# Patient Record
Sex: Female | Born: 1965 | Race: White | Hispanic: No | Marital: Married | Smoking: Never smoker
Health system: Southern US, Community
[De-identification: ages and names within clinical notes are randomized; demographics above are authoritative.]

## PROBLEM LIST (undated history)

## (undated) DIAGNOSIS — R609 Edema, unspecified: Secondary | ICD-10-CM

## (undated) DIAGNOSIS — N200 Calculus of kidney: Secondary | ICD-10-CM

## (undated) DIAGNOSIS — N281 Cyst of kidney, acquired: Secondary | ICD-10-CM

## (undated) DIAGNOSIS — I10 Essential (primary) hypertension: Secondary | ICD-10-CM

## (undated) DIAGNOSIS — R569 Unspecified convulsions: Secondary | ICD-10-CM

## (undated) DIAGNOSIS — E559 Vitamin D deficiency, unspecified: Secondary | ICD-10-CM

## (undated) DIAGNOSIS — F419 Anxiety disorder, unspecified: Secondary | ICD-10-CM

## (undated) DIAGNOSIS — F329 Major depressive disorder, single episode, unspecified: Secondary | ICD-10-CM

## (undated) DIAGNOSIS — E041 Nontoxic single thyroid nodule: Secondary | ICD-10-CM

## (undated) DIAGNOSIS — E669 Obesity, unspecified: Secondary | ICD-10-CM

## (undated) DIAGNOSIS — K219 Gastro-esophageal reflux disease without esophagitis: Secondary | ICD-10-CM

## (undated) DIAGNOSIS — M2550 Pain in unspecified joint: Secondary | ICD-10-CM

## (undated) DIAGNOSIS — J45909 Unspecified asthma, uncomplicated: Secondary | ICD-10-CM

## (undated) DIAGNOSIS — T8859XA Other complications of anesthesia, initial encounter: Secondary | ICD-10-CM

## (undated) DIAGNOSIS — J309 Allergic rhinitis, unspecified: Secondary | ICD-10-CM

## (undated) DIAGNOSIS — R112 Nausea with vomiting, unspecified: Secondary | ICD-10-CM

## (undated) DIAGNOSIS — Z87442 Personal history of urinary calculi: Secondary | ICD-10-CM

## (undated) DIAGNOSIS — G4700 Insomnia, unspecified: Secondary | ICD-10-CM

## (undated) DIAGNOSIS — R7303 Prediabetes: Secondary | ICD-10-CM

## (undated) DIAGNOSIS — C4490 Unspecified malignant neoplasm of skin, unspecified: Secondary | ICD-10-CM

## (undated) DIAGNOSIS — M797 Fibromyalgia: Secondary | ICD-10-CM

## (undated) DIAGNOSIS — Z9889 Other specified postprocedural states: Secondary | ICD-10-CM

## (undated) DIAGNOSIS — M1990 Unspecified osteoarthritis, unspecified site: Secondary | ICD-10-CM

## (undated) DIAGNOSIS — F32A Depression, unspecified: Secondary | ICD-10-CM

## (undated) DIAGNOSIS — T4145XA Adverse effect of unspecified anesthetic, initial encounter: Secondary | ICD-10-CM

## (undated) HISTORY — DX: Essential (primary) hypertension: I10

## (undated) HISTORY — DX: Calculus of kidney: N20.0

## (undated) HISTORY — DX: Cyst of kidney, acquired: N28.1

## (undated) HISTORY — DX: Edema, unspecified: R60.9

## (undated) HISTORY — DX: Unspecified convulsions: R56.9

## (undated) HISTORY — DX: Depression, unspecified: F32.A

## (undated) HISTORY — PX: INCONTINENCE SURGERY: SHX676

## (undated) HISTORY — DX: Obesity, unspecified: E66.9

## (undated) HISTORY — DX: Allergic rhinitis, unspecified: J30.9

## (undated) HISTORY — DX: Insomnia, unspecified: G47.00

## (undated) HISTORY — PX: ABDOMINAL HYSTERECTOMY: SHX81

## (undated) HISTORY — DX: Nontoxic single thyroid nodule: E04.1

## (undated) HISTORY — PX: CHOLECYSTECTOMY: SHX55

## (undated) HISTORY — PX: OTHER SURGICAL HISTORY: SHX169

## (undated) HISTORY — DX: Unspecified asthma, uncomplicated: J45.909

## (undated) HISTORY — DX: Major depressive disorder, single episode, unspecified: F32.9

## (undated) HISTORY — DX: Vitamin D deficiency, unspecified: E55.9

## (undated) HISTORY — PX: BACK SURGERY: SHX140

## (undated) HISTORY — DX: Unspecified malignant neoplasm of skin, unspecified: C44.90

## (undated) HISTORY — DX: Anxiety disorder, unspecified: F41.9

---

## 2001-07-21 ENCOUNTER — Other Ambulatory Visit: Admission: RE | Admit: 2001-07-21 | Discharge: 2001-07-21 | Payer: Self-pay | Admitting: Obstetrics and Gynecology

## 2002-05-14 ENCOUNTER — Encounter: Payer: Self-pay | Admitting: Obstetrics and Gynecology

## 2002-05-15 ENCOUNTER — Inpatient Hospital Stay (HOSPITAL_COMMUNITY): Admission: RE | Admit: 2002-05-15 | Discharge: 2002-05-16 | Payer: Self-pay | Admitting: Surgery

## 2002-06-26 ENCOUNTER — Encounter: Payer: Self-pay | Admitting: Obstetrics and Gynecology

## 2002-06-26 ENCOUNTER — Ambulatory Visit (HOSPITAL_COMMUNITY): Admission: RE | Admit: 2002-06-26 | Discharge: 2002-06-26 | Payer: Self-pay | Admitting: Obstetrics and Gynecology

## 2002-06-29 ENCOUNTER — Encounter: Payer: Self-pay | Admitting: Urology

## 2002-06-29 ENCOUNTER — Ambulatory Visit (HOSPITAL_COMMUNITY): Admission: RE | Admit: 2002-06-29 | Discharge: 2002-06-29 | Payer: Self-pay | Admitting: Urology

## 2006-05-27 ENCOUNTER — Encounter: Admission: RE | Admit: 2006-05-27 | Discharge: 2006-05-27 | Payer: Self-pay | Admitting: Obstetrics and Gynecology

## 2010-10-14 ENCOUNTER — Ambulatory Visit: Payer: Self-pay | Admitting: Family Medicine

## 2010-10-14 ENCOUNTER — Ambulatory Visit: Payer: Self-pay | Admitting: Allergy

## 2010-10-14 LAB — HM MAMMOGRAPHY

## 2012-04-20 ENCOUNTER — Emergency Department: Payer: Self-pay | Admitting: Emergency Medicine

## 2012-08-21 ENCOUNTER — Other Ambulatory Visit: Payer: Self-pay | Admitting: Family Medicine

## 2012-08-21 ENCOUNTER — Ambulatory Visit
Admission: RE | Admit: 2012-08-21 | Discharge: 2012-08-21 | Disposition: A | Discharge status: Home / Self Care | Payer: Worker's Compensation | Source: Ambulatory Visit | Attending: Family Medicine | Admitting: Family Medicine

## 2012-08-21 ENCOUNTER — Ambulatory Visit
Admission: RE | Admit: 2012-08-21 | Discharge: 2012-08-21 | Disposition: A | Discharge status: Home / Self Care | Payer: PRIVATE HEALTH INSURANCE | Source: Ambulatory Visit | Attending: Family Medicine | Admitting: Family Medicine

## 2012-08-21 DIAGNOSIS — R52 Pain, unspecified: Secondary | ICD-10-CM

## 2012-12-21 ENCOUNTER — Ambulatory Visit: Payer: Self-pay | Admitting: Internal Medicine

## 2014-01-13 DIAGNOSIS — E041 Nontoxic single thyroid nodule: Secondary | ICD-10-CM

## 2014-01-13 HISTORY — DX: Nontoxic single thyroid nodule: E04.1

## 2014-01-16 ENCOUNTER — Ambulatory Visit: Payer: Self-pay | Admitting: Family Medicine

## 2014-04-15 DIAGNOSIS — R569 Unspecified convulsions: Secondary | ICD-10-CM

## 2014-04-15 HISTORY — DX: Unspecified convulsions: R56.9

## 2014-05-31 DIAGNOSIS — C4491 Basal cell carcinoma of skin, unspecified: Secondary | ICD-10-CM

## 2014-05-31 HISTORY — DX: Basal cell carcinoma of skin, unspecified: C44.91

## 2014-06-15 DIAGNOSIS — C4490 Unspecified malignant neoplasm of skin, unspecified: Secondary | ICD-10-CM

## 2014-06-15 HISTORY — PX: SKIN CANCER EXCISION: SHX779

## 2014-06-15 HISTORY — DX: Unspecified malignant neoplasm of skin, unspecified: C44.90

## 2014-06-28 ENCOUNTER — Telehealth: Payer: Self-pay | Admitting: Obstetrics & Gynecology

## 2014-06-28 NOTE — Telephone Encounter (Signed)
Request received from [employer]: Records have been copied (in records drawer, (left side of accordion file). Per request they will contact our office for status and payment. $39.25 owed for records then will mail per request.

## 2015-04-04 ENCOUNTER — Other Ambulatory Visit: Payer: Self-pay | Admitting: Urology

## 2015-04-04 DIAGNOSIS — R3129 Other microscopic hematuria: Secondary | ICD-10-CM

## 2015-04-15 ENCOUNTER — Ambulatory Visit: Payer: Self-pay

## 2015-04-17 ENCOUNTER — Other Ambulatory Visit: Payer: Self-pay

## 2015-04-17 ENCOUNTER — Ambulatory Visit: Admission: RE | Admit: 2015-04-17 | Payer: BC Managed Care – PPO | Source: Ambulatory Visit

## 2015-04-17 ENCOUNTER — Ambulatory Visit
Admission: RE | Admit: 2015-04-17 | Discharge: 2015-04-17 | Disposition: A | Discharge status: Home / Self Care | Payer: BC Managed Care – PPO | Source: Ambulatory Visit | Attending: Urology | Admitting: Urology

## 2015-04-17 DIAGNOSIS — R3129 Other microscopic hematuria: Secondary | ICD-10-CM

## 2015-04-25 ENCOUNTER — Other Ambulatory Visit: Payer: Self-pay | Admitting: Urology

## 2015-05-08 ENCOUNTER — Telehealth: Payer: Self-pay | Admitting: Unknown Physician Specialty

## 2015-05-08 NOTE — Telephone Encounter (Signed)
E-fax came through for refill: Rx: Montelukast Copy of Rx in basket

## 2015-05-08 NOTE — Telephone Encounter (Signed)
Routing to provider. PP number is [id number].

## 2015-05-09 MED ORDER — MONTELUKAST SODIUM 10 MG PO TABS: 10.0000 mg | ORAL_TABLET | Freq: Every day | ORAL | Status: DC

## 2015-05-13 ENCOUNTER — Other Ambulatory Visit: Payer: Self-pay | Admitting: Urology

## 2015-05-21 ENCOUNTER — Telehealth: Payer: Self-pay | Admitting: Urology

## 2015-05-21 NOTE — Telephone Encounter (Signed)
Received a fax from [place] with the PT department.  She states "Patient is unable to afford the $70.00 copay that is required for physical therapy."

## 2015-06-09 ENCOUNTER — Other Ambulatory Visit: Payer: Self-pay | Admitting: Unknown Physician Specialty

## 2015-06-09 NOTE — Telephone Encounter (Signed)
Forwarded to primary

## 2015-09-08 ENCOUNTER — Other Ambulatory Visit: Payer: Self-pay | Admitting: Family Medicine

## 2015-09-08 MED ORDER — SERTRALINE HCL 50 MG PO TABS: 75.0000 mg | ORAL_TABLET | Freq: Every day | ORAL | Status: DC

## 2015-09-08 NOTE — Telephone Encounter (Signed)
Med history states she is taking 75mg , but the Rx request is for 100mg . I'm not sure which she is supposed to be on.

## 2015-09-08 NOTE — Telephone Encounter (Signed)
We tapered her dose at last visit; I updated med list

## 2015-09-17 ENCOUNTER — Telehealth: Payer: Self-pay

## 2015-09-17 MED ORDER — SERTRALINE HCL 100 MG PO TABS: 100.0000 mg | ORAL_TABLET | Freq: Every day | ORAL | Status: DC

## 2015-09-17 NOTE — Telephone Encounter (Signed)
She got her rx for Sertraline and it was only for 75mg . She said that she had only worked herself down to 100mg  and wants to know why it was lowered more.

## 2015-09-17 NOTE — Telephone Encounter (Signed)
It looks like it was a problem when we went to the new system; note from 10/24 says her dose was 75 mg but request was for 100 mg Please call her and let her know we're sorry for any confusion; went to new medical record system in June I'll be glad to continue the 100 mg strength; Rx sent

## 2015-09-17 NOTE — Telephone Encounter (Signed)
Left message to call.

## 2015-09-18 NOTE — Telephone Encounter (Signed)
Left message to call.

## 2015-09-19 NOTE — Telephone Encounter (Signed)
Patient notified

## 2015-09-29 DIAGNOSIS — G4700 Insomnia, unspecified: Secondary | ICD-10-CM | POA: Insufficient documentation

## 2015-09-29 DIAGNOSIS — F419 Anxiety disorder, unspecified: Secondary | ICD-10-CM | POA: Insufficient documentation

## 2015-09-29 DIAGNOSIS — E559 Vitamin D deficiency, unspecified: Secondary | ICD-10-CM | POA: Insufficient documentation

## 2015-09-29 DIAGNOSIS — J309 Allergic rhinitis, unspecified: Secondary | ICD-10-CM | POA: Insufficient documentation

## 2015-09-29 DIAGNOSIS — F329 Major depressive disorder, single episode, unspecified: Secondary | ICD-10-CM

## 2015-09-29 DIAGNOSIS — E669 Obesity, unspecified: Secondary | ICD-10-CM | POA: Insufficient documentation

## 2015-09-29 DIAGNOSIS — J45909 Unspecified asthma, uncomplicated: Secondary | ICD-10-CM | POA: Insufficient documentation

## 2015-10-17 ENCOUNTER — Encounter: Payer: Self-pay | Admitting: Family Medicine

## 2015-10-17 ENCOUNTER — Ambulatory Visit (INDEPENDENT_AMBULATORY_CARE_PROVIDER_SITE_OTHER): Payer: BC Managed Care – PPO | Admitting: Family Medicine

## 2015-10-17 VITALS — BP 145/85 | HR 84 | Temp 97.7°F | Ht 62.0 in | Wt 189.0 lb

## 2015-10-17 DIAGNOSIS — Z1239 Encounter for other screening for malignant neoplasm of breast: Secondary | ICD-10-CM

## 2015-10-17 DIAGNOSIS — E559 Vitamin D deficiency, unspecified: Secondary | ICD-10-CM

## 2015-10-17 DIAGNOSIS — F329 Major depressive disorder, single episode, unspecified: Secondary | ICD-10-CM | POA: Diagnosis not present

## 2015-10-17 DIAGNOSIS — Z0000 Encounter for general adult medical examination without abnormal findings: Secondary | ICD-10-CM | POA: Diagnosis not present

## 2015-10-17 DIAGNOSIS — J069 Acute upper respiratory infection, unspecified: Secondary | ICD-10-CM | POA: Diagnosis not present

## 2015-10-17 DIAGNOSIS — IMO0001 Reserved for inherently not codable concepts without codable children: Secondary | ICD-10-CM

## 2015-10-17 DIAGNOSIS — R030 Elevated blood-pressure reading, without diagnosis of hypertension: Secondary | ICD-10-CM

## 2015-10-17 DIAGNOSIS — F32A Depression, unspecified: Secondary | ICD-10-CM

## 2015-10-17 NOTE — Progress Notes (Signed)
Patient ID: Yolanda Pace, female   DOB: Aug 05, 1966, 49 y.o.   MRN: [id number]  Subjective:   Yolanda Pace is a 49 y.o. female here for a complete physical exam  Interim issues since last visit: has had a recent cold  USPSTF grade A and B recommendations Alcohol: none Depression:  Depression screen [facility] 2/9 10/17/2015  Decreased Interest 0  Down, Depressed, Hopeless 0  PHQ - 2 Score 0  controlled with medicines; she wishes to continue same dose; she tried going down and didn't feel good, stable on 100 mg daily Hypertension: high today; HTN in half-brother and half-sister; their common mother does not have high blood pressure; ate a ton of ham, Thanksgiving last week and eating leftovers; taking Dayquil since last Saturday Obesity: gaining weight; thinks it is diet and activity Tobacco use: none HIV, hep B, hep C: declined STD testing and prevention (chl/gon/syphilis): declined, asx Lipids: today Glucose: today Colorectal cancer: no family hx, colonoscopy starting at age 33 Breast cancer: no lumps, mammo ordered today; gyn does breast exams BRCA gene screening: no breast or ovarian cancer Intimate partner violence: no abuse Cervical cancer screening: s/p hyst Lung cancer: n/a Osteoporosis: n/a Fall prevention/vitamin D: recommended supplement AAA: n/a Aspirin: n/a Diet: husband is the struggle; he cooks big breakfast every morning; portions are a problem; loves oatmeal; not much fast food, but does eat out, diner near house, spaghetti Exercise: not exercising Skin cancer: no worrisome moles; saw derm a year ago; has been back; no tanning beds   Past Medical History  Diagnosis Date  . Seizures [facility]) June 2015    provoked after cervical nerve block  . Skin cancer Aug 2015    removed from back  . 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 Asthma   . Anxiety   . Depression   . Allergic rhinitis   . Insomnia   . Obesity   . Vitamin D deficiency disease    Past  Surgical History  Procedure Laterality Date  . Skin cancer excision  Aug 2015    removed from back  . Cholecystectomy    . Abdominal hysterectomy  2005ish    fibroids, ovaries remain; along with bladder tack  . Bladder tack  2005ish   Family History  Problem Relation Age of Onset  . Hyperlipidemia Mother   . Diabetes Sister   . Hyperlipidemia Sister   . Hypertension Sister   . Hypertension Brother   . Cancer Neg Hx   . COPD Neg Hx   . Heart disease Neg Hx   . Stroke Neg Hx    Social History  Substance Use Topics  . Smoking status: Never Smoker   . Smokeless tobacco: Never Used  . Alcohol Use: No   Review of Systems  Constitutional: Negative for fever and unexpected weight change.  HENT: Positive for postnasal drip, rhinorrhea, sinus pressure, sneezing, sore throat (gone now, just irritated) and tinnitus. Negative for hearing loss, mouth sores and nosebleeds.   Eyes: Negative for visual disturbance.  Respiratory: Positive for cough and wheezing (uses inhaler, not bad this time).   Gastrointestinal: Negative for diarrhea, constipation and blood in stool.  Endocrine: Positive for heat intolerance. Negative for cold intolerance and polydipsia.  Genitourinary: Positive for urgency and frequency (had bladder sling which has dropped, has seen urologist). Negative for dysuria and hematuria.  Musculoskeletal: Positive for joint swelling (left pinky at DIP, swelling). Negative for arthralgias.  Allergic/Immunologic: Negative for  food allergies.  Neurological: Negative for tremors.  Hematological: Negative for adenopathy. Does not bruise/bleed easily.  Psychiatric/Behavioral: Negative for dysphoric mood.    Objective:   Filed Vitals:   10/17/15 1515  BP: 145/85  Pulse: 84  Temp: 97.7 F (36.5 C)  Height: _0  (1.575 m)  Weight: 189 lb (85.73 kg)  SpO2: 97%   Body mass index is 34.56 kg/(m^2). Wt Readings from Last 3 Encounters:  10/17/15 189 lb (85.73 kg)  01/06/15 187  lb (84.823 kg)   Physical Exam  Constitutional: She appears well-developed and well-nourished. No distress.  obese  HENT:  Head: Normocephalic and atraumatic.  Right Ear: Hearing, tympanic membrane, external ear and ear canal normal.  Left Ear: Hearing, tympanic membrane, external ear and ear canal normal.  Nose: Rhinorrhea present. No mucosal edema.  Mouth/Throat: Oropharynx is clear and moist and mucous membranes are normal.  Eyes: EOM are normal. No scleral icterus.  Neck: No thyromegaly present.  Cardiovascular: Normal rate, regular rhythm and normal heart sounds.   No murmur heard. Pulmonary/Chest: Effort normal and breath sounds normal. No respiratory distress. She has no wheezes.  Abdominal: Soft. Bowel sounds are normal. She exhibits no distension.  Musculoskeletal: Normal range of motion. She exhibits no edema.       Left hand: She exhibits deformity (some swelling (hard) at the DIP left 5th digit).  Lymphadenopathy:    She has no cervical adenopathy.  Neurological: She is alert. She displays no tremor. She exhibits normal muscle tone.  Reflex Scores:      Patellar reflexes are 2+ on the right side and 2+ on the left side. Skin: Skin is warm and dry. She is not diaphoretic. No pallor.  Psychiatric: She has a normal mood and affect. Her behavior is normal. Judgment and thought content normal.    Assessment/Plan:   Problem List Items Addressed This Visit      Other   Depression    wellc-ontrolled with current meds; PHQ-2 score of 0 today      Vitamin D deficiency disease    Check level today and supplement if needed      Relevant Orders   VITAMIN D 25 Hydroxy (Vit-D Deficiency, Fractures) (Completed)   Breast cancer screening    Breast exams done by GYN, but I ordered the mammogram; patient to call for her own appt      Relevant Orders   MM DIGITAL SCREENING BILATERAL   Preventative health care - Primary    USPSTF grade A and B recommendations reviewed with  patient; age-appropriate recommendations, preventive care, screening tests, etc discussed and encouraged; healthy living encouraged; see AVS for patient education given to patient; GYN care through gynecologist      Relevant Orders   Lipid Panel w/o Chol/HDL Ratio (Completed)   CBC with Differential/Platelet (Completed)   Comprehensive metabolic panel (Completed)   TSH (Completed)   Elevated blood pressure    Encouraged DASH guidelines; modest weight loss would help as well; see AVS; avoid decongestants as well       Other Visit Diagnoses    Upper respiratory infection, viral        explained viral; no need for antibiotics; rest, hydration, time       Meds ordered this encounter  Medications  . albuterol (PROVENTIL) (2.5 MG/3ML) 0.083% nebulizer solution    Sig: Inhale 2.5 mg into the lungs.  Marland Kitchen omeprazole (PRILOSEC) 20 MG capsule    Sig: Take 20 mg by mouth daily.  Marland Kitchen  ibuprofen (ADVIL,MOTRIN) 800 MG tablet    Sig: Take 800 mg by mouth daily as needed.   Orders Placed This Encounter  Procedures  . MM DIGITAL SCREENING BILATERAL    Standing Status: Future     Number of Occurrences:      Standing Expiration Date: 10/16/2016    Order Specific Question:  Reason for Exam (SYMPTOM  OR DIAGNOSIS REQUIRED)    Answer:  screening    Order Specific Question:  Is the patient pregnant?    Answer:  No    Order Specific Question:  Preferred imaging location?    Answer:  [facility]  . Lipid Panel w/o Chol/HDL Ratio    Order Specific Question:  Has the patient fasted?    Answer:  Yes  . CBC with Differential/Platelet  . Comprehensive metabolic panel    Order Specific Question:  Has the patient fasted?    Answer:  Yes  . TSH  . VITAMIN D 25 Hydroxy (Vit-D Deficiency, Fractures)    Follow up plan: Return in about 1 year (around 10/16/2016) for complete physical.  An after-visit summary was printed and given to the patient at [facility].  Please see the patient instructions which  may contain other information and recommendations beyond what is mentioned above in the assessment and plan.  Orders Placed This Encounter  Procedures  . MM DIGITAL SCREENING BILATERAL  . Lipid Panel w/o Chol/HDL Ratio  . CBC with Differential/Platelet  . Comprehensive metabolic panel  . TSH  . VITAMIN D 25 Hydroxy (Vit-D Deficiency, Fractures)

## 2015-10-17 NOTE — Patient Instructions (Addendum)
Please do call to schedule your mammogram; the number to schedule one at either [facility] or [facility] is [phone] Your goal blood pressure is less than 140 mmHg on top and less than 90 on the bottom Try to follow the DASH guidelines (DASH stands for Dietary Approaches to Stop Hypertension) Try to limit the sodium in your diet.  Ideally, consume less than 1.5 grams (less than 1,553m) per day. Do not add salt when cooking or at the table.  Check the sodium amount on labels when shopping, and choose items lower in sodium when given a choice. Avoid or limit foods that already contain a lot of sodium. Eat a diet rich in fruits and vegetables and whole grains. Monitor your blood pressure a few times over the coming weeks and call if not controlled Try to use PLAIN allergy medicine without the decongestant Avoid: phenylephrine, phenylpropanolamine, and pseudoephredine If you need something for aches or pains, try to use Tylenol (acetaminphen) instead of non-steroidals (which include Aleve, ibuprofen, Advil, Motrin, and naproxen); non-steroidals can cause long-term kidney damage and raise blood pressure I do recommend a supplemental vitamin D3 1000 iu daily [facility] your 50[facility] and we'll be glad to schedule your colonoscopy  Health Maintenance, Female Adopting a healthy lifestyle and getting preventive care can go a long way to promote health and wellness. Talk with your health care provider about what schedule of regular examinations is right for you. This is a good chance for you to check in with your provider about disease prevention and staying healthy. In between checkups, there are plenty of things you can do on your own. Experts have done a lot of research about which lifestyle changes and preventive measures are most likely to keep you healthy. Ask your health care provider for more information. WEIGHT AND DIET  Eat a healthy diet  Be sure to  include plenty of vegetables, fruits, low-fat dairy products, and lean protein.  Do not eat a lot of foods high in solid fats, added sugars, or salt.  Get regular exercise. This is one of the most important things you can do for your health.  Most adults should exercise for at least 150 minutes each week. The exercise should increase your heart rate and make you sweat (moderate-intensity exercise).  Most adults should also do strengthening exercises at least twice a week. This is in addition to the moderate-intensity exercise.  Maintain a healthy weight  Body mass index (BMI) is a measurement that can be used to identify possible weight problems. It estimates body fat based on height and weight. Your health care provider can help determine your BMI and help you achieve or maintain a healthy weight.  For females 211years of age and older:   A BMI below 18.5 is considered underweight.  A BMI of 18.5 to 24.9 is normal.  A BMI of 25 to 29.9 is considered overweight.  A BMI of 30 and above is considered obese.  Watch levels of cholesterol and blood lipids  You should start having your blood tested for lipids and cholesterol at 49years of age, then have this test every 5 years.  You may need to have your cholesterol levels checked more often if:  Your lipid or cholesterol levels are high.  You are older than 49years of age.  You are at high risk for heart disease.  CANCER SCREENING   Lung Cancer  Lung cancer screening is recommended for  adults 48-14 years old who are at high risk for lung cancer because of a history of smoking.  A yearly low-dose CT scan of the lungs is recommended for people who:  Currently smoke.  Have quit within the past 15 years.  Have at least a 30-pack-year history of smoking. A pack year is smoking an average of one pack of cigarettes a day for 1 year.  Yearly screening should continue until it has been 15 years since you quit.  Yearly  screening should stop if you develop a health problem that would prevent you from having lung cancer treatment.  Breast Cancer  Practice breast self-awareness. This means understanding how your breasts normally appear and feel.  It also means doing regular breast self-exams. Let your health care provider know about any changes, no matter how small.  If you are in your 20s or 30s, you should have a clinical breast exam (CBE) by a health care provider every 1-3 years as part of a regular health exam.  If you are 56 or older, have a CBE every year. Also consider having a breast X-ray (mammogram) every year.  If you have a family history of breast cancer, talk to your health care provider about genetic screening.  If you are at high risk for breast cancer, talk to your health care provider about having an MRI and a mammogram every year.  Breast cancer gene (BRCA) assessment is recommended for women who have family members with BRCA-related cancers. BRCA-related cancers include:  Breast.  Ovarian.  Tubal.  Peritoneal cancers.  Results of the assessment will determine the need for genetic counseling and BRCA1 and BRCA2 testing. Cervical Cancer Your health care provider may recommend that you be screened regularly for cancer of the pelvic organs (ovaries, uterus, and vagina). This screening involves a pelvic examination, including checking for microscopic changes to the surface of your cervix (Pap test). You may be encouraged to have this screening done every 3 years, beginning at age 84.  For women ages 72-65, health care providers may recommend pelvic exams and Pap testing every 3 years, or they may recommend the Pap and pelvic exam, combined with testing for human papilloma virus (HPV), every 5 years. Some types of HPV increase your risk of cervical cancer. Testing for HPV may also be done on women of any age with unclear Pap test results.  Other health care providers may not recommend  any screening for nonpregnant women who are considered low risk for pelvic cancer and who do not have symptoms. Ask your health care provider if a screening pelvic exam is right for you.  If you have had past treatment for cervical cancer or a condition that could lead to cancer, you need Pap tests and screening for cancer for at least 20 years after your treatment. If Pap tests have been discontinued, your risk factors (such as having a new sexual partner) need to be reassessed to determine if screening should resume. Some women have medical problems that increase the chance of getting cervical cancer. In these cases, your health care provider may recommend more frequent screening and Pap tests. Colorectal Cancer  This type of cancer can be detected and often prevented.  Routine colorectal cancer screening usually begins at 49 years of age and continues through 49 years of age.  Your health care provider may recommend screening at an earlier age if you have risk factors for colon cancer.  Your health care provider may also recommend using home  test kits to check for hidden blood in the stool.  A small camera at the end of a tube can be used to examine your colon directly (sigmoidoscopy or colonoscopy). This is done to check for the earliest forms of colorectal cancer.  Routine screening usually begins at age 8.  Direct examination of the colon should be repeated every 5-10 years through 49 years of age. However, you may need to be screened more often if early forms of precancerous polyps or small growths are found. Skin Cancer  Check your skin from head to toe regularly.  Tell your health care provider about any new moles or changes in moles, especially if there is a change in a mole's shape or color.  Also tell your health care provider if you have a mole that is larger than the size of a pencil eraser.  Always use sunscreen. Apply sunscreen liberally and repeatedly throughout the  day.  Protect yourself by wearing long sleeves, pants, a wide-brimmed hat, and sunglasses whenever you are outside. HEART DISEASE, DIABETES, AND HIGH BLOOD PRESSURE   High blood pressure causes heart disease and increases the risk of stroke. High blood pressure is more likely to develop in:  People who have blood pressure in the high end of the normal range (130-139/85-89 mm Hg).  People who are overweight or obese.  People who are African American.  If you are 54-18 years of age, have your blood pressure checked every 3-5 years. If you are 24 years of age or older, have your blood pressure checked every year. You should have your blood pressure measured twice--once when you are at a hospital or clinic, and once when you are not at a hospital or clinic. Record the average of the two measurements. To check your blood pressure when you are not at a hospital or clinic, you can use:  An automated blood pressure machine at a pharmacy.  A home blood pressure monitor.  If you are between 77 years and 55 years old, ask your health care provider if you should take aspirin to prevent strokes.  Have regular diabetes screenings. This involves taking a blood sample to check your fasting blood sugar level.  If you are at a normal weight and have a low risk for diabetes, have this test once every three years after 49 years of age.  If you are overweight and have a high risk for diabetes, consider being tested at a younger age or more often. PREVENTING INFECTION  Hepatitis B  If you have a higher risk for hepatitis B, you should be screened for this virus. You are considered at high risk for hepatitis B if:  You were born in a country where hepatitis B is common. Ask your health care provider which countries are considered high risk.  Your parents were born in a high-risk country, and you have not been immunized against hepatitis B (hepatitis B vaccine).  You have HIV or AIDS.  You use needles  to inject street drugs.  You live with someone who has hepatitis B.  You have had sex with someone who has hepatitis B.  You get hemodialysis treatment.  You take certain medicines for conditions, including cancer, organ transplantation, and autoimmune conditions. Hepatitis C  Blood testing is recommended for:  Everyone born from [birth year] through 1965.  Anyone with known risk factors for hepatitis C. Sexually transmitted infections (STIs)  You should be screened for sexually transmitted infections (STIs) including gonorrhea and chlamydia if:  You are sexually active and are younger than 49 years of age.  You are older than 49 years of age and your health care provider tells you that you are at risk for this type of infection.  Your sexual activity has changed since you were last screened and you are at an increased risk for chlamydia or gonorrhea. Ask your health care provider if you are at risk.  If you do not have HIV, but are at risk, it may be recommended that you take a prescription medicine daily to prevent HIV infection. This is called pre-exposure prophylaxis (PrEP). You are considered at risk if:  You are sexually active and do not regularly use condoms or know the HIV status of your partner(s).  You take drugs by injection.  You are sexually active with a partner who has HIV. Talk with your health care provider about whether you are at high risk of being infected with HIV. If you choose to begin PrEP, you should first be tested for HIV. You should then be tested every 3 months for as long as you are taking PrEP.  PREGNANCY   If you are premenopausal and you may become pregnant, ask your health care provider about preconception counseling.  If you may become pregnant, take 400 to 800 micrograms (mcg) of folic acid every day.  If you want to prevent pregnancy, talk to your health care provider about birth control (contraception). OSTEOPOROSIS AND MENOPAUSE    Osteoporosis is a disease in which the bones lose minerals and strength with aging. This can result in serious bone fractures. Your risk for osteoporosis can be identified using a bone density scan.  If you are 72 years of age or older, or if you are at risk for osteoporosis and fractures, ask your health care provider if you should be screened.  Ask your health care provider whether you should take a calcium or vitamin D supplement to lower your risk for osteoporosis.  Menopause may have certain physical symptoms and risks.  Hormone replacement therapy may reduce some of these symptoms and risks. Talk to your health care provider about whether hormone replacement therapy is right for you.  HOME CARE INSTRUCTIONS   Schedule regular health, dental, and eye exams.  Stay current with your immunizations.   Do not use any tobacco products including cigarettes, chewing tobacco, or electronic cigarettes.  If you are pregnant, do not drink alcohol.  If you are breastfeeding, limit how much and how often you drink alcohol.  Limit alcohol intake to no more than 1 drink per day for nonpregnant women. One drink equals 12 ounces of beer, 5 ounces of wine, or 1 ounces of hard liquor.  Do not use street drugs.  Do not share needles.  Ask your health care provider for help if you need support or information about quitting drugs.  Tell your health care provider if you often feel depressed.  Tell your health care provider if you have ever been abused or do not feel safe at home.   This information is not intended to replace advice given to you by your health care provider. Make sure you discuss any questions you have with your health care provider.   Document Released: 05/17/2011 Document Revised: 11/22/2014 Document Reviewed: 10/03/2013 Elsevier Interactive Patient Education [employer].

## 2015-10-17 NOTE — Assessment & Plan Note (Addendum)
Check level today and supplement if needed 

## 2015-10-18 DIAGNOSIS — Z0000 Encounter for general adult medical examination without abnormal findings: Principal | ICD-10-CM

## 2015-10-18 DIAGNOSIS — R030 Elevated blood-pressure reading, without diagnosis of hypertension: Secondary | ICD-10-CM

## 2015-10-18 LAB — COMPREHENSIVE METABOLIC PANEL
ALT: 26 IU/L (ref 0–32)
AST: 19 IU/L (ref 0–40)
Albumin/Globulin Ratio: 2.2 (ref 1.1–2.5)
Albumin: 4.3 g/dL (ref 3.5–5.5)
Alkaline Phosphatase: 81 IU/L (ref 39–117)
BUN/Creatinine Ratio: 25 — ABNORMAL HIGH (ref 9–23)
BUN: 13 mg/dL (ref 6–24)
Bilirubin Total: 0.3 mg/dL (ref 0.0–1.2)
CALCIUM: 9.3 mg/dL (ref 8.7–10.2)
CO2: 25 mmol/L (ref 18–29)
CREATININE: 0.53 mg/dL — AB (ref 0.57–1.00)
Chloride: 102 mmol/L (ref 97–106)
GFR, EST AFRICAN AMERICAN: 129 mL/min/{1.73_m2} (ref >59)
GFR, EST NON AFRICAN AMERICAN: 112 mL/min/{1.73_m2} (ref >59)
GLOBULIN, TOTAL: 2 g/dL (ref 1.5–4.5)
Glucose: 95 mg/dL (ref 65–99)
Potassium: 4.5 mmol/L (ref 3.5–5.2)
SODIUM: 141 mmol/L (ref 136–144)
TOTAL PROTEIN: 6.3 g/dL (ref 6.0–8.5)

## 2015-10-18 LAB — CBC WITH DIFFERENTIAL/PLATELET
BASOS: 1 %
Basophils Absolute: 0.1 10*3/uL (ref 0.0–0.2)
EOS (ABSOLUTE): 0.3 10*3/uL (ref 0.0–0.4)
EOS: 3 %
HEMATOCRIT: 38.5 % (ref 34.0–46.6)
HEMOGLOBIN: 12.8 g/dL (ref 11.1–15.9)
IMMATURE GRANS (ABS): 0 10*3/uL (ref 0.0–0.1)
IMMATURE GRANULOCYTES: 0 %
LYMPHS: 35 %
Lymphocytes Absolute: 3 10*3/uL (ref 0.7–3.1)
MCH: 29.4 pg (ref 26.6–33.0)
MCHC: 33.2 g/dL (ref 31.5–35.7)
MCV: 88 fL (ref 79–97)
MONOCYTES: 7 %
MONOS ABS: 0.6 10*3/uL (ref 0.1–0.9)
NEUTROS PCT: 54 %
Neutrophils Absolute: 4.6 10*3/uL (ref 1.4–7.0)
Platelets: 333 10*3/uL (ref 150–379)
RBC: 4.36 x10E6/uL (ref 3.77–5.28)
RDW: 13.2 % (ref 12.3–15.4)
WBC: 8.6 10*3/uL (ref 3.4–10.8)

## 2015-10-18 LAB — LIPID PANEL W/O CHOL/HDL RATIO
Cholesterol, Total: 191 mg/dL (ref 100–199)
HDL: 41 mg/dL (ref >39)
LDL Calculated: 118 mg/dL — ABNORMAL HIGH (ref 0–99)
Triglycerides: 160 mg/dL — ABNORMAL HIGH (ref 0–149)
VLDL Cholesterol Cal: 32 mg/dL (ref 5–40)

## 2015-10-18 LAB — VITAMIN D 25 HYDROXY (VIT D DEFICIENCY, FRACTURES): VIT D 25 HYDROXY: 21.5 ng/mL — AB (ref 30.0–100.0)

## 2015-10-18 LAB — TSH: TSH: 1.65 u[IU]/mL (ref 0.450–4.500)

## 2015-10-18 NOTE — Assessment & Plan Note (Signed)
Breast exams done by GYN, but I ordered the mammogram; patient to call for her own appt

## 2015-10-18 NOTE — Assessment & Plan Note (Signed)
wellc-ontrolled with current meds; PHQ-2 score of 0 today

## 2015-10-18 NOTE — Assessment & Plan Note (Addendum)
Encouraged DASH guidelines; modest weight loss would help as well; see AVS; avoid decongestants as well

## 2015-10-18 NOTE — Assessment & Plan Note (Signed)
USPSTF grade A and B recommendations reviewed with patient; age-appropriate recommendations, preventive care, screening tests, etc discussed and encouraged; healthy living encouraged; see AVS for patient education given to patient GYN care through gynecologist 

## 2015-10-25 ENCOUNTER — Encounter: Payer: Self-pay | Admitting: Family Medicine

## 2015-11-13 ENCOUNTER — Ambulatory Visit
Admission: RE | Admit: 2015-11-13 | Discharge: 2015-11-13 | Disposition: A | Discharge status: Home / Self Care | Payer: BC Managed Care – PPO | Source: Ambulatory Visit | Attending: Family Medicine | Admitting: Family Medicine

## 2015-11-13 DIAGNOSIS — Z1231 Encounter for screening mammogram for malignant neoplasm of breast: Secondary | ICD-10-CM | POA: Insufficient documentation

## 2015-11-13 DIAGNOSIS — Z1239 Encounter for other screening for malignant neoplasm of breast: Secondary | ICD-10-CM

## 2015-12-11 ENCOUNTER — Other Ambulatory Visit: Payer: Self-pay | Admitting: Family Medicine

## 2015-12-12 NOTE — Telephone Encounter (Signed)
Last visit reviewed; Rx approved

## 2015-12-30 ENCOUNTER — Ambulatory Visit (INDEPENDENT_AMBULATORY_CARE_PROVIDER_SITE_OTHER): Payer: BC Managed Care – PPO | Admitting: Unknown Physician Specialty

## 2015-12-30 ENCOUNTER — Encounter: Payer: Self-pay | Admitting: Unknown Physician Specialty

## 2015-12-30 VITALS — BP 132/85 | HR 71 | Temp 98.2°F | Ht 62.0 in | Wt 189.0 lb

## 2015-12-30 DIAGNOSIS — K589 Irritable bowel syndrome without diarrhea: Secondary | ICD-10-CM

## 2015-12-30 DIAGNOSIS — K30 Functional dyspepsia: Secondary | ICD-10-CM

## 2015-12-30 DIAGNOSIS — R109 Unspecified abdominal pain: Secondary | ICD-10-CM

## 2015-12-30 MED ORDER — HYOSCYAMINE SULFATE 0.125 MG SL SUBL: 0.1250 mg | SUBLINGUAL_TABLET | SUBLINGUAL | Status: DC | PRN

## 2015-12-30 NOTE — Progress Notes (Signed)
----------  BP 132/85 mmHg  Pulse 71  Temp(Src) 98.2 F (36.8 C)  Ht 5\' 2"  (1.575 m)  Wt 189 lb (85.73 kg)  BMI 34.56 kg/m2  SpO2 98%   Subjective:    Patient ID: Yolanda Pace, female    DOB: 1966/06/06, 50 y.o.   MRN: [id number]  HPI: Yolanda Pace is a 50 y.o. female  Chief Complaint  Patient presents with  . GI Problem    pt states for about 4 months now, she would go to the bathroom and then have to go again right away. States this happens for about a week at the time, goes away, and then comes back.    Pt with urgency with stools that seems to come and go.  States episodes last for about a week of pain in stomach relieved by diarrhea.  This alternates with constipation.  She states it started the week her daughter had her baby.  No change in diet.  She takes Omeprazol 20 mg.  She is due for her colonoscopy as she will be 50 soon.  She has had a hysterectomy  Relevant past medical, surgical, family and social history reviewed and updated as indicated. Interim medical history since our last visit reviewed. Allergies and medications reviewed and updated.  Review of Systems  Constitutional: Negative.   HENT: Negative.   Eyes: Negative.   Respiratory: Negative.   Cardiovascular: Negative.   Gastrointestinal: Negative.   Endocrine: Negative.   Musculoskeletal: Negative.   Skin: Negative.   Psychiatric/Behavioral: Negative.     Per HPI unless specifically indicated above     Objective:    BP 132/85 mmHg  Pulse 71  Temp(Src) 98.2 F (36.8 C)  Ht 5\' 2"  (1.575 m)  Wt 189 lb (85.73 kg)  BMI 34.56 kg/m2  SpO2 98%  Wt Readings from Last 3 Encounters:  12/30/15 189 lb (85.73 kg)  10/17/15 189 lb (85.73 kg)  01/06/15 187 lb (84.823 kg)    Physical Exam  Constitutional: She is oriented to person, place, and time. She appears well-developed and well-nourished. No distress.  HENT:  Head: Normocephalic and atraumatic.  Eyes: Conjunctivae and lids are normal. Right eye  exhibits no discharge. Left eye exhibits no discharge. No scleral icterus.  Neck: Normal range of motion. Neck supple. No JVD present. Carotid bruit is not present.  Cardiovascular: Normal rate, regular rhythm and normal heart sounds.   Pulmonary/Chest: Effort normal and breath sounds normal.  Abdominal: Soft. Normal appearance. She exhibits no distension. There is no splenomegaly or hepatomegaly. There is no tenderness.  Musculoskeletal: Normal range of motion.  Neurological: She is alert and oriented to person, place, and time.  Skin: Skin is warm, dry and intact. No rash noted. No pallor.  Psychiatric: She has a normal mood and affect. Her behavior is normal. Judgment and thought content normal.    Assessment & Plan:   Problem List Items Addressed This Visit    None    Visit Diagnoses    Stomach pain    -  Primary    Relevant Orders    UA/M w/rflx Culture, Routine    Ambulatory referral to Gastroenterology    CBC with Differential/Platelet    Comprehensive metabolic panel    IBS (irritable bowel syndrome)        Pt ed on Levsin.  Take Align.  Pt ed on diet.      Relevant Medications    hyoscyamine (LEVSIN SL) 0.125 MG SL tablet  Other Relevant Orders    Ambulatory referral to Gastroenterology    CBC with Differential/Platelet    Comprehensive metabolic panel       Refer to GI for further management  Follow up plan: Return if symptoms worsen or fail to improve.

## 2015-12-30 NOTE — Patient Instructions (Addendum)
Irritable Bowel Syndrome, Adult Irritable bowel syndrome (IBS) is not one specific disease. It is a group of symptoms that affects the organs responsible for digestion (gastrointestinal or GI tract).  To regulate how your GI tract works, your body sends signals back and forth between your intestines and your brain. If you have IBS, there may be a problem with these signals. As a result, your GI tract does not function normally. Your intestines may become more sensitive and overreact to certain things. This is especially true when you eat certain foods or when you are under stress.  There are four types of IBS. These may be determined based on the consistency of your stool:   IBS with diarrhea.   IBS with constipation.   Mixed IBS.   Unsubtyped IBS.  It is important to know which type of IBS you have. Some treatments are more likely to be helpful for certain types of IBS.  CAUSES  The exact cause of IBS is not known. RISK FACTORS You may have a higher risk of IBS if:  You are a woman.  You are younger than 50 years old.  You have a family history of IBS.  You have mental health problems.  You have had bacterial infection of your GI tract. SIGNS AND SYMPTOMS  Symptoms of IBS vary from person to person. The main symptom is abdominal pain or discomfort. Additional symptoms usually include one or more of the following:   Diarrhea, constipation, or both.   Abdominal swelling or bloating.   Feeling full or sick after eating a small or regular-size meal.   Frequent gas.   Mucus in the stool.   A feeling of having more stool left after a bowel movement.  Symptoms tend to come and go. They may be associated with stress, psychiatric conditions, or nothing at all.  DIAGNOSIS  There is no specific test to diagnose IBS. Your health care provider will make a diagnosis based on a physical exam, medical history, and your symptoms. You may have other tests to rule out other  conditions that may be causing your symptoms. These may include:   Blood tests.   X-rays.   CT scan.  Endoscopy and colonoscopy. This is a test in which your GI tract is viewed with a long, thin, flexible tube. TREATMENT There is no cure for IBS, but treatment can help relieve symptoms. IBS treatment often includes:   Changes to your diet, such as:  Eating more fiber.  Avoiding foods that cause symptoms.  Drinking more water.  Eating regular, medium-sized portioned meals.  Medicines. These may include:  Fiber supplements if you have constipation.  Medicine to control diarrhea (antidiarrheal medicines).  Medicine to help control muscle spasms in your GI tract (antispasmodic medicines).  Medicines to help with any mental health issues, such as antidepressants or tranquilizers.  Therapy.  Talk therapy may help with anxiety, depression, or other mental health issues that can make IBS symptoms worse.  Stress reduction.  Managing your stress can help keep symptoms under control. HOME CARE INSTRUCTIONS   Take medicines only as directed by your health care provider.  Eat a healthy diet.  Avoid foods and drinks with added sugar.  Include more whole grains, fruits, and vegetables gradually into your diet. This may be especially helpful if you have IBS with constipation.  Avoid any foods and drinks that make your symptoms worse. These may include dairy products and caffeinated or carbonated drinks.  Do not eat large meals.    Drink enough fluid to keep your urine clear or pale yellow.  Exercise regularly. Ask your health care provider for recommendations of good activities for you.  Keep all follow-up visits as directed by your health care provider. This is important. SEEK MEDICAL CARE IF:   You have constant pain.  You have trouble or pain with swallowing.  You have worsening diarrhea. SEEK IMMEDIATE MEDICAL CARE IF:   You have severe and worsening abdominal  pain.   You have diarrhea and:   You have a rash, stiff neck, or severe headache.   You are irritable, sleepy, or difficult to awaken.   You are weak, dizzy, or extremely thirsty.   You have bright red blood in your stool or you have black tarry stools.   You have unusual abdominal swelling that is painful.   You vomit continuously.   You vomit blood (hematemesis).   You have both abdominal pain and a fever.    This information is not intended to replace advice given to you by your health care provider. Make sure you discuss any questions you have with your health care provider.   Document Released: 11/01/2005 Document Revised: 11/22/2014 Document Reviewed: 07/19/2014 Elsevier Interactive Patient Education 2016 [employer].  Try taking [employer]

## 2015-12-31 ENCOUNTER — Encounter: Payer: Self-pay | Admitting: Unknown Physician Specialty

## 2015-12-31 ENCOUNTER — Other Ambulatory Visit: Payer: Self-pay | Admitting: Family Medicine

## 2015-12-31 LAB — UA/M W/RFLX CULTURE, ROUTINE
Bilirubin, UA: NEGATIVE
GLUCOSE, UA: NEGATIVE
KETONES UA: NEGATIVE
Leukocytes, UA: NEGATIVE
NITRITE UA: NEGATIVE
Protein, UA: NEGATIVE
UUROB: 0.2 mg/dL (ref 0.2–1.0)
pH, UA: 5 (ref 5.0–7.5)

## 2015-12-31 LAB — CBC WITH DIFFERENTIAL/PLATELET
BASOS: 1 %
Basophils Absolute: 0.1 10*3/uL (ref 0.0–0.2)
EOS (ABSOLUTE): 0.3 10*3/uL (ref 0.0–0.4)
EOS: 3 %
HEMATOCRIT: 39.9 % (ref 34.0–46.6)
HEMOGLOBIN: 13.2 g/dL (ref 11.1–15.9)
IMMATURE GRANULOCYTES: 0 %
Immature Grans (Abs): 0 10*3/uL (ref 0.0–0.1)
LYMPHS ABS: 3.4 10*3/uL — AB (ref 0.7–3.1)
Lymphs: 43 %
MCH: 29.6 pg (ref 26.6–33.0)
MCHC: 33.1 g/dL (ref 31.5–35.7)
MCV: 90 fL (ref 79–97)
MONOS ABS: 0.6 10*3/uL (ref 0.1–0.9)
Monocytes: 7 %
NEUTROS ABS: 3.5 10*3/uL (ref 1.4–7.0)
Neutrophils: 46 %
Platelets: 285 10*3/uL (ref 150–379)
RBC: 4.46 x10E6/uL (ref 3.77–5.28)
RDW: 13 % (ref 12.3–15.4)
WBC: 7.8 10*3/uL (ref 3.4–10.8)

## 2015-12-31 LAB — COMPREHENSIVE METABOLIC PANEL
A/G RATIO: 1.9 (ref 1.1–2.5)
ALBUMIN: 4.4 g/dL (ref 3.5–5.5)
ALK PHOS: 83 IU/L (ref 39–117)
ALT: 33 IU/L — ABNORMAL HIGH (ref 0–32)
AST: 24 IU/L (ref 0–40)
BUN / CREAT RATIO: 20 (ref 9–23)
BUN: 14 mg/dL (ref 6–24)
Bilirubin Total: 0.2 mg/dL (ref 0.0–1.2)
CO2: 24 mmol/L (ref 18–29)
CREATININE: 0.7 mg/dL (ref 0.57–1.00)
Calcium: 10.1 mg/dL (ref 8.7–10.2)
Chloride: 102 mmol/L (ref 96–106)
GFR calc Af Amer: 118 mL/min/{1.73_m2} (ref >59)
GFR, EST NON AFRICAN AMERICAN: 102 mL/min/{1.73_m2} (ref >59)
GLOBULIN, TOTAL: 2.3 g/dL (ref 1.5–4.5)
Glucose: 80 mg/dL (ref 65–99)
Potassium: 4.5 mmol/L (ref 3.5–5.2)
SODIUM: 142 mmol/L (ref 134–144)
Total Protein: 6.7 g/dL (ref 6.0–8.5)

## 2015-12-31 LAB — MICROSCOPIC EXAMINATION

## 2015-12-31 NOTE — Progress Notes (Signed)
Quick Note:  Normal labs. Patient notified by letter. ______ 

## 2016-01-01 NOTE — Telephone Encounter (Signed)
Patient seen by me in December; was seen by colleague this week for acute visit [place] reviewed Fills over the last year have been on February 04, 2015 and June 11, 2015 Rx approved

## 2016-04-15 ENCOUNTER — Ambulatory Visit: Payer: BC Managed Care – PPO | Admitting: Family Medicine

## 2016-04-25 ENCOUNTER — Other Ambulatory Visit: Payer: Self-pay | Admitting: Family Medicine

## 2016-04-27 ENCOUNTER — Other Ambulatory Visit: Payer: Self-pay | Admitting: Family Medicine

## 2016-04-28 NOTE — Telephone Encounter (Signed)
Pt states is coming to you

## 2016-04-28 NOTE — Telephone Encounter (Signed)
I just approved 30+0 on June 12th; please find out if patient coming here or staying at [facility]

## 2016-05-06 ENCOUNTER — Other Ambulatory Visit: Payer: Self-pay | Admitting: Unknown Physician Specialty

## 2016-05-06 NOTE — Telephone Encounter (Signed)
Your patient.  Thanks 

## 2016-05-07 ENCOUNTER — Ambulatory Visit (INDEPENDENT_AMBULATORY_CARE_PROVIDER_SITE_OTHER): Payer: BC Managed Care – PPO | Admitting: Family Medicine

## 2016-05-07 ENCOUNTER — Encounter: Payer: Self-pay | Admitting: Family Medicine

## 2016-05-07 VITALS — BP 132/84 | HR 86 | Temp 98.6°F | Resp 14 | Wt 191.0 lb

## 2016-05-07 DIAGNOSIS — F329 Major depressive disorder, single episode, unspecified: Secondary | ICD-10-CM | POA: Diagnosis not present

## 2016-05-07 DIAGNOSIS — J309 Allergic rhinitis, unspecified: Secondary | ICD-10-CM | POA: Diagnosis not present

## 2016-05-07 DIAGNOSIS — R3129 Other microscopic hematuria: Secondary | ICD-10-CM | POA: Diagnosis not present

## 2016-05-07 DIAGNOSIS — R740 Nonspecific elevation of levels of transaminase and lactic acid dehydrogenase [LDH]: Secondary | ICD-10-CM

## 2016-05-07 DIAGNOSIS — R7401 Elevation of levels of liver transaminase levels: Secondary | ICD-10-CM

## 2016-05-07 DIAGNOSIS — E559 Vitamin D deficiency, unspecified: Secondary | ICD-10-CM | POA: Diagnosis not present

## 2016-05-07 DIAGNOSIS — E669 Obesity, unspecified: Secondary | ICD-10-CM

## 2016-05-07 DIAGNOSIS — E041 Nontoxic single thyroid nodule: Secondary | ICD-10-CM | POA: Diagnosis not present

## 2016-05-07 DIAGNOSIS — F32A Depression, unspecified: Secondary | ICD-10-CM

## 2016-05-07 DIAGNOSIS — M2550 Pain in unspecified joint: Secondary | ICD-10-CM | POA: Diagnosis not present

## 2016-05-07 LAB — COMPLETE METABOLIC PANEL WITH GFR
ALBUMIN: 4.3 g/dL (ref 3.6–5.1)
ALK PHOS: 79 U/L (ref 33–130)
ALT: 26 U/L (ref 6–29)
AST: 18 U/L (ref 10–35)
BUN: 15 mg/dL (ref 7–25)
CALCIUM: 9.4 mg/dL (ref 8.6–10.4)
CO2: 26 mmol/L (ref 20–31)
Chloride: 104 mmol/L (ref 98–110)
Creat: 0.64 mg/dL (ref 0.50–1.05)
Glucose, Bld: 103 mg/dL — ABNORMAL HIGH (ref 65–99)
POTASSIUM: 4.5 mmol/L (ref 3.5–5.3)
Sodium: 141 mmol/L (ref 135–146)
Total Bilirubin: 0.5 mg/dL (ref 0.2–1.2)
Total Protein: 6.8 g/dL (ref 6.1–8.1)

## 2016-05-07 LAB — LIPID PANEL
CHOL/HDL RATIO: 4.6 ratio (ref <=5.0)
CHOLESTEROL: 206 mg/dL — AB (ref 125–200)
HDL: 45 mg/dL — AB (ref >=46)
LDL Cholesterol: 127 mg/dL (ref <130)
TRIGLYCERIDES: 170 mg/dL — AB (ref <150)
VLDL: 34 mg/dL — ABNORMAL HIGH (ref <30)

## 2016-05-07 LAB — C-REACTIVE PROTEIN: CRP: 0.7 mg/dL — AB (ref <0.60)

## 2016-05-07 MED ORDER — SERTRALINE HCL 100 MG PO TABS: 100.0000 mg | ORAL_TABLET | Freq: Every day | ORAL | Status: DC

## 2016-05-07 NOTE — Assessment & Plan Note (Signed)
Has not been back for [place] of her own choice, will try to go back and do this she says

## 2016-05-07 NOTE — Assessment & Plan Note (Signed)
Continue meds. 

## 2016-05-07 NOTE — Assessment & Plan Note (Signed)
Check lipids 

## 2016-05-07 NOTE — Progress Notes (Signed)
BP 132/84 mmHg  Pulse 86  Temp(Src) 98.6 F (37 C) (Oral)  Resp 14  Wt 191 lb (86.637 kg)  SpO2 94%   Subjective:    Patient ID: Yolanda Pace, female    DOB: 1966-05-01, 50 y.o.   MRN: [id number]  HPI: Yolanda Pace is a 50 y.o. female  Chief Complaint  Patient presents with  . Medication Refill  . Joint Pain   She is having a lot of joint pain; knees and hips and wrists and shoulders; no known RA in the family; not sure about father's side of the family; not on mother's No erythema or increased warmth of any joints; wrists do swell She will buy the smaller item instead of the larger item because it's easier to lift and carry Taking a lot of ibuprofen; taking 3 pills every morning and 3 more at night; gives her some relief Tylenol not helpful for this Can move better after the NSAID No fevers; having night sweats, went to gyn on Tuesday and had exam  Taking sertraline for mood; stable; no thoughts of self-harm; wants to stay on the current dose; still has two clonazepam left; only uses for sleep if pain is so bad; not thinking about that as a pain pill  Medicines for allergies; not doing well, out of singulair for a few days; not using rescue inhaler much now  2+ blood in urine in Feb, saw another provider; not seeing any blood; had MRI on bladder in [facility]  Vitamin D low at 21.5 in December; taking vit D supplement daily, 1,000 daily  Depression screen [facility] 2/9 05/07/2016 10/17/2015  Decreased Interest 0 0  Down, Depressed, Hopeless 0 0  PHQ - 2 Score 0 0   Relevant past medical, surgical, family and social history reviewed Past Medical History  Diagnosis Date  . Seizures [facility]) June 2015    provoked after cervical nerve block  . Cystic thyroid nodule March 2015    41mm on u/s  . Asthma   . Anxiety   . Depression   . Allergic rhinitis   . Insomnia   . Obesity   . Vitamin D deficiency disease   . Skin cancer Aug 2015    removed from back   Past Surgical History    Procedure Laterality Date  . Skin cancer excision  Aug 2015    removed from back  . Cholecystectomy    . Abdominal hysterectomy  2005ish    fibroids, ovaries remain; along with bladder tack  . Bladder tack  2005ish   Family History  Problem Relation Age of Onset  . Hyperlipidemia Mother   . Diabetes Sister   . Hyperlipidemia Sister   . Hypertension Sister   . Hypertension Brother   . Cancer Neg Hx   . COPD Neg Hx   . Heart disease Neg Hx   . Stroke Neg Hx    Social History  Substance Use Topics  . Smoking status: Never Smoker   . Smokeless tobacco: Never Used  . Alcohol Use: No   Interim medical history since last visit reviewed. Allergies and medications reviewed  Review of Systems Per HPI unless specifically indicated above     Objective:    BP 132/84 mmHg  Pulse 86  Temp(Src) 98.6 F (37 C) (Oral)  Resp 14  Wt 191 lb (86.637 kg)  SpO2 94%  Wt Readings from Last 3 Encounters:  05/07/16 191 lb (86.637 kg)  12/30/15 189 lb (85.73 kg)  10/17/15 189 lb (85.73 kg)   body mass index is 34.93 kg/(m^2).  Physical Exam  Constitutional: She appears well-developed and well-nourished. No distress.  obese  HENT:  Head: Normocephalic and atraumatic.  Eyes: EOM are normal. No scleral icterus.  Neck: No thyromegaly present.  Cardiovascular: Normal rate, regular rhythm and normal heart sounds.   No murmur heard. Pulmonary/Chest: Effort normal and breath sounds normal. No respiratory distress. She has no wheezes.  Abdominal: Soft. Bowel sounds are normal. She exhibits no distension.  Musculoskeletal: Normal range of motion. She exhibits no edema.       Right hand: She exhibits tenderness. She exhibits normal range of motion.       Left hand: She exhibits tenderness. She exhibits normal range of motion.  Tender to palpation over bases of thumbs; no MCP enlargement or ulnar deviation  Neurological: She is alert. She exhibits normal muscle tone.  Skin: Skin is warm and  dry. She is not diaphoretic. No pallor.  Psychiatric: She has a normal mood and affect. Her behavior is normal. Judgment and thought content normal. Her mood appears not anxious. She does not exhibit a depressed mood.  Vitals reviewed.     Assessment & Plan:   Problem List Items Addressed This Visit      Respiratory   Allergic rhinitis    Continue meds, avoid triggers when possible        Endocrine   Cystic thyroid nodule    Has not been back for [place] of her own choice, will try to go back and do this she says        Other   Depression    Continue meds      Relevant Medications   sertraline (ZOLOFT) 100 MG tablet   Obesity    Check lipids      Relevant Orders   Lipid panel (Completed)   Pain, joint, multiple sites - Primary   Relevant Orders   Antinuclear Antib (ANA) (Completed)   C-reactive protein (Completed)   Vitamin D deficiency disease    Continue supplement       Other Visit Diagnoses    Microscopic hematuria        noted on prior urine; repeat today    Relevant Orders    Urinalysis w microscopic + reflex cultur (Completed)    Elevated serum glutamic pyruvic transaminase (SGPT) level        Relevant Orders    COMPLETE METABOLIC PANEL WITH GFR (Completed)       Follow up plan: Return in about 6 months (around 11/06/2016) for regular follow-up.  An after-visit summary was printed and given to the patient at [place].  Please see the patient instructions which may contain other information and recommendations beyond what is mentioned above in the assessment and plan.  Meds ordered this encounter  Medications  . levocetirizine (XYZAL) 5 MG tablet    Sig:   . sertraline (ZOLOFT) 100 MG tablet    Sig: Take 1 tablet (100 mg total) by mouth daily.    Dispense:  90 tablet    Refill:  1    Orders Placed This Encounter  Procedures  . Urinalysis w microscopic + reflex cultur  . Antinuclear Antib (ANA)  . Lipid panel  . COMPLETE METABOLIC PANEL WITH  GFR  . C-reactive protein

## 2016-05-07 NOTE — Patient Instructions (Signed)
Okay to take 600 mg of ibuprofen up to every six hours if needed, max is 12 pills of the 200 mg strength per day = 2400 mg Always take with food or milk product We'll get labs today

## 2016-05-08 LAB — URINALYSIS W MICROSCOPIC + REFLEX CULTURE
BILIRUBIN URINE: NEGATIVE
Bacteria, UA: NONE SEEN [HPF]
CASTS: NONE SEEN [LPF]
CRYSTALS: NONE SEEN [HPF]
Glucose, UA: NEGATIVE
Hgb urine dipstick: NEGATIVE
Ketones, ur: NEGATIVE
Leukocytes, UA: NEGATIVE
Nitrite: NEGATIVE
Protein, ur: NEGATIVE
RBC / HPF: NONE SEEN RBC/HPF (ref <=2)
SPECIFIC GRAVITY, URINE: 1.023 (ref 1.001–1.035)
Squamous Epithelial / LPF: NONE SEEN [HPF] (ref <=5)
WBC UA: NONE SEEN WBC/HPF (ref <=5)
YEAST: NONE SEEN [HPF]
pH: 6.5 (ref 5.0–8.0)

## 2016-05-09 NOTE — Assessment & Plan Note (Signed)
Continue supplement. 

## 2016-05-09 NOTE — Assessment & Plan Note (Signed)
Continue meds, avoid triggers when possible

## 2016-05-10 LAB — ANA: ANA: NEGATIVE

## 2016-11-03 ENCOUNTER — Other Ambulatory Visit: Payer: Self-pay | Admitting: Family Medicine

## 2016-11-09 ENCOUNTER — Ambulatory Visit (INDEPENDENT_AMBULATORY_CARE_PROVIDER_SITE_OTHER): Payer: BC Managed Care – PPO | Admitting: Family Medicine

## 2016-11-09 ENCOUNTER — Encounter: Payer: Self-pay | Admitting: Family Medicine

## 2016-11-09 VITALS — BP 120/78 | HR 80 | Temp 97.9°F | Resp 14 | Wt 193.1 lb

## 2016-11-09 DIAGNOSIS — Z6835 Body mass index (BMI) 35.0-35.9, adult: Secondary | ICD-10-CM | POA: Diagnosis not present

## 2016-11-09 DIAGNOSIS — M2550 Pain in unspecified joint: Secondary | ICD-10-CM

## 2016-11-09 DIAGNOSIS — R12 Heartburn: Secondary | ICD-10-CM

## 2016-11-09 DIAGNOSIS — E6609 Other obesity due to excess calories: Secondary | ICD-10-CM

## 2016-11-09 DIAGNOSIS — J4520 Mild intermittent asthma, uncomplicated: Secondary | ICD-10-CM | POA: Diagnosis not present

## 2016-11-09 DIAGNOSIS — E041 Nontoxic single thyroid nodule: Secondary | ICD-10-CM

## 2016-11-09 MED ORDER — MONTELUKAST SODIUM 10 MG PO TABS: 10.0000 mg | ORAL_TABLET | Freq: Every day | ORAL | 3 refills | Status: DC

## 2016-11-09 MED ORDER — LEVOCETIRIZINE DIHYDROCHLORIDE 5 MG PO TABS: 5.0000 mg | ORAL_TABLET | Freq: Every evening | ORAL | 3 refills | Status: DC

## 2016-11-09 MED ORDER — OLOPATADINE HCL 0.2 % OP SOLN: 1.0000 [drp] | Freq: Every day | OPHTHALMIC | 3 refills | Status: DC

## 2016-11-09 NOTE — Assessment & Plan Note (Signed)
rescan thyroid US 

## 2016-11-09 NOTE — Progress Notes (Signed)
BP 120/78 (BP Location: Left Arm, Patient Position: Sitting, Cuff Size: Normal)   Pulse 80   Temp 97.9 F (36.6 C) (Oral)   Resp 14   Wt 193 lb 2 oz (87.6 kg)   SpO2 95%   BMI 35.32 kg/m    Subjective:    Patient ID: Yolanda Pace, female    DOB: [birth year], 50 y.o.   MRN: [id number]  HPI: Yolanda Pace is a 50 y.o. female  Chief Complaint  Patient presents with  . Follow-up    6 month   . Medication Refill   She is here for 6 month follow-up  She feels like her allergy medicine is not working as well; her left eye is puffing up sometimes; live Christmas tree right now  She has heartburn and reflux; she is okay  Triggers include spicy foods Taking PPI for years; no osteoporosis to her knowledge in her family; she eats yogurt daily (helps IBS); does eat greens; she eats lots of pinto beans Does feel heart palpitations, may not be absorbing magnesium; feels it at night; little flutter; no chest pain Had heartburn and reflux with her pregnancies, that's when it worsened  Asthma; having to use rescue inhaler more, but not more than 2x a week; cold air is a trigger; laughter   Using ibuprofen for her joints; having to take that for her joints; no dark stools, no blood in stools; she has never tried celebrex; it's in her kneees and wrists; no autoimmune disease in the family; she had an injury and had xrays done back then  Wrist and finger xrays done on the LEFT, August 21, 2012:  Clinical Data: Twisting injury with radial pain and swelling.  Left thumb pain.  LEFT WRIST - COMPLETE 3+ VIEW  Comparison: Left thumb 08/21/2012.  Findings: There are advanced degenerative changes at the first carpometacarpal joint.  No acute fracture.  Minimal degenerative change at the scaphoid trapezium trapezoid joint.  IMPRESSION:  1.  No acute findings. 2.  Advanced osteoarthritis at the first carpometacarpal joint.   Original Report Authenticated By: Luretha Rued,  M.D.  Had cystic nodule on thyroid; 3 mm on left; no trouble swallowing or hoarseness  Depression screen [facility] 2/9 11/09/2016 05/07/2016 10/17/2015  Decreased Interest 0 0 0  Down, Depressed, Hopeless 0 0 0  PHQ - 2 Score 0 0 0   Relevant past medical, surgical, family and social history reviewed Past Medical History:  Diagnosis Date  . Allergic rhinitis   . Anxiety   . Asthma   . Cystic thyroid nodule March 2015   53mm on u/s  . Depression   . Insomnia   . Obesity   . Seizures [facility]) June 2015   provoked after cervical nerve block  . Skin cancer Aug 2015   removed from back  . Vitamin D deficiency disease    Past Surgical History:  Procedure Laterality Date  . ABDOMINAL HYSTERECTOMY  2005ish   fibroids, ovaries remain; along with bladder tack  . bladder tack  2005ish  . CHOLECYSTECTOMY    . SKIN CANCER EXCISION  Aug 2015   removed from back   Family History  Problem Relation Age of Onset  . Hyperlipidemia Mother   . Diabetes Sister   . Hyperlipidemia Sister   . Hypertension Sister   . Hypertension Brother   . Cancer Neg Hx   . COPD Neg Hx   . Heart disease Neg Hx   . Stroke Neg  Hx    Social History  Substance Use Topics  . Smoking status: Never Smoker  . Smokeless tobacco: Never Used  . Alcohol use No   Interim medical history since last visit reviewed. Allergies and medications reviewed  Review of Systems Per HPI unless specifically indicated above     Objective:    BP 120/78 (BP Location: Left Arm, Patient Position: Sitting, Cuff Size: Normal)   Pulse 80   Temp 97.9 F (36.6 C) (Oral)   Resp 14   Wt 193 lb 2 oz (87.6 kg)   SpO2 95%   BMI 35.32 kg/m   Wt Readings from Last 3 Encounters:  11/09/16 193 lb 2 oz (87.6 kg)  05/07/16 191 lb (86.6 kg)  12/30/15 189 lb (85.7 kg)    Physical Exam  Constitutional: She appears well-developed and well-nourished. No distress.  obese  HENT:  Head: Normocephalic and atraumatic.  Eyes: EOM are normal. No  scleral icterus.  Neck: No thyroid mass and no thyromegaly present.  Cardiovascular: Normal rate, regular rhythm and normal heart sounds.   No murmur heard. Pulmonary/Chest: Effort normal and breath sounds normal. No respiratory distress. She has no wheezes.  Abdominal: Soft. Bowel sounds are normal. She exhibits no distension.  Musculoskeletal: Normal range of motion. She exhibits no edema.       Right hand: She exhibits tenderness. She exhibits normal range of motion.       Left hand: She exhibits tenderness. She exhibits normal range of motion.  Tender to palpation over bases of thumbs; no MCP enlargement or ulnar deviation  Neurological: She is alert. She exhibits normal muscle tone.  Skin: Skin is warm and dry. She is not diaphoretic. No pallor.  Psychiatric: She has a normal mood and affect. Her behavior is normal. Judgment and thought content normal. Her mood appears not anxious. She does not exhibit a depressed mood.  Vitals reviewed.  Results for orders placed or performed in visit on 05/07/16  Urinalysis w microscopic + reflex cultur  Result Value Ref Range   Color, Urine YELLOW YELLOW   APPearance CLEAR CLEAR   Specific Gravity, Urine 1.023 1.001 - 1.035   pH 6.5 5.0 - 8.0   Glucose, UA NEGATIVE NEGATIVE   Bilirubin Urine NEGATIVE NEGATIVE   Ketones, ur NEGATIVE NEGATIVE   Hgb urine dipstick NEGATIVE NEGATIVE   Protein, ur NEGATIVE NEGATIVE   Nitrite NEGATIVE NEGATIVE   Leukocytes, UA NEGATIVE NEGATIVE   WBC, UA NONE SEEN <=5 WBC/HPF   RBC / HPF NONE SEEN <=2 RBC/HPF   Squamous Epithelial / LPF NONE SEEN <=5 HPF   Bacteria, UA NONE SEEN NONE SEEN HPF   Crystals NONE SEEN NONE SEEN HPF   Casts NONE SEEN NONE SEEN LPF   Yeast NONE SEEN NONE SEEN HPF  Antinuclear Antib (ANA)  Result Value Ref Range   Anit Nuclear Antibody(ANA) NEG NEGATIVE  Lipid panel  Result Value Ref Range   Cholesterol 206 (H) 125 - 200 mg/dL   Triglycerides 170 (H) <150 mg/dL   HDL 45 (L) >=46  mg/dL   Total CHOL/HDL Ratio 4.6 <=5.0 Ratio   VLDL 34 (H) <30 mg/dL   LDL Cholesterol 127 <130 mg/dL  COMPLETE METABOLIC PANEL WITH GFR  Result Value Ref Range   Sodium 141 135 - 146 mmol/L   Potassium 4.5 3.5 - 5.3 mmol/L   Chloride 104 98 - 110 mmol/L   CO2 26 20 - 31 mmol/L   Glucose, Bld 103 (H) 65 -  99 mg/dL   BUN 15 7 - 25 mg/dL   Creat 0.64 0.50 - 1.05 mg/dL   Total Bilirubin 0.5 0.2 - 1.2 mg/dL   Alkaline Phosphatase 79 33 - 130 U/L   AST 18 10 - 35 U/L   ALT 26 6 - 29 U/L   Total Protein 6.8 6.1 - 8.1 g/dL   Albumin 4.3 3.6 - 5.1 g/dL   Calcium 9.4 8.6 - 10.4 mg/dL   GFR, Est African American >89 >=60 mL/min   GFR, Est Non African American >89 >=60 mL/min  C-reactive protein  Result Value Ref Range   CRP 0.7 (H) <0.60 mg/dL      Assessment & Plan:   Problem List Items Addressed This Visit      Respiratory   Asthma    Controlled, mild intermittent      Relevant Medications   montelukast (SINGULAIR) 10 MG tablet     Endocrine   Cystic thyroid nodule    rescan thyroid US      Relevant Orders   US THYROID     Other   Pain, joint, multiple sites    Bases of both thumbs and knees; labs negative, but will send to rheum to r/o seronegative RA or other inflammatory cause for her pain      Relevant Orders   Ambulatory referral to Rheumatology   Obesity - Primary    Weight loss encouraged; see AVS      Heartburn    Avoid triggers; cautioned about risk of PPI including decreased absorption of magnesium, which could cause palpitations; if persistent even with increased Mg2+ intake, let me know and we'll get additional labs         Follow up plan: Return in about 6 months (around 05/10/2017).  An after-visit summary was printed and given to the patient at [place].  Please see the patient instructions which may contain other information and recommendations beyond what is mentioned above in the assessment and plan.  Meds ordered this encounter   Medications  . ibuprofen (ADVIL,MOTRIN) 600 MG tablet    Sig: Take 600 mg by mouth every 6 (six) hours as needed.  . cholecalciferol (VITAMIN D) 1000 units tablet    Sig: Take 1,000 Units by mouth daily.  Marland Kitchen levocetirizine (XYZAL) 5 MG tablet    Sig: Take 1 tablet (5 mg total) by mouth every evening.    Dispense:  90 tablet    Refill:  3  . montelukast (SINGULAIR) 10 MG tablet    Sig: Take 1 tablet (10 mg total) by mouth at bedtime.    Dispense:  90 tablet    Refill:  3  . Olopatadine HCl 0.2 % SOLN    Sig: Apply 1 drop to eye daily. Each eye daily    Dispense:  7.5 mL    Refill:  3    Orders Placed This Encounter  Procedures  . US THYROID  . Ambulatory referral to Rheumatology

## 2016-11-09 NOTE — Patient Instructions (Addendum)
Take 250 mg of magnesium oxide every day If palpitations don't resolve, let me know and we'll check some labs We'll have you see the rheumatologist We'll get the scan of your thyroid Try the new eye drops Let me know if allergies aren't controlled  Check out the information at familydoctor.org entitled "Nutrition for Weight Loss: What You Need to Know about Fad Diets" Try to lose between 1-2 pounds per week by taking in fewer calories and burning off more calories You can succeed by limiting portions, limiting foods dense in calories and fat, becoming more active, and drinking 8 glasses of water a day (64 ounces) Don't skip meals, especially breakfast, as skipping meals may alter your metabolism Do not use over-the-counter weight loss pills or gimmicks that claim rapid weight loss A healthy BMI (or body mass index) is between 18.5 and 24.9 You can calculate your ideal BMI at the [place] website ClubMonetize.fr

## 2016-11-09 NOTE — Assessment & Plan Note (Signed)
Bases of both thumbs and knees; labs negative, but will send to rheum to r/o seronegative RA or other inflammatory cause for her pain

## 2016-11-16 ENCOUNTER — Ambulatory Visit: Payer: BC Managed Care – PPO

## 2016-11-16 ENCOUNTER — Telehealth: Payer: Self-pay | Admitting: Family Medicine

## 2016-11-16 DIAGNOSIS — R12 Heartburn: Secondary | ICD-10-CM

## 2016-11-16 NOTE — Assessment & Plan Note (Signed)
Weight loss encouraged; see AVS

## 2016-11-16 NOTE — Assessment & Plan Note (Signed)
Controlled, mild intermittent

## 2016-11-16 NOTE — Telephone Encounter (Signed)
Pt was advised to get MRI. Pt compared prices and found a place less expensive. She is asking that you please send a order to [facility].

## 2016-11-16 NOTE — Assessment & Plan Note (Signed)
Avoid triggers; cautioned about risk of PPI including decreased absorption of magnesium, which could cause palpitations; if persistent even with increased Mg2+ intake, let me know and we'll get additional labs

## 2016-11-21 ENCOUNTER — Other Ambulatory Visit: Payer: Self-pay | Admitting: Family Medicine

## 2016-11-21 NOTE — Telephone Encounter (Signed)
rx approved

## 2016-11-23 NOTE — Telephone Encounter (Signed)
Pt was advised to get MRI. Pt compared prices and found a place less expensive. She is asking that you please send a order to [facility]. Patient stated that she would like an appointment on the 15th of January since she is off that day

## 2016-11-23 NOTE — Telephone Encounter (Signed)
I'm so sorry, but I'm confused I do not see that I've ordered an MRI and I don't know what the MRI is for Perhaps I didn't write it in my note, but I need more information If another doctor recommended the MRI, that other doctor should be the one to order it

## 2016-11-24 ENCOUNTER — Telehealth: Payer: Self-pay | Admitting: Family Medicine

## 2016-11-24 NOTE — Telephone Encounter (Signed)
Pt returned your call.  

## 2016-11-25 NOTE — Telephone Encounter (Signed)
Last note from 1/10/208:  Pt was advised to get MRI. Pt compared prices and found a place less expensive. She is asking that you please send a order to [facility]. Patient stated that she would like an appointment on the 15th of January since she is off that day   Note from today: 11/25/2016  Dr lada stated she don't see that she wanted a MRI. I looked in her chart and saw that she wanted an Ultrasound imaging done for her thyroid. Patient thinks she has to get an MRI; Tried calling patient to mention to her its Ultasound and not MRI. I don't know if she was told an MRI but I left a message asking her to give me or Roselyn Reef a call back so we can clarify what she suppose to have.

## 2016-11-25 NOTE — Telephone Encounter (Signed)
Patient called stating that she was returning Amber's call regarding an MRI that she did not know anything about. She said that she was only suppose to be getting a ultrasound and she wanted to get it for Monday, January 15, since she was off.   I informed her that I did not see an order for a MRI nor did I know anything about the conversation regarding that but that I did see an order for a thyroid ultrasound. I then asked her where she wanted to go and she told me [facility] since she was told by her insurance that it will be cheaper there. I then asked if I could put her on hold so that I could call them to find out their protocol for getting this scheduled. I was informed that they do not do outside orders just for their patients. I then informed this patient and she said that's not what my insurance said. I gave her their number [phone], and told her to call them and then call us back to let us know how she wants to proceed with this issue.   She said ok and thanks.

## 2016-11-29 ENCOUNTER — Ambulatory Visit: Payer: BC Managed Care – PPO

## 2016-11-29 DIAGNOSIS — M7542 Impingement syndrome of left shoulder: Secondary | ICD-10-CM | POA: Insufficient documentation

## 2016-12-13 DIAGNOSIS — M159 Polyosteoarthritis, unspecified: Secondary | ICD-10-CM | POA: Insufficient documentation

## 2016-12-18 ENCOUNTER — Telehealth: Payer: Self-pay | Admitting: Family Medicine

## 2016-12-18 NOTE — Telephone Encounter (Signed)
Please follow-up on the thyroid US that was ordered in late December; thank you

## 2016-12-20 NOTE — Telephone Encounter (Signed)
Name: Yolanda Pace, Yolanda Pace MRN: [id number]  Date: 11/29/2016 Status: Can  Time: 4:30 PM Length: 30  Visit Type: US THYROID [id number] Copay: $0.00  Provider: [facility]-US 3 Department: [facility]-ULTRASOUND  Referring Provider: Arnetha Courser CSN: [id number]  Notes: mar/patient/arrive@415pm @[facility] Entr[facility].  Made On: Canceled: 11/16/2016 9:57 AM 11/25/2016 4:17 PM By: By: Herma Carson, CARRIELELIA E  Cancel Rsn: Patient (scheduling conflict will call back at a later time)  Appointment Orders

## 2016-12-20 NOTE — Telephone Encounter (Addendum)
Per the request of Dr. Sanda Klein, I contacted this patient to see if we could get her thyroid ultrasound rescheduled, but there was no answer. A message was left stating that I was calling to see if we could reschedule her ultrasound for her or that she could give them a call herself at [phone].   I encouraged her to give them or [place] a call at her earliest conveniece, but to feel free to give [place] a call if she had questions or concerns.

## 2016-12-20 NOTE — Telephone Encounter (Signed)
Thank you; can you contact her and ask her to reschedule? Thanks

## 2017-01-04 ENCOUNTER — Other Ambulatory Visit: Payer: Self-pay | Admitting: Family Medicine

## 2017-01-04 DIAGNOSIS — Z1231 Encounter for screening mammogram for malignant neoplasm of breast: Secondary | ICD-10-CM

## 2017-01-11 ENCOUNTER — Other Ambulatory Visit: Payer: Self-pay

## 2017-01-11 MED ORDER — CLONAZEPAM 0.5 MG PO TABS: ORAL_TABLET | ORAL | 0 refills | Status: DC

## 2017-01-11 NOTE — Telephone Encounter (Signed)
Last visit Dec 2017; reviewed [place] web site over last year No red flags Rx approved for very limited amount

## 2017-01-11 NOTE — Telephone Encounter (Signed)
Last (acute) OV: 12/30/15 Last routine OV: 10/17/15 Next OV: None on file.   Appears to have been a [facility] pt

## 2017-01-28 ENCOUNTER — Ambulatory Visit
Admission: RE | Admit: 2017-01-28 | Discharge: 2017-01-28 | Disposition: A | Discharge status: Home / Self Care | Payer: BC Managed Care – PPO | Source: Ambulatory Visit | Attending: Family Medicine | Admitting: Family Medicine

## 2017-01-28 DIAGNOSIS — Z1231 Encounter for screening mammogram for malignant neoplasm of breast: Secondary | ICD-10-CM | POA: Diagnosis not present

## 2017-02-25 ENCOUNTER — Encounter: Payer: Self-pay | Admitting: Family Medicine

## 2017-02-25 ENCOUNTER — Ambulatory Visit (INDEPENDENT_AMBULATORY_CARE_PROVIDER_SITE_OTHER): Payer: BC Managed Care – PPO | Admitting: Family Medicine

## 2017-02-25 VITALS — BP 122/76 | HR 94 | Temp 98.3°F | Resp 16 | Wt 187.4 lb

## 2017-02-25 DIAGNOSIS — E041 Nontoxic single thyroid nodule: Secondary | ICD-10-CM | POA: Diagnosis not present

## 2017-02-25 DIAGNOSIS — R05 Cough: Secondary | ICD-10-CM

## 2017-02-25 DIAGNOSIS — J0141 Acute recurrent pansinusitis: Secondary | ICD-10-CM | POA: Diagnosis not present

## 2017-02-25 DIAGNOSIS — R059 Cough, unspecified: Secondary | ICD-10-CM

## 2017-02-25 MED ORDER — BENZONATATE 100 MG PO CAPS: 100.0000 mg | ORAL_CAPSULE | Freq: Three times a day (TID) | ORAL | 0 refills | Status: AC | PRN

## 2017-02-25 MED ORDER — AMOXICILLIN-POT CLAVULANATE 875-125 MG PO TABS: 1.0000 | ORAL_TABLET | Freq: Two times a day (BID) | ORAL | 0 refills | Status: AC

## 2017-02-25 MED ORDER — FLUCONAZOLE 150 MG PO TABS: 150.0000 mg | ORAL_TABLET | Freq: Once | ORAL | 0 refills | Status: AC

## 2017-02-25 NOTE — Assessment & Plan Note (Signed)
Will order [place] at facility where patient can save significant $

## 2017-02-25 NOTE — Patient Instructions (Signed)
Please do eat yogurt daily or take a probiotic daily for the next month or two We want to replace the healthy germs in the gut If you notice foul, watery diarrhea in the next two months, schedule an appointment RIGHT AWAY Use the tessalon perles if needed for cough Okay to use plain mucinex to help loosen those chunks Try vitamin C (orange juice if not diabetic or vitamin C tablets) and drink green tea to help your immune system during your illness Get plenty of rest and hydration Call if getting worse

## 2017-02-25 NOTE — Progress Notes (Signed)
BP 122/76   Pulse 94   Temp 98.3 F (36.8 C) (Oral)   Resp 16   Wt 187 lb 6.4 oz (85 kg)   SpO2 95%   BMI 34.28 kg/m    Subjective:    Patient ID: Yolanda Pace, female    DOB: [birth year], 51 y.o.   MRN: [id number]  HPI: Yolanda Pace is a 51 y.o. female  Chief Complaint  Patient presents with  . URI    cough, congestion, runny nose, head ache and sore throat.  Onset 3 weeks.  Went to [facility] clinic was dx with uri given zpack and went again and was given levoflaxin but did not take due to reading side effects.    HPI She went to the walk-in clinic at [facility] They gave her azithromycin Never took the levaquin Both sides of sinuses are affected Throat was hurting so bad; feels like tongue is sore; never tested for strep Did test for flu then and it was negative She does feel better than she did two weeks ago, but now settled Coughing, bringing up little chunks every time  She had had a nodule on the thyroid; she has found a place to do the [place] for much cheaper  Depression screen [facility] 2/9 02/25/2017 11/09/2016 05/07/2016 10/17/2015  Decreased Interest 0 0 0 0  Down, Depressed, Hopeless 0 0 0 0  PHQ - 2 Score 0 0 0 0    Relevant past medical, surgical, family and social history reviewed Past Medical History:  Diagnosis Date  . Allergic rhinitis   . Anxiety   . Asthma   . 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 Depression   . Insomnia   . Obesity   . Seizures [facility]) June 2015   provoked after cervical nerve block  . Skin cancer Aug 2015   removed from back  . Vitamin D deficiency disease    Past Surgical History:  Procedure Laterality Date  . ABDOMINAL HYSTERECTOMY  2005ish   fibroids, ovaries remain; along with bladder tack  . bladder tack  2005ish  . CHOLECYSTECTOMY    . SKIN CANCER EXCISION  Aug 2015   removed from back   Family History  Problem Relation Age of Onset  . Hyperlipidemia Mother   . Diabetes Sister   . Hyperlipidemia Sister   .  Hypertension Sister   . Hypertension Brother   . Cancer Neg Hx   . COPD Neg Hx   . Heart disease Neg Hx   . Stroke Neg Hx    Social History  Substance Use Topics  . Smoking status: Never Smoker  . Smokeless tobacco: Never Used  . Alcohol use No    Interim medical history since last visit reviewed. Allergies and medications reviewed  Review of Systems Per HPI unless specifically indicated above     Objective:    BP 122/76   Pulse 94   Temp 98.3 F (36.8 C) (Oral)   Resp 16   Wt 187 lb 6.4 oz (85 kg)   SpO2 95%   BMI 34.28 kg/m   Wt Readings from Last 3 Encounters:  02/25/17 187 lb 6.4 oz (85 kg)  11/09/16 193 lb 2 oz (87.6 kg)  05/07/16 191 lb (86.6 kg)    Physical Exam  Constitutional: She appears well-developed and well-nourished.  Non-toxic appearance. No distress (but coughing occasionally; appears to not feel well).  HENT:  Right Ear: External ear normal.  Left  Ear: External ear normal.  Nose: Mucosal edema and rhinorrhea present. Right sinus exhibits maxillary sinus tenderness and frontal sinus tenderness. Left sinus exhibits maxillary sinus tenderness and frontal sinus tenderness.  Mouth/Throat: Oropharynx is clear and moist and mucous membranes are normal. No posterior oropharyngeal edema.  Left canal occluded by cerumen; right canal normal; no erythema of right TM; nasal mucosa very erythematous; tonsillolith right side  Eyes: EOM are normal. No scleral icterus.  Cardiovascular: Normal rate and regular rhythm.   Pulmonary/Chest: Effort normal and breath sounds normal. No accessory muscle usage. No respiratory distress. She has no decreased breath sounds. She has no wheezes. She has no rhonchi.  Lymphadenopathy:    She has no cervical adenopathy.  Skin: She is not diaphoretic. No pallor.  Psychiatric: She has a normal mood and affect. Her behavior is normal.    Results for orders placed or performed in visit on 05/07/16  Urinalysis w microscopic + reflex  cultur  Result Value Ref Range   Color, Urine YELLOW YELLOW   APPearance CLEAR CLEAR   Specific Gravity, Urine 1.023 1.001 - 1.035   pH 6.5 5.0 - 8.0   Glucose, UA NEGATIVE NEGATIVE   Bilirubin Urine NEGATIVE NEGATIVE   Ketones, ur NEGATIVE NEGATIVE   Hgb urine dipstick NEGATIVE NEGATIVE   Protein, ur NEGATIVE NEGATIVE   Nitrite NEGATIVE NEGATIVE   Leukocytes, UA NEGATIVE NEGATIVE   WBC, UA NONE SEEN <=5 WBC/HPF   RBC / HPF NONE SEEN <=2 RBC/HPF   Squamous Epithelial / LPF NONE SEEN <=5 HPF   Bacteria, UA NONE SEEN NONE SEEN HPF   Crystals NONE SEEN NONE SEEN HPF   Casts NONE SEEN NONE SEEN LPF   Yeast NONE SEEN NONE SEEN HPF  Antinuclear Antib (ANA)  Result Value Ref Range   Anit Nuclear Antibody(ANA) NEG NEGATIVE  Lipid panel  Result Value Ref Range   Cholesterol 206 (H) 125 - 200 mg/dL   Triglycerides 170 (H) <150 mg/dL   HDL 45 (L) >=46 mg/dL   Total CHOL/HDL Ratio 4.6 <=5.0 Ratio   VLDL 34 (H) <30 mg/dL   LDL Cholesterol 127 <130 mg/dL  COMPLETE METABOLIC PANEL WITH GFR  Result Value Ref Range   Sodium 141 135 - 146 mmol/L   Potassium 4.5 3.5 - 5.3 mmol/L   Chloride 104 98 - 110 mmol/L   CO2 26 20 - 31 mmol/L   Glucose, Bld 103 (H) 65 - 99 mg/dL   BUN 15 7 - 25 mg/dL   Creat 0.64 0.50 - 1.05 mg/dL   Total Bilirubin 0.5 0.2 - 1.2 mg/dL   Alkaline Phosphatase 79 33 - 130 U/L   AST 18 10 - 35 U/L   ALT 26 6 - 29 U/L   Total Protein 6.8 6.1 - 8.1 g/dL   Albumin 4.3 3.6 - 5.1 g/dL   Calcium 9.4 8.6 - 10.4 mg/dL   GFR, Est African American >89 >=60 mL/min   GFR, Est Non African American >89 >=60 mL/min  C-reactive protein  Result Value Ref Range   CRP 0.7 (H) <0.60 mg/dL      Assessment & Plan:   Problem List Items Addressed This Visit      Endocrine   Cystic thyroid nodule    Will order [place] at facility where patient can save significant $      Relevant Orders   US THYROID    Other Visit Diagnoses    Acute recurrent pansinusitis    -  Primary  following what was likely a viral URI; start augmentin; rest, hdyration, C diff precautions; reasons to call reviewed   Relevant Medications   fluticasone (FLONASE) 50 MCG/ACT nasal spray   amoxicillin-clavulanate (AUGMENTIN) 875-125 MG tablet   fluconazole (DIFLUCAN) 150 MG tablet   benzonatate (TESSALON PERLES) 100 MG capsule   Cough       tessalon perles prescribed; she has inhaler if needed       Follow up plan: No Follow-up on file.  An after-visit summary was printed and given to the patient at [place].  Please see the patient instructions which may contain other information and recommendations beyond what is mentioned above in the assessment and plan.  Meds ordered this encounter  Medications  . fluticasone (FLONASE) 50 MCG/ACT nasal spray    Sig: Place 2 sprays into the nose daily.  Marland Kitchen amoxicillin-clavulanate (AUGMENTIN) 875-125 MG tablet    Sig: Take 1 tablet by mouth 2 (two) times daily.    Dispense:  20 tablet    Refill:  0  . fluconazole (DIFLUCAN) 150 MG tablet    Sig: Take 1 tablet (150 mg total) by mouth once.    Dispense:  1 tablet    Refill:  0  . benzonatate (TESSALON PERLES) 100 MG capsule    Sig: Take 1 capsule (100 mg total) by mouth every 8 (eight) hours as needed for cough.    Dispense:  30 capsule    Refill:  0    Orders Placed This Encounter  Procedures  . US THYROID

## 2017-03-09 ENCOUNTER — Ambulatory Visit: Payer: BC Managed Care – PPO

## 2017-05-06 ENCOUNTER — Encounter: Payer: Self-pay | Admitting: Family Medicine

## 2017-05-06 ENCOUNTER — Ambulatory Visit (INDEPENDENT_AMBULATORY_CARE_PROVIDER_SITE_OTHER): Payer: BC Managed Care – PPO | Admitting: Family Medicine

## 2017-05-06 VITALS — BP 122/70 | HR 82 | Temp 98.1°F | Resp 16 | Ht 63.0 in | Wt 188.2 lb

## 2017-05-06 DIAGNOSIS — E6609 Other obesity due to excess calories: Secondary | ICD-10-CM | POA: Diagnosis not present

## 2017-05-06 DIAGNOSIS — J3089 Other allergic rhinitis: Secondary | ICD-10-CM | POA: Diagnosis not present

## 2017-05-06 DIAGNOSIS — E041 Nontoxic single thyroid nodule: Secondary | ICD-10-CM

## 2017-05-06 DIAGNOSIS — R030 Elevated blood-pressure reading, without diagnosis of hypertension: Secondary | ICD-10-CM | POA: Diagnosis not present

## 2017-05-06 DIAGNOSIS — R739 Hyperglycemia, unspecified: Secondary | ICD-10-CM

## 2017-05-06 DIAGNOSIS — J4520 Mild intermittent asthma, uncomplicated: Secondary | ICD-10-CM | POA: Diagnosis not present

## 2017-05-06 DIAGNOSIS — E559 Vitamin D deficiency, unspecified: Secondary | ICD-10-CM | POA: Diagnosis not present

## 2017-05-06 DIAGNOSIS — Z5181 Encounter for therapeutic drug level monitoring: Secondary | ICD-10-CM | POA: Diagnosis not present

## 2017-05-06 DIAGNOSIS — Z6833 Body mass index (BMI) 33.0-33.9, adult: Secondary | ICD-10-CM

## 2017-05-06 DIAGNOSIS — E7800 Pure hypercholesterolemia, unspecified: Secondary | ICD-10-CM

## 2017-05-06 DIAGNOSIS — M2550 Pain in unspecified joint: Secondary | ICD-10-CM

## 2017-05-06 MED ORDER — SERTRALINE HCL 50 MG PO TABS: 25.0000 mg | ORAL_TABLET | Freq: Every day | ORAL | 2 refills | Status: DC

## 2017-05-06 NOTE — Assessment & Plan Note (Signed)
Check level and supplement as indicated 

## 2017-05-06 NOTE — Assessment & Plan Note (Signed)
Glad to hear moldy curtains are gone; continue meds

## 2017-05-06 NOTE — Assessment & Plan Note (Signed)
Asking staff to check on previous order; recheck TSH

## 2017-05-06 NOTE — Patient Instructions (Addendum)
Okay to continue to taper down on the sertraline If any issues, just call me If you have not heard about the date/time of your thyroid US in one week, please call Kristeen Miss here at the office Try turmeric as a natural anti-inflammatory (for pain and arthritis). It comes in capsules where you buy aspirin and fish oil, but also as a spice where you buy pepper and garlic powder. Check out the information at familydoctor.org entitled "Nutrition for Weight Loss: What You Need to Know about Fad Diets" Try to lose between 1-2 pounds per week by taking in fewer calories and burning off more calories You can succeed by limiting portions, limiting foods dense in calories and fat, becoming more active, and drinking 8 glasses of water a day (64 ounces) Don't skip meals, especially breakfast, as skipping meals may alter your metabolism Do not use over-the-counter weight loss pills or gimmicks that claim rapid weight loss A healthy BMI (or body mass index) is between 18.5 and 24.9 You can calculate your ideal BMI at the [place] website ClubMonetize.fr

## 2017-05-06 NOTE — Assessment & Plan Note (Signed)
Check labs 

## 2017-05-06 NOTE — Assessment & Plan Note (Signed)
In the past, today's reading is normal

## 2017-05-06 NOTE — Assessment & Plan Note (Signed)
Check fasting labs in the next few weeks; limit fatty meats

## 2017-05-06 NOTE — Assessment & Plan Note (Signed)
Patient is working on it

## 2017-05-06 NOTE — Assessment & Plan Note (Signed)
Saw rheumatologist; suggested turmeric

## 2017-05-06 NOTE — Assessment & Plan Note (Signed)
Avoid allergens, triggers; continue medicines

## 2017-05-06 NOTE — Progress Notes (Signed)
BP 122/70   Pulse 82   Temp 98.1 F (36.7 C) (Oral)   Resp 16   Ht 5\' 3"  (1.6 m)   Wt 188 lb 3.2 oz (85.4 kg)   SpO2 95%   BMI 33.34 kg/m    Subjective:    Patient ID: Yolanda Pace, female    DOB: May 09, 1966, 51 y.o.   MRN: [id number]  HPI: Yolanda Pace is a 51 y.o. female  Chief Complaint  Patient presents with  . 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 and they were covered with mold; back of the curtain was covered with mold; those are gone; allergies improved too after they were taken down  Thyroid nodule; never had US done  Hx of vit D deficiency; taking supplement every day; energy level is okay  High cholesterol total was only 206; tries to limit fatty meats but husband cooks breakfast every day; cutting back  Glucose just a little elevated; needs new contact exam, that's the vision issue; dry mouth; MGM had diabetes, M aunt and half-sister all have it; father's side unknown  Taking clonazepam rarely; last Rx lasted a year; only takes a half at a time;   Has arthritis and uses the half to help her sleep  Depression screen [facility] 2/9 05/06/2017 02/25/2017 11/09/2016 05/07/2016 10/17/2015  Decreased Interest 0 0 0 0 0  Down, Depressed, Hopeless 0 0 0 0 0  PHQ - 2 Score 0 0 0 0 0    Relevant past medical, surgical, family and social history reviewed Past Medical History:  Diagnosis Date  . Allergic rhinitis   . Anxiety   . Asthma   . Cystic thyroid nodule March 2015   33mm on u/s  . Depression   . Insomnia   . Obesity   . Seizures [facility]) June 2015   provoked  after cervical nerve block  . Skin cancer Aug 2015   removed from back  . Vitamin D deficiency disease    Past Surgical History:  Procedure Laterality Date  . ABDOMINAL HYSTERECTOMY  2005ish   fibroids, ovaries remain; along with bladder tack  . bladder tack  2005ish  . CHOLECYSTECTOMY    . SKIN CANCER EXCISION  Aug 2015   removed from back   Family History  Problem Relation Age of Onset  . Hyperlipidemia Mother   . Diabetes Sister   . Hyperlipidemia Sister   . Hypertension Sister   . Hypertension Brother   . Cancer Neg Hx   . COPD Neg Hx   . Heart disease Neg Hx   . Stroke Neg Hx    Social History   Social History  . Marital status: Married    Spouse name: N/A  . Number of children: N/A  . Years of education: N/A   Occupational History  . Not on file.   Social History Main Topics  . Smoking status: Never Smoker  . Smokeless tobacco: Never Used  . Alcohol use No  . Drug use: No  .  Sexual activity: Yes   Other Topics Concern  . Not on file   Social History Narrative  . No narrative on file    Interim medical history since last visit reviewed. Allergies and medications reviewed  Review of Systems Per HPI unless specifically indicated above     Objective:    BP 122/70   Pulse 82   Temp 98.1 F (36.7 C) (Oral)   Resp 16   Ht 5\' 3"  (1.6 m)   Wt 188 lb 3.2 oz (85.4 kg)   SpO2 95%   BMI 33.34 kg/m   Wt Readings from Last 3 Encounters:  05/06/17 188 lb 3.2 oz (85.4 kg)  02/25/17 187 lb 6.4 oz (85 kg)  11/09/16 193 lb 2 oz (87.6 kg)    Physical Exam  Constitutional: She appears well-developed and well-nourished. No distress.  obese  HENT:  Head: Normocephalic and atraumatic.  Eyes: EOM are normal. No scleral icterus.  Neck: No thyroid mass and no thyromegaly present.  Cardiovascular: Normal rate, regular rhythm and normal heart sounds.   No murmur heard. Pulmonary/Chest: Effort normal and breath sounds normal. No respiratory distress. She has  no wheezes.  Abdominal: Soft. Bowel sounds are normal. She exhibits no distension.  Musculoskeletal: Normal range of motion. She exhibits no edema.  Neurological: She is alert. She exhibits normal muscle tone.  Skin: Skin is warm and dry. She is not diaphoretic. No pallor.  Psychiatric: She has a normal mood and affect. Her behavior is normal. Judgment and thought content normal. Her mood appears not anxious. She does not exhibit a depressed mood.  Vitals reviewed.  Results for orders placed or performed in visit on 05/07/16  Urinalysis w microscopic + reflex cultur  Result Value Ref Range   Color, Urine YELLOW YELLOW   APPearance CLEAR CLEAR   Specific Gravity, Urine 1.023 1.001 - 1.035   pH 6.5 5.0 - 8.0   Glucose, UA NEGATIVE NEGATIVE   Bilirubin Urine NEGATIVE NEGATIVE   Ketones, ur NEGATIVE NEGATIVE   Hgb urine dipstick NEGATIVE NEGATIVE   Protein, ur NEGATIVE NEGATIVE   Nitrite NEGATIVE NEGATIVE   Leukocytes, UA NEGATIVE NEGATIVE   WBC, UA NONE SEEN <=5 WBC/HPF   RBC / HPF NONE SEEN <=2 RBC/HPF   Squamous Epithelial / LPF NONE SEEN <=5 HPF   Bacteria, UA NONE SEEN NONE SEEN HPF   Crystals NONE SEEN NONE SEEN HPF   Casts NONE SEEN NONE SEEN LPF   Yeast NONE SEEN NONE SEEN HPF  Antinuclear Antib (ANA)  Result Value Ref Range   Anit Nuclear Antibody(ANA) NEG NEGATIVE  Lipid panel  Result Value Ref Range   Cholesterol 206 (H) 125 - 200 mg/dL   Triglycerides 170 (H) <150 mg/dL   HDL 45 (L) >=46 mg/dL   Total CHOL/HDL Ratio 4.6 <=5.0 Ratio   VLDL 34 (H) <30 mg/dL   LDL Cholesterol 127 <130 mg/dL  COMPLETE METABOLIC PANEL WITH GFR  Result Value Ref Range   Sodium 141 135 - 146 mmol/L   Potassium 4.5 3.5 - 5.3 mmol/L   Chloride 104 98 - 110 mmol/L   CO2 26 20 - 31 mmol/L   Glucose, Bld 103 (H) 65 - 99 mg/dL   BUN 15 7 - 25 mg/dL   Creat 0.64 0.50 - 1.05 mg/dL   Total Bilirubin 0.5 0.2 - 1.2 mg/dL   Alkaline Phosphatase 79 33 - 130 U/L   AST 18 10 - 35 U/L   ALT 26 6  -  29 U/L   Total Protein 6.8 6.1 - 8.1 g/dL   Albumin 4.3 3.6 - 5.1 g/dL   Calcium 9.4 8.6 - 10.4 mg/dL   GFR, Est African American >89 >=60 mL/min   GFR, Est Non African American >89 >=60 mL/min  C-reactive protein  Result Value Ref Range   CRP 0.7 (H) <0.60 mg/dL      Assessment & Plan:   Problem List Items Addressed This Visit      Respiratory   Asthma    Avoid allergens, triggers; continue medicines      Allergic rhinitis - Primary    Glad to hear moldy curtains are gone; continue meds        Endocrine   Cystic thyroid nodule    Asking staff to check on previous order; recheck TSH      Relevant Orders   TSH   US THYROID     Other   Vitamin D deficiency disease    Check level and supplement as indicated      Relevant Orders   VITAMIN D 25 Hydroxy (Vit-D Deficiency, Fractures)   Pain, joint, multiple sites    Saw rheumatologist; suggested turmeric      Obesity    Patient is working on it      Mild hypercholesterolemia    Check fasting labs in the next few weeks; limit fatty meats      Relevant Orders   Lipid panel   Medication monitoring encounter    Check labs      Relevant Orders   COMPLETE METABOLIC PANEL WITH GFR   Elevated blood pressure reading    In the past, today's reading is normal       Other Visit Diagnoses    Hyperglycemia       check A1c and glucose   Relevant Orders   Lipid panel   Hemoglobin A1c       Follow up plan: Return in about 6 months (around 11/05/2017) for follow-up visit with Dr. Sanda Klein; 1-2 weeks for fasting labs only.  An after-visit summary was printed and given to the patient at [place].  Please see the patient instructions which may contain other information and recommendations beyond what is mentioned above in the assessment and plan.  Meds ordered this encounter  Medications  . sertraline (ZOLOFT) 50 MG tablet    Sig: Take 0.5-1 tablets (25-50 mg total) by mouth daily.    Dispense:  30 tablet     Refill:  2    Tapering down    Orders Placed This Encounter  Procedures  . US THYROID  . COMPLETE METABOLIC PANEL WITH GFR  . Lipid panel  . Hemoglobin A1c  . VITAMIN D 25 Hydroxy (Vit-D Deficiency, Fractures)  . TSH

## 2017-05-10 ENCOUNTER — Ambulatory Visit: Payer: BC Managed Care – PPO | Admitting: Family Medicine

## 2017-05-25 ENCOUNTER — Other Ambulatory Visit: Payer: Self-pay | Admitting: Family Medicine

## 2017-05-25 ENCOUNTER — Telehealth: Payer: Self-pay | Admitting: Family Medicine

## 2017-05-25 DIAGNOSIS — E041 Nontoxic single thyroid nodule: Secondary | ICD-10-CM

## 2017-05-25 LAB — COMPLETE METABOLIC PANEL WITH GFR
ALK PHOS: 80 U/L (ref 33–130)
ALT: 18 U/L (ref 6–29)
AST: 15 U/L (ref 10–35)
Albumin: 4.2 g/dL (ref 3.6–5.1)
BILIRUBIN TOTAL: 0.6 mg/dL (ref 0.2–1.2)
BUN: 13 mg/dL (ref 7–25)
CALCIUM: 9.4 mg/dL (ref 8.6–10.4)
CHLORIDE: 106 mmol/L (ref 98–110)
CO2: 23 mmol/L (ref 20–31)
CREATININE: 0.6 mg/dL (ref 0.50–1.05)
GFR, Est Non African American: >89 mL/min (ref >=60)
Glucose, Bld: 85 mg/dL (ref 65–99)
Potassium: 4.3 mmol/L (ref 3.5–5.3)
Sodium: 141 mmol/L (ref 135–146)
TOTAL PROTEIN: 6.8 g/dL (ref 6.1–8.1)

## 2017-05-25 LAB — LIPID PANEL
CHOLESTEROL: 231 mg/dL — AB (ref <200)
HDL: 49 mg/dL — ABNORMAL LOW (ref >50)
LDL Cholesterol: 153 mg/dL — ABNORMAL HIGH (ref <100)
TRIGLYCERIDES: 145 mg/dL (ref <150)
Total CHOL/HDL Ratio: 4.7 Ratio (ref <5.0)
VLDL: 29 mg/dL (ref <30)

## 2017-05-25 LAB — TSH: TSH: 1.52 mIU/L

## 2017-05-25 NOTE — Telephone Encounter (Signed)
Patient says she had an US done and wanted the results I have looked and do not see anything in MyChart [facility]) or Care Everywhere, and nothing faxed in my box on paper Please track down report and get to me for review; thank you

## 2017-05-25 NOTE — Telephone Encounter (Signed)
I called patient It was on her thyroid, she wanted to go to [facility].  I contacted [facility] and they will send over report.

## 2017-05-26 ENCOUNTER — Encounter: Payer: Self-pay | Admitting: Family Medicine

## 2017-05-26 ENCOUNTER — Other Ambulatory Visit: Payer: Self-pay

## 2017-05-26 DIAGNOSIS — E785 Hyperlipidemia, unspecified: Secondary | ICD-10-CM

## 2017-05-26 DIAGNOSIS — E041 Nontoxic single thyroid nodule: Secondary | ICD-10-CM

## 2017-05-26 HISTORY — DX: Nontoxic single thyroid nodule: E04.1

## 2017-05-26 LAB — HEMOGLOBIN A1C
Hgb A1c MFr Bld: 5.3 % (ref <5.7)
Mean Plasma Glucose: 105 mg/dL

## 2017-05-26 LAB — VITAMIN D 25 HYDROXY (VIT D DEFICIENCY, FRACTURES): VIT D 25 HYDROXY: 28 ng/mL — AB (ref 30–100)

## 2017-05-26 NOTE — Assessment & Plan Note (Signed)
Refer to ENT for management, monitoring

## 2017-05-26 NOTE — Telephone Encounter (Signed)
Thank you for getting the report Brand new 10 mm solid thyroid nodule; will get her in to see ENT for monitoring, management Previous cystic nodule on left

## 2017-05-30 ENCOUNTER — Encounter: Payer: Self-pay | Admitting: Family Medicine

## 2017-06-14 ENCOUNTER — Telehealth: Payer: Self-pay

## 2017-06-14 NOTE — Telephone Encounter (Signed)
Pt haven't herd from from the referral that was put in on 05/26/2017. Looked at the referrals and saw that it was faxed the same day with a release number. [facility] and spoke with Patient coordinator and she states she did not receive anything. Spoke to [facility] and she re faxed everything over again. Informed pt to give [place] a call back if she do not hear anything form them.

## 2017-06-15 ENCOUNTER — Telehealth: Payer: Self-pay

## 2017-06-15 ENCOUNTER — Other Ambulatory Visit: Payer: Self-pay | Admitting: Otolaryngology

## 2017-06-15 DIAGNOSIS — E041 Nontoxic single thyroid nodule: Secondary | ICD-10-CM

## 2017-06-15 NOTE — Telephone Encounter (Signed)
Beth called from [facility] to get pt [place] results of her thyroid. Faxed over a copy to her with the fax number provided; [phone].

## 2017-06-17 ENCOUNTER — Other Ambulatory Visit: Payer: Self-pay | Admitting: Otolaryngology

## 2017-06-17 ENCOUNTER — Other Ambulatory Visit: Payer: Self-pay | Admitting: *Deleted

## 2017-06-17 DIAGNOSIS — E041 Nontoxic single thyroid nodule: Secondary | ICD-10-CM

## 2017-06-20 ENCOUNTER — Ambulatory Visit
Admission: RE | Admit: 2017-06-20 | Discharge: 2017-06-20 | Disposition: A | Discharge status: Home / Self Care | Payer: BC Managed Care – PPO | Source: Ambulatory Visit | Attending: Otolaryngology | Admitting: Otolaryngology

## 2017-06-20 DIAGNOSIS — E041 Nontoxic single thyroid nodule: Secondary | ICD-10-CM | POA: Diagnosis not present

## 2017-07-07 ENCOUNTER — Ambulatory Visit: Payer: BC Managed Care – PPO | Admitting: Family Medicine

## 2017-07-11 ENCOUNTER — Ambulatory Visit (INDEPENDENT_AMBULATORY_CARE_PROVIDER_SITE_OTHER): Payer: BC Managed Care – PPO | Admitting: Family Medicine

## 2017-07-11 ENCOUNTER — Encounter: Payer: Self-pay | Admitting: Family Medicine

## 2017-07-11 DIAGNOSIS — R7982 Elevated C-reactive protein (CRP): Secondary | ICD-10-CM | POA: Insufficient documentation

## 2017-07-11 DIAGNOSIS — R7989 Other specified abnormal findings of blood chemistry: Secondary | ICD-10-CM

## 2017-07-11 DIAGNOSIS — M2550 Pain in unspecified joint: Secondary | ICD-10-CM | POA: Diagnosis not present

## 2017-07-11 MED ORDER — MELOXICAM 15 MG PO TABS: 15.0000 mg | ORAL_TABLET | Freq: Every day | ORAL | 1 refills | Status: DC | PRN

## 2017-07-11 NOTE — Assessment & Plan Note (Signed)
Suspicious of seronegative RA or other inflammatory arthritis; check labs and refer back to Dr. Meda Coffee; stop ibuprofen; try meloxicam

## 2017-07-11 NOTE — Assessment & Plan Note (Signed)
Recheck CRP, nonspecific marker

## 2017-07-11 NOTE — Progress Notes (Signed)
BP 122/72   Pulse 95   Temp 98.2 F (36.8 C) (Oral)   Resp 16   Wt 189 lb 8 oz (86 kg)   SpO2 94%   BMI 33.57 kg/m    Subjective:    Patient ID: Yolanda Pace, female    DOB: 01-Sep-1966, 51 y.o.   MRN: [id number]  HPI: Yolanda Pace is a 51 y.o. female  Chief Complaint  Patient presents with  . Joint Pain    HPI  She has had joint pain for years; she saw the rheumatologist, and checked her hands Said it's OA, not RA Joints are aching all the time Sometimes so bad cannot even get up Sometimes wakes up hurting; so bad she cannot get off the couch some days She had gotten clonazepam for sleep in the past; she would take a half of a pill to just help her sleep She wants to figure out what is going on Her shoulders, knees, wrists, ankles, hips, hands Back hurts all the time any way; left hip down to toes hurts and is numb; had a mesh bladder sling put in about 15 years and has had that problem ever since; not new Taking ibuprofen for pain, for joint pain; not helping her now very much; there have been days this summer when she couldn't even get off of the couch Not one to lay around Not sure if she has been checked for Lyme disease Taking vitamin D; last level was 28 on July 11th Had thumb injection and was wearing splints, just wears now sometimes  She did hand xrays and left shoulder xray Dr. Annalee Genta recommended she see ortho about the shoulder, but it was injured at work; even when for nerve block but went under and had a seizure  Dr. Meda Coffee did labs which showed negative CCP Ab, elevated CRP, mildly elevated beta-globuling Reviewed; meds; on ibuprofen 600 mg PRN; can't recall name of other NSAIDs No one on mother's side has rheumatologic conditions She has high cholesterol; she is on allergy medicine and antidepressant; no statin Lab Results  Component Value Date   CHOL 231 (H) 05/25/2017   CHOL 206 (H) 05/07/2016   CHOL 191 10/17/2015   Lab Results  Component  Value Date   HDL 49 (L) 05/25/2017   HDL 45 (L) 05/07/2016   HDL 41 10/17/2015   Lab Results  Component Value Date   LDLCALC 153 (H) 05/25/2017   LDLCALC 127 05/07/2016   LDLCALC 118 (H) 10/17/2015   Lab Results  Component Value Date   TRIG 145 05/25/2017   TRIG 170 (H) 05/07/2016   TRIG 160 (H) 10/17/2015   Lab Results  Component Value Date   CHOLHDL 4.7 05/25/2017   CHOLHDL 4.6 05/07/2016   No results found for: LDLDIRECT   Depression screen [facility] 2/9 07/11/2017 05/06/2017 02/25/2017 11/09/2016 05/07/2016  Decreased Interest 0 0 0 0 0  Down, Depressed, Hopeless 0 0 0 0 0  PHQ - 2 Score 0 0 0 0 0    Relevant past medical, surgical, family and social history reviewed Past Medical History:  Diagnosis Date  . Allergic rhinitis   . Anxiety   . Asthma   . Cystic thyroid nodule March 2015   60mm on u/s  . Depression   . Insomnia   . Obesity   . Right thyroid nodule 05/26/2017   Solid 1 cm RIGHT lobe July 2018  . Seizures [facility]) June 2015   provoked after cervical nerve  block  . Skin cancer Aug 2015   removed from back  . Vitamin D deficiency disease    Past Surgical History:  Procedure Laterality Date  . ABDOMINAL HYSTERECTOMY  2005ish   fibroids, ovaries remain; along with bladder tack  . bladder tack  2005ish  . CHOLECYSTECTOMY    . SKIN CANCER EXCISION  Aug 2015   removed from back   Family History  Problem Relation Age of Onset  . Hyperlipidemia Mother   . Diabetes Sister   . Hyperlipidemia Sister   . Hypertension Sister   . Hypertension Brother   . Pneumonia Maternal Grandmother   . Heart attack Maternal Grandfather   . Diabetes Sister   . Hypertension Sister   . Epilepsy Sister   . Hypertension Sister   . Hypertension Daughter   . Kidney Stones Daughter   . Cancer Neg Hx   . COPD Neg Hx   . Heart disease Neg Hx   . Stroke Neg Hx    Social History   Social History  . Marital status: Married    Spouse name: N/A  . Number of children: N/A  .  Years of education: N/A   Occupational History  . Not on file.   Social History Main Topics  . Smoking status: Never Smoker  . Smokeless tobacco: Never Used  . Alcohol use No  . Drug use: No  . Sexual activity: Yes   Other Topics Concern  . Not on file   Social History Narrative  . No narrative on file    Interim medical history since last visit reviewed. Allergies and medications reviewed  Review of Systems Per HPI unless specifically indicated above     Objective:    BP 122/72   Pulse 95   Temp 98.2 F (36.8 C) (Oral)   Resp 16   Wt 189 lb 8 oz (86 kg)   SpO2 94%   BMI 33.57 kg/m   Wt Readings from Last 3 Encounters:  07/11/17 189 lb 8 oz (86 kg)  05/06/17 188 lb 3.2 oz (85.4 kg)  02/25/17 187 lb 6.4 oz (85 kg)    Physical Exam  Constitutional: She appears well-developed and well-nourished. No distress.  Eyes: No scleral icterus.  Neck: No thyromegaly present.  Cardiovascular: Normal rate.   Pulmonary/Chest: Effort normal.  Musculoskeletal:       Right knee: Tenderness found.       Left knee: Tenderness found.       Right hand: She exhibits tenderness and swelling.       Left hand: She exhibits tenderness and swelling.       Hands: Neurological: She is alert.  Skin: No pallor.  Psychiatric: She has a normal mood and affect.    Results for orders placed or performed in visit on 05/25/17  COMPLETE METABOLIC PANEL WITH GFR  Result Value Ref Range   Sodium 141 135 - 146 mmol/L   Potassium 4.3 3.5 - 5.3 mmol/L   Chloride 106 98 - 110 mmol/L   CO2 23 20 - 31 mmol/L   Glucose, Bld 85 65 - 99 mg/dL   BUN 13 7 - 25 mg/dL   Creat 0.60 0.50 - 1.05 mg/dL   Total Bilirubin 0.6 0.2 - 1.2 mg/dL   Alkaline Phosphatase 80 33 - 130 U/L   AST 15 10 - 35 U/L   ALT 18 6 - 29 U/L   Total Protein 6.8 6.1 - 8.1 g/dL   Albumin 4.2 3.6 -  5.1 g/dL   Calcium 9.4 8.6 - 10.4 mg/dL   GFR, Est African American >89 >=60 mL/min   GFR, Est Non African American >89 >=60  mL/min  Lipid panel  Result Value Ref Range   Cholesterol 231 (H) <200 mg/dL   Triglycerides 145 <150 mg/dL   HDL 49 (L) >50 mg/dL   Total CHOL/HDL Ratio 4.7 <5.0 Ratio   VLDL 29 <30 mg/dL   LDL Cholesterol 153 (H) <100 mg/dL  TSH  Result Value Ref Range   TSH 1.52 mIU/L  Hemoglobin A1c  Result Value Ref Range   Hgb A1c MFr Bld 5.3 <5.7 %   Mean Plasma Glucose 105 mg/dL  VITAMIN D 25 Hydroxy (Vit-D Deficiency, Fractures)  Result Value Ref Range   Vit D, 25-Hydroxy 28 (L) 30 - 100 ng/mL      Assessment & Plan:   Problem List Items Addressed This Visit      Other   Pain, joint, multiple sites    Suspicious of seronegative RA or other inflammatory arthritis; check labs and refer back to Dr. Meda Coffee; stop ibuprofen; try meloxicam      Relevant Orders   C-reactive protein   ANA w/Reflex if Positive   Lyme Ab/Western Blot Reflex   Elevated C-reactive protein (CRP)    Recheck CRP, nonspecific marker      Relevant Orders   C-reactive protein   Elevated beta-2 microglobulin    Recheck UPEP and SPEP, IFE      Relevant Orders   UPEP/UIFE/Light Chains/TP, 24-Hr Ur   Protein Electrophoresis, (serum)       Follow up plan: No Follow-up on file.  An after-visit summary was printed and given to the patient at [place].  Please see the patient instructions which may contain other information and recommendations beyond what is mentioned above in the assessment and plan.  Meds ordered this encounter  Medications  . meloxicam (MOBIC) 15 MG tablet    Sig: Take 1 tablet (15 mg total) by mouth daily as needed for pain. Take with food; okay to take tylenol    Dispense:  30 tablet    Refill:  1    Orders Placed This Encounter  Procedures  . C-reactive protein  . UPEP/UIFE/Light Chains/TP, 24-Hr Ur  . ANA w/Reflex if Positive  . Lyme Ab/Western Blot Reflex  . Protein Electrophoresis, (serum)

## 2017-07-11 NOTE — Assessment & Plan Note (Signed)
Recheck UPEP and SPEP, IFE

## 2017-07-11 NOTE — Patient Instructions (Signed)
Stop ibuprofen Start new NSAID called meloxicam Take with food Okay to take tylenol with this but no other NSAIDs We'll get labs today Call if not getting better Try turmeric as a natural anti-inflammatory (for pain and arthritis). It comes in capsules where you buy aspirin and fish oil, but also as a spice where you buy pepper and garlic powder.

## 2017-07-13 LAB — PROTEIN ELECTROPHORESIS, SERUM
A/G RATIO SPE: 1.2 (ref 0.7–1.7)
ALBUMIN ELP: 3.8 g/dL (ref 2.9–4.4)
Alpha 1: 0.2 g/dL (ref 0.0–0.4)
Alpha 2: 0.7 g/dL (ref 0.4–1.0)
BETA: 1.3 g/dL (ref 0.7–1.3)
Gamma Globulin: 0.9 g/dL (ref 0.4–1.8)
Globulin, Total: 3.2 g/dL (ref 2.2–3.9)
Total Protein: 7 g/dL (ref 6.0–8.5)

## 2017-07-13 LAB — LYME AB/WESTERN BLOT REFLEX: Lyme IgG/IgM Ab: <0.91 {ISR} (ref 0.00–0.90)

## 2017-07-13 LAB — C-REACTIVE PROTEIN: CRP: 9.2 mg/L — ABNORMAL HIGH (ref 0.0–4.9)

## 2017-09-29 ENCOUNTER — Encounter: Payer: Self-pay | Admitting: Family Medicine

## 2017-09-29 ENCOUNTER — Ambulatory Visit: Payer: BC Managed Care – PPO | Admitting: Family Medicine

## 2017-09-29 VITALS — BP 138/76 | HR 87 | Temp 98.2°F | Resp 16 | Ht 63.0 in | Wt 189.1 lb

## 2017-09-29 DIAGNOSIS — M2550 Pain in unspecified joint: Secondary | ICD-10-CM

## 2017-09-29 DIAGNOSIS — Z791 Long term (current) use of non-steroidal anti-inflammatories (NSAID): Secondary | ICD-10-CM | POA: Diagnosis not present

## 2017-09-29 DIAGNOSIS — R7982 Elevated C-reactive protein (CRP): Secondary | ICD-10-CM

## 2017-09-29 DIAGNOSIS — M2540 Effusion, unspecified joint: Secondary | ICD-10-CM | POA: Diagnosis not present

## 2017-09-29 NOTE — Progress Notes (Signed)
Name: Yolanda Pace   MRN: [id number]    DOB: 07/04/1966   Date:09/29/2017       Progress Note  Subjective  Chief Complaint  Chief Complaint  Patient presents with  . Joint Swelling    geberalized joint pain,     HPI  Patient presents for joint pain, swelling, and erythema: - She has been seen by Dr. Sanda Klein in August 2018 for this and had an elevated CRP, neagtive Lime titer, negative for M-spike.  Did not have other labs completed at that visit. We will perform these today - Hands, knees, feet, shoulders, neck, wrists are all painful, intermittently warm and red with some swelling. - She took meloxicam and this did not help her pain.  She still takes ibuprofen - it does not take away her pain but it allows her to function. She will take 600mg  in the morning, then 400mg  at 1500, then 600mg  before bed. - She has seen Dr. Meda Coffee in the past and is willing to return for further evaluation. - She works as a Consulting civil engineer for kindergarten.  Patient Active Problem List   Diagnosis Date Noted  . 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 Elevated C-reactive protein (CRP) 07/11/2017  . Right thyroid nodule 05/26/2017  . Mild hypercholesterolemia 05/06/2017  . Medication monitoring encounter 05/06/2017  . Heartburn 11/16/2016  . Pain, joint, multiple sites 05/07/2016  . Preventative health care 10/18/2015  . Elevated blood pressure reading 10/18/2015  . Breast cancer screening 10/17/2015  . Asthma   . Anxiety   . Depression   . Allergic rhinitis   . Insomnia   . Obesity   . Vitamin D deficiency disease   . Cystic thyroid nodule 01/13/2014    Social History   Tobacco Use  . Smoking status: Never Smoker  . Smokeless tobacco: Never Used  Substance Use Topics  . Alcohol use: No     Current Outpatient Medications:  .  albuterol (PROAIR HFA) 108 (90 BASE) MCG/ACT inhaler, Inhale 2 puffs into the lungs every 4 (four) hours as needed for wheezing or shortness of breath., Disp: ,  Rfl:  .  albuterol (PROVENTIL) (2.5 MG/3ML) 0.083% nebulizer solution, Inhale 2.5 mg into the lungs., Disp: , Rfl:  .  fluticasone (FLONASE) 50 MCG/ACT nasal spray, Place 2 sprays into the nose daily., Disp: , Rfl:  .  levocetirizine (XYZAL) 5 MG tablet, Take 1 tablet (5 mg total) by mouth every evening., Disp: 90 tablet, Rfl: 3 .  montelukast (SINGULAIR) 10 MG tablet, Take 1 tablet (10 mg total) by mouth at bedtime., Disp: 90 tablet, Rfl: 3 .  omeprazole (PRILOSEC) 20 MG capsule, Take 20 mg by mouth daily., Disp: , Rfl:  .  cholecalciferol (VITAMIN D) 1000 units tablet, Take 1,000 Units by mouth daily., Disp: , Rfl:  .  clonazePAM (KLONOPIN) 0.5 MG tablet, TAKE ONE-HALF TABLET BY MOUTH AT BEDTIME AS NEEDED *USE SPARINGLY* (Patient not taking: Reported on 09/29/2017), Disp: 10 tablet, Rfl: 0 .  hyoscyamine (LEVSIN SL) 0.125 MG SL tablet, Place 1 tablet (0.125 mg total) under the tongue every 4 (four) hours as needed. (Patient not taking: Reported on 09/29/2017), Disp: 30 tablet, Rfl: 0 .  meloxicam (MOBIC) 15 MG tablet, Take 1 tablet (15 mg total) by mouth daily as needed for pain. Take with food; okay to take tylenol (Patient not taking: Reported on 09/29/2017), Disp: 30 tablet, Rfl: 1 .  Olopatadine HCl 0.2 % SOLN, Apply 1 drop to eye daily. Each eye  daily (Patient not taking: Reported on 09/29/2017), Disp: 7.5 mL, Rfl: 3 .  sertraline (ZOLOFT) 50 MG tablet, Take 0.5-1 tablets (25-50 mg total) by mouth daily. (Patient not taking: Reported on 09/29/2017), Disp: 30 tablet, Rfl: 2  Allergies  Allergen Reactions  . Ioxaglate Swelling    Noted in [facility] on 01/06/2012. Premedicate patient prior to contrast media injections.  . Codeine Nausea And Vomiting  . Compazine  [Prochlorperazine] Other (See Comments)    Jaws locked up  . Contrast Media [Iodinated Diagnostic Agents] Swelling    Noted in [facility]. chart on 01/06/2012. Premedicate patient prior to contrast media injections.  .  Doxycycline Nausea Only    GI upset  . Iodine Swelling  . Prochlorperazine Edisylate Other (See Comments)    Jaws locked up  . Sulfur Other (See Comments)    ROS Constitutional: Negative for fever or weight change.  Respiratory: Negative for cough and shortness of breath.   Cardiovascular: Negative for chest pain or palpitations.  Gastrointestinal: Negative for abdominal pain, no bowel changes.  Musculoskeletal: See HPI Skin: Negative for rash.  Neurological: Negative for dizziness or headache.  No other specific complaints in a complete review of systems (except as listed in HPI above).  Objective  Vitals:   09/29/17 1514  BP: 138/76  Pulse: 87  Resp: 16  Temp: 98.2 F (36.8 C)  TempSrc: Oral  SpO2: 96%  Weight: 189 lb 1.6 oz (85.8 kg)  Height: 5\' 3"  (1.6 m)   Body mass index is 33.5 kg/m.  Nursing Note and Vital Signs reviewed.  Physical Exam  Constitutional: Patient appears well-developed and well-nourished. Obese No distress.  HEENT: head atraumatic, normocephalic Cardiovascular: Normal rate, regular rhythm, S1/S2 present.  No murmur or rub heard. No BLE edema. Pulmonary/Chest: Effort normal and breath sounds clear. No respiratory distress or retractions. Psychiatric: Patient has a normal mood and affect. behavior is normal. Judgment and thought content normal. Musculoskeletal: Bilateral knees, hands and wrists, ankles and feet, shoulders, and neck are mildly tender to palpation and mildly erythematous. Minimal swelling noted. Radial and pedal pulses +2 bilaterally.  RIGHT knee with crepitus present.   Recent Results (from the past 2160 hour(s))  C-reactive protein     Status: Abnormal   Collection Time: 07/11/17  4:28 PM  Result Value Ref Range   CRP 9.2 (H) 0.0 - 4.9 mg/L  Lyme Ab/Western Blot Reflex     Status: None   Collection Time: 07/11/17  4:28 PM  Result Value Ref Range   Lyme IgG/IgM Ab <0.91 0.00 - 0.90 ISR    Comment:                                  Negative         <0.91                                 Equivocal  0.91 - 1.09                                 Positive         >1.09    LYME DISEASE AB, QUANT, IGM <0.80 0.00 - 0.79 index    Comment:  Negative         <0.80                                 Equivocal  0.80 - 1.19                                 Positive         >1.19  IgM levels may peak at 3-6 weeks post infection, then  gradually decline.   Protein Electrophoresis, (serum)     Status: None   Collection Time: 07/11/17  4:28 PM  Result Value Ref Range   Total Protein 7.0 6.0 - 8.5 g/dL   Albumin ELP 3.8 2.9 - 4.4 g/dL   Alpha 1 0.2 0.0 - 0.4 g/dL   Alpha 2 0.7 0.4 - 1.0 g/dL   Beta 1.3 0.7 - 1.3 g/dL   Gamma Globulin 0.9 0.4 - 1.8 g/dL   M-Spike, % Not Observed Not Observed g/dL   GLOBULIN, TOTAL 3.2 2.2 - 3.9 g/dL   A/G Ratio 1.2 0.7 - 1.7   Please Note: Comment     Comment: Protein electrophoresis scan will follow via computer, mail, or courier delivery.    Interpretation: Comment     Comment: The SPE pattern appears essentially unremarkable. Evidence of monoclonal protein is not apparent.      Assessment & Plan  1. Pain, joint, multiple sites - C-reactive protein - COMPLETE METABOLIC PANEL WITH GFR - Ambulatory referral to Rheumatology - ANA,IFA RA Diag Pnl w/rflx Tit/Patn  2. Elevated C-reactive protein (CRP) - C-reactive protein - Ambulatory referral to Rheumatology - ANA,IFA RA Diag Pnl w/rflx Tit/Patn  3. Swelling of joint of multiple sites - C-reactive protein - COMPLETE METABOLIC PANEL WITH GFR - Ambulatory referral to Rheumatology - ANA,IFA RA Diag Pnl w/rflx Tit/Patn  4. NSAID long-term use - COMPLETE METABOLIC PANEL WITH GFR  - Advised may continue ibuprofen pending lab testing for kidney function, but she should try to decrease dosing. Recommend tylenol supplementation if possible.

## 2017-09-29 NOTE — Patient Instructions (Addendum)
Musculoskeletal Pain  Musculoskeletal pain is muscle and bone aches and pains. This pain can occur in any part of the body.  Follow these instructions at home:  · Only take medicines for pain, discomfort, or fever as told by your health care provider.  · You may continue all activities unless the activities cause more pain. When the pain lessens, slowly resume normal activities. Gradually increase the intensity and duration of the activities or exercise.  · During periods of severe pain, bed rest may be helpful. Lie or sit in any position that is comfortable, but get out of bed and walk around at least every several hours.  · If directed, put ice on the injured area.  ? Put ice in a plastic bag.  ? Place a towel between your skin and the bag.  ? Leave the ice on for 20 minutes, 2-3 times a day.  Contact a health care provider if:  · Your pain is getting worse.  · Your pain is not relieved with medicines.  · You lose function in the area of the pain if the pain is in your arms, legs, or neck.  This information is not intended to replace advice given to you by your health care provider. Make sure you discuss any questions you have with your health care provider.  Document Released: 11/01/2005 Document Revised: 04/13/2016 Document Reviewed: 07/06/2013  Elsevier Interactive Patient Education © 2017 [employer]

## 2017-09-30 ENCOUNTER — Other Ambulatory Visit: Payer: Self-pay | Admitting: Family Medicine

## 2017-09-30 ENCOUNTER — Telehealth: Payer: Self-pay | Admitting: Family Medicine

## 2017-09-30 DIAGNOSIS — M2550 Pain in unspecified joint: Secondary | ICD-10-CM

## 2017-09-30 LAB — COMPLETE METABOLIC PANEL WITH GFR
AG Ratio: 1.7 (calc) (ref 1.0–2.5)
ALT: 22 U/L (ref 6–29)
AST: 15 U/L (ref 10–35)
Albumin: 4.3 g/dL (ref 3.6–5.1)
Alkaline phosphatase (APISO): 75 U/L (ref 33–130)
BILIRUBIN TOTAL: 0.4 mg/dL (ref 0.2–1.2)
BUN: 17 mg/dL (ref 7–25)
CHLORIDE: 105 mmol/L (ref 98–110)
CO2: 28 mmol/L (ref 20–32)
Calcium: 9.5 mg/dL (ref 8.6–10.4)
Creat: 0.66 mg/dL (ref 0.50–1.05)
GFR, EST AFRICAN AMERICAN: 119 mL/min/{1.73_m2} (ref >=60)
GFR, Est Non African American: 102 mL/min/{1.73_m2} (ref >=60)
GLUCOSE: 88 mg/dL (ref 65–139)
Globulin: 2.5 g/dL (calc) (ref 1.9–3.7)
Potassium: 4.3 mmol/L (ref 3.5–5.3)
Sodium: 141 mmol/L (ref 135–146)
TOTAL PROTEIN: 6.8 g/dL (ref 6.1–8.1)

## 2017-09-30 LAB — C-REACTIVE PROTEIN: CRP: 6.7 mg/L (ref <8.0)

## 2017-09-30 LAB — ANA,IFA RA DIAG PNL W/RFLX TIT/PATN
Anti Nuclear Antibody(ANA): NEGATIVE
CYCLIC CITRULLIN PEPTIDE AB: 22 U — AB

## 2017-09-30 MED ORDER — NABUMETONE 500 MG PO TABS: 500.0000 mg | ORAL_TABLET | Freq: Two times a day (BID) | ORAL | 1 refills | Status: DC | PRN

## 2017-09-30 NOTE — Telephone Encounter (Signed)
Erroneous

## 2017-10-18 ENCOUNTER — Encounter: Payer: Self-pay | Admitting: Family Medicine

## 2017-10-18 ENCOUNTER — Ambulatory Visit: Payer: BC Managed Care – PPO | Admitting: Family Medicine

## 2017-10-18 VITALS — BP 144/86 | HR 94 | Temp 98.6°F | Resp 16 | Wt 186.7 lb

## 2017-10-18 DIAGNOSIS — R062 Wheezing: Secondary | ICD-10-CM | POA: Diagnosis not present

## 2017-10-18 DIAGNOSIS — R05 Cough: Secondary | ICD-10-CM

## 2017-10-18 DIAGNOSIS — R059 Cough, unspecified: Secondary | ICD-10-CM

## 2017-10-18 DIAGNOSIS — J209 Acute bronchitis, unspecified: Secondary | ICD-10-CM | POA: Diagnosis not present

## 2017-10-18 DIAGNOSIS — M2550 Pain in unspecified joint: Secondary | ICD-10-CM | POA: Diagnosis not present

## 2017-10-18 MED ORDER — BENZONATATE 100 MG PO CAPS: 100.0000 mg | ORAL_CAPSULE | Freq: Three times a day (TID) | ORAL | 0 refills | Status: DC | PRN

## 2017-10-18 MED ORDER — ALBUTEROL SULFATE (2.5 MG/3ML) 0.083% IN NEBU: 2.5000 mg | INHALATION_SOLUTION | RESPIRATORY_TRACT | 1 refills | Status: AC | PRN

## 2017-10-18 MED ORDER — PREDNISONE 20 MG PO TABS: ORAL_TABLET | ORAL | 0 refills | Status: AC

## 2017-10-18 MED ORDER — ALBUTEROL SULFATE HFA 108 (90 BASE) MCG/ACT IN AERS
2.0000 | INHALATION_SPRAY | RESPIRATORY_TRACT | 2 refills | Status: DC | PRN
Start: 2017-10-18 — End: 2020-10-31

## 2017-10-18 MED ORDER — MOMETASONE FUROATE 50 MCG/ACT NA SUSP: 2.0000 | Freq: Every day | NASAL | 12 refills | Status: DC

## 2017-10-18 NOTE — Assessment & Plan Note (Signed)
Briefly reviewed the first two pages of the article she brought; I cannot testify to whether or not her mesh has anything to do with her asthma or aches; she is welcome to speak with a rheumatologist to have testing for an auto-immune condition if she thinks this might be the case

## 2017-10-18 NOTE — Progress Notes (Signed)
BP (!) 144/86   Pulse 94   Temp 98.6 F (37 C) (Oral)   Resp 16   Wt 186 lb 11.2 oz (84.7 kg)   SpO2 98%   BMI 33.07 kg/m    Subjective:    Patient ID: Yolanda Pace, female    DOB: June 06, 1966, 51 y.o.   MRN: [id number]  HPI: Yolanda Pace is a 51 y.o. female  Chief Complaint  Patient presents with  . URI    cough, congestion, headache, sore throat and ear pain.  Onset 4 days  . 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 Ulyess Mort and will bring that back in December  Depression screen [facility] 2/9 10/18/2017 07/11/2017 05/06/2017 02/25/2017 11/09/2016  Decreased Interest 0 0 0 0 0  Down, Depressed, Hopeless 0 0 0 0 0  PHQ - 2 Score 0 0 0 0 0    Relevant past medical, surgical, family and social history reviewed Past Medical History:  Diagnosis Date  . Allergic rhinitis   . Anxiety   . Asthma   . Cystic thyroid nodule March 2015   39mm on u/s  . Depression   . Insomnia   . Obesity   . Right thyroid nodule 05/26/2017   Solid 1 cm RIGHT lobe July 2018  . Seizures [facility]) June 2015   provoked after cervical nerve block  . Skin cancer Aug 2015   removed from back  . Vitamin D deficiency disease    Past Surgical History:  Procedure Laterality Date  . ABDOMINAL HYSTERECTOMY  2005ish   fibroids, ovaries remain; along with bladder tack  . bladder tack  2005ish  .  CHOLECYSTECTOMY    . SKIN CANCER EXCISION  Aug 2015   removed from back   Family History  Problem Relation Age of Onset  . Hyperlipidemia Mother   . Diabetes Sister   . Hyperlipidemia Sister   . Hypertension Sister   . Hypertension Brother   . Pneumonia Maternal Grandmother   . Heart attack Maternal Grandfather   . Diabetes Sister   . Hypertension Sister   . Epilepsy Sister   . Hypertension Sister   . Hypertension Daughter   . Kidney Stones Daughter   . Cancer Neg Hx   . COPD Neg Hx   . Heart disease Neg Hx   . Stroke Neg Hx    Social History   Tobacco Use  . Smoking status: Never Smoker  . Smokeless tobacco: Never Used  Substance Use Topics  . Alcohol use: No  . Drug use: No   Interim medical history since last visit reviewed. Allergies and medications reviewed  Review of Systems Per HPI unless specifically indicated above     Objective:    BP (!) 144/86   Pulse 94   Temp 98.6 F (37 C) (Oral)  Resp 16   Wt 186 lb 11.2 oz (84.7 kg)   SpO2 98%   BMI 33.07 kg/m   Wt Readings from Last 3 Encounters:  10/18/17 186 lb 11.2 oz (84.7 kg)  09/29/17 189 lb 1.6 oz (85.8 kg)  07/11/17 189 lb 8 oz (86 kg)    Physical Exam  Constitutional: She appears well-developed and well-nourished. No distress.  Obese; frequent coughing but nontoxic, no distress; able to speak in complete sentences  HENT:  Right Ear: Tympanic membrane and ear canal normal. No drainage. Tympanic membrane is not erythematous. No middle ear effusion.  Left Ear: Tympanic membrane and ear canal normal. No drainage. Tympanic membrane is not erythematous.  No middle ear effusion.  Nose: Rhinorrhea (clear) present.  Mouth/Throat: Oropharynx is clear and moist and mucous membranes are normal. No posterior oropharyngeal edema or posterior oropharyngeal erythema.  Eyes: EOM are normal. No scleral icterus.  Cardiovascular: Normal rate and regular rhythm.  Pulmonary/Chest: Effort normal. No accessory  muscle usage. No respiratory distress. She has no decreased breath sounds. She has wheezes. She has no rhonchi. She has no rales.  Abdominal: She exhibits no distension.  Musculoskeletal: She exhibits no edema.  Lymphadenopathy:    She has no cervical adenopathy.  Skin: She is not diaphoretic. No pallor.  Psychiatric: She has a normal mood and affect. Her behavior is normal.    Results for orders placed or performed in visit on 09/29/17  C-reactive protein  Result Value Ref Range   CRP 6.7 <8.0 mg/L  COMPLETE METABOLIC PANEL WITH GFR  Result Value Ref Range   Glucose, Bld 88 65 - 139 mg/dL   BUN 17 7 - 25 mg/dL   Creat 0.66 0.50 - 1.05 mg/dL   GFR, Est Non African American 102 > OR = 60 mL/min/1.10m2   GFR, Est African American 119 > OR = 60 mL/min/1.31m2   BUN/Creatinine Ratio NOT APPLICABLE 6 - 22 (calc)   Sodium 141 135 - 146 mmol/L   Potassium 4.3 3.5 - 5.3 mmol/L   Chloride 105 98 - 110 mmol/L   CO2 28 20 - 32 mmol/L   Calcium 9.5 8.6 - 10.4 mg/dL   Total Protein 6.8 6.1 - 8.1 g/dL   Albumin 4.3 3.6 - 5.1 g/dL   Globulin 2.5 1.9 - 3.7 g/dL (calc)   AG Ratio 1.7 1.0 - 2.5 (calc)   Total Bilirubin 0.4 0.2 - 1.2 mg/dL   Alkaline phosphatase (APISO) 75 33 - 130 U/L   AST 15 10 - 35 U/L   ALT 22 6 - 29 U/L  ANA,IFA RA Diag Pnl w/rflx Tit/Patn  Result Value Ref Range   Anit Nuclear Antibody(ANA) NEGATIVE NEGATIVE   Rhuematoid fact SerPl-aCnc <25 <42 IU/mL   Cyclic Citrullin Peptide Ab 22 (H) UNITS   INTERPRETATION        Assessment & Plan:   Problem List Items Addressed This Visit      Other   Pain, joint, multiple sites    Briefly reviewed the first two pages of the article she brought; I cannot testify to whether or not her mesh has anything to do with her asthma or aches; she is welcome to speak with a rheumatologist to have testing for an auto-immune condition if she thinks this might be the case       Other Visit Diagnoses    Acute bronchitis, unspecified  organism    -  Primary   explained that she is contagious; usually viral; no  antibiotics needed; treat with prednisone and tessalon perles and rest   Wheezing       prednisone, albuterol neb or MDI/HFA   Cough       secondary to bronchitis; I do not think an antibiotic is indicated; discouraged her from using left-over narcotic cough syrup, resp suppression can occur       Follow up plan: No Follow-up on file.  An after-visit summary was printed and given to the patient at [facility].  Please see the patient instructions which may contain other information and recommendations beyond what is mentioned above in the assessment and plan.  Meds ordered this encounter  Medications  . albuterol (PROVENTIL) (2.5 MG/3ML) 0.083% nebulizer solution    Sig: Take 3 mLs (2.5 mg total) by nebulization every 4 (four) hours as needed for wheezing or shortness of breath.    Dispense:  75 mL    Refill:  1  . predniSONE (DELTASONE) 20 MG tablet    Sig: Three pills by mouth daily for two days, then 2 pills daily for 2 days, then 1 pill daily for 2 days    Dispense:  12 tablet    Refill:  0  . albuterol (PROAIR HFA) 108 (90 Base) MCG/ACT inhaler    Sig: Inhale 2 puffs into the lungs every 4 (four) hours as needed for wheezing or shortness of breath.    Dispense:  1 Inhaler    Refill:  2  . benzonatate (TESSALON PERLES) 100 MG capsule    Sig: Take 1 capsule (100 mg total) by mouth every 8 (eight) hours as needed for cough.    Dispense:  30 capsule    Refill:  0  . mometasone (NASONEX) 50 MCG/ACT nasal spray    Sig: Place 2 sprays into the nose daily.    Dispense:  17 g    Refill:  12    No orders of the defined types were placed in this encounter.

## 2017-10-18 NOTE — Patient Instructions (Addendum)
Try vitamin C (orange juice if not diabetic or vitamin C tablets) and drink green tea to help your immune system during your illness Get plenty of rest and hydration Out of work today and tomorrow, call if further absence needed Start the prednisone today as soon as possible, take with food Use the tessalon perles for cough suppression I'll recommend that you DON'T use any narcotic cough syrups Keep the next appointment You are contagious, so please stay away from public areas, nursing homes, schools, etc until feeling better   Bronchospasm, Adult Bronchospasm is a tightening of the airways going into the lungs. During an episode, it may be harder to breathe. You may cough, and you may make a whistling sound when you breathe (wheeze). This condition often affects people with asthma. What are the causes? This condition is caused by swelling and irritation in the airways. It can be triggered by:  An infection (common).  Seasonal allergies.  An allergic reaction.  Exercise.  Irritants. These include pollution, cigarette smoke, strong odors, aerosol sprays, and paint fumes.  Weather changes. Winds increase molds and pollens in the air. Cold air may cause swelling.  Stress and emotional upset.  What are the signs or symptoms? Symptoms of this condition include:  Wheezing. If the episode was triggered by an allergy, wheezing may start right away or hours later.  Nighttime coughing.  Frequent or severe coughing with a simple cold.  Chest tightness.  Shortness of breath.  Decreased ability to exercise.  How is this diagnosed? This condition is usually diagnosed with a review of your medical history and a physical exam. Tests, such as lung function tests, are sometimes done to look for other conditions. The need for a chest X-ray depends on where the wheezing occurs and whether it is the first time you have wheezed. How is this treated? This condition may be treated  with:  Inhaled medicines. These open up the airways and help you breathe. They can be taken with an inhaler or a nebulizer device.  Corticosteroid medicines. These may be given for severe bronchospasm, usually when it is associated with asthma.  Avoiding triggers, such as irritants, infection, or allergies.  Follow these instructions at home: Medicines  Take over-the-counter and prescription medicines only as told by your health care provider.  If you need to use an inhaler or nebulizer to take your medicine, ask your health care provider to explain how to use it correctly. If you were given a spacer, always use it with your inhaler. Lifestyle  Reduce the number of triggers in your home. To do this: ? Change your heating and air conditioning filter at least once a month. ? Limit your use of fireplaces and wood stoves. ? Do not smoke. Do not allow smoking in your home. ? Avoid using perfumes and fragrances. ? Get rid of pests, such as roaches and mice, and their droppings. ? Remove any mold from your home. ? Keep your house clean and dust free. Use unscented cleaning products. ? Replace carpet with wood, tile, or vinyl flooring. Carpet can trap dander and dust. ? Use allergy-proof pillows, mattress covers, and box spring covers. ? Wash bed sheets and blankets every week in hot water. Dry them in a dryer. ? Use blankets that are made of polyester or cotton. ? Wash your hands often. ? Do not allow pets in your bedroom.  Avoid breathing in cold air when you exercise. General instructions  Have a plan for seeking medical care.  Know when to call your health care provider and local emergency services, and where to get emergency care.  Stay up to date on your immunizations.  When you have an episode of bronchospasm, stay calm. Try to relax and breathe more slowly.  If you have asthma, make sure you have an asthma action plan.  Keep all follow-up visits as told by your health care  provider. This is important. Contact a health care provider if:  You have muscle aches.  You have chest pain.  The mucus that you cough up (sputum) changes from clear or white to yellow, green, gray, or bloody.  You have a fever.  Your sputum gets thicker. Get help right away if:  Your wheezing and coughing get worse, even after you take your prescribed medicines.  It gets even harder to breathe.  You develop severe chest pain. Summary  Bronchospasm is a tightening of the airways going into the lungs.  During an episode of bronchospasm, you may have a harder time breathing. You may cough and make a whistling sound when you breathe (wheeze).  Avoid exposure to triggers such as smoke, dust, mold, animal dander, and fragrances.  When you have an episode of bronchospasm, stay calm. Try to relax and breathe more slowly. This information is not intended to replace advice given to you by your health care provider. Make sure you discuss any questions you have with your health care provider. Document Released: 11/04/2003 Document Revised: 10/28/2016 Document Reviewed: 10/28/2016 Elsevier Interactive Patient Education  2017 [employer].

## 2017-11-09 ENCOUNTER — Encounter: Payer: Self-pay | Admitting: Family Medicine

## 2017-11-09 ENCOUNTER — Ambulatory Visit: Payer: BC Managed Care – PPO | Admitting: Family Medicine

## 2017-11-09 VITALS — BP 118/82 | HR 79 | Ht 63.0 in | Wt 189.5 lb

## 2017-11-09 DIAGNOSIS — K219 Gastro-esophageal reflux disease without esophagitis: Secondary | ICD-10-CM | POA: Diagnosis not present

## 2017-11-09 DIAGNOSIS — J4520 Mild intermittent asthma, uncomplicated: Secondary | ICD-10-CM | POA: Diagnosis not present

## 2017-11-09 DIAGNOSIS — E559 Vitamin D deficiency, unspecified: Secondary | ICD-10-CM

## 2017-11-09 DIAGNOSIS — Z6833 Body mass index (BMI) 33.0-33.9, adult: Secondary | ICD-10-CM

## 2017-11-09 DIAGNOSIS — Z5181 Encounter for therapeutic drug level monitoring: Secondary | ICD-10-CM

## 2017-11-09 DIAGNOSIS — E6609 Other obesity due to excess calories: Secondary | ICD-10-CM | POA: Diagnosis not present

## 2017-11-09 DIAGNOSIS — J3089 Other allergic rhinitis: Secondary | ICD-10-CM | POA: Diagnosis not present

## 2017-11-09 DIAGNOSIS — M2550 Pain in unspecified joint: Secondary | ICD-10-CM

## 2017-11-09 DIAGNOSIS — K625 Hemorrhage of anus and rectum: Secondary | ICD-10-CM

## 2017-11-09 DIAGNOSIS — E7800 Pure hypercholesterolemia, unspecified: Secondary | ICD-10-CM

## 2017-11-09 MED ORDER — MONTELUKAST SODIUM 10 MG PO TABS: 10.0000 mg | ORAL_TABLET | Freq: Every day | ORAL | 3 refills | Status: DC

## 2017-11-09 MED ORDER — LEVOCETIRIZINE DIHYDROCHLORIDE 5 MG PO TABS: 5.0000 mg | ORAL_TABLET | Freq: Every evening | ORAL | 3 refills | Status: DC

## 2017-11-09 NOTE — Assessment & Plan Note (Signed)
May have LES dysfunction or hiatal hernia; refer to GI; elevate HOB; continue PPI for now

## 2017-11-09 NOTE — Progress Notes (Signed)
BP 118/82 (BP Location: Left Arm, Patient Position: Sitting, Cuff Size: Normal)   Pulse 79   Ht 5\' 3"  (1.6 m)   Wt 189 lb 8 oz (86 kg)   SpO2 97%   BMI 33.57 kg/m    Subjective:    Patient ID: Yolanda Pace, female    DOB: September 27, 1966, 51 y.o.   MRN: [id number]  HPI: Yolanda Pace is a 51 y.o. female  Chief Complaint  Patient presents with  . Follow-up    Pt here today for 6 month f/u,pt states she is doing well, no complaints     HPI Patient is here for f/u BP well-controlled just with diet; HTN does run in the family  Allergies; on four medicines right now, eye drops just PRN; fall and winter are bad times of year, but some in the spring; asthma also and they go together; using rescue inhaler less; asthma a little worse with new shedding dog  Bronchitis is clearing up; using rescue inhaler much less  Mood is doing well; she is tapering off of the sertraline, down to one-eighth of a pill; just a tiny chunk; tried to stop completely but got a little dizzy, just a little bit at a time; has a few left and thinks she can taper off with what she has; no SI/HI; does have anxiety and depression in the family; not using benzo any more; was an old prescription from years ago  Not taking relafen; still on plaquenil; seeing rheumatologist, but hasn't started the plaquenil yet (needs eye exam)  Still has heartburn and reflux; knows to not take the PPI every day; wakes up with heartburn; not just particular foods; med lasts almost all day; has occasional blood in her stool, but she has hemorrhoids; can feel them when she wipes; BRBPR; had pretty severe vomiting during pregnancy, all through the pregnancy (3 children, all 3 pregnancies)  High cholesterol Lab Results  Component Value Date   CHOL 231 (H) 05/25/2017   HDL 49 (L) 05/25/2017   LDLCALC 153 (H) 05/25/2017   TRIG 145 05/25/2017   CHOLHDL 4.7 05/25/2017   She had bladder mesh surgery; developed sensitivity to medicines, smells,  developed asthma and allergies; never had that before the mesh implanted; pain along the bases of the thumbs, finger joints, knuckles, knees, ankles  Cannot lose weight  Vit D was low the last two checks; taking 1000 iu of OTC a day   Depression screen [facility] 2/9 11/09/2017 10/18/2017 07/11/2017 05/06/2017 02/25/2017  Decreased Interest 0 0 0 0 0  Down, Depressed, Hopeless 0 0 0 0 0  PHQ - 2 Score 0 0 0 0 0    Relevant past medical, surgical, family and social history reviewed Past Medical History:  Diagnosis Date  . Allergic rhinitis   . Anxiety   . Asthma   . Cystic thyroid nodule March 2015   21mm on u/s  . Depression   . Insomnia   . Obesity   . Right thyroid nodule 05/26/2017   Solid 1 cm RIGHT lobe July 2018  . Seizures [facility]) June 2015   provoked after cervical nerve block  . Skin cancer Aug 2015   removed from back  . Vitamin D deficiency disease    Past Surgical History:  Procedure Laterality Date  . ABDOMINAL HYSTERECTOMY  2005ish   fibroids, ovaries remain; along with bladder tack  . bladder tack  2005ish  . CHOLECYSTECTOMY    . SKIN CANCER EXCISION  Aug 2015   removed from back   Family History  Problem Relation Age of Onset  . Hyperlipidemia Mother   . Diabetes Sister   . Hyperlipidemia Sister   . Hypertension Sister   . Hypertension Brother   . Pneumonia Maternal Grandmother   . Heart attack Maternal Grandfather   . Diabetes Sister   . Hypertension Sister   . Epilepsy Sister   . Hypertension Sister   . Hypertension Daughter   . Kidney Stones Daughter   . Cancer Neg Hx   . COPD Neg Hx   . Heart disease Neg Hx   . Stroke Neg Hx    Social History   Tobacco Use  . Smoking status: Never Smoker  . Smokeless tobacco: Never Used  Substance Use Topics  . Alcohol use: No  . Drug use: No    Interim medical history since last visit reviewed. Allergies and medications reviewed  Review of Systems Per HPI unless specifically indicated above       Objective:    BP 118/82 (BP Location: Left Arm, Patient Position: Sitting, Cuff Size: Normal)   Pulse 79   Ht 5\' 3"  (1.6 m)   Wt 189 lb 8 oz (86 kg)   SpO2 97%   BMI 33.57 kg/m   Wt Readings from Last 3 Encounters:  11/09/17 189 lb 8 oz (86 kg)  10/18/17 186 lb 11.2 oz (84.7 kg)  09/29/17 189 lb 1.6 oz (85.8 kg)    Physical Exam  Constitutional: She appears well-developed and well-nourished. No distress.  HENT:  Right Ear: External ear normal.  Left Ear: External ear normal.  Mouth/Throat: Mucous membranes are not dry.  Eyes: No scleral icterus.  Neck: No thyromegaly present.  Cardiovascular: Normal rate.  Pulmonary/Chest: Effort normal.  Musculoskeletal:       Right knee: Tenderness found.       Left knee: Tenderness found.       Right hand: She exhibits tenderness and swelling.       Left hand: She exhibits tenderness and swelling.       Hands: Neurological: She is alert.  Skin: No rash noted. No pallor.  Psychiatric: She has a normal mood and affect. Her mood appears not anxious. She does not exhibit a depressed mood.   Results for orders placed or performed in visit on 09/29/17  C-reactive protein  Result Value Ref Range   CRP 6.7 <8.0 mg/L  COMPLETE METABOLIC PANEL WITH GFR  Result Value Ref Range   Glucose, Bld 88 65 - 139 mg/dL   BUN 17 7 - 25 mg/dL   Creat 0.66 0.50 - 1.05 mg/dL   GFR, Est Non African American 102 > OR = 60 mL/min/1.61m2   GFR, Est African American 119 > OR = 60 mL/min/1.66m2   BUN/Creatinine Ratio NOT APPLICABLE 6 - 22 (calc)   Sodium 141 135 - 146 mmol/L   Potassium 4.3 3.5 - 5.3 mmol/L   Chloride 105 98 - 110 mmol/L   CO2 28 20 - 32 mmol/L   Calcium 9.5 8.6 - 10.4 mg/dL   Total Protein 6.8 6.1 - 8.1 g/dL   Albumin 4.3 3.6 - 5.1 g/dL   Globulin 2.5 1.9 - 3.7 g/dL (calc)   AG Ratio 1.7 1.0 - 2.5 (calc)   Total Bilirubin 0.4 0.2 - 1.2 mg/dL   Alkaline phosphatase (APISO) 75 33 - 130 U/L   AST 15 10 - 35 U/L   ALT 22 6 - 29 U/L  ANA,IFA RA Diag Pnl w/rflx Tit/Patn  Result Value Ref Range   Anit Nuclear Antibody(ANA) NEGATIVE NEGATIVE   Rhuematoid fact SerPl-aCnc <97 <02 IU/mL   Cyclic Citrullin Peptide Ab 22 (H) UNITS   INTERPRETATION        Assessment & Plan:   Problem List Items Addressed This Visit      Respiratory   Asthma    Sounds well-controlled; she does want the pneumonia or flu shots; avoid germ-laden environments      Relevant Medications   montelukast (SINGULAIR) 10 MG tablet   Allergic rhinitis    Avoid triggers; non-shedding dogs; recommend keeping pets out of the bedroom        Digestive   GERD (gastroesophageal reflux disease)    May have LES dysfunction or hiatal hernia; refer to GI; elevate HOB; continue PPI for now      Relevant Orders   Ambulatory referral to Gastroenterology     Other   Vitamin D deficiency disease    Taking 1000 iu daily; will recheck since not normal in the last two years      Relevant Orders   VITAMIN D 25 Hydroxy (Vit-D Deficiency, Fractures)   Pain, joint, multiple sites    Will start plaquenil after eye exam and f/u with rheum      Relevant Orders   VITAMIN D 25 Hydroxy (Vit-D Deficiency, Fractures)   Obesity    Check TSH      Relevant Orders   TSH   Mild hypercholesterolemia    Check lipids today; avoid saturated fats      Relevant Orders   Lipid panel   Medication monitoring encounter    Check labs      Relevant Orders   COMPLETE METABOLIC PANEL WITH GFR    Other Visit Diagnoses    BRBPR (bright red blood per rectum)    -  Primary   Relevant Orders   CBC with Differential/Platelet   Ambulatory referral to Gastroenterology       Follow up plan: Return in about 6 months (around 05/10/2018) for twenty minute follow-up with fasting labs.  An after-visit summary was printed and given to the patient at [place].  Please see the patient instructions which may contain other information and recommendations beyond what is  mentioned above in the assessment and plan.  Meds ordered this encounter  Medications  . levocetirizine (XYZAL) 5 MG tablet    Sig: Take 1 tablet (5 mg total) by mouth every evening.    Dispense:  90 tablet    Refill:  3  . montelukast (SINGULAIR) 10 MG tablet    Sig: Take 1 tablet (10 mg total) by mouth at bedtime.    Dispense:  90 tablet    Refill:  3    Orders Placed This Encounter  Procedures  . COMPLETE METABOLIC PANEL WITH GFR  . CBC with Differential/Platelet  . Lipid panel  . VITAMIN D 25 Hydroxy (Vit-D Deficiency, Fractures)  . TSH  . Ambulatory referral to Gastroenterology

## 2017-11-09 NOTE — Assessment & Plan Note (Signed)
Check labs 

## 2017-11-09 NOTE — Assessment & Plan Note (Signed)
Check lipids today; avoid saturated fats 

## 2017-11-09 NOTE — Patient Instructions (Addendum)
Caution: prolonged use of proton pump inhibitors like omeprazole (Prilosec), pantoprazole (Protonix), esomeprazole (Nexium), and others like Dexilant and Aciphex may increase your risk of pneumonia, Clostridium difficile colitis, osteoporosis, anemia and other health complications Try to limit or avoid triggers like coffee, caffeinated beverages, onions, chocolate, spicy foods, peppermint, acidic foods like pizza, spaghetti sauce, and orange juice Lose weight if you are overweight or obese Try elevating the head of your bed by placing a small wedge between your mattress and box springs to keep acid in the stomach at night instead of coming up into your esophagus Check out the information at familydoctor.org entitled "Nutrition for Weight Loss: What You Need to Know about Fad Diets" Try to lose between 1-2 pounds per week by taking in fewer calories and burning off more calories You can succeed by limiting portions, limiting foods dense in calories and fat, becoming more active, and drinking 8 glasses of water a day (64 ounces) Don't skip meals, especially breakfast, as skipping meals may alter your metabolism Do not use over-the-counter weight loss pills or gimmicks that claim rapid weight loss A healthy BMI (or body mass index) is between 18.5 and 24.9 You can calculate your ideal BMI at the [place] website [employer]fr If you have not heard anything from my staff in a week about any orders/referrals/studies from today, please contact us here to follow-up [phone]

## 2017-11-09 NOTE — Assessment & Plan Note (Signed)
Sounds well-controlled; she does want the pneumonia or flu shots; avoid germ-laden environments

## 2017-11-09 NOTE — Assessment & Plan Note (Signed)
Check TSH 

## 2017-11-09 NOTE — Assessment & Plan Note (Signed)
Taking 1000 iu daily; will recheck since not normal in the last two years

## 2017-11-09 NOTE — Assessment & Plan Note (Signed)
Will start plaquenil after eye exam and f/u with rheum

## 2017-11-09 NOTE — Assessment & Plan Note (Signed)
Avoid triggers; non-shedding dogs; recommend keeping pets out of the bedroom

## 2017-11-10 ENCOUNTER — Telehealth: Payer: Self-pay

## 2017-11-10 ENCOUNTER — Encounter: Payer: Self-pay | Admitting: Gastroenterology

## 2017-11-10 ENCOUNTER — Other Ambulatory Visit: Payer: Self-pay | Admitting: Family Medicine

## 2017-11-10 DIAGNOSIS — E7800 Pure hypercholesterolemia, unspecified: Secondary | ICD-10-CM

## 2017-11-10 DIAGNOSIS — Z5181 Encounter for therapeutic drug level monitoring: Secondary | ICD-10-CM

## 2017-11-10 LAB — COMPLETE METABOLIC PANEL WITH GFR
AG Ratio: 1.9 (calc) (ref 1.0–2.5)
ALBUMIN MSPROF: 4.2 g/dL (ref 3.6–5.1)
ALKALINE PHOSPHATASE (APISO): 72 U/L (ref 33–130)
ALT: 30 U/L — ABNORMAL HIGH (ref 6–29)
AST: 16 U/L (ref 10–35)
BUN: 15 mg/dL (ref 7–25)
CO2: 26 mmol/L (ref 20–32)
Calcium: 9.5 mg/dL (ref 8.6–10.4)
Chloride: 106 mmol/L (ref 98–110)
Creat: 0.55 mg/dL (ref 0.50–1.05)
GFR, EST AFRICAN AMERICAN: 126 mL/min/{1.73_m2} (ref >=60)
GFR, EST NON AFRICAN AMERICAN: 109 mL/min/{1.73_m2} (ref >=60)
GLOBULIN: 2.2 g/dL (ref 1.9–3.7)
Glucose, Bld: 105 mg/dL — ABNORMAL HIGH (ref 65–99)
Potassium: 4.4 mmol/L (ref 3.5–5.3)
Sodium: 140 mmol/L (ref 135–146)
TOTAL PROTEIN: 6.4 g/dL (ref 6.1–8.1)
Total Bilirubin: 0.4 mg/dL (ref 0.2–1.2)

## 2017-11-10 LAB — LIPID PANEL
Cholesterol: 215 mg/dL — ABNORMAL HIGH (ref <200)
HDL: 47 mg/dL — ABNORMAL LOW (ref >50)
LDL Cholesterol (Calc): 142 mg/dL (calc) — ABNORMAL HIGH
NON-HDL CHOLESTEROL (CALC): 168 mg/dL — AB (ref <130)
TRIGLYCERIDES: 132 mg/dL (ref <150)
Total CHOL/HDL Ratio: 4.6 (calc) (ref <5.0)

## 2017-11-10 LAB — CBC WITH DIFFERENTIAL/PLATELET
BASOS ABS: 90 {cells}/uL (ref 0–200)
Basophils Relative: 1.3 %
EOS ABS: 283 {cells}/uL (ref 15–500)
Eosinophils Relative: 4.1 %
HEMATOCRIT: 40.1 % (ref 35.0–45.0)
Hemoglobin: 13.6 g/dL (ref 11.7–15.5)
LYMPHS ABS: 2663 {cells}/uL (ref 850–3900)
MCH: 28.8 pg (ref 27.0–33.0)
MCHC: 33.9 g/dL (ref 32.0–36.0)
MCV: 85 fL (ref 80.0–100.0)
MPV: 9.7 fL (ref 7.5–12.5)
Monocytes Relative: 6.1 %
NEUTROS PCT: 49.9 %
Neutro Abs: 3443 cells/uL (ref 1500–7800)
PLATELETS: 320 10*3/uL (ref 140–400)
RBC: 4.72 10*6/uL (ref 3.80–5.10)
RDW: 12.1 % (ref 11.0–15.0)
TOTAL LYMPHOCYTE: 38.6 %
WBC: 6.9 10*3/uL (ref 3.8–10.8)
WBCMIX: 421 {cells}/uL (ref 200–950)

## 2017-11-10 LAB — TSH: TSH: 1.23 m[IU]/L

## 2017-11-10 LAB — VITAMIN D 25 HYDROXY (VIT D DEFICIENCY, FRACTURES): Vit D, 25-Hydroxy: 27 ng/mL — ABNORMAL LOW (ref 30–100)

## 2017-11-10 MED ORDER — ATORVASTATIN CALCIUM 10 MG PO TABS: 10.0000 mg | ORAL_TABLET | Freq: Every day | ORAL | 1 refills | Status: DC

## 2017-11-10 NOTE — Telephone Encounter (Signed)
-----   Message from Arnetha Courser, MD sent at 11/10/2017  4:26 PM EST ----- Please let the pt know that her glucose was up just a few points, but not diabetes range; just watch diet, limit sweets One liver enzyme is just a point above normal; likely fatty liver; try to manage weight and eat diet low in saturated fats Her cholesterol is high; I'll suggest a medicine to help bring her total and LDL cholesterol down, if she'll agree; that's in addition to exercise, weight management, and healthy eating Let's recheck her fasting lipids and sgpt (ALT) in about 6-8 weeks Her vitamin D is low, so start vitamin D3 and take 2,000 iu daily until about March 1st, then just 1,000 iu daily

## 2017-11-10 NOTE — Telephone Encounter (Signed)
Called pt informed her of normal results. Pt gave verbal understanding.

## 2017-11-10 NOTE — Progress Notes (Signed)
Rx for statin sent; orders for lipids and sgpt in 6-8 weeks

## 2017-11-10 NOTE — Telephone Encounter (Signed)
-----   Message from Arnetha Courser, MD sent at 11/09/2017  5:19 PM EST ----- Guerry Minors, please let the patient know that her CBC and thyroid tests are normal; thank you

## 2017-11-10 NOTE — Telephone Encounter (Signed)
Called pt no answer. Unable to leave message as no voicemail picked up. Will call again. CRM created. Labs routed to [facility].

## 2017-11-11 NOTE — Telephone Encounter (Signed)
Called pt informed her of the information below, pt gave verbal understanding. Agrees to cholesterol medication. States that she will work on diet and exercise. Gave pt verbal encouragement. Pt to f/u in 6-8wks for labs.

## 2017-12-12 ENCOUNTER — Ambulatory Visit: Payer: Self-pay | Admitting: Gastroenterology

## 2017-12-19 ENCOUNTER — Other Ambulatory Visit: Payer: Self-pay

## 2017-12-19 ENCOUNTER — Encounter (INDEPENDENT_AMBULATORY_CARE_PROVIDER_SITE_OTHER): Payer: Self-pay

## 2017-12-19 ENCOUNTER — Ambulatory Visit: Payer: BC Managed Care – PPO | Admitting: Gastroenterology

## 2017-12-19 ENCOUNTER — Encounter: Payer: Self-pay | Admitting: Gastroenterology

## 2017-12-19 VITALS — BP 151/91 | HR 79 | Ht 63.0 in | Wt 189.8 lb

## 2017-12-19 DIAGNOSIS — K921 Melena: Secondary | ICD-10-CM | POA: Diagnosis not present

## 2017-12-19 DIAGNOSIS — K219 Gastro-esophageal reflux disease without esophagitis: Secondary | ICD-10-CM

## 2017-12-19 NOTE — Patient Instructions (Signed)
F/U 3 months High-Fiber Diet Fiber, also called dietary fiber, is a type of carbohydrate found in fruits, vegetables, whole grains, and beans. A high-fiber diet can have many health benefits. Your health care provider may recommend a high-fiber diet to help:  Prevent constipation. Fiber can make your bowel movements more regular.  Lower your cholesterol.  Relieve hemorrhoids, uncomplicated diverticulosis, or irritable bowel syndrome.  Prevent overeating as part of a weight-loss plan.  Prevent heart disease, type 2 diabetes, and certain cancers.  What is my plan? The recommended daily intake of fiber includes:  38 grams for men under age 83.  75 grams for men over age 75.  26 grams for women under age 29.  20 grams for women over age 71.  You can get the recommended daily intake of dietary fiber by eating a variety of fruits, vegetables, grains, and beans. Your health care provider may also recommend a fiber supplement if it is not possible to get enough fiber through your diet. What do I need to know about a high-fiber diet?  Fiber supplements have not been widely studied for their effectiveness, so it is better to get fiber through food sources.  Always check the fiber content on thenutrition facts label of any prepackaged food. Look for foods that contain at least 5 grams of fiber per serving.  Ask your dietitian if you have questions about specific foods that are related to your condition, especially if those foods are not listed in the following section.  Increase your daily fiber consumption gradually. Increasing your intake of dietary fiber too quickly may cause bloating, cramping, or gas.  Drink plenty of water. Water helps you to digest fiber. What foods can I eat? Grains Whole-grain breads. Multigrain cereal. Oats and oatmeal. Brown rice. Barley. Bulgur wheat. [place]. Bran muffins. Popcorn. Rye wafer crackers. Vegetables Sweet potatoes. Spinach. Kale. Artichokes.  Cabbage. Broccoli. Green peas. Carrots. Squash. Fruits Berries. Pears. Apples. Oranges. Avocados. Prunes and raisins. Dried figs. Meats and Other Protein Sources [place], kidney, pinto, and soy beans. Split peas. Lentils. Nuts and seeds. Dairy Fiber-fortified yogurt. Beverages Fiber-fortified soy milk. Fiber-fortified orange juice. Other Fiber bars. The items listed above may not be a complete list of recommended foods or beverages. Contact your dietitian for more options. What foods are not recommended? Grains White bread. Pasta made with refined flour. White rice. Vegetables Fried potatoes. Canned vegetables. Well-cooked vegetables. Fruits Fruit juice. Cooked, strained fruit. Meats and Other Protein Sources Fatty cuts of meat. Fried Sales executive or fried fish. Dairy Milk. Yogurt. Cream cheese. Sour cream. Beverages Soft drinks. Other Cakes and pastries. Butter and oils. The items listed above may not be a complete list of foods and beverages to avoid. Contact your dietitian for more information. What are some tips for including high-fiber foods in my diet?  Eat a wide variety of high-fiber foods.  Make sure that half of all grains consumed each day are whole grains.  Replace breads and cereals made from refined flour or white flour with whole-grain breads and cereals.  Replace white rice with brown rice, bulgur wheat, or millet.  Start the day with a breakfast that is high in fiber, such as a cereal that contains at least 5 grams of fiber per serving.  Use beans in place of meat in soups, salads, or pasta.  Eat high-fiber snacks, such as berries, raw vegetables, nuts, or popcorn. This information is not intended to replace advice given to you by your health care provider. Make sure  you discuss any questions you have with your health care provider. Document Released: 11/01/2005 Document Revised: 04/08/2016 Document Reviewed: 04/16/2014 Elsevier Interactive Patient Education   2018 [place].   Gastroesophageal Reflux Disease, Adult Normally, food travels down the esophagus and stays in the stomach to be digested. If a person has gastroesophageal reflux disease (GERD), food and stomach acid move back up into the esophagus. When this happens, the esophagus becomes sore and swollen (inflamed). Over time, GERD can make small holes (ulcers) in the lining of the esophagus. Follow these instructions at home: Diet  Follow a diet as told by your doctor. You may need to avoid foods and drinks such as: ? Coffee and tea (with or without caffeine). ? Drinks that contain alcohol. ? Energy drinks and sports drinks. ? Carbonated drinks or sodas. ? Chocolate and cocoa. ? Peppermint and mint flavorings. ? Garlic and onions. ? Horseradish. ? Spicy and acidic foods, such as peppers, chili powder, curry powder, vinegar, hot sauces, and BBQ sauce. ? Citrus fruit juices and citrus fruits, such as oranges, lemons, and limes. ? Tomato-based foods, such as red sauce, chili, salsa, and pizza with red sauce. ? Fried and fatty foods, such as donuts, french fries, potato chips, and high-fat dressings. ? High-fat meats, such as hot dogs, rib eye steak, sausage, ham, and bacon. ? High-fat dairy items, such as whole milk, butter, and cream cheese.  Eat small meals often. Avoid eating large meals.  Avoid drinking large amounts of liquid with your meals.  Avoid eating meals during the 2-3 hours before bedtime.  Avoid lying down right after you eat.  Do not exercise right after you eat. General instructions  Pay attention to any changes in your symptoms.  Take over-the-counter and prescription medicines only as told by your doctor. Do not take aspirin, ibuprofen, or other NSAIDs unless your doctor says it is okay.  Do not use any tobacco products, including cigarettes, chewing tobacco, and e-cigarettes. If you need help quitting, ask your doctor.  Wear loose clothes. Do not wear  anything tight around your waist.  Raise (elevate) the head of your bed about 6 inches (15 cm).  Try to lower your stress. If you need help doing this, ask your doctor.  If you are overweight, lose an amount of weight that is healthy for you. Ask your doctor about a safe weight loss goal.  Keep all follow-up visits as told by your doctor. This is important. Contact a doctor if:  You have new symptoms.  You lose weight and you do not know why it is happening.  You have trouble swallowing, or it hurts to swallow.  You have wheezing or a cough that keeps happening.  Your symptoms do not get better with treatment.  You have a hoarse voice. Get help right away if:  You have pain in your arms, neck, jaw, teeth, or back.  You feel sweaty, dizzy, or light-headed.  You have chest pain or shortness of breath.  You throw up (vomit) and your throw up looks like blood or coffee grounds.  You pass out (faint).  Your poop (stool) is bloody or black.  You cannot swallow, drink, or eat. This information is not intended to replace advice given to you by your health care provider. Make sure you discuss any questions you have with your health care provider. Document Released: 04/19/2008 Document Revised: 04/08/2016 Document Reviewed: 02/26/2015 Elsevier Interactive Patient Education  2018 [employer]. months

## 2017-12-19 NOTE — Progress Notes (Addendum)
[facility] [place]  [place], [place] [address]  Main: [phone]  Fax: [phone]   Gastroenterology Consultation  Referring Provider:     Arnetha Courser, MD Primary Care Physician:  Arnetha Courser, MD Primary Gastroenterologist:  Dr. [facility] Reason for Consultation:    Bright red blood per rectum        HPI:   Yolanda Pace is a 52 y.o. y/o female referred for consultation & management  by Dr. Sanda Klein, Satira Anis, MD.  Patient reports 5-year history of intermittent bright red blood per rectum, occurs about 2-4 times a month, sees blood streaks in stools at times, and sometimes only blood on toilet paper.  States she feels hemorrhoids when she wipes.  No previous endoscopies.  No abdominal pain.  Reports bowel movements have always altered between constipation and diarrhea.  Will have 2 days of hard bowel movements and then loose stools for 2 days after that.  Does not use anything for her bowel movements.  No weight loss.  No family history of colon cancer.  Reports history of heartburn, takes omeprazole every morning and states as long as she takes that her heartburn remains well controlled and she does not have any symptoms during the day.  States heartburn started 30 years ago, and she has been on omeprazole daily for 15 years.  If she does not have the medication she has uncontrolled heartburn.  No dysphagia.  No nausea vomiting.  Takes ibuprofen daily.  Reports 2-3 years ago she had shoulder injection, and when she had seizures.  Past Medical History:  Diagnosis Date  . Allergic rhinitis   . Anxiety   . Asthma   . Cystic thyroid nodule March 2015   70mm on u/s  . Depression   . Insomnia   . Obesity   . Right thyroid nodule 05/26/2017   Solid 1 cm RIGHT lobe July 2018  . Seizures [facility]) June 2015   provoked after cervical nerve block  . Skin cancer Aug 2015   removed from back  . Vitamin D deficiency disease     Past Surgical  History:  Procedure Laterality Date  . ABDOMINAL HYSTERECTOMY  2005ish   fibroids, ovaries remain; along with bladder tack  . bladder tack  2005ish  . CHOLECYSTECTOMY    . SKIN CANCER EXCISION  Aug 2015   removed from back    Prior to Admission medications   Medication Sig Start Date End Date Taking? Authorizing Provider  albuterol (PROAIR HFA) 108 (90 Base) MCG/ACT inhaler Inhale 2 puffs into the lungs every 4 (four) hours as needed for wheezing or shortness of breath. 10/18/17  Yes Lada, Satira Anis, MD  albuterol (PROVENTIL) (2.5 MG/3ML) 0.083% nebulizer solution Take 3 mLs (2.5 mg total) by nebulization every 4 (four) hours as needed for wheezing or shortness of breath. 10/18/17  Yes Lada, Satira Anis, MD  cholecalciferol (VITAMIN D) 1000 units tablet Take 1,000 Units by mouth daily.   Yes [provider]  levocetirizine (XYZAL) 5 MG tablet Take 1 tablet (5 mg total) by mouth every evening. 11/09/17  Yes Lada, Satira Anis, MD  mometasone (NASONEX) 50 MCG/ACT nasal spray Place 2 sprays into the nose daily. 10/18/17  Yes Lada, Satira Anis, MD  montelukast (SINGULAIR) 10 MG tablet Take 1 tablet (10 mg total) by mouth at bedtime. 11/09/17  Yes Lada, Satira Anis, MD  omeprazole (PRILOSEC) 20 MG capsule Take 20 mg by mouth daily.  Yes [provider]    Family History  Problem Relation Age of Onset  . Hyperlipidemia Mother   . Diabetes Sister   . Hyperlipidemia Sister   . Hypertension Sister   . Hypertension Brother   . Pneumonia Maternal Grandmother   . Heart attack Maternal Grandfather   . Diabetes Sister   . Hypertension Sister   . Epilepsy Sister   . Hypertension Sister   . Hypertension Daughter   . Kidney Stones Daughter   . Cancer Neg Hx   . COPD Neg Hx   . Heart disease Neg Hx   . Stroke Neg Hx      Social History   Tobacco Use  . Smoking status: Never Smoker  . Smokeless tobacco: Never Used  Substance Use Topics  . Alcohol use: No  . Drug use: No     Allergies as of 12/19/2017 - Review Complete 12/19/2017  Allergen Reaction Noted  . Ioxaglate Swelling 10/17/2015  . Codeine Nausea And Vomiting 09/29/2015  . Compazine  [prochlorperazine] Other (See Comments) 09/29/2015  . Contrast media [iodinated diagnostic agents] Swelling 04/15/2015  . Doxycycline Nausea Only 09/29/2015  . Iodine Swelling 09/29/2015  . Prochlorperazine edisylate Other (See Comments) 10/17/2015  . Sulfur Other (See Comments) 09/29/2015    Review of Systems:    All systems reviewed and negative except where noted in HPI.   Physical Exam:  BP (!) 151/91   Pulse 79   Ht 5\' 3"  (1.6 m)   Wt 189 lb 12.8 oz (86.1 kg)   BMI 33.62 kg/m  No LMP recorded. Patient has had a hysterectomy. Psych:  Alert and cooperative. Normal mood and affect. General:   Alert,  Well-developed, well-nourished, pleasant and cooperative in NAD Head:  Normocephalic and atraumatic. Eyes:  Sclera clear, no icterus.   Conjunctiva pink. Ears:  Normal auditory acuity. Nose:  No deformity, discharge, or lesions. Mouth:  No deformity or lesions,oropharynx pink & moist. Neck:  Supple; no masses or thyromegaly. Lungs:  Respirations even and unlabored.  Clear throughout to auscultation.   No wheezes, crackles, or rhonchi. No acute distress. Heart:  Regular rate and rhythm; no murmurs, clicks, rubs, or gallops. Abdomen:  Normal bowel sounds.  No bruits.  Soft, non-tender and non-distended without masses, hepatosplenomegaly or hernias noted.  No guarding or rebound tenderness.    Msk:  Symmetrical without gross deformities. Good, equal movement & strength bilaterally. Pulses:  Normal pulses noted. Extremities:  No clubbing or edema.  No cyanosis. Neurologic:  Alert and oriented x3;  grossly normal neurologically. Skin:  Intact without significant lesions or rashes. No jaundice. Lymph Nodes:  No significant cervical adenopathy. Psych:  Alert and cooperative. Normal mood and  affect.   Labs: CBC    Component Value Date/Time   WBC 6.9 11/09/2017 0912   RBC 4.72 11/09/2017 0912   HGB 13.6 11/09/2017 0912   HGB 13.2 12/30/2015 1632   HCT 40.1 11/09/2017 0912   HCT 39.9 12/30/2015 1632   PLT 320 11/09/2017 0912   PLT 285 12/30/2015 1632   MCV 85.0 11/09/2017 0912   MCV 90 12/30/2015 1632   MCH 28.8 11/09/2017 0912   MCHC 33.9 11/09/2017 0912   RDW 12.1 11/09/2017 0912   RDW 13.0 12/30/2015 1632   LYMPHSABS 2,663 11/09/2017 0912   LYMPHSABS 3.4 (H) 12/30/2015 1632   EOSABS 283 11/09/2017 0912   EOSABS 0.3 12/30/2015 1632   BASOSABS 90 11/09/2017 0912   BASOSABS 0.1 12/30/2015 1632   CMP  Component Value Date/Time   NA 140 11/09/2017 0912   NA 142 12/30/2015 1632   K 4.4 11/09/2017 0912   CL 106 11/09/2017 0912   CO2 26 11/09/2017 0912   GLUCOSE 105 (H) 11/09/2017 0912   BUN 15 11/09/2017 0912   BUN 14 12/30/2015 1632   CREATININE 0.55 11/09/2017 0912   CALCIUM 9.5 11/09/2017 0912   PROT 6.4 11/09/2017 0912   PROT 7.0 07/11/2017 1628   ALBUMIN 4.2 05/25/2017 1222   ALBUMIN 4.4 12/30/2015 1632   AST 16 11/09/2017 0912   ALT 30 (H) 11/09/2017 0912   ALKPHOS 80 05/25/2017 1222   BILITOT 0.4 11/09/2017 0912   BILITOT 0.2 12/30/2015 1632   GFRNONAA 109 11/09/2017 0912   GFRAA 126 11/09/2017 0912    Imaging Studies: No results found.  Assessment and Plan:   Yolanda Pace is a 52 y.o. y/o female has been referred for intermittent bright red blood per rectum for 5 years, with no anemia on blood work  Symptoms likely related to underlying hemorrhoids Does not report any pain from her hemorrhoids Will start on high-fiber diet, handout given for the same  Patient has not had a screening exam for colorectal cancer in the past, and thus one is indicated at this time. Will have endoscopy unit staff, contact anesthesia to review her previous history of seizure with her shoulder injection.  Would like to see if they think it is safe to proceed  with endoscopic procedures with sedation.  If anesthesia is willing to proceed, a colonoscopy would allow for both diagnostic and therapeutic exam.  This was discussed with the patient in detail.  Other screening exams including stool testing and CT colonoscopy would only allow for diagnostic exam, and if they are positive, she would then require a colonoscopy anyway.  She would like to proceed with a colonoscopy if anesthesia thinks it is safe. (I have discussed alternative options, risks & benefits,  which include, but are not limited to, bleeding, infection, perforation,respiratory complication & drug reaction.  The patient agrees with this plan & written consent will be obtained. )  If anesthesia has not willing to proceed, then alternative test will be needed, and a CT colonoscopy can be considered however, patient understands that if this is positive she will need a colonoscopy after that anyway.  An EGD can also be done due to her heartburn, and daily NSAID use to evaluate for any Barrett's and gastric ulcers.  This is only if anesthesia is agreeable to proceeding.  Patient is agreeable with this plan.  Acid reflux lifestyle modifications were also discussed and handout given for the same.  Patient was asked to take NSAIDs sparingly, take only with food, and stop taking it if she notes any melena or hematochezia or hematemesis and come to the ER if this occurs.  The omeprazole that she is on daily is also protective against ulcers.  Dr [facility]  Previous records obtained, and are scanned under media and patient's chart.  It appears that in June 2015 patient had local anesthetic toxicity reaction at the time of her left stellate ganglion block.  Therefore, this would be unrelated to the sedation given during endoscopy.  We will thus proceed with scheduling for procedures.  We will make anesthesia aware about reviewing these notes as well on the patient's chart.

## 2017-12-26 ENCOUNTER — Ambulatory Visit: Payer: BC Managed Care – PPO | Admitting: Family Medicine

## 2017-12-26 ENCOUNTER — Encounter: Payer: Self-pay | Admitting: Family Medicine

## 2017-12-26 VITALS — BP 144/82 | HR 85 | Temp 98.4°F | Resp 14 | Wt 188.2 lb

## 2017-12-26 DIAGNOSIS — R7982 Elevated C-reactive protein (CRP): Secondary | ICD-10-CM

## 2017-12-26 DIAGNOSIS — R102 Pelvic and perineal pain: Secondary | ICD-10-CM

## 2017-12-26 DIAGNOSIS — R682 Dry mouth, unspecified: Secondary | ICD-10-CM

## 2017-12-26 DIAGNOSIS — F331 Major depressive disorder, recurrent, moderate: Secondary | ICD-10-CM | POA: Diagnosis not present

## 2017-12-26 DIAGNOSIS — F325 Major depressive disorder, single episode, in full remission: Secondary | ICD-10-CM | POA: Insufficient documentation

## 2017-12-26 DIAGNOSIS — J4520 Mild intermittent asthma, uncomplicated: Secondary | ICD-10-CM | POA: Diagnosis not present

## 2017-12-26 DIAGNOSIS — E041 Nontoxic single thyroid nodule: Secondary | ICD-10-CM | POA: Diagnosis not present

## 2017-12-26 MED ORDER — SERTRALINE HCL 100 MG PO TABS
100.0000 mg | ORAL_TABLET | Freq: Every day | ORAL | 3 refills | Status: DC
Start: 2017-12-26 — End: 2018-01-31

## 2017-12-26 NOTE — Assessment & Plan Note (Signed)
Patient denies SI/HI; does not sound like true manic or hypomanic behavior; encouraged her to contact me or seek help if any mania or hypomania develops on higher dose of SSRI; she agrees; will have her take 50 mg of sertraline for another 6-8 days, then increase to 100 mg daily; close f/u with me in 3 weeks, but call sooner if feeling worse; she agrees; I also gave her a list of [place] counselors and she'll call to schedule an appt

## 2017-12-26 NOTE — Assessment & Plan Note (Signed)
Patient to contact rheumatologist to ask about any further testing; I believe her specialist should be the one to monitor and work-up any additional rheumatologic conditions rather than me draw labs that she found in her research; patient understands and will contact her rheumatologist

## 2017-12-26 NOTE — Progress Notes (Signed)
BP (!) 144/82   Pulse 85   Temp 98.4 F (36.9 C) (Oral)   Resp 14   Wt 188 lb 3.2 oz (85.4 kg)   SpO2 94%   BMI 33.34 kg/m    Subjective:    Patient ID: Yolanda Pace, female    DOB: 1966-01-13, 52 y.o.   MRN: [id number]  HPI: Yolanda Pace is a 52 y.o. female  Chief Complaint  Patient presents with  . Depression    was weaning off zoloft and found out she cant stop, so needs to stay on or get new med  . Bladder Prolapse    has mesh implant but is still having alot of pain and needs to get referral to get removed    HPI Patient is here Depression; she had tried to go off of it; weaned down and was not even on a little piece, like 6 mg; went completely off for 3 weeks, then her pains came back; she lost weight, but gained it back; she was not eating, going all the time; cleaned out closets; doesn't think she was manage, but had lots of energy; then started to hurt again and then the depression came back; she started back on 25 mg zoloft and now up to 50 mg; no recent dark thoughts or SI/HI; knows the feeling and when she is going down; she has been on the 50 mg of sertraline for a few days; feels better; no nausea except briefly this weekend; she wouldn't mind counseling  She had the mesh implant, 15 years ago; that's what put her on the zoloft in the first place; she was in a deep severe depression after the implant; she wants to have the mesh removed; has had it for 15 years now and likely to be in there; needs referral and checking into her options; may be out of state; she thinks the thyroid nodule is from the mesh; she 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; 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; has vit D deficiency, irritable bowel, depression, allergies and asthma; every time she gets a cold she gets bronchitis; prior to the mesh, never even needed cold medicine; chronic pain, managed by rheumatologist; almost positive that the nerve block injection reaction happened, had  a seizure when they did the block; so allergic to everything; was not like that before the mesh; also has brain fog  No flu shot; does not want one; I can't change her mind she said  Redness of the blood vessels in the eyes; wears contacts; dry mouth and dry eyes; patient has a list of labs she looked up, and suggested checking IL-2 receptor antibodies, ANA, CRP; she'll see rheumatologist and get orders for labs  Depression screen [facility] 2/9 12/26/2017 11/09/2017 10/18/2017 07/11/2017 05/06/2017  Decreased Interest 1 0 0 0 0  Down, Depressed, Hopeless 1 0 0 0 0  PHQ - 2 Score 2 0 0 0 0  Altered sleeping 2 - - - -  Tired, decreased energy 2 - - - -  Change in appetite 1 - - - -  Feeling bad or failure about yourself  2 - - - -  Trouble concentrating 3 - - - -  Moving slowly or fidgety/restless 2 - - - -  Suicidal thoughts 0 - - - -  PHQ-9 Score 14 - - - -  Difficult doing work/chores Very difficult - - - -    Relevant past medical, surgical, family and  social history reviewed Past Medical History:  Diagnosis Date  . Allergic rhinitis   . Anxiety   . Asthma   . Cystic thyroid nodule March 2015   24mm on u/s  . Depression   . Insomnia   . Obesity   . Right thyroid nodule 05/26/2017   Solid 1 cm RIGHT lobe July 2018  . Seizures [facility]) June 2015   provoked after cervical nerve block  . Skin cancer Aug 2015   removed from back  . Vitamin D deficiency disease    Past Surgical History:  Procedure Laterality Date  . ABDOMINAL HYSTERECTOMY  2005ish   fibroids, ovaries remain; along with bladder tack  . bladder tack  2005ish  . CHOLECYSTECTOMY    . SKIN CANCER EXCISION  Aug 2015   removed from back   Family History  Problem Relation Age of Onset  . Hyperlipidemia Mother   . Diabetes Sister   . Hyperlipidemia Sister   . Hypertension Sister   . Hypertension Brother   . Pneumonia Maternal Grandmother   . Heart attack Maternal Grandfather   . Diabetes Sister   . Hypertension Sister    . Epilepsy Sister   . Hypertension Sister   . Hypertension Daughter   . Kidney Stones Daughter   . Cancer Neg Hx   . COPD Neg Hx   . Heart disease Neg Hx   . Stroke Neg Hx    Social History   Tobacco Use  . Smoking status: Never Smoker  . Smokeless tobacco: Never Used  Substance Use Topics  . Alcohol use: No  . Drug use: No    Interim medical history since last visit reviewed. Allergies and medications reviewed  Review of Systems Per HPI unless specifically indicated above     Objective:    BP (!) 144/82   Pulse 85   Temp 98.4 F (36.9 C) (Oral)   Resp 14   Wt 188 lb 3.2 oz (85.4 kg)   SpO2 94%   BMI 33.34 kg/m   Wt Readings from Last 3 Encounters:  12/26/17 188 lb 3.2 oz (85.4 kg)  12/19/17 189 lb 12.8 oz (86.1 kg)  11/09/17 189 lb 8 oz (86 kg)    Physical Exam  Constitutional: She appears well-developed and well-nourished. No distress.  Eyes: EOM are normal. No scleral icterus.  Wearing contacts; prominent feeder vessels OS leading toward her contact; very mild palpebral hyperemia; no discharge  Neck: No thyromegaly present.  Cardiovascular: Normal rate.  Pulmonary/Chest: Effort normal.  Abdominal: She exhibits no distension.  Skin: No pallor.  Psychiatric: She has a normal mood and affect. Her behavior is normal. Judgment and thought content normal.    Results for orders placed or performed in visit on 11/09/17  COMPLETE METABOLIC PANEL WITH GFR  Result Value Ref Range   Glucose, Bld 105 (H) 65 - 99 mg/dL   BUN 15 7 - 25 mg/dL   Creat 0.55 0.50 - 1.05 mg/dL   GFR, Est Non African American 109 > OR = 60 mL/min/1.38m2   GFR, Est African American 126 > OR = 60 mL/min/1.35m2   BUN/Creatinine Ratio NOT APPLICABLE 6 - 22 (calc)   Sodium 140 135 - 146 mmol/L   Potassium 4.4 3.5 - 5.3 mmol/L   Chloride 106 98 - 110 mmol/L   CO2 26 20 - 32 mmol/L   Calcium 9.5 8.6 - 10.4 mg/dL   Total Protein 6.4 6.1 - 8.1 g/dL   Albumin 4.2 3.6 -  5.1 g/dL   Globulin  2.2 1.9 - 3.7 g/dL (calc)   AG Ratio 1.9 1.0 - 2.5 (calc)   Total Bilirubin 0.4 0.2 - 1.2 mg/dL   Alkaline phosphatase (APISO) 72 33 - 130 U/L   AST 16 10 - 35 U/L   ALT 30 (H) 6 - 29 U/L  CBC with Differential/Platelet  Result Value Ref Range   WBC 6.9 3.8 - 10.8 Thousand/uL   RBC 4.72 3.80 - 5.10 Million/uL   Hemoglobin 13.6 11.7 - 15.5 g/dL   HCT 40.1 35.0 - 45.0 %   MCV 85.0 80.0 - 100.0 fL   MCH 28.8 27.0 - 33.0 pg   MCHC 33.9 32.0 - 36.0 g/dL   RDW 12.1 11.0 - 15.0 %   Platelets 320 140 - 400 Thousand/uL   MPV 9.7 7.5 - 12.5 fL   Neutro Abs 3,443 1,500 - 7,800 cells/uL   Lymphs Abs 2,663 850 - 3,900 cells/uL   WBC mixed population 421 200 - 950 cells/uL   Eosinophils Absolute 283 15 - 500 cells/uL   Basophils Absolute 90 0 - 200 cells/uL   Neutrophils Relative % 49.9 %   Total Lymphocyte 38.6 %   Monocytes Relative 6.1 %   Eosinophils Relative 4.1 %   Basophils Relative 1.3 %  Lipid panel  Result Value Ref Range   Cholesterol 215 (H) <200 mg/dL   HDL 47 (L) >50 mg/dL   Triglycerides 132 <150 mg/dL   LDL Cholesterol (Calc) 142 (H) mg/dL (calc)   Total CHOL/HDL Ratio 4.6 <5.0 (calc)   Non-HDL Cholesterol (Calc) 168 (H) <130 mg/dL (calc)  VITAMIN D 25 Hydroxy (Vit-D Deficiency, Fractures)  Result Value Ref Range   Vit D, 25-Hydroxy 27 (L) 30 - 100 ng/mL  TSH  Result Value Ref Range   TSH 1.23 mIU/L      Assessment & Plan:   Problem List Items Addressed This Visit      Respiratory   Asthma    Managed here; she does not want flu vaccine and does not want me to discuss reasons to take it; avoid high exposure areas        Endocrine   Cystic thyroid nodule    Managed by ENT        Other   Pelvic pain    Since the mesh placed and progressive; she will call us with name/location of preferred GYN for surgical consultation      Elevated C-reactive protein (CRP)    Patient to contact rheumatologist to ask about any further testing; I believe her specialist  should be the one to monitor and work-up any additional rheumatologic conditions rather than me draw labs that she found in her research; patient understands and will contact her rheumatologist      Depression, major, recurrent, moderate ([place]) - Primary    Patient denies SI/HI; does not sound like true manic or hypomanic behavior; encouraged her to contact me or seek help if any mania or hypomania develops on higher dose of SSRI; she agrees; will have her take 50 mg of sertraline for another 6-8 days, then increase to 100 mg daily; close f/u with me in 3 weeks, but call sooner if feeling worse; she agrees; I also gave her a list of [facility] counsel[facility] and she'll call to schedule an appt      Relevant Medications   sertraline (ZOLOFT) 100 MG tablet    Other Visit Diagnoses    Dry mouth  patient to check with her rheum to see if checking labs for Sjogren's is appropriate       Follow up plan: Return in about 3 weeks (around 01/16/2018) for follow-up visit with Dr. Sanda Klein.  An after-visit summary was printed and given to the patient at [facility].  Please see the patient instructions which may contain other information and recommendations beyond what is mentioned above in the assessment and plan.  Meds ordered this encounter  Medications  . sertraline (ZOLOFT) 100 MG tablet    Sig: Take 1 tablet (100 mg total) by mouth daily.    Dispense:  30 tablet    Refill:  3    No orders of the defined types were placed in this encounter.

## 2017-12-26 NOTE — Patient Instructions (Addendum)
I do recommend yearly flu shots; for individuals who don't want flu shots, try to practice excellent hand hygiene, and avoid nursing homes, day cares, and hospitals during peak flu season; taking additional vitamin C daily during flu/cold season may help boost your immune system too There is an open invitation for you to let me know who/where you'd like to go for the mesh removal After about 4-6 more days, increase the sertraline to 100 mg daily If you have any mania or hypomania, please call me Please contact Dr. Annalee Genta at [facility] through their MyChart account

## 2017-12-26 NOTE — Assessment & Plan Note (Signed)
Since the mesh placed and progressive; she will call us with name/location of preferred GYN for surgical consultation

## 2017-12-26 NOTE — Assessment & Plan Note (Signed)
Managed here; she does not want flu vaccine and does not want me to discuss reasons to take it; avoid high exposure areas

## 2017-12-26 NOTE — Assessment & Plan Note (Signed)
Managed by ENT 

## 2017-12-31 ENCOUNTER — Encounter: Payer: Self-pay | Admitting: Family Medicine

## 2017-12-31 DIAGNOSIS — R102 Pelvic and perineal pain: Secondary | ICD-10-CM

## 2018-01-16 ENCOUNTER — Encounter: Payer: Self-pay | Admitting: Family Medicine

## 2018-01-16 ENCOUNTER — Ambulatory Visit: Payer: BC Managed Care – PPO | Admitting: Family Medicine

## 2018-01-16 DIAGNOSIS — E7800 Pure hypercholesterolemia, unspecified: Secondary | ICD-10-CM | POA: Diagnosis not present

## 2018-01-16 DIAGNOSIS — M2550 Pain in unspecified joint: Secondary | ICD-10-CM | POA: Diagnosis not present

## 2018-01-16 DIAGNOSIS — I10 Essential (primary) hypertension: Secondary | ICD-10-CM | POA: Diagnosis not present

## 2018-01-16 DIAGNOSIS — F331 Major depressive disorder, recurrent, moderate: Secondary | ICD-10-CM | POA: Diagnosis not present

## 2018-01-16 DIAGNOSIS — E559 Vitamin D deficiency, unspecified: Secondary | ICD-10-CM | POA: Diagnosis not present

## 2018-01-16 MED ORDER — TRAMADOL HCL 50 MG PO TABS: 50.0000 mg | ORAL_TABLET | Freq: Four times a day (QID) | ORAL | 0 refills | Status: DC | PRN

## 2018-01-16 NOTE — Patient Instructions (Addendum)
Return on another day for fasting labs Please do start working with a counselor / therapist which I think will really help Try to follow the DASH guidelines (DASH stands for Dietary Approaches to Stop Hypertension). Try to limit the sodium in your diet to no more than 1,500mg  of sodium per day. Certainly try to not exceed 2,000 mg per day at the very most. Do not add salt when cooking or at the table.  Check the sodium amount on labels when shopping, and choose items lower in sodium when given a choice. Avoid or limit foods that already contain a lot of sodium. Eat a diet rich in fruits and vegetables and whole grains, and try to lose weight if overweight or obese    Steps to Elicit the Relaxation Response The following is the technique reprinted with permission from Dr. Billie Ruddy book The Relaxation Response pages 162-163 1. Sit quietly in a comfortable position. 2. Close your eyes. 3. Deeply relax all your muscles,  beginning at your feet and progressing up to your face.  Keep them relaxed. 4. Breathe through your nose.  Become aware of your breathing.  As you breathe out, say the word, "one"*,  silently to yourself. For example,  breathe in ... out, "one",- in .. out, "one", etc.  Breathe easily and naturally. 5. Continue for 10 to 20 minutes.  You may open your eyes to check the time, but do not use an alarm.  When you finish, sit quietly for several minutes,  at first with your eyes closed and later with your eyes opened.  Do not stand up for a few minutes. 6. Do not worry about whether you are successful  in achieving a deep level of relaxation.  Maintain a passive attitude and permit relaxation to occur at its own pace.  When distracting thoughts occur,  try to ignore them by not dwelling upon them  and return to repeating "one."  With practice, the response should come with little effort.  Practice the technique once or twice daily,  but not within two hours after any  meal,  since the digestive processes seem to interfere with  the elicitation of the Relaxation Response. * It is better to use a soothing, mellifluous sound, preferably with no meaning. or association, to avoid stimulation of unnecessary thoughts - a mantra.   DASH Eating Plan DASH stands for "Dietary Approaches to Stop Hypertension." The DASH eating plan is a healthy eating plan that has been shown to reduce high blood pressure (hypertension). It may also reduce your risk for type 2 diabetes, heart disease, and stroke. The DASH eating plan may also help with weight loss. What are tips for following this plan? General guidelines  Avoid eating more than 2,300 mg (milligrams) of salt (sodium) a day. If you have hypertension, you may need to reduce your sodium intake to 1,500 mg a day.  Limit alcohol intake to no more than 1 drink a day for nonpregnant women and 2 drinks a day for men. One drink equals 12 oz of beer, 5 oz of wine, or 1 oz of hard liquor.  Work with your health care provider to maintain a healthy body weight or to lose weight. Ask what an ideal weight is for you.  Get at least 30 minutes of exercise that causes your heart to beat faster (aerobic exercise) most days of the week. Activities may include walking, swimming, or biking.  Work with your health care provider or diet and  nutrition specialist (dietitian) to adjust your eating plan to your individual calorie needs. Reading food labels  Check food labels for the amount of sodium per serving. Choose foods with less than 5 percent of the Daily Value of sodium. Generally, foods with less than 300 mg of sodium per serving fit into this eating plan.  To find whole grains, look for the word "whole" as the first word in the ingredient list. Shopping  Buy products labeled as "low-sodium" or "no salt added."  Buy fresh foods. Avoid canned foods and premade or frozen meals. Cooking  Avoid adding salt when cooking. Use  salt-free seasonings or herbs instead of table salt or sea salt. Check with your health care provider or pharmacist before using salt substitutes.  Do not fry foods. Cook foods using healthy methods such as baking, boiling, grilling, and broiling instead.  Cook with heart-healthy oils, such as olive, canola, soybean, or sunflower oil. Meal planning   Eat a balanced diet that includes: ? 5 or more servings of fruits and vegetables each day. At each meal, try to fill half of your plate with fruits and vegetables. ? Up to 6-8 servings of whole grains each day. ? Less than 6 oz of lean meat, poultry, or fish each day. A 3-oz serving of meat is about the same size as a deck of cards. One egg equals 1 oz. ? 2 servings of low-fat dairy each day. ? A serving of nuts, seeds, or beans 5 times each week. ? Heart-healthy fats. Healthy fats called Omega-3 fatty acids are found in foods such as flaxseeds and coldwater fish, like sardines, salmon, and mackerel.  Limit how much you eat of the following: ? Canned or prepackaged foods. ? Food that is high in trans fat, such as fried foods. ? Food that is high in saturated fat, such as fatty meat. ? Sweets, desserts, sugary drinks, and other foods with added sugar. ? Full-fat dairy products.  Do not salt foods before eating.  Try to eat at least 2 vegetarian meals each week.  Eat more home-cooked food and less restaurant, buffet, and fast food.  When eating at a restaurant, ask that your food be prepared with less salt or no salt, if possible. What foods are recommended? The items listed may not be a complete list. Talk with your dietitian about what dietary choices are best for you. Grains Whole-grain or whole-wheat bread. Whole-grain or whole-wheat pasta. Brown rice. [place]. Bulgur. Whole-grain and low-sodium cereals. Pita bread. Low-fat, low-sodium crackers. Whole-wheat flour tortillas. Vegetables Fresh or frozen vegetables (raw, steamed,  roasted, or grilled). Low-sodium or reduced-sodium tomato and vegetable juice. Low-sodium or reduced-sodium tomato sauce and tomato paste. Low-sodium or reduced-sodium canned vegetables. Fruits All fresh, dried, or frozen fruit. Canned fruit in natural juice (without added sugar). Meat and other protein foods Skinless chicken or [place]. Ground chicken or [place]. Pork with fat trimmed off. Fish and seafood. Egg whites. Dried beans, peas, or lentils. Unsalted nuts, nut butters, and seeds. Unsalted canned beans. Lean cuts of beef with fat trimmed off. Low-sodium, lean deli meat. Dairy Low-fat (1%) or fat-free (skim) milk. Fat-free, low-fat, or reduced-fat cheeses. Nonfat, low-sodium ricotta or cottage cheese. Low-fat or nonfat yogurt. Low-fat, low-sodium cheese. Fats and oils Soft margarine without trans fats. Vegetable oil. Low-fat, reduced-fat, or light mayonnaise and salad dressings (reduced-sodium). Canola, safflower, olive, soybean, and sunflower oils. Avocado. Seasoning and other foods Herbs. Spices. Seasoning mixes without salt. Unsalted popcorn and pretzels. Fat-free sweets. What  foods are not recommended? The items listed may not be a complete list. Talk with your dietitian about what dietary choices are best for you. Grains Baked goods made with fat, such as croissants, muffins, or some breads. Dry pasta or rice meal packs. Vegetables Creamed or fried vegetables. Vegetables in a cheese sauce. Regular canned vegetables (not low-sodium or reduced-sodium). Regular canned tomato sauce and paste (not low-sodium or reduced-sodium). Regular tomato and vegetable juice (not low-sodium or reduced-sodium). Angie Fava. Olives. Fruits Canned fruit in a light or heavy syrup. Fried fruit. Fruit in cream or butter sauce. Meat and other protein foods Fatty cuts of meat. Ribs. Fried meat. Berniece Salines. Sausage. Bologna and other processed lunch meats. Salami. Fatback. Hotdogs. Bratwurst. Salted nuts and seeds. Canned  beans with added salt. Canned or smoked fish. Whole eggs or egg yolks. Chicken or [place] with skin. Dairy Whole or 2% milk, cream, and half-and-half. Whole or full-fat cream cheese. Whole-fat or sweetened yogurt. Full-fat cheese. Nondairy creamers. Whipped toppings. Processed cheese and cheese spreads. Fats and oils Butter. Stick margarine. Lard. Shortening. Ghee. Bacon fat. Tropical oils, such as coconut, palm kernel, or palm oil. Seasoning and other foods Salted popcorn and pretzels. Onion salt, garlic salt, seasoned salt, table salt, and sea salt. Worcestershire sauce. Tartar sauce. Barbecue sauce. Teriyaki sauce. Soy sauce, including reduced-sodium. Steak sauce. Canned and packaged gravies. Fish sauce. Oyster sauce. Cocktail sauce. Horseradish that you find on the shelf. Ketchup. Mustard. Meat flavorings and tenderizers. Bouillon cubes. Hot sauce and Tabasco sauce. Premade or packaged marinades. Premade or packaged taco seasonings. Relishes. Regular salad dressings. Where to find more information:  National Heart, Lung, and [place]: https://wilson-eaton.com/  American Heart Association: www.heart.org Summary  The DASH eating plan is a healthy eating plan that has been shown to reduce high blood pressure (hypertension). It may also reduce your risk for type 2 diabetes, heart disease, and stroke.  With the DASH eating plan, you should limit salt (sodium) intake to 2,300 mg a day. If you have hypertension, you may need to reduce your sodium intake to 1,500 mg a day.  When on the DASH eating plan, aim to eat more fresh fruits and vegetables, whole grains, lean proteins, low-fat dairy, and heart-healthy fats.  Work with your health care provider or diet and nutrition specialist (dietitian) to adjust your eating plan to your individual calorie needs. This information is not intended to replace advice given to you by your health care provider. Make sure you discuss any questions you have with your  health care provider. Document Released: 10/21/2011 Document Revised: 10/25/2016 Document Reviewed: 10/25/2016 Elsevier Interactive Patient Education  [employer].

## 2018-01-16 NOTE — Progress Notes (Signed)
BP (!) 142/86 (BP Location: Left Arm, Cuff Size: Large)   Pulse 74   Temp 97.8 F (36.6 C) (Oral)   Resp 16   Ht 5\' 3"  (1.6 m)   Wt 185 lb 3.2 oz (84 kg)   SpO2 96%   BMI 32.81 kg/m    Subjective:    Patient ID: Yolanda Pace, female    DOB: Jan 12, 1966, 52 y.o.   MRN: [id number]  HPI: Yolanda Pace is a 52 y.o. female  Chief Complaint  Patient presents with  . Follow-up    3 weeks  . Depression    HPI Here for f/u Depression Had tried to come off of the SSRI, off for about 3 weeks Started back on zoloft mental fogginess; so exhausted; Saturday slept until 9 am, then up and then slept until 3 pm on the couch; had really bad arthritis that week; could barely walk that her arthritis was so bad Does feel down and has more anxiety Thinking about things more; knows she needs the mesh removed; scared about the results No SI/HI On zoloft for 14 years The rheumatologist has put her on plaquenil, but she cannot take it; most days can live with the ibuprofen alone She went to the GI doctor, and wants to do EGD and colonoscopy; had to put it on the back burner; discussed elevating HOB; has certain triggers, coffee The plaquenil is waking her up at night with acid in her throat; stopped the plaquenil Vitamin D deficiency, up to 5,000 iu daily, then will go back to 2,000 iu after a while She has an appt with specialist about having the mesh removed High cholesterol; will return for labs on another day Losing weight; eating more fruits and trying; weight loss intentional BP is high today; thinks it has to do with the arthritis and mesh stuff; she says she has never had problems with her BP and thinks it is the mesh She has allergies, but not having a lot of symptoms; not really bad She has the information about seeing a therapist / counselor, but just has not called  Depression screen [facility] 2/9 01/16/2018 12/26/2017 11/09/2017 10/18/2017 07/11/2017  Decreased Interest 1 1 0 0 0  Down,  Depressed, Hopeless 1 1 0 0 0  PHQ - 2 Score 2 2 0 0 0  Altered sleeping 1 2 - - -  Tired, decreased energy 1 2 - - -  Change in appetite 1 1 - - -  Feeling bad or failure about yourself  0 2 - - -  Trouble concentrating 3 3 - - -  Moving slowly or fidgety/restless 1 2 - - -  Suicidal thoughts 0 0 - - -  PHQ-9 Score 9 14 - - -  Difficult doing work/chores Somewhat difficult Very difficult - - -    Relevant past medical, surgical, family and social history reviewed Past Medical History:  Diagnosis Date  . Allergic rhinitis   . Anxiety   . Asthma   . Cystic thyroid nodule March 2015   50mm on u/s  . Depression   . Insomnia   . Obesity   . Right thyroid nodule 05/26/2017   Solid 1 cm RIGHT lobe July 2018  . Seizures [facility]) June 2015   provoked after cervical nerve block  . Skin cancer Aug 2015   removed from back  . Vitamin D deficiency disease    Past Surgical History:  Procedure Laterality Date  . ABDOMINAL HYSTERECTOMY  [id number]   fibroids, ovaries remain; along with bladder tack  . bladder tack  2005ish  . CHOLECYSTECTOMY    . SKIN CANCER EXCISION  Aug 2015   removed from back   Family History  Problem Relation Age of Onset  . Hyperlipidemia Mother   . Diabetes Sister   . Hyperlipidemia Sister   . Hypertension Sister   . Hypertension Brother   . Pneumonia Maternal Grandmother   . Heart attack Maternal Grandfather   . Diabetes Sister   . Hypertension Sister   . Epilepsy Sister   . Hypertension Sister   . Hypertension Daughter   . Kidney Stones Daughter   . Cancer Neg Hx   . COPD Neg Hx   . Heart disease Neg Hx   . Stroke Neg Hx    Social History   Tobacco Use  . Smoking status: Never Smoker  . Smokeless tobacco: Never Used  Substance Use Topics  . Alcohol use: No  . Drug use: No    Interim medical history since last visit reviewed. Allergies and medications reviewed  Review of Systems Per HPI unless specifically indicated above     Objective:      BP (!) 142/86 (BP Location: Left Arm, Cuff Size: Large)   Pulse 74   Temp 97.8 F (36.6 C) (Oral)   Resp 16   Ht 5\' 3"  (1.6 m)   Wt 185 lb 3.2 oz (84 kg)   SpO2 96%   BMI 32.81 kg/m   Wt Readings from Last 3 Encounters:  01/16/18 185 lb 3.2 oz (84 kg)  12/26/17 188 lb 3.2 oz (85.4 kg)  12/19/17 189 lb 12.8 oz (86.1 kg)    Physical Exam  Constitutional: She appears well-developed and well-nourished. No distress.  Eyes: EOM are normal. No scleral icterus.  Neck: No thyromegaly present.  Cardiovascular: Normal rate.  Pulmonary/Chest: Effort normal.  Abdominal: She exhibits no distension.  Skin: She is not diaphoretic. No pallor.  Psychiatric: She has a normal mood and affect. Her behavior is normal. Judgment and thought content normal.  Good eye contact with examiner    Results for orders placed or performed in visit on 11/09/17  COMPLETE METABOLIC PANEL WITH GFR  Result Value Ref Range   Glucose, Bld 105 (H) 65 - 99 mg/dL   BUN 15 7 - 25 mg/dL   Creat 0.55 0.50 - 1.05 mg/dL   GFR, Est Non African American 109 > OR = 60 mL/min/1.26m2   GFR, Est African American 126 > OR = 60 mL/min/1.38m2   BUN/Creatinine Ratio NOT APPLICABLE 6 - 22 (calc)   Sodium 140 135 - 146 mmol/L   Potassium 4.4 3.5 - 5.3 mmol/L   Chloride 106 98 - 110 mmol/L   CO2 26 20 - 32 mmol/L   Calcium 9.5 8.6 - 10.4 mg/dL   Total Protein 6.4 6.1 - 8.1 g/dL   Albumin 4.2 3.6 - 5.1 g/dL   Globulin 2.2 1.9 - 3.7 g/dL (calc)   AG Ratio 1.9 1.0 - 2.5 (calc)   Total Bilirubin 0.4 0.2 - 1.2 mg/dL   Alkaline phosphatase (APISO) 72 33 - 130 U/L   AST 16 10 - 35 U/L   ALT 30 (H) 6 - 29 U/L  CBC with Differential/Platelet  Result Value Ref Range   WBC 6.9 3.8 - 10.8 Thousand/uL   RBC 4.72 3.80 - 5.10 Million/uL   Hemoglobin 13.6 11.7 - 15.5 g/dL   HCT 40.1 35.0 - 45.0 %  MCV 85.0 80.0 - 100.0 fL   MCH 28.8 27.0 - 33.0 pg   MCHC 33.9 32.0 - 36.0 g/dL   RDW 12.1 11.0 - 15.0 %   Platelets 320 140 - 400  Thousand/uL   MPV 9.7 7.5 - 12.5 fL   Neutro Abs 3,443 1,500 - 7,800 cells/uL   Lymphs Abs 2,663 850 - 3,900 cells/uL   WBC mixed population 421 200 - 950 cells/uL   Eosinophils Absolute 283 15 - 500 cells/uL   Basophils Absolute 90 0 - 200 cells/uL   Neutrophils Relative % 49.9 %   Total Lymphocyte 38.6 %   Monocytes Relative 6.1 %   Eosinophils Relative 4.1 %   Basophils Relative 1.3 %  Lipid panel  Result Value Ref Range   Cholesterol 215 (H) <200 mg/dL   HDL 47 (L) >50 mg/dL   Triglycerides 132 <150 mg/dL   LDL Cholesterol (Calc) 142 (H) mg/dL (calc)   Total CHOL/HDL Ratio 4.6 <5.0 (calc)   Non-HDL Cholesterol (Calc) 168 (H) <130 mg/dL (calc)  VITAMIN D 25 Hydroxy (Vit-D Deficiency, Fractures)  Result Value Ref Range   Vit D, 25-Hydroxy 27 (L) 30 - 100 ng/mL  TSH  Result Value Ref Range   TSH 1.23 mIU/L      Assessment & Plan:   Problem List Items Addressed This Visit      Cardiovascular and Mediastinum   Benign hypertension    Likely affected by pain, obesity, NSAIDs, stress; will have her work on relaxation response, demonstrated technique; DASH guidelines; PRN pain med tramadol for worst times; continue to work with rheum; close f/u        Other   Vitamin D deficiency disease    Level was just a few points below 30; will not retest for that right now (due to expense); continue supplements      Pain, joint, multiple sites    Seeing rheumatologist, but continuing to have significant pain; I am willing to prescribe a few pills of tramadol for her to have PRN for the worst days; I believe the pain is affecting her quality of life, mood, and blood pressure; reviewed [place] web site prior to prescribing; no red flags      Mild hypercholesterolemia    Patient to return for her labs on another day      Depression, major, recurrent, moderate (HCC)    Supportive listening; her joint pain/body pain is affecting her quality of life; I will treat the pain with PRN  tramadol for now; medicine discussed; close f/u; seek help or call if worsening mood; she agrees          Follow up plan: Return in about 4 weeks (around 02/13/2018).  An after-visit summary was printed and given to the patient at [place].  Please see the patient instructions which may contain other information and recommendations beyond what is mentioned above in the assessment and plan.  Meds ordered this encounter  Medications  . traMADol (ULTRAM) 50 MG tablet    Sig: Take 1 tablet (50 mg total) by mouth every 6 (six) hours as needed.    Dispense:  20 tablet    Refill:  0    No orders of the defined types were placed in this encounter.

## 2018-01-21 DIAGNOSIS — I10 Essential (primary) hypertension: Secondary | ICD-10-CM | POA: Insufficient documentation

## 2018-01-21 NOTE — Assessment & Plan Note (Signed)
Likely affected by pain, obesity, NSAIDs, stress; will have her work on relaxation response, demonstrated technique; DASH guidelines; PRN pain med tramadol for worst times; continue to work with rheum; close f/u

## 2018-01-21 NOTE — Assessment & Plan Note (Signed)
Level was just a few points below 30; will not retest for that right now (due to expense); continue supplements

## 2018-01-21 NOTE — Assessment & Plan Note (Signed)
Patient to return for her labs on another day

## 2018-01-21 NOTE — Assessment & Plan Note (Signed)
Seeing rheumatologist, but continuing to have significant pain; I am willing to prescribe a few pills of tramadol for her to have PRN for the worst days; I believe the pain is affecting her quality of life, mood, and blood pressure; reviewed [place] web site prior to prescribing; no red flags

## 2018-01-21 NOTE — Assessment & Plan Note (Signed)
Supportive listening; her joint pain/body pain is affecting her quality of life; I will treat the pain with PRN tramadol for now; medicine discussed; close f/u; seek help or call if worsening mood; she agrees

## 2018-01-30 ENCOUNTER — Emergency Department
Admission: EM | Admit: 2018-01-30 | Discharge: 2018-01-30 | Disposition: A | Discharge status: Home / Self Care | Payer: BC Managed Care – PPO | Attending: Student in an Organized Health Care Education/Training Program | Admitting: Student in an Organized Health Care Education/Training Program

## 2018-01-30 ENCOUNTER — Other Ambulatory Visit: Payer: Self-pay

## 2018-01-30 ENCOUNTER — Emergency Department: Payer: BC Managed Care – PPO

## 2018-01-30 ENCOUNTER — Encounter: Payer: Self-pay | Admitting: Emergency Medicine

## 2018-01-30 DIAGNOSIS — Z85828 Personal history of other malignant neoplasm of skin: Secondary | ICD-10-CM | POA: Diagnosis not present

## 2018-01-30 DIAGNOSIS — J45909 Unspecified asthma, uncomplicated: Secondary | ICD-10-CM | POA: Insufficient documentation

## 2018-01-30 DIAGNOSIS — R1032 Left lower quadrant pain: Secondary | ICD-10-CM | POA: Diagnosis present

## 2018-01-30 DIAGNOSIS — Z79899 Other long term (current) drug therapy: Secondary | ICD-10-CM | POA: Diagnosis not present

## 2018-01-30 DIAGNOSIS — M5442 Lumbago with sciatica, left side: Secondary | ICD-10-CM | POA: Insufficient documentation

## 2018-01-30 LAB — CBC WITH DIFFERENTIAL/PLATELET
BASOS PCT: 1 %
Basophils Absolute: 0.1 10*3/uL (ref 0–0.1)
EOS ABS: 0.2 10*3/uL (ref 0–0.7)
EOS PCT: 3 %
HCT: 43.2 % (ref 35.0–47.0)
Hemoglobin: 14.3 g/dL (ref 12.0–16.0)
LYMPHS ABS: 2.8 10*3/uL (ref 1.0–3.6)
Lymphocytes Relative: 32 %
MCH: 28.8 pg (ref 26.0–34.0)
MCHC: 33.2 g/dL (ref 32.0–36.0)
MCV: 86.8 fL (ref 80.0–100.0)
MONO ABS: 0.5 10*3/uL (ref 0.2–0.9)
MONOS PCT: 6 %
NEUTROS PCT: 58 %
Neutro Abs: 5.3 10*3/uL (ref 1.4–6.5)
PLATELETS: 294 10*3/uL (ref 150–440)
RBC: 4.98 MIL/uL (ref 3.80–5.20)
RDW: 13.5 % (ref 11.5–14.5)
WBC: 8.9 10*3/uL (ref 3.6–11.0)

## 2018-01-30 LAB — URINALYSIS, COMPLETE (UACMP) WITH MICROSCOPIC
BILIRUBIN URINE: NEGATIVE
Bacteria, UA: NONE SEEN
GLUCOSE, UA: NEGATIVE mg/dL
HGB URINE DIPSTICK: NEGATIVE
KETONES UR: NEGATIVE mg/dL
LEUKOCYTES UA: NEGATIVE
NITRITE: NEGATIVE
PH: 6 (ref 5.0–8.0)
Protein, ur: NEGATIVE mg/dL
SPECIFIC GRAVITY, URINE: 1.012 (ref 1.005–1.030)
SQUAMOUS EPITHELIAL / LPF: NONE SEEN

## 2018-01-30 LAB — COMPREHENSIVE METABOLIC PANEL
ALBUMIN: 4.1 g/dL (ref 3.5–5.0)
ALK PHOS: 74 U/L (ref 38–126)
ALT: 21 U/L (ref 14–54)
ANION GAP: 8 (ref 5–15)
AST: 19 U/L (ref 15–41)
BUN: 17 mg/dL (ref 6–20)
CALCIUM: 8.9 mg/dL (ref 8.9–10.3)
CO2: 25 mmol/L (ref 22–32)
Chloride: 102 mmol/L (ref 101–111)
Creatinine, Ser: 0.66 mg/dL (ref 0.44–1.00)
GFR calc Af Amer: >60 mL/min (ref >60)
GFR calc non Af Amer: >60 mL/min (ref >60)
GLUCOSE: 82 mg/dL (ref 65–99)
POTASSIUM: 3.7 mmol/L (ref 3.5–5.1)
SODIUM: 135 mmol/L (ref 135–145)
TOTAL PROTEIN: 7.3 g/dL (ref 6.5–8.1)
Total Bilirubin: 0.7 mg/dL (ref 0.3–1.2)

## 2018-01-30 MED ORDER — CYCLOBENZAPRINE HCL 10 MG PO TABS: 10.0000 mg | ORAL_TABLET | Freq: Three times a day (TID) | ORAL | 0 refills | Status: DC | PRN

## 2018-01-30 MED ORDER — FENTANYL CITRATE (PF) 100 MCG/2ML IJ SOLN
50.0000 ug | INTRAMUSCULAR | Status: DC | PRN
  Administered 2018-01-30: 50 ug via INTRAVENOUS
  Filled 2018-01-30: qty 2

## 2018-01-30 MED ORDER — SODIUM CHLORIDE 0.9 % IV BOLUS (SEPSIS)
1000.0000 mL | Freq: Once | INTRAVENOUS | Status: AC
  Administered 2018-01-30: 1000 mL via INTRAVENOUS

## 2018-01-30 MED ORDER — BUPIVACAINE HCL (PF) 0.5 % IJ SOLN
30.0000 mL | Freq: Once | INTRAMUSCULAR | Status: AC
  Administered 2018-01-30: 30 mL
  Filled 2018-01-30 (×2): qty 30

## 2018-01-30 MED ORDER — BUPIVACAINE HCL (PF) 0.5 % IJ SOLN
INTRAMUSCULAR | Status: AC
  Administered 2018-01-30: 30 mL
  Filled 2018-01-30: qty 30

## 2018-01-30 NOTE — ED Triage Notes (Signed)
Low back pain radiating to L leg x 10 days. Denies fall or injury at onset.

## 2018-01-30 NOTE — ED Provider Notes (Signed)
[facility] Emergency Department Provider Note    First MD Initiated Contact with Patient 01/30/18 1219     (approximate)  I have reviewed the triage vital signs and the nursing notes.   HISTORY  Chief Complaint Back Pain    HPI Yolanda Pace is a 52 y.o. female with a history of anxiety depression obesity being evaluated for arthritis by rheumatology not on any amino logics at this point presents with chief complaint of several days of left lower back pain radiating down left leg associated with some flank pain urinary spasms and nausea.  No vomiting.  No fevers.  No chest pain or shortness of breath.  Is concerned that the bladder sling mesh is malfunctioning which was placed over 15 years ago.  Denies any trauma.  No previous spinal surgeries or epidural.  No history of IV drug abuse.  Past Medical History:  Diagnosis Date  . Allergic rhinitis   . Anxiety   . Asthma   . Cystic thyroid nodule March 2015   74mm on u/s  . Depression   . Insomnia   . Obesity   . Right thyroid nodule 05/26/2017   Solid 1 cm RIGHT lobe July 2018  . Seizures [facility]) June 2015   provoked after cervical nerve block  . Skin cancer Aug 2015   removed from back  . Vitamin D deficiency disease    Family History  Problem Relation Age of Onset  . Hyperlipidemia Mother   . Diabetes Sister   . Hyperlipidemia Sister   . Hypertension Sister   . Hypertension Brother   . Pneumonia Maternal Grandmother   . Heart attack Maternal Grandfather   . Diabetes Sister   . Hypertension Sister   . Epilepsy Sister   . Hypertension Sister   . Hypertension Daughter   . Kidney Stones Daughter   . Cancer Neg Hx   . COPD Neg Hx   . Heart disease Neg Hx   . Stroke Neg Hx    Past Surgical History:  Procedure Laterality Date  . ABDOMINAL HYSTERECTOMY  2005ish   fibroids, ovaries remain; along with bladder tack  . bladder tack  2005ish  . CHOLECYSTECTOMY    . SKIN CANCER EXCISION  Aug 2015     removed from back   Patient Active Problem List   Diagnosis Date Noted  . Benign hypertension 01/21/2018  . Pelvic pain 12/26/2017  . Depression, major, recurrent, moderate ([place]) 12/26/2017  . GERD (gastroesophageal reflux disease) 11/09/2017  . 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 Elevated C-reactive protein (CRP) 07/11/2017  . Right thyroid nodule 05/26/2017  . Mild hypercholesterolemia 05/06/2017  . Medication monitoring encounter 05/06/2017  . Generalized osteoarthritis of hand 12/13/2016  . Impingement syndrome of shoulder region, left 11/29/2016  . Heartburn 11/16/2016  . Pain, joint, multiple sites 05/07/2016  . Preventative health care 10/18/2015  . Elevated blood pressure reading 10/18/2015  . Breast cancer screening 10/17/2015  . Asthma   . Anxiety   . Depression   . Allergic rhinitis   . Insomnia   . Obesity   . Vitamin D deficiency disease   . Cystic thyroid nodule 01/13/2014      Prior to Admission medications   Medication Sig Start Date End Date Taking? Authorizing Provider  ciprofloxacin (CIPRO) 500 MG tablet Take 500 mg by mouth 2 (two) times daily.   Yes [provider]  albuterol (PROAIR HFA) 108 (90 Base) MCG/ACT inhaler Inhale 2 puffs into  the lungs every 4 (four) hours as needed for wheezing or shortness of breath. 10/18/17   Lada, Satira Anis, MD  albuterol (PROVENTIL) (2.5 MG/3ML) 0.083% nebulizer solution Take 3 mLs (2.5 mg total) by nebulization every 4 (four) hours as needed for wheezing or shortness of breath. 10/18/17   Arnetha Courser, MD  cholecalciferol (VITAMIN D) 1000 units tablet Take 2,000 Units by mouth daily.     [provider]  cyclobenzaprine (FLEXERIL) 10 MG tablet Take 1 tablet (10 mg total) by mouth 3 (three) times daily as needed for muscle spasms. 01/30/18   Merlyn Lot, MD  levocetirizine (XYZAL) 5 MG tablet Take 1 tablet (5 mg total) by mouth every evening. 11/09/17   Lada, Satira Anis, MD  mometasone  (NASONEX) 50 MCG/ACT nasal spray Place 2 sprays into the nose daily. 10/18/17   Lada, Satira Anis, MD  montelukast (SINGULAIR) 10 MG tablet Take 1 tablet (10 mg total) by mouth at bedtime. 11/09/17   Arnetha Courser, MD  omeprazole (PRILOSEC) 20 MG capsule Take 20 mg by mouth daily.    [provider]  sertraline (ZOLOFT) 100 MG tablet Take 1 tablet (100 mg total) by mouth daily. 12/26/17   Lada, Satira Anis, MD  traMADol (ULTRAM) 50 MG tablet Take 1 tablet (50 mg total) by mouth every 6 (six) hours as needed. 01/16/18   Arnetha Courser, MD    Allergies Ioxaglate; Codeine; Compazine  [prochlorperazine]; Contrast media [iodinated diagnostic agents]; Doxycycline; Iodine; Prochlorperazine edisylate; and Sulfur    Social History Social History   Tobacco Use  . Smoking status: Never Smoker  . Smokeless tobacco: Never Used  Substance Use Topics  . Alcohol use: No  . Drug use: No    Review of Systems Patient denies headaches, rhinorrhea, blurry vision, numbness, shortness of breath, chest pain, edema, cough, abdominal pain, nausea, vomiting, diarrhea, dysuria, fevers, rashes or hallucinations unless otherwise stated above in HPI. ____________________________________________   PHYSICAL EXAM:  VITAL SIGNS: Vitals:   01/30/18 1116  BP: (!) 170/78  Pulse: 70  Resp: 18  Temp: 97.9 F (36.6 C)  SpO2: 95%    Constitutional: Alert and oriented. Well appearing and in no acute distress. Eyes: Conjunctivae are normal.  Head: Atraumatic. Nose: No congestion/rhinnorhea. Mouth/Throat: Mucous membranes are moist.   Neck: No stridor. Painless ROM.  Cardiovascular: Normal rate, regular rhythm. Grossly normal heart sounds.  Good peripheral circulation. Respiratory: Normal respiratory effort.  No retractions. Lungs CTAB. Gastrointestinal: Soft and nontender. No distention. No abdominal bruits. No CVA tenderness. Genitourinary:  Musculoskeletal: There is tenderness to palpation over the left  SI joint extending down the left gluteus.  No midline tenderness to palpation.  No step-offs or deformities.  No overlying erythema.  No lower extremity tenderness nor edema.  No joint effusions.  Brisk patellar reflex bilaterally with downgoing Babinski and 2+ Achilles reflex.  Sensation intact distally.  Reproducible pain with left straight leg raise. Neurologic:  Normal speech and language. No gross focal neurologic deficits are appreciated. No facial droop Skin:  Skin is warm, dry and intact. No rash noted. Psychiatric: Mood and affect are normal. Speech and behavior are normal.  ____________________________________________   LABS (all labs ordered are listed, but only abnormal results are displayed)  Results for orders placed or performed during the hospital encounter of 01/30/18 (from the past 24 hour(s))  Urinalysis, Complete w Microscopic     Status: Abnormal   Collection Time: 01/30/18  1:28 PM  Result Value Ref  Range   Color, Urine STRAW (A) YELLOW   APPearance CLEAR (A) CLEAR   Specific Gravity, Urine 1.012 1.005 - 1.030   pH 6.0 5.0 - 8.0   Glucose, UA NEGATIVE NEGATIVE mg/dL   Hgb urine dipstick NEGATIVE NEGATIVE   Bilirubin Urine NEGATIVE NEGATIVE   Ketones, ur NEGATIVE NEGATIVE mg/dL   Protein, ur NEGATIVE NEGATIVE mg/dL   Nitrite NEGATIVE NEGATIVE   Leukocytes, UA NEGATIVE NEGATIVE   RBC / HPF 0-5 0 - 5 RBC/hpf   WBC, UA 0-5 0 - 5 WBC/hpf   Bacteria, UA NONE SEEN NONE SEEN   Squamous Epithelial / LPF NONE SEEN NONE SEEN   Mucus PRESENT   CBC with Differential/Platelet     Status: None   Collection Time: 01/30/18  1:29 PM  Result Value Ref Range   WBC 8.9 3.6 - 11.0 K/uL   RBC 4.98 3.80 - 5.20 MIL/uL   Hemoglobin 14.3 12.0 - 16.0 g/dL   HCT 43.2 35.0 - 47.0 %   MCV 86.8 80.0 - 100.0 fL   MCH 28.8 26.0 - 34.0 pg   MCHC 33.2 32.0 - 36.0 g/dL   RDW 13.5 11.5 - 14.5 %   Platelets 294 150 - 440 K/uL   Neutrophils Relative % 58 %   Neutro Abs 5.3 1.4 - 6.5 K/uL    Lymphocytes Relative 32 %   Lymphs Abs 2.8 1.0 - 3.6 K/uL   Monocytes Relative 6 %   Monocytes Absolute 0.5 0.2 - 0.9 K/uL   Eosinophils Relative 3 %   Eosinophils Absolute 0.2 0 - 0.7 K/uL   Basophils Relative 1 %   Basophils Absolute 0.1 0 - 0.1 K/uL  Comprehensive metabolic panel     Status: None   Collection Time: 01/30/18  1:29 PM  Result Value Ref Range   Sodium 135 135 - 145 mmol/L   Potassium 3.7 3.5 - 5.1 mmol/L   Chloride 102 101 - 111 mmol/L   CO2 25 22 - 32 mmol/L   Glucose, Bld 82 65 - 99 mg/dL   BUN 17 6 - 20 mg/dL   Creatinine, Ser 0.66 0.44 - 1.00 mg/dL   Calcium 8.9 8.9 - 10.3 mg/dL   Total Protein 7.3 6.5 - 8.1 g/dL   Albumin 4.1 3.5 - 5.0 g/dL   AST 19 15 - 41 U/L   ALT 21 14 - 54 U/L   Alkaline Phosphatase 74 38 - 126 U/L   Total Bilirubin 0.7 0.3 - 1.2 mg/dL   GFR calc non Af Amer >60 >60 mL/min   GFR calc Af Amer >60 >60 mL/min   Anion gap 8 5 - 15   ____________________________________________ ___________________________________  RADIOLOGY  I personally reviewed all radiographic images ordered to evaluate for the above acute complaints and reviewed radiology reports and findings.  These findings were personally discussed with the patient.  Please see medical record for radiology report.  ____________________________________________   PROCEDURES  Procedure(s) performed:  Procedures Procedure: Trigger point injection. The patient agreed to the procedure without reservation, and acknowledging the risks of infection, nerve damage, damage to internal organs, lack of efficacy, bleeding. The left superior gluteus region was prepped with chloraprep and allowed to dry. A 10 cc syringe was loaded with 0.5% Bupivacaine and a 25 ga needle advanced toward the areas of discomfort The plunger was withdrawn before injection. The patient tolerated the procedure well.    Critical Care performed: no ____________________________________________   INITIAL  IMPRESSION / ASSESSMENT AND PLAN /  ED COURSE  Pertinent labs & imaging results that were available during my care of the patient were reviewed by me and considered in my medical decision making (see chart for details).  DDX: msk, lumbago, sacroiliitis, ddd, stone, uti, bladder spasm, ss, ce  Yolanda Pace is a 51 y.o. who presents to the ED with acute low back pain and sx descrbied above.  No history of injury or trauma. No recent back instrumentation/procedures. No fevers. Denies cord compression symptoms. No bowel/bladder incontinence or retention, no LE weakness. VSS in ED. Exam with no LE weakness bilat., no sensory deficits, normal DTRs, no clonus, no saddle anesthesia. Pain withpalpation of back and with trunk movement. Likely MSK related pain, probable lumbar strain.  Clinical Course as of Jan 31 1504  Mon Jan 30, 2018  1428 Patient reassessed after blood work.  Discussed reassuring blood work as well as CT imaging.  No evidence of UTI kidney stone.  Do suspect musculoskeletal strain probably component of sciatica no evidence of cauda equina or stenosis.  Discussed risks and benefits of further medical treatment including trigger point injection patient and husband have agreed to trial of trigger point injection.  [PR]  1504 Patient reassessed and feels improvement in symptoms.  At this point do believe she is stable and appropriate for outpatient follow-up.  Discussed signs and symptoms for which she should return immediately to the hospital.  [PR]    Clinical Course User Index [PR] Merlyn Lot, MD   Clinical picture is not consistent with epidural abscess , fracture, or cauda equina syndrome. Plan supportive care, follow up for recheck   As part of my medical decision making, I reviewed the following data within the [place] notes reviewed and incorporated, Labs reviewed, notes from prior ED  visits.   ____________________________________________   FINAL CLINICAL IMPRESSION(S) / ED DIAGNOSES  Final diagnoses:  Acute left-sided low back pain with left-sided sciatica      NEW MEDICATIONS STARTED DURING THIS VISIT:  New Prescriptions   CYCLOBENZAPRINE (FLEXERIL) 10 MG TABLET    Take 1 tablet (10 mg total) by mouth 3 (three) times daily as needed for muscle spasms.     Note:  This document was prepared using Dragon voice recognition software and may include unintentional dictation errors.    Merlyn Lot, MD 01/30/18 1505

## 2018-01-30 NOTE — ED Notes (Signed)
FIRST NURSE NOTE: pt c/o pain radiating from the the tailbone up into the lower back with dizziness/lightheaded for the past week, states she was seen at [facility] yesterday and they checked her urine that was negative.Marland Kitchen

## 2018-01-30 NOTE — ED Notes (Addendum)
See triage note  Presents with a 10 day hx of lower back pain  States pain is at tailbone and radiates into left leg.Marland KitchenAlso has had some bladder spasms with some nausea    She is also having some dizziness and blurred vision and feeling syncopal    States she has been taking Ibu for lower back with tramadol occasionally ambulates slowly and with sl limp to treatment room

## 2018-01-30 NOTE — Discharge Instructions (Signed)

## 2018-01-31 ENCOUNTER — Encounter: Payer: Self-pay | Admitting: Family Medicine

## 2018-01-31 ENCOUNTER — Ambulatory Visit: Payer: BC Managed Care – PPO | Admitting: Family Medicine

## 2018-01-31 VITALS — BP 140/80 | HR 80 | Temp 98.2°F | Resp 16 | Wt 180.0 lb

## 2018-01-31 DIAGNOSIS — E559 Vitamin D deficiency, unspecified: Secondary | ICD-10-CM | POA: Diagnosis not present

## 2018-01-31 DIAGNOSIS — M2550 Pain in unspecified joint: Secondary | ICD-10-CM | POA: Diagnosis not present

## 2018-01-31 DIAGNOSIS — R7989 Other specified abnormal findings of blood chemistry: Secondary | ICD-10-CM | POA: Diagnosis not present

## 2018-01-31 DIAGNOSIS — E041 Nontoxic single thyroid nodule: Secondary | ICD-10-CM | POA: Diagnosis not present

## 2018-01-31 DIAGNOSIS — R7982 Elevated C-reactive protein (CRP): Secondary | ICD-10-CM | POA: Diagnosis not present

## 2018-01-31 DIAGNOSIS — R5383 Other fatigue: Secondary | ICD-10-CM | POA: Diagnosis not present

## 2018-01-31 DIAGNOSIS — F331 Major depressive disorder, recurrent, moderate: Secondary | ICD-10-CM

## 2018-01-31 DIAGNOSIS — R4586 Emotional lability: Secondary | ICD-10-CM

## 2018-01-31 DIAGNOSIS — I10 Essential (primary) hypertension: Secondary | ICD-10-CM | POA: Diagnosis not present

## 2018-01-31 MED ORDER — SERTRALINE HCL 50 MG PO TABS: 50.0000 mg | ORAL_TABLET | Freq: Every day | ORAL | 3 refills | Status: DC

## 2018-01-31 MED ORDER — AMLODIPINE BESYLATE 5 MG PO TABS: 5.0000 mg | ORAL_TABLET | Freq: Every day | ORAL | 3 refills | Status: DC

## 2018-01-31 MED ORDER — TRAMADOL HCL 50 MG PO TABS: 50.0000 mg | ORAL_TABLET | Freq: Four times a day (QID) | ORAL | 0 refills | Status: DC | PRN

## 2018-01-31 MED ORDER — FEXOFENADINE HCL 180 MG PO TABS: 180.0000 mg | ORAL_TABLET | Freq: Every day | ORAL | 3 refills | Status: DC

## 2018-01-31 NOTE — Assessment & Plan Note (Addendum)
She has been taking a lot of ibuprofen; stress and NSAID and pain are likely what are affecting her pressures right now; DASH guidelines; add med

## 2018-01-31 NOTE — Patient Instructions (Addendum)
Try to follow the DASH guidelines (DASH stands for Dietary Approaches to Stop Hypertension). Try to limit the sodium in your diet to no more than 1,500mg  of sodium per day. Certainly try to not exceed 2,000 mg per day at the very most. Do not add salt when cooking or at the table.  Check the sodium amount on labels when shopping, and choose items lower in sodium when given a choice. Avoid or limit foods that already contain a lot of sodium. Eat a diet rich in fruits and vegetables and whole grains, and try to lose weight if overweight or obese  Feel free to use the tramadol up to four times a day until you see rheumatology because I think that will help your pain more safely than the ibuprofen Tylenol 500 mg with each tramadol may help its efficacy  Return for early morning labs on Monday  Decrease the sertraline back down to 50 mg daily  Start the new blood pressure medicine

## 2018-01-31 NOTE — Progress Notes (Signed)
BP 140/80   Pulse 80   Temp 98.2 F (36.8 C) (Oral)   Resp 16   Wt 180 lb (81.6 kg)   SpO2 96%   BMI 31.89 kg/m    Subjective:    Patient ID: Yolanda Pace, female    DOB: May 16, 1966, 52 y.o.   MRN: [id number]  HPI: Yolanda Pace is a 52 y.o. female  Chief Complaint  Patient presents with  . Hypertension    Blood pressure has been elevated the last few times it has been checked.   . Back Pain    A lot of pain in the back. Almost passed out yesterday and went to the ER. They state it may have been a pinched nerve. Has also experienced fatigue.  . Depression    med was increased at last visit    HPI Patient is here for high blood pressure; it has been great her whole life and now it's been running high BP was 171 at urgent care on Sunday BP was 170/78 at the ER yesterday BP was 151/91 at the GI on 12/19/17  She had been taking vitamin D 5000 iu and now dropped down to 2,000 iu daily  She has been having pain in her back; she went to the ER yesterday and they told her she might have a pinched nerve; she is being evaluated by rheumatology for arthritis; they gave her some flexeril 10 mg and that helped her sleep, knocked her out, but she's been sleeping a lot anyway; I asked if it helped her back, and she says it helped her back but not tailbone; she went to the chiropractor  She has taken 7 tramadol since her last visit; still has 13 left Reviewed CMP, normal; CBC was normal; urine was normal; they think it is a   She has been fatigued; sleeping the whole entire weekends; eats mostly chicken; not much meat  She has depression and her medicine was adjusted last visit; now sertraline is 100 mg and since then she has been tired and laying in bed; cannot stay awake; Saturday was her anniversary; she just slept on the couch, did not have the energy to go to the movies; lost 5 pounds since last visit; she had picked out who she was going to for counseling; she feels shaky inside and her  heart has been jumping; not sure if blood pressure  She went to the walk-in clinic on Sunday and they did not see anything in her urine; she took the cipro just twice  Depression screen [facility] 2/9 01/31/2018 01/16/2018 12/26/2017 11/09/2017 10/18/2017  Decreased Interest 0 1 1 0 0  Down, Depressed, Hopeless 3 1 1  0 0  PHQ - 2 Score 3 2 2  0 0  Altered sleeping 3 1 2  - -  Tired, decreased energy 3 1 2  - -  Change in appetite 0 1 1 - -  Feeling bad or failure about yourself  0 0 2 - -  Trouble concentrating 3 3 3  - -  Moving slowly or fidgety/restless 1 1 2  - -  Suicidal thoughts 0 0 0 - -  PHQ-9 Score 13 9 14  - -  Difficult doing work/chores Extremely dIfficult Somewhat difficult Very difficult - -   Relevant past medical, surgical, family and social history reviewed Past Medical History:  Diagnosis Date  . Allergic rhinitis   . Anxiety   . Asthma   . Cystic thyroid nodule March 2015   78mm on  u/s  . Depression   . Insomnia   . Obesity   . Right thyroid nodule 05/26/2017   Solid 1 cm RIGHT lobe July 2018  . Seizures [facility]) June 2015   provoked after cervical nerve block  . Skin cancer Aug 2015   removed from back  . Vitamin D deficiency disease    Past Surgical History:  Procedure Laterality Date  . ABDOMINAL HYSTERECTOMY  2005ish   fibroids, ovaries remain; along with bladder tack  . bladder tack  2005ish  . CHOLECYSTECTOMY    . SKIN CANCER EXCISION  Aug 2015   removed from back   Family History  Problem Relation Age of Onset  . Hyperlipidemia Mother   . Diabetes Sister   . Hyperlipidemia Sister   . Hypertension Sister   . Hypertension Brother   . Pneumonia Maternal Grandmother   . Heart attack Maternal Grandfather   . Diabetes Sister   . Hypertension Sister   . Epilepsy Sister   . Hypertension Sister   . Hypertension Daughter   . Kidney Stones Daughter   . Cancer Neg Hx   . COPD Neg Hx   . Heart disease Neg Hx   . Stroke Neg Hx    Social History   Tobacco Use    . Smoking status: Never Smoker  . Smokeless tobacco: Never Used  Substance Use Topics  . Alcohol use: No  . Drug use: No    Interim medical history since last visit reviewed. Allergies and medications reviewed  Review of Systems Per HPI unless specifically indicated above     Objective:    BP 140/80   Pulse 80   Temp 98.2 F (36.8 C) (Oral)   Resp 16   Wt 180 lb (81.6 kg)   SpO2 96%   BMI 31.89 kg/m   Wt Readings from Last 3 Encounters:  01/31/18 180 lb (81.6 kg)  01/30/18 180 lb (81.6 kg)  01/16/18 185 lb 3.2 oz (84 kg)    Physical Exam  Constitutional: She appears well-developed and well-nourished. No distress.  obese  Eyes: EOM are normal. No scleral icterus.  Neck: No thyromegaly present.  Cardiovascular: Normal rate.  Pulmonary/Chest: Effort normal.  Abdominal: She exhibits no distension.  Skin: She is not diaphoretic. No pallor.  Psychiatric: Her behavior is normal. Judgment and thought content normal. Cognition and memory are normal. She exhibits a depressed mood.  Good eye contact with examiner; tearful    Results for orders placed or performed during the hospital encounter of 01/30/18  Urinalysis, Complete w Microscopic  Result Value Ref Range   Color, Urine STRAW (A) YELLOW   APPearance CLEAR (A) CLEAR   Specific Gravity, Urine 1.012 1.005 - 1.030   pH 6.0 5.0 - 8.0   Glucose, UA NEGATIVE NEGATIVE mg/dL   Hgb urine dipstick NEGATIVE NEGATIVE   Bilirubin Urine NEGATIVE NEGATIVE   Ketones, ur NEGATIVE NEGATIVE mg/dL   Protein, ur NEGATIVE NEGATIVE mg/dL   Nitrite NEGATIVE NEGATIVE   Leukocytes, UA NEGATIVE NEGATIVE   RBC / HPF 0-5 0 - 5 RBC/hpf   WBC, UA 0-5 0 - 5 WBC/hpf   Bacteria, UA NONE SEEN NONE SEEN   Squamous Epithelial / LPF NONE SEEN NONE SEEN   Mucus PRESENT   CBC with Differential/Platelet  Result Value Ref Range   WBC 8.9 3.6 - 11.0 K/uL   RBC 4.98 3.80 - 5.20 MIL/uL   Hemoglobin 14.3 12.0 - 16.0 g/dL   HCT 43.2 35.0 -  47.0 %    MCV 86.8 80.0 - 100.0 fL   MCH 28.8 26.0 - 34.0 pg   MCHC 33.2 32.0 - 36.0 g/dL   RDW 13.5 11.5 - 14.5 %   Platelets 294 150 - 440 K/uL   Neutrophils Relative % 58 %   Neutro Abs 5.3 1.4 - 6.5 K/uL   Lymphocytes Relative 32 %   Lymphs Abs 2.8 1.0 - 3.6 K/uL   Monocytes Relative 6 %   Monocytes Absolute 0.5 0.2 - 0.9 K/uL   Eosinophils Relative 3 %   Eosinophils Absolute 0.2 0 - 0.7 K/uL   Basophils Relative 1 %   Basophils Absolute 0.1 0 - 0.1 K/uL  Comprehensive metabolic panel  Result Value Ref Range   Sodium 135 135 - 145 mmol/L   Potassium 3.7 3.5 - 5.1 mmol/L   Chloride 102 101 - 111 mmol/L   CO2 25 22 - 32 mmol/L   Glucose, Bld 82 65 - 99 mg/dL   BUN 17 6 - 20 mg/dL   Creatinine, Ser 0.66 0.44 - 1.00 mg/dL   Calcium 8.9 8.9 - 10.3 mg/dL   Total Protein 7.3 6.5 - 8.1 g/dL   Albumin 4.1 3.5 - 5.0 g/dL   AST 19 15 - 41 U/L   ALT 21 14 - 54 U/L   Alkaline Phosphatase 74 38 - 126 U/L   Total Bilirubin 0.7 0.3 - 1.2 mg/dL   GFR calc non Af Amer >60 >60 mL/min   GFR calc Af Amer >60 >60 mL/min   Anion gap 8 5 - 15      Assessment & Plan:   Problem List Items Addressed This Visit      Cardiovascular and Mediastinum   Benign hypertension - Primary    She has been taking a lot of ibuprofen; stress and NSAID and pain are likely what are affecting her pressures right now; DASH guidelines; add med      Relevant Medications   amLODipine (NORVASC) 5 MG tablet     Endocrine   Right thyroid nodule    Managed by ENT      Relevant Orders   T4, free   TSH     Other   Elevated C-reactive protein (CRP)    Seeing rheumatologist on Monday      Depression, major, recurrent, moderate (HCC)    trintellix      Relevant Medications   sertraline (ZOLOFT) 50 MG tablet   Vitamin D deficiency disease    2,000 iu daily; last level a few months ago (dec) was just below normal      Pain, joint, multiple sites    Affecting her quality of life; will provide a little more  tramadol for relief, as I don't want her relying on just NSAIDs which can raise her BP      Elevated beta-2 microglobulin    These were never rechecked last year; not sure if lab did not receive specimen or patient did not return; will re-order      Relevant Orders   Beta 2 microglobulin, serum   Protein,Total and Elect and IFE,24   Protein Electrophoresis, (serum)    Other Visit Diagnoses    Mood swings       check labs, depression likely involved   Relevant Orders   FSH/LH   Other fatigue       will check labs   Relevant Orders   Cortisol   Magnesium       Follow  up plan: No follow-ups on file.  An after-visit summary was printed and given to the patient at [place].  Please see the patient instructions which may contain other information and recommendations beyond what is mentioned above in the assessment and plan.  Meds ordered this encounter  Medications  . fexofenadine (ALLEGRA ALLERGY) 180 MG tablet    Sig: Take 1 tablet (180 mg total) by mouth daily.    Dispense:  90 tablet    Refill:  3  . amLODipine (NORVASC) 5 MG tablet    Sig: Take 1 tablet (5 mg total) by mouth daily.    Dispense:  90 tablet    Refill:  3  . sertraline (ZOLOFT) 50 MG tablet    Sig: Take 1 tablet (50 mg total) by mouth daily.    Dispense:  30 tablet    Refill:  3  . traMADol (ULTRAM) 50 MG tablet    Sig: Take 1 tablet (50 mg total) by mouth every 6 (six) hours as needed.    Dispense:  20 tablet    Refill:  0    Orders Placed This Encounter  Procedures  . FSH/LH  . Cortisol  . T4, free  . TSH  . Magnesium  . Beta 2 microglobulin, serum  . Protein,Total and Elect and IFE,24  . Protein Electrophoresis, (serum)

## 2018-01-31 NOTE — Assessment & Plan Note (Signed)
Seeing rheumatologist on Monday

## 2018-01-31 NOTE — Assessment & Plan Note (Signed)
Managed by ENT 

## 2018-01-31 NOTE — Assessment & Plan Note (Addendum)
2,000 iu daily; last level a few months ago (dec) was just below normal

## 2018-01-31 NOTE — Assessment & Plan Note (Signed)
trintellix

## 2018-02-04 NOTE — Assessment & Plan Note (Signed)
These were never rechecked last year; not sure if lab did not receive specimen or patient did not return; will re-order

## 2018-02-04 NOTE — Assessment & Plan Note (Signed)
Affecting her quality of life; will provide a little more tramadol for relief, as I don't want her relying on just NSAIDs which can raise her BP

## 2018-02-06 ENCOUNTER — Other Ambulatory Visit: Payer: Self-pay | Admitting: Family Medicine

## 2018-02-06 ENCOUNTER — Other Ambulatory Visit: Payer: Self-pay

## 2018-02-06 DIAGNOSIS — R5383 Other fatigue: Secondary | ICD-10-CM

## 2018-02-06 DIAGNOSIS — R7989 Other specified abnormal findings of blood chemistry: Secondary | ICD-10-CM

## 2018-02-06 DIAGNOSIS — E7800 Pure hypercholesterolemia, unspecified: Secondary | ICD-10-CM

## 2018-02-06 DIAGNOSIS — R4586 Emotional lability: Secondary | ICD-10-CM

## 2018-02-06 DIAGNOSIS — Z5181 Encounter for therapeutic drug level monitoring: Secondary | ICD-10-CM

## 2018-02-06 DIAGNOSIS — E041 Nontoxic single thyroid nodule: Secondary | ICD-10-CM

## 2018-02-06 DIAGNOSIS — E785 Hyperlipidemia, unspecified: Secondary | ICD-10-CM

## 2018-02-08 LAB — LIPID PANEL
CHOL/HDL RATIO: 4.2 (calc) (ref <5.0)
Cholesterol: 203 mg/dL — ABNORMAL HIGH (ref <200)
HDL: 48 mg/dL — ABNORMAL LOW (ref >50)
LDL CHOLESTEROL (CALC): 128 mg/dL — AB
Non-HDL Cholesterol (Calc): 155 mg/dL (calc) — ABNORMAL HIGH (ref <130)
TRIGLYCERIDES: 154 mg/dL — AB (ref <150)

## 2018-02-08 LAB — FSH/LH
FSH: 50.2 m[IU]/mL
LH: 27.1 m[IU]/mL

## 2018-02-08 LAB — T4, FREE: Free T4: 1.2 ng/dL (ref 0.8–1.8)

## 2018-02-08 LAB — CORTISOL: Cortisol, Plasma: 16.1 ug/dL

## 2018-02-08 LAB — ALT: ALT: 20 U/L (ref 6–29)

## 2018-02-08 LAB — PROTEIN ELECTROPHORESIS, SERUM: Total Protein: 6.8 g/dL (ref 6.1–8.1)

## 2018-02-08 LAB — BETA 2 MICROGLOBULIN, SERUM: Beta-2 Microglobulin: 2.06 mg/L (ref <=2.5)

## 2018-02-08 LAB — TSH: TSH: 1.47 mIU/L

## 2018-02-08 LAB — MAGNESIUM: MAGNESIUM: 2.2 mg/dL (ref 1.5–2.5)

## 2018-02-09 LAB — PROTEIN ELECTROPHORESIS, SERUM
ALBUMIN ELP: 4.2 g/dL (ref 3.8–4.8)
ALPHA 2: 0.7 g/dL (ref 0.5–0.9)
Alpha 1: 0.2 g/dL (ref 0.2–0.3)
Beta 2: 0.4 g/dL (ref 0.2–0.5)
Beta Globulin: 0.5 g/dL (ref 0.4–0.6)
Gamma Globulin: 0.8 g/dL (ref 0.8–1.7)
TOTAL PROTEIN: 6.8 g/dL (ref 6.1–8.1)

## 2018-02-09 LAB — LIPID PANEL
Cholesterol: 203 mg/dL — ABNORMAL HIGH (ref <200)
HDL: 48 mg/dL — ABNORMAL LOW (ref >50)
LDL CHOLESTEROL (CALC): 128 mg/dL — AB
NON-HDL CHOLESTEROL (CALC): 155 mg/dL — AB (ref <130)
Total CHOL/HDL Ratio: 4.2 (calc) (ref <5.0)
Triglycerides: 154 mg/dL — ABNORMAL HIGH (ref <150)

## 2018-02-09 LAB — MAGNESIUM: Magnesium: 2.2 mg/dL (ref 1.5–2.5)

## 2018-02-09 LAB — BETA 2 MICROGLOBULIN, SERUM: BETA 2 MICROGLOBULIN: 2.06 mg/L (ref <=2.5)

## 2018-02-09 LAB — CORTISOL: CORTISOL PLASMA: 16.1 ug/dL

## 2018-02-09 LAB — ALT: ALT: 20 U/L (ref 6–29)

## 2018-02-09 LAB — FSH/LH
FSH: 50.2 m[IU]/mL
LH: 27.1 m[IU]/mL

## 2018-02-09 LAB — TSH: TSH: 1.47 m[IU]/L

## 2018-02-09 LAB — T4, FREE: FREE T4: 1.2 ng/dL (ref 0.8–1.8)

## 2018-02-12 ENCOUNTER — Encounter: Payer: Self-pay | Admitting: Family Medicine

## 2018-02-14 ENCOUNTER — Ambulatory Visit: Payer: BC Managed Care – PPO | Admitting: Family Medicine

## 2018-02-14 ENCOUNTER — Encounter: Payer: Self-pay | Admitting: Family Medicine

## 2018-02-14 DIAGNOSIS — I10 Essential (primary) hypertension: Secondary | ICD-10-CM

## 2018-02-14 DIAGNOSIS — F331 Major depressive disorder, recurrent, moderate: Secondary | ICD-10-CM | POA: Diagnosis not present

## 2018-02-14 DIAGNOSIS — J3089 Other allergic rhinitis: Secondary | ICD-10-CM | POA: Diagnosis not present

## 2018-02-14 DIAGNOSIS — R7982 Elevated C-reactive protein (CRP): Secondary | ICD-10-CM

## 2018-02-14 DIAGNOSIS — E6609 Other obesity due to excess calories: Secondary | ICD-10-CM

## 2018-02-14 DIAGNOSIS — Z6832 Body mass index (BMI) 32.0-32.9, adult: Secondary | ICD-10-CM

## 2018-02-14 DIAGNOSIS — M2550 Pain in unspecified joint: Secondary | ICD-10-CM | POA: Diagnosis not present

## 2018-02-14 NOTE — Patient Instructions (Addendum)
Do start the amlodipine half of a pill a day for 3-4 days to get used to it and then a whole pill Continue the DASH guidelines As you lose weight with your new diet, you may not need the blood pressure Feel free to call me about decreasing or stoppng this medicine in the future Check out the information at familydoctor.org entitled "Nutrition for Weight Loss: What You Need to Know about Fad Diets" Try to lose between 1-2 pounds per week by taking in fewer calories and burning off more calories You can succeed by limiting portions, limiting foods dense in calories and fat, becoming more active, and drinking 8 glasses of water a day (64 ounces) Don't skip meals, especially breakfast, as skipping meals may alter your metabolism Do not use over-the-counter weight loss pills or gimmicks that claim rapid weight loss A healthy BMI (or body mass index) is between 18.5 and 24.9 You can calculate your ideal BMI at the [place] website [employer]fr  Obesity, Adult Obesity is having too much body fat. If you have a BMI of 30 or more, you are obese. BMI is a number that explains how much body fat you have. Obesity is often caused by taking in (consuming) more calories than your body uses. Obesity can cause serious health problems. Changing your lifestyle can help to treat obesity. Follow these instructions at home: Eating and drinking   Follow advice from your doctor about what to eat and drink. Your doctor may tell you to: ? Cut down on (limit) fast foods, sweets, and processed snack foods. ? Choose low-fat options. For example, choose low-fat milk instead of whole milk. ? Eat 5 or more servings of fruits or vegetables every day. ? Eat at home more often. This gives you more control over what you eat. ? Choose healthy foods when you eat out. ? Learn what a healthy portion size is. A portion size is the amount of a certain food that is healthy for you to  eat at one time. This is different for each person. ? Keep low-fat snacks available. ? Avoid sugary drinks. These include soda, fruit juice, iced tea that is sweetened with sugar, and flavored milk. ? Eat a healthy breakfast.  Drink enough water to keep your pee (urine) clear or pale yellow.  Do not go without eating for long periods of time (do not fast).  Do not go on popular or trendy diets (fad diets). Physical Activity  Exercise often, as told by your doctor. Ask your doctor: ? What types of exercise are safe for you. ? How often you should exercise.  Warm up and stretch before being active.  Do slow stretching after being active (cool down).  Rest between times of being active. Lifestyle  Limit how much time you spend in front of your TV, computer, or video game system (be less sedentary).  Find ways to reward yourself that do not involve food.  Limit alcohol intake to no more than 1 drink a day for nonpregnant women and 2 drinks a day for men. One drink equals 12 oz of beer, 5 oz of wine, or 1 oz of hard liquor. General instructions  Keep a weight loss journal. This can help you keep track of: ? The food that you eat. ? The exercise that you do.  Take over-the-counter and prescription medicines only as told by your doctor.  Take vitamins and supplements only as told by your doctor.  Think about joining a support group. Your  doctor may be able to help with this.  Keep all follow-up visits as told by your doctor. This is important. Contact a doctor if:  You cannot meet your weight loss goal after you have changed your diet and lifestyle for 6 weeks. This information is not intended to replace advice given to you by your health care provider. Make sure you discuss any questions you have with your health care provider. Document Released: 01/24/2012 Document Revised: 04/08/2016 Document Reviewed: 08/20/2015 Elsevier Interactive Patient Education  2018 [employer].

## 2018-02-14 NOTE — Progress Notes (Signed)
BP 140/70   Pulse 86   Temp 98.5 F (36.9 C) (Oral)   Resp 16   Ht 5\' 3"  (1.6 m)   Wt 183 lb 9.6 oz (83.3 kg)   SpO2 94%   BMI 32.52 kg/m    Subjective:    Patient ID: Yolanda Pace, female    DOB: Jul 08, 1966, 52 y.o.   MRN: [id number]  HPI: Yolanda Pace is a 52 y.o. female  Chief Complaint  Patient presents with  . Follow-up    blood work results and medication management.     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 dealing with stuff; she has been referred; has an appointment for her mesh issues; she has a GI doctor and she will take care of this She saw the rheumatologist; he thinks she has sciatica and plantar fasciitis on the left; she goes back to see him on the 8th; has been on prednisone, just finished on Sunday; pain was much better on prednisone and now starting to creep back up; having trouble with knees and feet at work today; doing well with tramadol Not taking any ibuprofen since last visit (advised to not take with prednisone in the future anyway, risk of ulcer) High blood pressure; checking away from the doctor; checking at [facility], it has been high, but has not taken any amlo; not even buying salt any more;  Coughing more on the allegra; affected by pollen Mood is so much better; the pain is being less has really helped She has her labs done a while back, released on 02/06/18; no results back  Depression screen [facility] 2/9 02/14/2018 01/31/2018 01/16/2018 12/26/2017 11/09/2017  Decreased Interest 0 0 1 1 0  Down, Depressed, Hopeless 0 3 1 1  0  PHQ - 2 Score 0 3 2 2  0  Altered sleeping - 3 1 2  -  Tired, decreased energy - 3 1 2  -  Change in appetite - 0 1 1 -  Feeling bad or failure about yourself  - 0 0 2 -  Trouble concentrating - 3 3 3  -  Moving slowly or fidgety/restless - 1 1 2  -  Suicidal thoughts - 0 0 0 -  PHQ-9 Score - 13 9 14  -  Difficult doing work/chores -  Extremely dIfficult Somewhat difficult Very difficult -    Relevant past medical, surgical, family and social history reviewed Past Medical History:  Diagnosis Date  . Allergic rhinitis   . Anxiety   . Asthma   . Cystic thyroid nodule March 2015   76mm on u/s  . Depression   . Insomnia   . Obesity   . Right thyroid nodule 05/26/2017   Solid 1 cm RIGHT lobe July 2018  . Seizures [facility]) June 2015   provoked after cervical nerve block  . Skin cancer Aug 2015   removed from back  . Vitamin D deficiency disease    Past Surgical History:  Procedure Laterality Date  . ABDOMINAL HYSTERECTOMY  2005ish   fibroids, ovaries remain; along with bladder tack  . bladder tack  2005ish  . CHOLECYSTECTOMY    . SKIN CANCER EXCISION  Aug 2015   removed from back   Family History  Problem Relation Age of Onset  . Hyperlipidemia Mother   . Diabetes Sister   . Hyperlipidemia Sister   . Hypertension Sister   . Hypertension Brother   .  Pneumonia Maternal Grandmother   . Heart attack Maternal Grandfather   . Diabetes Sister   . Hypertension Sister   . Epilepsy Sister   . Hypertension Sister   . Hypertension Daughter   . Kidney Stones Daughter   . Cancer Neg Hx   . COPD Neg Hx   . Heart disease Neg Hx   . Stroke Neg Hx    Social History   Tobacco Use  . Smoking status: Never Smoker  . Smokeless tobacco: Never Used  Substance Use Topics  . Alcohol use: No  . Drug use: No    Interim medical history since last visit reviewed. Allergies and medications reviewed  Review of Systems Per HPI unless specifically indicated above     Objective:    BP 140/70   Pulse 86   Temp 98.5 F (36.9 C) (Oral)   Resp 16   Ht 5\' 3"  (1.6 m)   Wt 183 lb 9.6 oz (83.3 kg)   SpO2 94%   BMI 32.52 kg/m   Wt Readings from Last 3 Encounters:  02/14/18 183 lb 9.6 oz (83.3 kg)  01/31/18 180 lb (81.6 kg)  01/30/18 180 lb (81.6 kg)    Physical Exam  Constitutional: She appears well-developed and  well-nourished. No distress.  HENT:  Head: Normocephalic and atraumatic.  Eyes: EOM are normal. No scleral icterus.  Neck: No thyromegaly present.  Cardiovascular: Normal rate, regular rhythm and normal heart sounds.  No murmur heard. Pulmonary/Chest: Effort normal and breath sounds normal. No respiratory distress. She has no wheezes.  Abdominal: Soft. Bowel sounds are normal. She exhibits no distension.  Musculoskeletal: Normal range of motion. She exhibits no edema.  Neurological: She is alert. She exhibits normal muscle tone.  Skin: Skin is warm and dry. She is not diaphoretic. No pallor.  Psychiatric: She has a normal mood and affect. Her behavior is normal. Judgment and thought content normal.    Results for orders placed or performed in visit on 02/06/18  Lipid panel  Result Value Ref Range   Cholesterol 203 (H) <200 mg/dL   HDL 48 (L) >50 mg/dL   Triglycerides 154 (H) <150 mg/dL   LDL Cholesterol (Calc) 128 (H) mg/dL (calc)   Total CHOL/HDL Ratio 4.2 <5.0 (calc)   Non-HDL Cholesterol (Calc) 155 (H) <130 mg/dL (calc)  Magnesium  Result Value Ref Range   Magnesium 2.2 1.5 - 2.5 mg/dL  Protein electrophoresis, serum  Result Value Ref Range   Total Protein 6.8 6.1 - 8.1 g/dL  ALT  Result Value Ref Range   ALT 20 6 - 29 U/L  TSH  Result Value Ref Range   TSH 1.47 mIU/L  T4, free  Result Value Ref Range   Free T4 1.2 0.8 - 1.8 ng/dL  Beta 2 microglobulin, serum  Result Value Ref Range   Beta-2 Microglobulin 2.06 < OR = 2.5 mg/L  Cortisol  Result Value Ref Range   Cortisol, Plasma 16.1 mcg/dL  FSH/LH  Result Value Ref Range   FSH 50.2 mIU/mL   LH 27.1 mIU/mL      Assessment & Plan:   Problem List Items Addressed This Visit      Cardiovascular and Mediastinum   Benign hypertension (Chronic)    Avoid excess salt; rx for amlodipine; avoid/limit NSAIDs which can raise BP        Respiratory   Allergic rhinitis    Avoid decongestants        Other   Pain,  joint, multiple  sites    Mood improved now that pain level has improved      Obesity (Chronic)    Encouraged weight loss, which will likely help BP and pain level      Elevated C-reactive protein (CRP)    Seeing rheumatologist      Depression, major, recurrent, moderate (HCC)    Mood much improved today; continue regimen          Follow up plan: Return in about 3 months (around 05/16/2018) for fasting labs, then see Dr. Sanda Klein 3-5 days later.  An after-visit summary was printed and given to the patient at [place].  Please see the patient instructions which may contain other information and recommendations beyond what is mentioned above in the assessment and plan.  No orders of the defined types were placed in this encounter.   No orders of the defined types were placed in this encounter.

## 2018-02-15 LAB — PROTEIN,TOTAL AND ELECT AND IFE,24
Creatinine, 24H Ur: 1.01 g/(24.h) (ref 0.50–2.15)
PROTEIN/CREATININE RATIO: 91 mg/g creat (ref <=114)
Protein, 24H Urine: 92 mg/24 h (ref 0–149)

## 2018-02-17 ENCOUNTER — Encounter: Payer: Self-pay | Admitting: Family Medicine

## 2018-02-18 ENCOUNTER — Encounter: Payer: Self-pay | Admitting: Family Medicine

## 2018-02-18 DIAGNOSIS — N289 Disorder of kidney and ureter, unspecified: Secondary | ICD-10-CM

## 2018-02-18 DIAGNOSIS — N200 Calculus of kidney: Secondary | ICD-10-CM

## 2018-02-20 DIAGNOSIS — N200 Calculus of kidney: Secondary | ICD-10-CM | POA: Insufficient documentation

## 2018-02-20 DIAGNOSIS — N289 Disorder of kidney and ureter, unspecified: Secondary | ICD-10-CM | POA: Insufficient documentation

## 2018-02-21 ENCOUNTER — Ambulatory Visit (INDEPENDENT_AMBULATORY_CARE_PROVIDER_SITE_OTHER): Payer: Self-pay | Admitting: Female Pelvic Medicine and Reconstructive Surgery

## 2018-02-22 ENCOUNTER — Ambulatory Visit (INDEPENDENT_AMBULATORY_CARE_PROVIDER_SITE_OTHER): Payer: BC Managed Care – PPO | Admitting: Female Pelvic Medicine and Reconstructive Surgery

## 2018-02-22 ENCOUNTER — Encounter (INDEPENDENT_AMBULATORY_CARE_PROVIDER_SITE_OTHER): Payer: Self-pay | Admitting: Female Pelvic Medicine and Reconstructive Surgery

## 2018-02-22 VITALS — BP 130/80 | HR 84 | Ht 63.0 in | Wt 185.0 lb

## 2018-02-22 DIAGNOSIS — R311 Benign essential microscopic hematuria: Secondary | ICD-10-CM

## 2018-02-22 DIAGNOSIS — N398 Other specified disorders of urinary system: Secondary | ICD-10-CM

## 2018-02-22 DIAGNOSIS — M545 Low back pain: Secondary | ICD-10-CM

## 2018-02-22 DIAGNOSIS — T8384XA Pain from genitourinary prosthetic devices, implants and grafts, initial encounter: Secondary | ICD-10-CM

## 2018-02-22 DIAGNOSIS — G8929 Other chronic pain: Secondary | ICD-10-CM

## 2018-02-22 NOTE — Progress Notes (Signed)
Subjective:       Patient ID: Selena Wilson is a 52 y.o. female.  The patient is with c/o left sided pain which started right after sling placement.  The patient has complained of chronic pain in left leg, hip, vaginal area and tailbone.  She had a TVT type sling in 2003.  She has pain ever since.  She has urine leakage, trouble holding urine sometimes and is stopping it.  She has bladder and bowel spasms.  Constant feeling of needing to urinate.  Joint problems and pain.  She cannot walk very long or very well.  Constantly need to urinate.  She wants to mesh out of her body.  She complains of constant pain, left side.  Bladder spasms randomly with urinary leakage.  She has constant feeling of urinary urgency.  The current problem last all day and night.  She has some relief with his sleeping, lying down, worse with walking, sitting for more than 20 minutes, extending more than 10 minutes.  On pelvic floor distress inventory she has experience of pressure in the lower abdomen, somewhat feeling of heaviness, moderate feeling of incomplete bowel emptying, she has 2 bowel movements per day, but makes the symptoms worse are if she is constipated and she has had the current problem for the past 15 years.  She has moderate degree of incomplete bowel emptying.  And fecal incontinence.  She has quite a bit of feeling of urgency and having to rush to the bathroom to have bowel movements.  This has been going on for the past 4 years.  She has 8-10 episodes of urinary frequency, 6 times during the night.  She has 10 episodes of leakage per day and 6 times during the night.  She has quite a bit of feeling of urinary frequency and urge incontinence.  With a standing up.  She has poor flow, intermittent stream cautery, feeling of difficulty emptying her bladder, she has discomfort into her lower abdomen and her bladder.  She has had one urinary tract infection the past year.  The current issues have the debilitated are quite heavy  then has affected her quality of life.  Lasts time that the try to have sexual activity was a year ago.  Pt had mesh implant 15 yrs ago, c/o severe pain in the vagina area, L hip, and pelvic, leaks after using bathroom, hard time voiding, stool leakage.    [id number] UN 05/13/1610:10:28 [id number] [facility]) : CT UROGRAM      EXAM: CT abdomen with and without contrast with image post-processing, CT pelvis with contrast with image post-processing      INTERPRETATION LOCATION:[facility]      DICTATED: Wednesday, May 14, 2015.      CLINICAL INDICATION: 52 year-old F. R31.9 - Hematuria, OTHER   Pt was premedicated with a 13hr prep for this scan, Patient is allergic to Iodine. Will premedicate..      TECHNIQUE: 5-mm CT images of the abdomen were obtained without contrast, then with contrast in the nephrographic phase.Delayed post-contrast images through the abdomen and pelvis were obtained in the urographic phase, and coronal thin MIP reconstructions were obtained of the abdomen and pelvis.      For all [facility] CT exams, radiation dose reduction device (automated exposure control) is used or manual techniques with radiation dose As Low As Reasonably Achievable (ALARA) protocol are followed using age and patient-size-specific scan parameters, while maintaining the necessary diagnostic image quality.      COMPARISON: None.  FINDINGS: No renal or ureteral calculi are evident. No hydronephrosis is evident. No renal masses are present. The ureters are visualized along their entire course and appear unremarkable. The bladder appears unremarkable.      There is moderate fatty infiltration of the liver with mild hepatomegaly. The spleen and pancreas appear unremarkable. The gallbladder has been removed. No adrenal masses are present.      No bowel abnormalities are identified. No bowel obstruction is present.      No retroperitoneal, mesenteric, or pelvic adenopathy is present. The abdominal vessels  are patent.      Patient is status post hysterectomy. No pelvic masses are evident.      No worrisome osseous lesions are identified.       IMPRESSION:      --No abnormalities of the kidneys, ureters, or or bladder are identified.      --Moderate fatty infiltration of the liver with mild hepatomegaly.  HPI    Review of Systems   Genitourinary: Positive for difficulty urinating.   Musculoskeletal: Positive for arthralgias, back pain and gait problem.           Objective:    Physical Exam  Physical Exam   Nursing note and vitals reviewed.  Constitutional: She is oriented to person, place, and time. She appears well-developed and well-nourished.   HENT:   Head: Normocephalic and atraumatic.   Eyes: Pupils are equal, round, and reactive to light.   Neck: Normal range of motion.   Cardiovascular: Normal rate.    Pulmonary/Chest: Effort normal.   Abdominal: Soft. Bowel sounds are normal. Hernia confirmed negative in the right inguinal area and confirmed negative in the left inguinal area.   Musculoskeletal: Normal range of motion. Normal back, non-tender  Neurological: She is alert and oriented to person, place, and time. She has normal reflexes.   Skin: Skin is warm and dry.   Psychiatric: She has a normal mood and affect. Her behavior is normal. Judgment and thought content normal.     Pelvic floor examination (PFE):    yes Perineal Sensation Present   yes Anal Wink Reflex Present   yes Bulbospongiosus Reflex Present   yes Cough Reflex Present   yes Vulva Normal:   No Vulva / Labia tenderness  no Inner thigh tenderness:   yes Bartholin's Glands Normal   yes Urethra Normal   yes Q-Tip angle <90 degrees   no Normal Bladder Neck   no Stress incontinence demonstrated  no Bladder tenderness present   no Vagina Atrophic    yes Normal vaginal Capacity:     yes Normal vaginal Mobility:     yes Normal vaginal Vaginal Discharge:   yes Normal vaginal cuff  yes Normal Adnexal Exam:     Levator Contractions: 4/5  no  Involuntary pelvic floor contraction  no Involuntary pelvic floor relaxation  Levator tenderness:   no Right:   no Left:   Tender under the urethra and the bladder, PVR 30cc  PELVIC ORGAN PROLAPSE QUANTIFICATION (Valsalva)  -2 Aa  (anterior wall 3 cm from hymen)   (-3 to +3)      -3 Ba  (most dependent part of rest of anterior wall (-3 to TVL)   -7 C   (cervix or vaginal cuff)     (TVL)            -2 Ap (Posterior wall 3 cm from hymen)   (-3 to +3)      -3 Bp (most dependent part  of rest of posterior wall) (-3 to TVL)  2 GH (genital hiatus-midurethra to PB)   (no limit)  2 PB (Perineal body - PB to mid anus)   (no limit)       8 TVL (total vaginal length non straining)  (no limit)       no   Perinium Protrussion/Descent        Ultrasound procedure note:  After obtaining appropriate consent, the patient was placed in dorsal lithotomy position, and the following procedures were performed:  Indication:    7) Mesh    Perineal imaging: 2D Dynamic imaging of the bladder, urethra, rectum and any implanted mesh or visible vaginal cysts or masses were obtained in mid-sagittal view.  Any visible cystocele, rectocele, enterocele or intussusception was documented.   no Cystocele 1 - Least   no Rectocele 1 - Least   no Enterocele 1 - Least     Endovaginal 3D Imaging:  3D Endovaginal probe was used to obtain the views of the levator ani subdivisions, urethra, bladder, rectum and any implanted mesh.  Imaging was obtained from 6 cm cephalad to the hymen and higher as needed.  Valsalva maneuver was utilized to image for posterior compartment intussusception and the levator plate lift.  Any implanted mesh was examined with a q tip concurrently to ascertain the correlation with pain.  Urethral Length:43 mm  yes Bladder-Sling distance: 14.5  mm, Sling width: 7 mm  Posterior levator plate resting: 66.0 mm  Posterior levator plate squeeze: 63.0ZS      1 - Least RPA  1 - Least LPA  1 - Least RPR  1 - Least LPR  1 - Least RPC  1 - Least  LPC    no R Avulsion  no L Avulsion    MLH: 13.4 cm2  LPDA: -9 Degrees    Sling in place and clearly visualized as a TVT type.        Assessment:       1. Chronic left-sided low back pain, with sciatica presence unspecified    2. Voiding dysfunction    3. Benign essential microscopic hematuria    4. 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    I spent 60 minutes with this new patient, >50% spent on face to face counseling and care coordination.   Or    PFSH: I reviewed her history form which is either dictated and/or scanned into the system.    The ROS included at least 10 systems    The exam included at least 8 body systems    The medical decision making was highly complex and included a chronic illness with acute exacerbation posing a threat to life and bodily function and major surgery with risk factors OR drug therapy with monitoring or de-escalation of care.   We discussed with you that your sling appears to be in normal anatomic position and with coughing and Valsalva.  It is behaving rather normally.  Also you have are able to empty her bladder completely.  Although the QTip testing reveals there is pain at the sling.  There is also pain under the urethra and all the way to the base of the bladder.  The hypertonicity off the pelvic floor muscles.  As such, at this point, I am recommending for you  to receive pelvic floor therapy.  He can also receive a cystoscopy to look inside her bladder and make sure that there is no bladder injury due to the sling.  I am not opposed to removing the sling because of the pain in the area.  However, I cautioned that this may not resolve your autoimmune issue that you are mainly concerned about and also very may not resolve the hip pain and the back pain that she will have an it may predispose used to urinary incontinence.  Please think about  this and after you have received her physical therapy call us back if you wish to proceed.  Lastly, we stated that if you want to have all the sling, removed this would be a combined vaginal and robotic surgery with vaginal surgery we would do ultrasound guidance of sling localization the vaginal portion as much as possible and then we would do robotic dissection to remove the component from the back of the pubic bone to the periurethral area.

## 2018-02-22 NOTE — Patient Instructions (Signed)
We discussed with you that your sling appears to be in normal anatomic position and with coughing and Valsalva.  It is behaving rather normally.  Also you have are able to empty her bladder completely.  Although the QT testing reveals there is pain at the sling.  There is also pain under the urethra and all the way to the base of the bladder.  The hypertonicity off the pelvic floor muscles.  As such, at this point, I am recommending for you to receive pelvic floor therapy.  He can also receive a cystoscopy to look inside her bladder and make sure that there is no bladder injury due to the sling.  I am not opposed to removing the sling because of the pain in the area.  However, I cautioned that this may not resolve your autoimmune issue that you are mainly concerned about and also very may not resolve the hip pain and the back pain that she will have an it may predispose used to urinary incontinence.  Please think about this and after you have received her physical therapy call us back if you wish to proceed.  Lastly, we stated that if you want to have all the sling, removed this would be a combined vaginal and robotic surgery with vaginal surgery we would do ultrasound guidance of sling localization the vaginal portion as much as possible and then we would do robotic dissection to remove the component from the back of the pubic bone to the periurethral area.

## 2018-02-25 ENCOUNTER — Encounter (INDEPENDENT_AMBULATORY_CARE_PROVIDER_SITE_OTHER): Payer: Self-pay | Admitting: Female Pelvic Medicine and Reconstructive Surgery

## 2018-02-25 NOTE — Assessment & Plan Note (Signed)
Mood improved now that pain level has improved

## 2018-02-25 NOTE — Assessment & Plan Note (Signed)
Avoid decongestants 

## 2018-02-25 NOTE — Assessment & Plan Note (Signed)
Encouraged weight loss, which will likely help BP and pain level

## 2018-02-25 NOTE — Assessment & Plan Note (Signed)
Mood much improved today; continue regimen

## 2018-02-25 NOTE — Assessment & Plan Note (Signed)
Seeing rheumatologist.

## 2018-02-25 NOTE — Assessment & Plan Note (Addendum)
Avoid excess salt; rx for amlodipine; avoid/limit NSAIDs which can raise BP

## 2018-02-28 ENCOUNTER — Other Ambulatory Visit: Payer: Self-pay | Admitting: Family Medicine

## 2018-02-28 DIAGNOSIS — Z1231 Encounter for screening mammogram for malignant neoplasm of breast: Secondary | ICD-10-CM

## 2018-03-13 ENCOUNTER — Ambulatory Visit: Payer: BC Managed Care – PPO | Attending: Rheumatology

## 2018-03-13 ENCOUNTER — Other Ambulatory Visit: Payer: Self-pay

## 2018-03-13 DIAGNOSIS — M629 Disorder of muscle, unspecified: Secondary | ICD-10-CM | POA: Diagnosis present

## 2018-03-13 DIAGNOSIS — M62838 Other muscle spasm: Secondary | ICD-10-CM

## 2018-03-13 DIAGNOSIS — M6289 Other specified disorders of muscle: Secondary | ICD-10-CM

## 2018-03-13 DIAGNOSIS — M533 Sacrococcygeal disorders, not elsewhere classified: Secondary | ICD-10-CM

## 2018-03-13 DIAGNOSIS — M791 Myalgia, unspecified site: Secondary | ICD-10-CM | POA: Diagnosis present

## 2018-03-13 NOTE — Patient Instructions (Signed)
Stabilization: Diaphragmatic Breathing    Lie with knees bent, feet flat. Place one hand on stomach, other on chest. Breathe deeply through nose, lifting belly hand without any motion of hand on chest. Breathe in a "box" so you inhale 4 seconds, hold 4 seconds, exhale 4 seconds, hold 4 seconds. You can work up to doing this in reclining and sitting as it gets easier Getting In/out of bed     Lying on back, bend left knee and place left arm across chest. Roll all in one movement to the right. Reverse to roll to the left. Always move as one unit.     Once you are lying on you side, move legs to edge of bed. Pull in the pelvic floor and lower tummy and push down with both hands while moving legs off bed to reach sitting position.  *Reverse sequence to return to lying down.  Copyright  [employer]. All rights reserved.

## 2018-03-13 NOTE — Therapy (Signed)
[place] MAIN [facility] SERVICES [address] [place], [place], [address] Phone: [phone]   Fax:  [phone]  Physical Therapy Evaluation  Patient Details  Name: Yolanda Pace MRN: [id number] Date of Birth: 03-02-1966 Referring Provider: Tomasita Morrow   Encounter Date: 03/13/2018    Past Medical History:  Diagnosis Date  . Allergic rhinitis   . Anxiety   . Asthma   . Cystic thyroid nodule March 2015   20mm on u/s  . Depression   . Insomnia   . Obesity   . Right thyroid nodule 05/26/2017   Solid 1 cm RIGHT lobe July 2018  . Seizures [facility]) June 2015   provoked after cervical nerve block  . Skin cancer Aug 2015   removed from back  . Vitamin D deficiency disease     Past Surgical History:  Procedure Laterality Date  . ABDOMINAL HYSTERECTOMY  2005ish   fibroids, ovaries remain; along with bladder tack  . bladder tack  2005ish  . CHOLECYSTECTOMY    . SKIN CANCER EXCISION  Aug 2015   removed from back    There were no vitals filed for this visit.       [facility] PT Assessment - 03/14/18 0001      Assessment   Medical Diagnosis  Coccyodynia    Referring Provider  Jefm Bryant, Darnell Level    Onset Date/Surgical Date  06/13/02    Hand Dominance  Right    Next MD Visit  6-19    Prior Therapy  none      Precautions   Precautions  None      Restrictions   Weight Bearing Restrictions  No      Balance Screen   Has the patient fallen in the past 6 months  No      [place] residence    Available Help at Discharge  Family    Type of [place] to enter    Entrance Stairs-Number of Steps  [place]  One level      Prior Function   Level of Independence  Independent    Vocation  Full time employment    Psychologist, sport and exercise    Leisure  Walking, intercourse        Pelvic Floor Physical Therapy Evaluation and  Assessment  SCREENING  Falls in last 6 mo:no    Patient's communication preference:   Red Flags:  Have you had any night sweats? Yes, almost every night, [place] Unexplained weight loss? Had lost 11 lbs in 10 days right before she went to the ER in March. Saddle anesthesia? Tingles/burns on L in iner thigh/groin. Unexplained changes in bowel or bladder habits? yes  SUBJECTIVE  Patient reports: Feels that since she has started taking Sulindac her frequency has increased and her bladder pain is worse. It was helping with the joint pain at first but it is not anymore. Has gone to a chiropractor some and acupuncturist for pain with some help but not resolution and she has not been able to go back due to cost.   Has had to get up in the middle of the night ~ 3 times since bladder sling, last few months it increased to ~ 7 times and more recently it has been ~12 times per night and the bladder hurts constantly ,worse when laying  down, more when on her side, less on her back.   If she "overdoes it" she has worse pain in the tailbone and knees. She sometimes sleeps all weekend due to the pain.  * Pt. Gives verbal permission for communication with Dr. Eliberto Ivory, her urologist regarding her care.  Has had been told that she has RA by one MD and that she does not by another.  Had a bladder sling placed in 2003 due to urinary incontinence following childbirth that gradually worsened. Has had pain in her L leg, hip, ets ever since having the catheter removed.  Had injury in her L shoulder that was a distraction-style injury that   Precautions:  Had a skin cancer removed, no other personal history.   Social/Family/Vocational History:   Working full time as a Materials engineer  Recent Procedures/Tests/Findings:  Is having a cystoscopy on May 8th with Dr. Eliberto Ivory  currently has a L kidney cyst and "tiny" kidney stone on R side.  Obstetrical History: 3 vaginal deliveries, episiotomy with first  child  Gynecological History: Had hysterectomy d/t bleeding fibroids and had cysts as well.   Urinary History: Has incontinence with standing enough to wet a poise pad, uses 1-2 per day .   Gastrointestinal History: Has IBS but does not take medication for it. Fluctuates greatly and can have some fecal leakage a few minutes after having a BM.   Sexual activity/pain: Not having intercourse due to pain. Over the last 3 years has only had intercourse 1 time.  Location of pain: Tailbone  Current pain:  7/10  Max pain:  9/10 Least pain:  6/10 Nature of pain: pins and needles, ache, numbness.   Patient Goals: Decrease pain, constipation, and urinary frequency so she can work, have intercourse, and participate in activities with her family.   OBJECTIVE  Posture/Observations:  Sitting: fidgets constantly due to discomfort, turns whole body rather than neck due to pain/stiffness. Standing:   Palpation/Segmental Motion/Joint Play: Deferred to next visit  Special tests:  Deferred to next visit   Range of Motion/Flexibilty: Deferred to next visit Spine: Hips:   Strength/MMT: Deferred to next visit LE MMT  LE MMT Left Right  Hip flex:  (L2) /5 /5  Hip ext: /5 /5  Hip abd: /5 /5  Hip add: /5 /5  Hip IR /5 /5  Hip ER /5 /5     Abdominal: Deferred to next visit Palpation: Diastasis:  Pelvic Floor External Exam: Deferred to next visit Introitus Appears:  Skin integrity:  Palpation: Cough: Prolapse visible?: Scar mobility:  Internal Vaginal Exam: Deferred to next visit Strength (PERF):  Symmetry: Palpation: Prolapse:  Gait Analysis: Deferred to next visit   Pelvic Floor Outcome Measures: Female NIH-CPSI: 35/43 (81%), PDI:63/70  INTERVENTIONS THIS SESSION:   Self-care:  Educated on the structure and function of the pelvic floor in relation to their symptoms as well as the POC, and initial HEP in order to set patient expectations and understanding from which  we will build on in the future sessions. Educated on pain science model and how this will be integrated into her care due to the chronicity of her pain.   Total time: 70 min.          Objective measurements completed on examination: See above findings.                PT Short Term Goals - 03/14/18 1834      PT SHORT TERM GOAL #1   Title  Pt will be able to Walk for 10 blocks without increase in pain to demonstrate improved activity tolerance.    Baseline  1 block    Time  6    Period  Weeks    Status  New    Target Date  04/25/18      PT SHORT TERM GOAL #2   Title  Pt. will be able to stand for 45 minutes without increased pain. to demonstrate improved function     Baseline  15 minutes    Time  6    Period  Weeks    Status  New    Target Date  04/25/18      PT SHORT TERM GOAL #3   Title  Pt. will get up no more than 6 times per night to go to the bathroom to improve quality of sleep and allow for healing and down-regulation of pain.    Time  6    Period  Weeks    Status  New    Target Date  04/25/18      PT SHORT TERM GOAL #4   Title  Patient will demonstrate improved pelvic alignment and balance of musculature surrounding the pelvis to facilitate decreased PFM spasms and decrease pelvic pain.    Time  6    Period  Weeks    Status  New    Target Date  04/25/18        PT Long Term Goals - 03/14/18 1839      PT LONG TERM GOAL #1   Title  Patient will score less than or equal to 20% on the Pain Disability Index to demonstrate a decrease in disability from moderate to minimal.     Baseline  63/70 (90%)     Time  12    Period  Weeks    Status  New    Target Date  06/06/18      PT LONG TERM GOAL #2   Title  Patient will score less than or equal to 20% on the Female NIH-CPSI to demonstrate a reduction in pain, urinary symptoms, and an improved quality of life.    Baseline  35/43 (81%)    Time  12    Period  Weeks    Status  New    Target Date   06/06/18      PT LONG TERM GOAL #3   Title  Patient will be able to work a full day without a pain increase of greater than 2 from baseline.    Baseline  By the end of the week pain is so bad that she comes home and goes straight to bed, sleeps most of the weekend.    Time  12    Period  Weeks    Status  New    Target Date  06/06/18      PT LONG TERM GOAL #4   Title  Patient will be able to participate in activities with her family such as going to the fair or out to dinner and participate fully wothout being limited by pain.    Baseline  chooses not to go or can only participate at ~ 25% with family outings due to increased pain with standing and walking.    Time  12    Period  Weeks    Status  New    Target Date  06/06/18      PT LONG TERM GOAL #5   Title  Patient will  report no pain with intercourse to demonstrate improved functional ability.    Baseline  has only had intercourse 1 time in past 3 years due to pain.    Time  12    Period  Weeks    Status  New    Target Date  06/06/18      Additional Long Term Goals   Additional Long Term Goals  Yes      PT LONG TERM GOAL #6   Title  Patient will report urinating 6-8 times per day  and 1 or less times at night over the course of the prior week to demonstrate decreased frequency.    Baseline  urinating ~ every 30-45 min. and ~12 times per night.    Time  12    Period  Weeks    Status  New    Target Date  06/06/18             Plan - 03/14/18 1955    Clinical Impression Statement  Pt. Is a 52 y/o female with cheif c/o pain that started following the placement of a bladder sling in 2003 and has gradually worsened since this time. Other related significant pelvic floor problems include nocturiax12, urinary frequency every 30-45 min. and constipation requiring medication and still poorly managed. She has a complicated medical history including history of fibroid cysts, hysterectomy, and bladder sling that has all likely  contributed to her current state of pain. Yellow flags for cancer noted include: Night sweats, weight loss, and tingling/burning on L inner thigh/groin. and new increased instance of nocturia. Clinical exam revealed poor posture and decreased mobility as well as a fearfull and painful movement patterns that along with her history are sufficient to determine that she will benefit from skilled physical therapy to address her movement restrictions, fasical restrictions, spasms and pelvic mal-alignment as well as to continue to assess for and address other musculoskeletal causes of her pain.     Clinical Presentation  Evolving    Clinical Presentation due to:  Complicated medcal history and high pain irritability    Clinical Decision Making  High    Rehab Potential  Fair    Clinical Impairments Affecting Rehab Potential  Chronic pain, athsma    PT Frequency  1x / week    PT Duration  12 weeks    PT Treatment/Interventions  ADLs/Self Care Home Management;Biofeedback;Aquatic Therapy;Electrical Stimulation;Moist Heat;Traction;Ultrasound;Balance training;Therapeutic exercise;Therapeutic activities;Functional mobility training;Neuromuscular re-education;Patient/family education;Scar mobilization;Manual techniques;Passive range of motion;Dry needling;Energy conservation;Taping    PT Next Visit Plan  Continue assessing hips/back and PFM and treat pelvic obliquity    PT Home Exercise Plan  diaphragmatic breathing and pain science, graded activity    Consulted and Agree with Plan of Care  Patient       Patient will benefit from skilled therapeutic intervention in order to improve the following deficits and impairments:  Abnormal gait, Increased fascial restricitons, Improper body mechanics, Pain, Cardiopulmonary status limiting activity, Decreased coordination, Decreased mobility, Decreased scar mobility, Increased muscle spasms, Postural dysfunction, Decreased activity tolerance, Decreased endurance, Decreased  range of motion, Decreased strength, Decreased balance, Difficulty walking, Impaired flexibility  Visit Diagnosis: Myalgia  Sacrococcygeal disorders, not elsewhere classified  Other muscle spasm  Muscular imbalance     Problem List Patient Active Problem List   Diagnosis Date Noted  . Kidney stone 02/20/2018  . Renal lesion 02/20/2018  . Benign hypertension 01/21/2018  . Pelvic pain 12/26/2017  . Depression, major, recurrent, moderate ([place]) 12/26/2017  . GERD (  gastroesophageal reflux disease) 11/09/2017  . 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 Elevated C-reactive protein (CRP) 07/11/2017  . Right thyroid nodule 05/26/2017  . Mild hypercholesterolemia 05/06/2017  . Medication monitoring encounter 05/06/2017  . Generalized osteoarthritis of hand 12/13/2016  . Impingement syndrome of shoulder region, left 11/29/2016  . Heartburn 11/16/2016  . Pain, joint, multiple sites 05/07/2016  . Preventative health care 10/18/2015  . Elevated blood pressure reading 10/18/2015  . Breast cancer screening 10/17/2015  . Asthma   . Anxiety   . Depression   . Allergic rhinitis   . Insomnia   . Obesity   . Vitamin D deficiency disease   . Cystic thyroid nodule 01/13/2014   Willa Rough DPT, ATC Willa Rough 03/14/2018, 8:28 PM  [place] MAIN [facility] SERVICES [address] [place], [place], [address] Phone: [phone]   Fax:  [phone]  Name: Yolanda Pace MRN: [id number] Date of Birth: April 25, 1966

## 2018-03-20 ENCOUNTER — Ambulatory Visit: Payer: Self-pay | Admitting: Gastroenterology

## 2018-03-24 ENCOUNTER — Encounter: Payer: Self-pay | Admitting: Gastroenterology

## 2018-03-28 NOTE — Telephone Encounter (Signed)
I called pt to schedule her colonoscopy and EGD and she would like to wait because she is having a cystectomy on June 18th and wants to see how it goes with the anesthesia.  Pt informed that a recall letter will be sent for end of June so she can give [place] a call to schedule those procedures providing everything goes well.

## 2018-03-28 NOTE — Telephone Encounter (Signed)
-----   Message from Virgel Manifold, MD sent at 03/22/2018 12:53 PM EDT ----- Jackelyn Poling, please go ahead and schedule the patient for her colonoscopy (for screening) and EGD (for bright blood per rectum, and heartburn).    We have obtained and reviewed her records, and her past reaction was to a local anesthetic, and the sedation for these procedures is different.  Therefore, we can proceed with scheduling.  Once you put her on the schedule, please ask the endoscopy unit staff, Trish or Kieth Brightly, to let anesthesia know, that patient had a local anesthetic toxicity to left stellate ganglion block in 2015.  Please tell them to have anesthesia review this record under Media and scanned papers from Mar 20, 2018.  From our standpoint, this should be okay, but we wanted to make them aware.  Thank you

## 2018-03-29 ENCOUNTER — Ambulatory Visit: Payer: BC Managed Care – PPO | Attending: Rheumatology

## 2018-03-29 DIAGNOSIS — M791 Myalgia, unspecified site: Secondary | ICD-10-CM | POA: Diagnosis present

## 2018-03-29 DIAGNOSIS — M533 Sacrococcygeal disorders, not elsewhere classified: Secondary | ICD-10-CM | POA: Diagnosis present

## 2018-03-29 DIAGNOSIS — M6289 Other specified disorders of muscle: Secondary | ICD-10-CM

## 2018-03-29 DIAGNOSIS — M629 Disorder of muscle, unspecified: Secondary | ICD-10-CM | POA: Insufficient documentation

## 2018-03-29 DIAGNOSIS — M62838 Other muscle spasm: Secondary | ICD-10-CM | POA: Diagnosis present

## 2018-03-29 NOTE — Therapy (Signed)
[place] MAIN [facility] SERVICES [address] [place], [place], [address] Phone: [phone]   Fax:  [phone]  Physical Therapy Treatment  Patient Details  Name: Yolanda Pace MRN: [id number] Date of Birth: 03-10-1966 Referring Provider: Tomasita Morrow   Encounter Date: 03/29/2018  PT End of Session - 03/29/18 2037    Visit Number  2    Number of Visits  12    Date for PT Re-Evaluation  06/06/18    PT Start Time  4259    PT Stop Time  1840    PT Time Calculation (min)  65 min    Activity Tolerance  Patient limited by pain    Behavior During Therapy  [facility] for tasks assessed/performed       Past Medical History:  Diagnosis Date  . Allergic rhinitis   . Anxiety   . Asthma   . Cystic thyroid nodule March 2015   5mm on u/s  . Depression   . Insomnia   . Obesity   . Right thyroid nodule 05/26/2017   Solid 1 cm RIGHT lobe July 2018  . Seizures [facility]) June 2015   provoked after cervical nerve block  . Skin cancer Aug 2015   removed from back  . Vitamin D deficiency disease     Past Surgical History:  Procedure Laterality Date  . ABDOMINAL HYSTERECTOMY  2005ish   fibroids, ovaries remain; along with bladder tack  . bladder tack  2005ish  . CHOLECYSTECTOMY    . SKIN CANCER EXCISION  Aug 2015   removed from back    There were no vitals filed for this visit.    Pelvic Floor Physical Therapy Treatment Note  SCREENING  Changes in medications, allergies, or medical history?: no   SUBJECTIVE  Patient reports: She is in a lot of pain today, pain has gradually increased since Saturday. It is worst today.    Pain update:  Location of pain: tailbone, LB, L leg and knees, L heel. Current pain:  8/10  Max pain:  8/10 Least pain:  6/10 Nature of pain: achy, burning/vibrating pain.   Patient Goals: Decrease pain, constipation, and urinary frequency so she can work, have intercourse, and participate in activities with her  family.    OBJECTIVE  Changes in:  Pelvic floor: External Exam:  Introitus Appears: normal Skin integrity: redness Palpation: TTP through R STPand IC, through all on L Cough: Paradoxical Prolapse visible?: no Scar mobility: not assessed  Internal Vaginal Exam: Deferred to next visit Strength (PERF): 2+/5, 3 seconds Symmetry: L>R for tenderness Palpation: TTP through all musculature Prolapse: none noted.   Palpation: TTP through L QL, Obliques, and greatest through Piriformis.  Gait Analysis: antalgic  INTERVENTIONS THIS SESSION: Manual: Assessed PFM for further POC development and performed TP release with >75% reduction of pain in the L IC internally to decrease pain and spasm as well as to educate Pt. On how TP release works. Performed TP release to L obliques, QL, and Piriformis to decrease tension on nerves that innervate the pelvis and lower extremity. Followed with ice due to high sensitivity. Therex: Educated on and practiced piriformis stretch and educated on general principle of stretching and gentle movement.  Total time: 65 min.                         PT Education - 03/29/18 2037    Education provided  Yes    Education Details  See Pt. Instructions and Interventions this session    Person(s) Educated  Patient    Methods  Explanation;Demonstration;Handout;Verbal cues    Comprehension  Verbalized understanding;Returned demonstration       PT Short Term Goals - 03/14/18 1834      PT SHORT TERM GOAL #1   Title  Pt will be able to Walk for 10 blocks without increase in pain to demonstrate improved activity tolerance.    Baseline  1 block    Time  6    Period  Weeks    Status  New    Target Date  04/25/18      PT SHORT TERM GOAL #2   Title  Pt. will be able to stand for 45 minutes without increased pain. to demonstrate improved function     Baseline  15 minutes    Time  6    Period  Weeks    Status  New    Target Date  04/25/18       PT SHORT TERM GOAL #3   Title  Pt. will get up no more than 6 times per night to go to the bathroom to improve quality of sleep and allow for healing and down-regulation of pain.    Time  6    Period  Weeks    Status  New    Target Date  04/25/18      PT SHORT TERM GOAL #4   Title  Patient will demonstrate improved pelvic alignment and balance of musculature surrounding the pelvis to facilitate decreased PFM spasms and decrease pelvic pain.    Time  6    Period  Weeks    Status  New    Target Date  04/25/18        PT Long Term Goals - 03/14/18 1839      PT LONG TERM GOAL #1   Title  Patient will score less than or equal to 20% on the Pain Disability Index to demonstrate a decrease in disability from moderate to minimal.     Baseline  63/70 (90%)     Time  12    Period  Weeks    Status  New    Target Date  06/06/18      PT LONG TERM GOAL #2   Title  Patient will score less than or equal to 20% on the Female NIH-CPSI to demonstrate a reduction in pain, urinary symptoms, and an improved quality of life.    Baseline  35/43 (81%)    Time  12    Period  Weeks    Status  New    Target Date  06/06/18      PT LONG TERM GOAL #3   Title  Patient will be able to work a full day without a pain increase of greater than 2 from baseline.    Baseline  By the end of the week pain is so bad that she comes home and goes straight to bed, sleeps most of the weekend.    Time  12    Period  Weeks    Status  New    Target Date  06/06/18      PT LONG TERM GOAL #4   Title  Patient will be able to participate in activities with her family such as going to the fair or out to dinner and participate fully wothout being limited by pain.    Baseline  chooses not to go or can only  participate at ~ 25% with family outings due to increased pain with standing and walking.    Time  12    Period  Weeks    Status  New    Target Date  06/06/18      PT LONG TERM GOAL #5   Title  Patient will report  no pain with intercourse to demonstrate improved functional ability.    Baseline  has only had intercourse 1 time in past 3 years due to pain.    Time  12    Period  Weeks    Status  New    Target Date  06/06/18      Additional Long Term Goals   Additional Long Term Goals  Yes      PT LONG TERM GOAL #6   Title  Patient will report urinating 6-8 times per day  and 1 or less times at night over the course of the prior week to demonstrate decreased frequency.    Baseline  urinating ~ every 30-45 min. and ~12 times per night.    Time  12    Period  Weeks    Status  New    Target Date  06/06/18            Plan - 03/29/18 2039    Clinical Impression Statement  Pt. Is in greater pain today, describes a "flare-up" that started on Saturday and has worsened since. She demonstrated understanding of all education provided and tolerated treatment well considering high pain levels. Continue per POC.    Clinical Presentation  Evolving    Clinical Decision Making  High    Rehab Potential  Fair    Clinical Impairments Affecting Rehab Potential  Chronic pain, athsma    PT Frequency  1x / week    PT Duration  12 weeks    PT Treatment/Interventions  ADLs/Self Care Home Management;Biofeedback;Aquatic Therapy;Electrical Stimulation;Moist Heat;Traction;Ultrasound;Balance training;Therapeutic exercise;Therapeutic activities;Functional mobility training;Neuromuscular re-education;Patient/family education;Scar mobilization;Manual techniques;Passive range of motion;Dry needling;Energy conservation;Taping    PT Next Visit Plan  Continue assessing hips/back and PFM and treat pelvic obliquity    PT Home Exercise Plan  diaphragmatic breathing and pain science, graded activity    Consulted and Agree with Plan of Care  Patient       Patient will benefit from skilled therapeutic intervention in order to improve the following deficits and impairments:  Abnormal gait, Increased fascial restricitons, Improper  body mechanics, Pain, Cardiopulmonary status limiting activity, Decreased coordination, Decreased mobility, Decreased scar mobility, Increased muscle spasms, Postural dysfunction, Decreased activity tolerance, Decreased endurance, Decreased range of motion, Decreased strength, Decreased balance, Difficulty walking, Impaired flexibility  Visit Diagnosis: Myalgia  Sacrococcygeal disorders, not elsewhere classified  Other muscle spasm  Muscular imbalance     Problem List Patient Active Problem List   Diagnosis Date Noted  . Kidney stone 02/20/2018  . Renal lesion 02/20/2018  . Benign hypertension 01/21/2018  . Pelvic pain 12/26/2017  . Depression, major, recurrent, moderate ([place]) 12/26/2017  . GERD (gastroesophageal reflux disease) 11/09/2017  . 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 Elevated C-reactive protein (CRP) 07/11/2017  . Right thyroid nodule 05/26/2017  . Mild hypercholesterolemia 05/06/2017  . Medication monitoring encounter 05/06/2017  . Generalized osteoarthritis of hand 12/13/2016  . Impingement syndrome of shoulder region, left 11/29/2016  . Heartburn 11/16/2016  . Pain, joint, multiple sites 05/07/2016  . Preventative health care 10/18/2015  . Elevated blood pressure reading 10/18/2015  . Breast cancer screening 10/17/2015  . Asthma   .  Anxiety   . Depression   . Allergic rhinitis   . Insomnia   . Obesity   . Vitamin D deficiency disease   . Cystic thyroid nodule 01/13/2014   Willa Rough DPT, ATC Willa Rough 03/29/2018, 8:46 PM  [place] MAIN [facility] SERVICES [address] [place], [place], [address] Phone: [phone]   Fax:  [phone]  Name: Yolanda Pace MRN: [id number] Date of Birth: 08-Jul-1966

## 2018-03-29 NOTE — Patient Instructions (Signed)
  Hold stretch for 30 seconds, (~ 5 breaths) repeat 2-3 times on each side.

## 2018-04-05 ENCOUNTER — Ambulatory Visit: Payer: BC Managed Care – PPO

## 2018-04-05 DIAGNOSIS — M533 Sacrococcygeal disorders, not elsewhere classified: Secondary | ICD-10-CM

## 2018-04-05 DIAGNOSIS — M791 Myalgia, unspecified site: Secondary | ICD-10-CM | POA: Diagnosis not present

## 2018-04-05 DIAGNOSIS — M629 Disorder of muscle, unspecified: Secondary | ICD-10-CM

## 2018-04-05 DIAGNOSIS — M62838 Other muscle spasm: Secondary | ICD-10-CM

## 2018-04-05 DIAGNOSIS — M6289 Other specified disorders of muscle: Secondary | ICD-10-CM

## 2018-04-05 NOTE — Therapy (Signed)
[place] MAIN [facility] SERVICES [address] [place], [place], [address] Phone: [phone]   Fax:  [phone]  Physical Therapy Treatment  Patient Details  Name: Yolanda Pace MRN: [id number] Date of Birth: [birth year] Referring Provider: Tomasita Morrow   Encounter Date: 04/05/2018  PT End of Session - 04/06/18 2314    Visit Number  3    Number of Visits  12    Date for PT Re-Evaluation  06/06/18    PT Start Time  8850    PT Stop Time  1733    PT Time Calculation (min)  58 min    Activity Tolerance  Patient limited by pain    Behavior During Therapy  [facility] for tasks assessed/performed       Past Medical History:  Diagnosis Date  . Allergic rhinitis   . Anxiety   . Asthma   . Cystic thyroid nodule March 2015   57mm on u/s  . Depression   . Insomnia   . Obesity   . Right thyroid nodule 05/26/2017   Solid 1 cm RIGHT lobe July 2018  . Seizures [facility]) June 2015   provoked after cervical nerve block  . Skin cancer Aug 2015   removed from back  . Vitamin D deficiency disease     Past Surgical History:  Procedure Laterality Date  . ABDOMINAL HYSTERECTOMY  2005ish   fibroids, ovaries remain; along with bladder tack  . bladder tack  2005ish  . CHOLECYSTECTOMY    . SKIN CANCER EXCISION  Aug 2015   removed from back    There were no vitals filed for this visit.    Pelvic Floor Physical Therapy Treatment Note  SCREENING  Changes in medications, allergies, or medical history?: Has started taking Sprix (ketorolac tromethamine) for pain.     SUBJECTIVE  Patient reports: She was feeling bad all weekend following last session but has felt better on Monday and Tuesday due to combination of Sprix and tramadol.   Pain update:  Location of pain: tailbone, LB, L leg and knees, L heel. Current pain: 6/10  Max pain: 8/10 Least pain: 3/10 Nature of pain:achy, burning/vibrating pain.   Patient Goals: Decrease pain, constipation,  and urinary frequency so she can work, have intercourse, and participate in activities with her family.     OBJECTIVE  Changes in: Posture/Observations:  R anterior rotation/L posterior rotation. R leg 86 cm, L is 85 cm.  Special tests:  Stork:Positive for decreased sacral mobility B  Range of Motion/Flexibilty:  Fingers ~ 3 inches from knee B with SB, Pain on R with B SB. ~ 75% restricted with B rotation, Pain in L glute/hip with R rotation>L rotation.   Palpation: Exquisitely TTP through L glute max, min, med and TFL. Decreased sensitivity following light pressure MFR and STM.  Gait Analysis: antalgic  INTERVENTIONS THIS SESSION: Manual: Assessed spine ROM and sacral stability/mobility for further POC development and Performed MFR and light STM around B SIJ, sacral borders, and through L glutes and lateral thigh.  NM re-ed: educated Pt. On performing self liht massage to decrease sensitivity of the structures and decrease pain via gate theory input. Educated on the potential to use hydrostatic pressure and TENS for this application as well.   Total time: 58 min.                        PT Education - 04/06/18 2313    Education provided  Yes    Education Details  See Interventions this session    Person(s) Educated  Patient    Methods  Explanation;Demonstration;Verbal cues    Comprehension  Verbalized understanding;Returned demonstration       PT Short Term Goals - 03/14/18 1834      PT SHORT TERM GOAL #1   Title  Pt will be able to Walk for 10 blocks without increase in pain to demonstrate improved activity tolerance.    Baseline  1 block    Time  6    Period  Weeks    Status  New    Target Date  04/25/18      PT SHORT TERM GOAL #2   Title  Pt. will be able to stand for 45 minutes without increased pain. to demonstrate improved function     Baseline  15 minutes    Time  6    Period  Weeks    Status  New    Target Date  04/25/18      PT  SHORT TERM GOAL #3   Title  Pt. will get up no more than 6 times per night to go to the bathroom to improve quality of sleep and allow for healing and down-regulation of pain.    Time  6    Period  Weeks    Status  New    Target Date  04/25/18      PT SHORT TERM GOAL #4   Title  Patient will demonstrate improved pelvic alignment and balance of musculature surrounding the pelvis to facilitate decreased PFM spasms and decrease pelvic pain.    Time  6    Period  Weeks    Status  New    Target Date  04/25/18        PT Long Term Goals - 03/14/18 1839      PT LONG TERM GOAL #1   Title  Patient will score less than or equal to 20% on the Pain Disability Index to demonstrate a decrease in disability from moderate to minimal.     Baseline  63/70 (90%)     Time  12    Period  Weeks    Status  New    Target Date  06/06/18      PT LONG TERM GOAL #2   Title  Patient will score less than or equal to 20% on the Female NIH-CPSI to demonstrate a reduction in pain, urinary symptoms, and an improved quality of life.    Baseline  35/43 (81%)    Time  12    Period  Weeks    Status  New    Target Date  06/06/18      PT LONG TERM GOAL #3   Title  Patient will be able to work a full day without a pain increase of greater than 2 from baseline.    Baseline  By the end of the week pain is so bad that she comes home and goes straight to bed, sleeps most of the weekend.    Time  12    Period  Weeks    Status  New    Target Date  06/06/18      PT LONG TERM GOAL #4   Title  Patient will be able to participate in activities with her family such as going to the fair or out to dinner and participate fully wothout being limited by pain.    Baseline  chooses not to  go or can only participate at ~ 25% with family outings due to increased pain with standing and walking.    Time  12    Period  Weeks    Status  New    Target Date  06/06/18      PT LONG TERM GOAL #5   Title  Patient will report no pain  with intercourse to demonstrate improved functional ability.    Baseline  has only had intercourse 1 time in past 3 years due to pain.    Time  12    Period  Weeks    Status  New    Target Date  06/06/18      Additional Long Term Goals   Additional Long Term Goals  Yes      PT LONG TERM GOAL #6   Title  Patient will report urinating 6-8 times per day  and 1 or less times at night over the course of the prior week to demonstrate decreased frequency.    Baseline  urinating ~ every 30-45 min. and ~12 times per night.    Time  12    Period  Weeks    Status  New    Target Date  06/06/18            Plan - 04/06/18 2315    Clinical Impression Statement  Pt. Demonstrates decreased pain today and responded well to all interventions, demonstrating understanding of all education provided and decreased sensitivity to pressure through L hip and LB following manual treatment. Continue er POC.    Clinical Presentation  Evolving    Clinical Decision Making  High    Rehab Potential  Fair    Clinical Impairments Affecting Rehab Potential  Chronic pain, athsma    PT Frequency  1x / week    PT Duration  12 weeks    PT Treatment/Interventions  ADLs/Self Care Home Management;Biofeedback;Aquatic Therapy;Electrical Stimulation;Moist Heat;Traction;Ultrasound;Balance training;Therapeutic exercise;Therapeutic activities;Functional mobility training;Neuromuscular re-education;Patient/family education;Scar mobilization;Manual techniques;Passive range of motion;Dry needling;Energy conservation;Taping    PT Next Visit Plan  Continue assessing hips/back and PFM and treat pelvic obliquity, discuss hypothesis further, show article and treat fascia around anterior L hip near ASIS.    PT Home Exercise Plan  diaphragmatic breathing and pain science, graded activity, self light massage to decrease sensitivity.    Consulted and Agree with Plan of Care  Patient       Patient will benefit from skilled therapeutic  intervention in order to improve the following deficits and impairments:  Abnormal gait, Increased fascial restricitons, Improper body mechanics, Pain, Cardiopulmonary status limiting activity, Decreased coordination, Decreased mobility, Decreased scar mobility, Increased muscle spasms, Postural dysfunction, Decreased activity tolerance, Decreased endurance, Decreased range of motion, Decreased strength, Decreased balance, Difficulty walking, Impaired flexibility  Visit Diagnosis: Myalgia  Sacrococcygeal disorders, not elsewhere classified  Other muscle spasm  Muscular imbalance     Problem List Patient Active Problem List   Diagnosis Date Noted  . Kidney stone 02/20/2018  . Renal lesion 02/20/2018  . Benign hypertension 01/21/2018  . Pelvic pain 12/26/2017  . Depression, major, recurrent, moderate ([place]) 12/26/2017  . GERD (gastroesophageal reflux disease) 11/09/2017  . 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 Elevated C-reactive protein (CRP) 07/11/2017  . Right thyroid nodule 05/26/2017  . Mild hypercholesterolemia 05/06/2017  . Medication monitoring encounter 05/06/2017  . Generalized osteoarthritis of hand 12/13/2016  . Impingement syndrome of shoulder region, left 11/29/2016  . Heartburn 11/16/2016  . Pain, joint, multiple sites 05/07/2016  .  Preventative health care 10/18/2015  . Elevated blood pressure reading 10/18/2015  . Breast cancer screening 10/17/2015  . Asthma   . Anxiety   . Depression   . Allergic rhinitis   . Insomnia   . Obesity   . Vitamin D deficiency disease   . Cystic thyroid nodule 01/13/2014   Willa Rough DPT, ATC Willa Rough 04/06/2018, 11:20 PM  [place] MAIN [facility] SERVICES [address] [place], [place], [address] Phone: [phone]   Fax:  [phone]  Name: Yolanda Pace MRN: [id number] Date of Birth: May 08, 1966

## 2018-04-06 ENCOUNTER — Encounter: Payer: Self-pay | Admitting: Family Medicine

## 2018-04-06 ENCOUNTER — Other Ambulatory Visit: Payer: Self-pay | Admitting: Family Medicine

## 2018-04-06 NOTE — Telephone Encounter (Signed)
Request received for clonazepam and tramadol Please ask patient to have the rheumatologist manage her pain going forward I advise that she NOT take clonazepam going forward; we'll be glad to get her in to see a counselor if needed (REFER if needed)

## 2018-04-06 NOTE — Telephone Encounter (Signed)
Left detailed voicemail

## 2018-04-12 ENCOUNTER — Ambulatory Visit: Payer: BC Managed Care – PPO

## 2018-04-12 DIAGNOSIS — M629 Disorder of muscle, unspecified: Secondary | ICD-10-CM

## 2018-04-12 DIAGNOSIS — M791 Myalgia, unspecified site: Secondary | ICD-10-CM

## 2018-04-12 DIAGNOSIS — M62838 Other muscle spasm: Secondary | ICD-10-CM

## 2018-04-12 DIAGNOSIS — M533 Sacrococcygeal disorders, not elsewhere classified: Secondary | ICD-10-CM

## 2018-04-12 DIAGNOSIS — M6289 Other specified disorders of muscle: Secondary | ICD-10-CM

## 2018-04-12 NOTE — Therapy (Signed)
[place] MAIN [facility] SERVICES [address] [place], [place], [address] Phone: [phone]   Fax:  [phone]  Physical Therapy Treatment  Patient Details  Name: Yolanda Pace MRN: [id number] Date of Birth: 1966-05-17 Referring Provider: Tomasita Morrow   Encounter Date: 04/12/2018  PT End of Session - 04/12/18 1750    Visit Number  4    Number of Visits  12    Date for PT Re-Evaluation  06/06/18    PT Start Time  6962    PT Stop Time  1634    PT Time Calculation (min)  60 min    Activity Tolerance  Patient limited by pain    Behavior During Therapy  [facility] for tasks assessed/performed       Past Medical History:  Diagnosis Date  . Allergic rhinitis   . Anxiety   . Asthma   . Cystic thyroid nodule March 2015   57mm on u/s  . Depression   . Insomnia   . Obesity   . Right thyroid nodule 05/26/2017   Solid 1 cm RIGHT lobe July 2018  . Seizures [facility]) June 2015   provoked after cervical nerve block  . Skin cancer Aug 2015   removed from back  . Vitamin D deficiency disease     Past Surgical History:  Procedure Laterality Date  . ABDOMINAL HYSTERECTOMY  2005ish   fibroids, ovaries remain; along with bladder tack  . bladder tack  2005ish  . CHOLECYSTECTOMY    . SKIN CANCER EXCISION  Aug 2015   removed from back    There were no vitals filed for this visit.  Pelvic Floor Physical Therapy Treatment Note  SCREENING  Changes in medications, allergies, or medical history?: no   SUBJECTIVE  Patient reports: She was in a lot of pain following last session, would have taken a day off of work if she could have. 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   Precautions:  Fibromyalgia, skin cancer  Pain update:   Location of pain:tailbone, LB, L leg and  knees, L heel. Current pain:6/10  Max pain:10/10 Least pain:3/10 Nature of pain:achy, burning/vibrating pain.   Patient Goals: Decrease pain, constipation, and urinary frequency so she can work, have intercourse, and participate in activities with her family.    OBJECTIVE  Changes in:  Palpation: Extremely sensitive to pressure through L>R neck and shoulders  Gait Analysis: antalgic   INTERVENTIONS THIS SESSION: Self-care: educated Pt. On hypothesis of Meralgia Paresthetica and potential etiology leading to chronic pain over time in combination with other insults to her body via surgery and shoulder injury. Given article to review. Educated Pt. On how to operate TENS unit and the function of using it to control pain. Trial use in the clinic while manual therapy was applied. Educated on gentle ROM of neck and shoulders to counteract fear-avoidance and decrease pain. Manual: Performed very gentle light STM to B cervical extensors, trapezius, deltoids and into the upper arm and gentle (grade 1) passive mobilization along the neck as well as through the shoulders to encourage improved mobility and decrease sensitivity and pain. Performed cervical distraction and STM to B temporals and forehead to decrease headache that was a result of grade 1 MOBS to shoulders.  Total time: 60 min.                            PT Education - 04/12/18 1749    Education provided  Yes    Education Details  See Interventions this session    Person(s) Educated  Patient    Methods  Explanation;Demonstration;Verbal cues    Comprehension  Verbalized understanding;Returned demonstration;Verbal cues required       PT Short Term Goals - 03/14/18 1834      PT SHORT TERM GOAL #1   Title  Pt will be able to Walk for 10 blocks without increase in pain to demonstrate improved activity tolerance.    Baseline  1 block    Time  6    Period  Weeks    Status  New    Target Date   04/25/18      PT SHORT TERM GOAL #2   Title  Pt. will be able to stand for 45 minutes without increased pain. to demonstrate improved function     Baseline  15 minutes    Time  6    Period  Weeks    Status  New    Target Date  04/25/18      PT SHORT TERM GOAL #3   Title  Pt. will get up no more than 6 times per night to go to the bathroom to improve quality of sleep and allow for healing and down-regulation of pain.    Time  6    Period  Weeks    Status  New    Target Date  04/25/18      PT SHORT TERM GOAL #4   Title  Patient will demonstrate improved pelvic alignment and balance of musculature surrounding the pelvis to facilitate decreased PFM spasms and decrease pelvic pain.    Time  6    Period  Weeks    Status  New    Target Date  04/25/18        PT Long Term Goals - 03/14/18 1839      PT LONG TERM GOAL #1   Title  Patient will score less than or equal to 20% on the Pain Disability Index to demonstrate a decrease in disability from moderate to minimal.     Baseline  63/70 (90%)     Time  12    Period  Weeks    Status  New    Target Date  06/06/18      PT LONG TERM GOAL #2   Title  Patient will score less than or equal to 20% on the Female NIH-CPSI to demonstrate a reduction in pain, urinary symptoms, and an improved quality of life.    Baseline  35/43 (81%)    Time  12    Period  Weeks    Status  New    Target Date  06/06/18      PT LONG TERM GOAL #3   Title  Patient will be able to work a full day without a pain increase of greater than 2 from baseline.    Baseline  By the end of the week pain is so bad that she comes home and goes straight to bed, sleeps most of the weekend.    Time  12    Period  Weeks    Status  New    Target Date  06/06/18      PT LONG  TERM GOAL #4   Title  Patient will be able to participate in activities with her family such as going to the fair or out to dinner and participate fully wothout being limited by pain.    Baseline  chooses  not to go or can only participate at ~ 25% with family outings due to increased pain with standing and walking.    Time  12    Period  Weeks    Status  New    Target Date  06/06/18      PT LONG TERM GOAL #5   Title  Patient will report no pain with intercourse to demonstrate improved functional ability.    Baseline  has only had intercourse 1 time in past 3 years due to pain.    Time  12    Period  Weeks    Status  New    Target Date  06/06/18      Additional Long Term Goals   Additional Long Term Goals  Yes      PT LONG TERM GOAL #6   Title  Patient will report urinating 6-8 times per day  and 1 or less times at night over the course of the prior week to demonstrate decreased frequency.    Baseline  urinating ~ every 30-45 min. and ~12 times per night.    Time  12    Period  Weeks    Status  New    Target Date  06/06/18            Plan - 04/12/18 1750    Clinical Impression Statement  Pts pain is extermely irritable but she was able to tolerate tens while manual treatment was performed to neck and shoulders with moderate increase in head and hip pain which resolved with positioning. Patient will trial TENS unit and purchase a home unit if helpful for pain management. Physician contacted about potential for medical/surgical management of Meralgia Paresthetica. Continue per POC.    Clinical Presentation  Evolving    Clinical Decision Making  High    Rehab Potential  Fair    Clinical Impairments Affecting Rehab Potential  Chronic pain, athsma    PT Frequency  1x / week    PT Duration  12 weeks    PT Treatment/Interventions  ADLs/Self Care Home Management;Biofeedback;Aquatic Therapy;Electrical Stimulation;Moist Heat;Traction;Ultrasound;Balance training;Therapeutic exercise;Therapeutic activities;Functional mobility training;Neuromuscular re-education;Patient/family education;Scar mobilization;Manual techniques;Passive range of motion;Dry needling;Energy conservation;Taping    PT  Next Visit Plan  Continue assessing hips/back and PFM and treat pelvic obliquity, pulsed ultrasound for inflammation and pain reduction near ASIS?    PT Home Exercise Plan  diaphragmatic breathing and pain science, graded activity, self light massage to decrease sensitivity.    Consulted and Agree with Plan of Care  Patient       Patient will benefit from skilled therapeutic intervention in order to improve the following deficits and impairments:  Abnormal gait, Increased fascial restricitons, Improper body mechanics, Pain, Cardiopulmonary status limiting activity, Decreased coordination, Decreased mobility, Decreased scar mobility, Increased muscle spasms, Postural dysfunction, Decreased activity tolerance, Decreased endurance, Decreased range of motion, Decreased strength, Decreased balance, Difficulty walking, Impaired flexibility  Visit Diagnosis: Myalgia  Sacrococcygeal disorders, not elsewhere classified  Other muscle spasm  Muscular imbalance     Problem List Patient Active Problem List   Diagnosis Date Noted  . Kidney stone 02/20/2018  . Renal lesion 02/20/2018  . Benign hypertension 01/21/2018  . Pelvic pain 12/26/2017  . Depression, major, recurrent, moderate (  [place]) 12/26/2017  . GERD (gastroesophageal reflux disease) 11/09/2017  . 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 Elevated C-reactive protein (CRP) 07/11/2017  . Right thyroid nodule 05/26/2017  . Mild hypercholesterolemia 05/06/2017  . Medication monitoring encounter 05/06/2017  . Generalized osteoarthritis of hand 12/13/2016  . Impingement syndrome of shoulder region, left 11/29/2016  . Heartburn 11/16/2016  . Pain, joint, multiple sites 05/07/2016  . Preventative health care 10/18/2015  . Elevated blood pressure reading 10/18/2015  . Breast cancer screening 10/17/2015  . Asthma   . Anxiety   . Depression   . Allergic rhinitis   . Insomnia   . Obesity   . Vitamin D deficiency disease   . Cystic  thyroid nodule 01/13/2014   Willa Rough DPT, ATC Willa Rough 04/12/2018, 5:56 PM  [place] MAIN [facility] SERVICES [address] [place], [place], [address] Phone: [phone]   Fax:  [phone]  Name: Yolanda Pace MRN: [id number] Date of Birth: 01-29-1966

## 2018-04-13 ENCOUNTER — Ambulatory Visit: Payer: Self-pay

## 2018-04-13 ENCOUNTER — Other Ambulatory Visit: Payer: Self-pay | Admitting: Occupational Medicine

## 2018-04-13 DIAGNOSIS — M25562 Pain in left knee: Secondary | ICD-10-CM

## 2018-04-13 DIAGNOSIS — M25512 Pain in left shoulder: Secondary | ICD-10-CM

## 2018-04-15 DIAGNOSIS — M2550 Pain in unspecified joint: Secondary | ICD-10-CM

## 2018-04-15 HISTORY — DX: Pain in unspecified joint: M25.50

## 2018-04-19 ENCOUNTER — Ambulatory Visit: Payer: BC Managed Care – PPO

## 2018-04-25 ENCOUNTER — Other Ambulatory Visit: Payer: Self-pay

## 2018-04-25 ENCOUNTER — Encounter
Admission: RE | Admit: 2018-04-25 | Discharge: 2018-04-25 | Disposition: A | Discharge status: Home / Self Care | Payer: BC Managed Care – PPO | Source: Ambulatory Visit | Attending: Urology | Admitting: Urology

## 2018-04-25 HISTORY — DX: Essential (primary) hypertension: I10

## 2018-04-25 HISTORY — DX: Nausea with vomiting, unspecified: R11.2

## 2018-04-25 HISTORY — DX: Adverse effect of unspecified anesthetic, initial encounter: T41.45XA

## 2018-04-25 HISTORY — DX: Other specified postprocedural states: Z98.890

## 2018-04-25 HISTORY — DX: Pain in unspecified joint: M25.50

## 2018-04-25 HISTORY — DX: Other complications of anesthesia, initial encounter: T88.59XA

## 2018-04-25 HISTORY — DX: Unspecified osteoarthritis, unspecified site: M19.90

## 2018-04-25 HISTORY — DX: Personal history of urinary calculi: Z87.442

## 2018-04-25 HISTORY — DX: Gastro-esophageal reflux disease without esophagitis: K21.9

## 2018-04-25 NOTE — Pre-Procedure Instructions (Signed)
Progress Notes - documented in this encounter  Meda Klinefelter., MD - 02/20/2018 3:30 PM EDT Formatting of this note might be different from the original. [facility] Pace Rhe[facility] Follow Up   MRN: [id number]  Chief Complaint:  Chief Complaint  Patient presents with  . in a lot of pain - tailbone, back, and foot  tailbone, and back felt better on prednisone - but pain is back - not as bad as it was. foot pain has never gotten better  . on blood pressure med now - doesn't recall name   HPI: Follow-up joint pain. Tailbone coccyx pain was better on steroid now off of it. With some pain at night. X-ray showed disc of mild scoliosis with degenerative changes but no spondylolisthesis or fracture Left heel pain. Right heel does not bother her Had labs through primary care Depression. On antidepressant. She has been tearful.  ROS: No significant rash. No GI upset.  Past Medical History:  Diagnosis Date  . Anxiety, unspecified  . Asthma without status asthmaticus, unspecified  . Cystic thyroid nodule  . Depression, unspecified  . Elevated blood pressure reading  . Elevated C-reactive protein (CRP)  . Insomnia  . Joint pain, unspecified  . Reflux gastritis  . 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 Swelling of joint of multiple sites  . Vitamin D deficiency, unspecified   Past Surgical History:  Procedure Laterality Date  . CHOLECYSTECTOMY 2003  . PUBOVAGINAL SLING 2003  . VAGINAL HYSTERECTOMY 2003   Medications:  Current Outpatient Medications  Medication Sig Dispense Refill  . albuterol (PROVENTIL) 2.5 mg /3 mL (0.083 %) nebulizer solution Take 2.5 mg by nebulization as needed.  Marland Kitchen albuterol 90 mcg/actuation inhaler Inhale 2 inhalations into the lungs every 6 (six) hours as needed for Wheezing. 1 Inhaler 1  . BUDESONIDE/FORMOTEROL FUMARATE (SYMBICORT INHAL) Inhale 2 Puffs/kg into the lungs daily.  . cholecalciferol (CHOLECALCIFEROL) 1,000 unit tablet Take by  mouth once daily  . clonazePAM (KLONOPIN) 0.5 MG tablet as needed  . levocetirizine (XYZAL) 5 MG tablet Take 5 mg by mouth every evening.  . montelukast (SINGULAIR) 10 mg tablet Take 10 mg by mouth daily.  . sertraline (ZOLOFT) 50 MG tablet Take 50 mg by mouth once daily Patient takes every other day now- 1/8 of a pill.  . sulindac (CLINORIL) 200 MG tablet Take 1 tablet (200 mg total) by mouth 2 (two) times daily 60 tablet 1  . traMADol (ULTRAM) 50 mg tablet   No current facility-administered medications for this visit.   Allergies: Iodinated contrast- oral and iv dye; Ioxaglic acid; Codeine; Compazine [prochlorperazine]; Doxycycline; Iodine; Mescolor [chlorphen-pseudoephed-methscop]; Metrizamide; Plaquenil [hydroxychloroquine]; and Sulfur  Physical Exam   Vitals:  02/20/18 1528  BP: 128/64  Temp: 37 C (98.6 F)  TempSrc: Oral  Weight: 83.5 kg (184 lb)  Height: 160 cm (5\' 3" )  PainSc: 6  PainLoc: Generalized   General: Pleasant female. Tearful. Nontender abdomen. Trace lower extremity edema  Musculoskeletal: Upper extremities without synovitis. Modest extension lumbar spine. Nontender. Coccyx is tender to palpate. Knees without definite effusions. Left plantar fascia is tender. Good range of motion ankle. Good distal pulses. Symmetrical flexes and power  Assessment: Coccydynia Left plantar fasciitis Component of depression Not of evidence of inflammatory arthritis Renal stone on CT  Plan: Asked therapy to see regarding her back and coccyx pain. If unimproved could workup further Asked podiatry to see regarding injecting plantar fascia. Might need to be braced or strapped. Wearing  over-the-counter insert Trial of sulindac 200 twice daily. She will watch her blood pressure. Watch for GI upset. Arrange follow-up after the above.  Portions of this note were created with voice recognition software. I apologize for any typographical errors which were not recognized and  corrected   Electronically signed by Meda Klinefelter., MD at 02/21/2018 10:25 AM EDT     Plan of Treatment - documented as of this encounter  Upcoming Encounters Upcoming Encounters  Date Type Specialty Care Team Description  04/27/2018 Office Visit Obstetrics and Gynecology Sherrie George, [place], [place] [address]  [phone]  [phone] (Fax)     Scheduled Referrals Scheduled Referrals  Name Type Priority Associated Diagnoses Order Schedule  Ambulatory Referral to Physical Therapy Outpatient Referral Routine Coccyodynia  Ordered: 02/20/2018  Ambulatory Referral to Podiatry Outpatient Referral Routine Plantar fasciitis  Ordered: 02/20/2018   Visit Diagnoses - documented in this encounter  Diagnosis  Coccyodynia - Primary  Other disorder of coccyx   Plantar fasciitis  Plantar fascial fibromatosis   Fatigue, unspecified type    Discontinued Medications - documented as of this encounter  Medication Sig Discontinue Reason Start Date End Date  predniSONE (DELTASONE) 5 MG tablet  6 pills x1day, 5x1,4x1,3x1,2x1,1x1 Therapy completed 02/06/2018 02/20/2018   Images Patient Contacts   Contact Name Contact Address Communication Relationship to Patient  Dominga Mcduffie Unknown [phone] [facility]) Spouse, Emergency Contact  Unice Bailey Unknown [phone] (Mobile) Son or Daughter, Emergency Contact   Document Information  Primary Care Provider Other Service Providers Document Coverage Dates  Arnetha Courser MD (Mar. 28, 2018March 28, 2018 - Present) [phone] (Work) [phone] (Fax) [address] [place], [place] [address]   Apr. 08, 2019April 08, 2019   [place] [address] [place], [place] [address]   Encounter Providers Encounter Date  Dorthula Matas MD (Attending) [phone] (Work) [phone] (Fax) [place],  [phone]  Apr. 08, 2019April 08, 2019

## 2018-04-25 NOTE — Patient Instructions (Signed)
Your procedure is scheduled on: 05-02-18 TUESDAY Report to Same Day Surgery 2nd floor medical mall [facility] Entrance-take elevator on left to 2nd floor.  Check in with surgery information desk.) To find out your arrival time please call [phone] between 1PM - 3PM on 05-01-18 MONDAY  Remember: Instructions that are not followed completely may result in serious medical risk, up to and including death, or upon the discretion of your surgeon and anesthesiologist your surgery may need to be rescheduled.    _x___ 1. Do not eat food after midnight the night before your procedure. NO GUM OR CANDY AFTER MIDNIGHT.  You may drink clear liquids up to 2 hours before you are scheduled to arrive at the hospital for your procedure.  Do not drink clear liquids within 2 hours of your scheduled arrival to the hospital.  Clear liquids include  --Water or Apple juice without pulp  --Clear carbohydrate beverage such as ClearFast or Gatorade  --Black Coffee or Clear Tea (No milk, no creamers, do not add anything to the coffee or Tea     __x__ 2. No Alcohol for 24 hours before or after surgery.   __x__3. No Smoking or e-cigarettes for 24 prior to surgery.  Do not use any chewable tobacco products for at least 6 hour prior to surgery   ____  4. Bring all medications with you on the day of surgery if instructed.    __x__ 5. Notify your doctor if there is any change in your medical condition     (cold, fever, infections).    x___6. On the morning of surgery brush your teeth with toothpaste and water.  You may rinse your mouth with mouth wash if you wish.  Do not swallow any toothpaste or mouthwash.   Do not wear jewelry, make-up, hairpins, clips or nail polish.  Do not wear lotions, powders, or perfumes. You may wear deodorant.  Do not shave 48 hours prior to surgery. Men may shave face and neck.  Do not bring valuables to the hospital.    [facility] is not responsible for any belongings or  valuables.               Contacts, dentures or bridgework may not be worn into surgery.  Leave your suitcase in the car. After surgery it may be brought to your room.  For patients admitted to the hospital, discharge time is determined by your treatment team.  _  Patients discharged the day of surgery will not be allowed to drive home.  You will need someone to drive you home and stay with you the night of your procedure.    Please read over the following fact sheets that you were given:   [facility] Preparing for Surgery and or MRSA Information   _x___ TAKE THE FOLLOWING MEDICATION THE MORNING OF SURGERY WITH A SMALL SIP OF WATER. These include:  1. PRILOSEC (OMEPRAZOLE)  2. TAKE AN EXTRA PRILOSEC THE NIGHT BEFORE YOUR SURGERY  3.  4.  5.  6.  ____Fleets enema or Magnesium Citrate as directed.   ____ Use CHG Soap or sage wipes as directed on instruction sheet   _X___ Use inhalers on the day of surgery and bring to hospital day of surgery-USE YOUR ALBUTEROL NEBULIZER AM OF SURGERY AND BRING ALBUTEROL [place]  ____ Stop Metformin and Janumet 2 days prior to surgery.    ____ Take 1/2 of usual insulin dose the night before surgery and none on the  morning surgery.   ____ Follow recommendations from Cardiologist, Pulmonologist or PCP regarding stopping Aspirin, Coumadin, Plavix ,Eliquis, Effient, or Pradaxa, and Pletal.  X____Stop Anti-inflammatories such as Advil, Aleve, Ibuprofen, Motrin, Naproxen, Naprosyn, Goodies powders or aspirin products NOW-OK to take Tylenol OR TRAMADOL IF NEEDED   ____ Stop supplements until after surgery.     ____ Bring C-Pap to the hospital.

## 2018-04-26 ENCOUNTER — Encounter
Admission: RE | Admit: 2018-04-26 | Discharge: 2018-04-26 | Disposition: A | Discharge status: Home / Self Care | Payer: BC Managed Care – PPO | Source: Ambulatory Visit | Attending: Urology | Admitting: Urology

## 2018-04-26 ENCOUNTER — Ambulatory Visit: Payer: BC Managed Care – PPO | Attending: Rheumatology

## 2018-04-26 DIAGNOSIS — M62838 Other muscle spasm: Secondary | ICD-10-CM | POA: Diagnosis present

## 2018-04-26 DIAGNOSIS — M791 Myalgia, unspecified site: Secondary | ICD-10-CM | POA: Diagnosis not present

## 2018-04-26 DIAGNOSIS — M629 Disorder of muscle, unspecified: Secondary | ICD-10-CM | POA: Diagnosis present

## 2018-04-26 DIAGNOSIS — M6289 Other specified disorders of muscle: Secondary | ICD-10-CM

## 2018-04-26 DIAGNOSIS — M533 Sacrococcygeal disorders, not elsewhere classified: Secondary | ICD-10-CM | POA: Diagnosis present

## 2018-04-26 DIAGNOSIS — I10 Essential (primary) hypertension: Secondary | ICD-10-CM | POA: Diagnosis present

## 2018-04-26 NOTE — Therapy (Signed)
[place] MAIN [facility] SERVICES [address] [place], [place], [address] Phone: [phone]   Fax:  [phone]  Physical Therapy Treatment  Patient Details  Name: Yolanda Pace MRN: [id number] Date of Birth: [birth year] Referring Provider: Tomasita Morrow   Encounter Date: 04/26/2018  PT End of Session - 04/26/18 2246    Visit Number  5    Number of Visits  12    Date for PT Re-Evaluation  06/06/18    PT Start Time  1536    PT Stop Time  1636    PT Time Calculation (min)  60 min    Activity Tolerance  Patient limited by pain    Behavior During Therapy  [facility] for tasks assessed/performed       Past Medical History:  Diagnosis Date  . Allergic rhinitis   . Anxiety   . Arthritis   . Asthma    WELL CONTROLLED  . Complication of anesthesia    PT STATES THAT SHE WAS GIVEN SOME TYPE OF ANESTHESIA THAT MADE HER HAVE A SEIZURE IN 2015  . Cystic thyroid nodule March 2015   12mm on u/s  . Depression   . GERD (gastroesophageal reflux disease)   . History of kidney stones    SMALL STONE CURRENTLY  . Hypertension   . Insomnia   . Joint pain 04/2018   PT SEEING RHEUMATOLOGIST FOR JOINT PAIN THAT OCCURS APP ONCE A MONTH AND IS SO BAD THAT SHE CANT GET OUT OF BED  . Obesity   . PONV (postoperative nausea and vomiting)   . Right thyroid nodule 05/26/2017   Solid 1 cm RIGHT lobe July 2018  . Seizures ([place]) June 2015   provoked during cervical nerve block  . Skin cancer Aug 2015   removed from back  . Vitamin D deficiency disease     Past Surgical History:  Procedure Laterality Date  . ABDOMINAL HYSTERECTOMY  2005ish   fibroids, ovaries remain; along with bladder tack  . bladder tack  2005ish  . CHOLECYSTECTOMY    . SKIN CANCER EXCISION  Aug 2015   removed from back    There were no vitals filed for this visit.    Pelvic Floor Physical Therapy Treatment Note  SCREENING  Changes in medications, allergies, or medical history?: tramadol     SUBJECTIVE  Patient reports: She is scheduled to see a PT tomorrow for her L shoulder from the fall. She had a weekend where she "crashed" and a weekend where she almost felt like her self again.   Precautions:  Chronic Pain  Pain update:  Location of pain: Arm and shoulder worst, "everything else" is a 6 Current pain:  7/10  Max pain:  9/10 Least pain:  6/10 Nature of pain: Sharp, ache  Patient Goals: Decrease pain, constipation, and urinary frequency so she can work, have intercourse, and participate in activities with her family.    OBJECTIVE  Changes in: Posture/Observations:  Patient is favoring her L shoulder/arm not using it actively unless necessary.   Palpation: TTP to L ASIS and L Supraspinatus  Gait Analysis: Less antalgic but continues to minimize rotation and vault.  INTERVENTIONS THIS SESSION: Self-care: educated on Graded motor imagery principles and had patient look for orientate app to begin using for chronic pain management. Discussed coordinating care with her other PT to make sure that she continues to progress and is not flared-up with too much too fast. Instructed Pt. To keep a dairy  to help [place] determine what progress she is making and when/if we over-do it what brought on the flare up. Ultrasound: performed 20% duty-cycle pulsed ultrasound to L ASIS region and L supraspinatus near insertion to decrease inflammation, hopefully, without flaring-up her pain.   Total time: 60 min.                          PT Education - 04/26/18 2245    Education provided  Yes    Education Details  See Pt. Instructions and Interventions this session.    Person(s) Educated  Patient    Methods  Explanation;Handout    Comprehension  Verbalized understanding       PT Short Term Goals - 03/14/18 1834      PT SHORT TERM GOAL #1   Title  Pt will be able to Walk for 10 blocks without increase in pain to demonstrate improved activity  tolerance.    Baseline  1 block    Time  6    Period  Weeks    Status  New    Target Date  04/25/18      PT SHORT TERM GOAL #2   Title  Pt. will be able to stand for 45 minutes without increased pain. to demonstrate improved function     Baseline  15 minutes    Time  6    Period  Weeks    Status  New    Target Date  04/25/18      PT SHORT TERM GOAL #3   Title  Pt. will get up no more than 6 times per night to go to the bathroom to improve quality of sleep and allow for healing and down-regulation of pain.    Time  6    Period  Weeks    Status  New    Target Date  04/25/18      PT SHORT TERM GOAL #4   Title  Patient will demonstrate improved pelvic alignment and balance of musculature surrounding the pelvis to facilitate decreased PFM spasms and decrease pelvic pain.    Time  6    Period  Weeks    Status  New    Target Date  04/25/18        PT Long Term Goals - 03/14/18 1839      PT LONG TERM GOAL #1   Title  Patient will score less than or equal to 20% on the Pain Disability Index to demonstrate a decrease in disability from moderate to minimal.     Baseline  63/70 (90%)     Time  12    Period  Weeks    Status  New    Target Date  06/06/18      PT LONG TERM GOAL #2   Title  Patient will score less than or equal to 20% on the Female NIH-CPSI to demonstrate a reduction in pain, urinary symptoms, and an improved quality of life.    Baseline  35/43 (81%)    Time  12    Period  Weeks    Status  New    Target Date  06/06/18      PT LONG TERM GOAL #3   Title  Patient will be able to work a full day without a pain increase of greater than 2 from baseline.    Baseline  By the end of the week pain is so bad that she comes home  and goes straight to bed, sleeps most of the weekend.    Time  12    Period  Weeks    Status  New    Target Date  06/06/18      PT LONG TERM GOAL #4   Title  Patient will be able to participate in activities with her family such as going to the  fair or out to dinner and participate fully wothout being limited by pain.    Baseline  chooses not to go or can only participate at ~ 25% with family outings due to increased pain with standing and walking.    Time  12    Period  Weeks    Status  New    Target Date  06/06/18      PT LONG TERM GOAL #5   Title  Patient will report no pain with intercourse to demonstrate improved functional ability.    Baseline  has only had intercourse 1 time in past 3 years due to pain.    Time  12    Period  Weeks    Status  New    Target Date  06/06/18      Additional Long Term Goals   Additional Long Term Goals  Yes      PT LONG TERM GOAL #6   Title  Patient will report urinating 6-8 times per day  and 1 or less times at night over the course of the prior week to demonstrate decreased frequency.    Baseline  urinating ~ every 30-45 min. and ~12 times per night.    Time  12    Period  Weeks    Status  New    Target Date  06/06/18            Plan - 04/26/18 2248    Clinical Impression Statement  Pt. responded well to all interventions today, demonstrating understanding of all education provided and a 2 point reduction in pain from treatment today. Continue per POC.    Clinical Presentation  Evolving    Clinical Decision Making  High    Rehab Potential  Fair    Clinical Impairments Affecting Rehab Potential  Chronic pain, athsma    PT Frequency  1x / week    PT Duration  12 weeks    PT Treatment/Interventions  ADLs/Self Care Home Management;Biofeedback;Aquatic Therapy;Electrical Stimulation;Moist Heat;Traction;Ultrasound;Balance training;Therapeutic exercise;Therapeutic activities;Functional mobility training;Neuromuscular re-education;Patient/family education;Scar mobilization;Manual techniques;Passive range of motion;Dry needling;Energy conservation;Taping    PT Next Visit Plan  Progress GMI, R sided DN or gentle STM     PT Home Exercise Plan  diaphragmatic breathing and pain science,  graded activity, self light massage to decrease sensitivity.    Consulted and Agree with Plan of Care  Patient       Patient will benefit from skilled therapeutic intervention in order to improve the following deficits and impairments:  Abnormal gait, Increased fascial restricitons, Improper body mechanics, Pain, Cardiopulmonary status limiting activity, Decreased coordination, Decreased mobility, Decreased scar mobility, Increased muscle spasms, Postural dysfunction, Decreased activity tolerance, Decreased endurance, Decreased range of motion, Decreased strength, Decreased balance, Difficulty walking, Impaired flexibility  Visit Diagnosis: Myalgia  Sacrococcygeal disorders, not elsewhere classified  Other muscle spasm  Muscular imbalance     Problem List Patient Active Problem List   Diagnosis Date Noted  . Kidney stone 02/20/2018  . Renal lesion 02/20/2018  . Benign hypertension 01/21/2018  . Pelvic pain 12/26/2017  . Depression, major, recurrent, moderate ([place]) 12/26/2017  .  GERD (gastroesophageal reflux disease) 11/09/2017  . 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 Elevated C-reactive protein (CRP) 07/11/2017  . Right thyroid nodule 05/26/2017  . Mild hypercholesterolemia 05/06/2017  . Medication monitoring encounter 05/06/2017  . Generalized osteoarthritis of hand 12/13/2016  . Impingement syndrome of shoulder region, left 11/29/2016  . Heartburn 11/16/2016  . Pain, joint, multiple sites 05/07/2016  . Preventative health care 10/18/2015  . Elevated blood pressure reading 10/18/2015  . Breast cancer screening 10/17/2015  . Asthma   . Anxiety   . Depression   . Allergic rhinitis   . Insomnia   . Obesity   . Vitamin D deficiency disease   . Cystic thyroid nodule 01/13/2014   Willa Rough DPT, ATC Willa Rough 04/26/2018, 11:10 PM  [place] MAIN [facility] SERVICES [address] [place], [place], [address] Phone:  [phone]   Fax:  [phone]  Name: Yolanda Pace MRN: [id number] Date of Birth: 06-07-1966

## 2018-04-26 NOTE — Patient Instructions (Signed)
1) Download Orientate app use it 5 times per day with just the first hand level for two days, and progressing through the feet if no exacerbation of pain. Make note and email me if you have an increase in pain.  2) Keep a dairy of:  -Activities you did, date, pain level, fatigue, mental state, what you did with PT exercises etc.

## 2018-04-28 NOTE — H&P (Signed)
NAME: Yolanda Pace, NORGARD MEDICAL RECORD EX:[id number] ACCOUNT [id number] DATE OF BIRTH:26-Jan-1966 FACILITY: [facility] LOCATION: [facility] PHYSICIAN:MICHAEL Farrel Conners, MD  HISTORY AND PHYSICAL  DATE OF ADMISSION:  05/02/2018  CHIEF COMPLAINT:  Bladder pain and urinary incontinence.  HISTORY OF PRESENT ILLNESS:  The patient is a 52 year old white female with a long history of chronic pelvic pain and bladder pain.  She was diagnosed with interstitial cystitis per a potassium sensitivity test many years ago but did not proceed with any  treatment or hydrodilatation.  Puff symptom score was 17 with a bother score of 11 and a total score of 28.  She is on chronic pain management with Tylenol, ibuprofen and tramadol.  The patient also experiences postvoid dribbling incontinence and urge  incontinence.  She wears 2 pads per day.  She denies stress incontinence.  She has a history of having had a transobturator sling placement in 2003.  Continence function score was 16 and OAB symptom score was 25.  The patient comes in now for cystoscopy  with hydrodilatation.  ALLERGIES:  IODINE, CODEINE, COMPAZINE, SULFASALAZINE, ____, AND DOXYCYCLINE.  CURRENT MEDICATIONS:  Montelukast, sertraline, vitamin D3, amlodipine, magnesium, omeprazole, albuterol inhaler, tramadol, Allegra, ibuprofen and Tylenol.  PAST SURGICAL HISTORY: 1.  Hysterectomy. 2.  Cholecystectomy. 3.  Transobturator sling in 2003.  SOCIAL HISTORY:  The patient denied tobacco or alcohol use.  FAMILY HISTORY:  Negative for urological disease.  PAST AND CURRENT MEDICAL CONDITIONS: 1.  Hypertension. 2.  Depression. 3.  GERD. 4.  Chronic pain. 5.  Osteoarthritis. 6.  Rheumatoid arthritis. 7.  Asthma. 8.  Allergic rhinitis. 9.  Interstitial cystitis. 10.  Chronic pelvic pain. 11.  Urinary incontinence.  REVIEW OF SYSTEMS:  The patient denies chest pain, shortness of breath, diabetes, stroke or heart disease.  PHYSICAL  EXAMINATION: GENERAL:  Obese white female in no acute distress. HEENT:  Sclerae were clear.  Pupils were equally round, reactive to light and accommodation.  Extraocular movements were intact. NECK:  No palpable cervical masses. PULMONARY:  Lungs clear to auscultation. CARDIOVASCULAR:  Regular rhythm and rate without audible murmurs. ABDOMEN:  Soft, nontender abdomen. GENITOURINARY:  Normal external female genitalia.  She had first-degree cystocele with negative Marshall test.  Bladder neck was fixed.  She had point tenderness in the lateral perirectal region as well as in the area of the pubovaginal sling.  She had  no vaginal discharge.  Urethral meatus was normal. RECTAL:  No palpable rectal masses. NEUROMUSCULAR:  Alert and oriented x3.  IMPRESSION: 1.  Painful bladder syndrome. 2.  Interstitial cystitis. 3.  Urinary incontinence.  PLAN:  Cystoscopy with hydrodilatation.  LN/NUANCE  D:04/27/2018 T:04/27/2018 JOB:[id number]

## 2018-05-01 ENCOUNTER — Encounter: Payer: Self-pay | Admitting: *Deleted

## 2018-05-02 ENCOUNTER — Ambulatory Visit: Payer: BC Managed Care – PPO | Admitting: Anesthesiology

## 2018-05-02 ENCOUNTER — Other Ambulatory Visit: Payer: Self-pay

## 2018-05-02 ENCOUNTER — Ambulatory Visit
Admission: RE | Admit: 2018-05-02 | Discharge: 2018-05-02 | Disposition: A | Discharge status: Home / Self Care | Payer: BC Managed Care – PPO | Source: Ambulatory Visit | Attending: Urology | Admitting: Urology

## 2018-05-02 ENCOUNTER — Encounter
Admission: RE | Disposition: A | Discharge status: Home / Self Care | Payer: Self-pay | Source: Ambulatory Visit | Attending: Urology

## 2018-05-02 DIAGNOSIS — R32 Unspecified urinary incontinence: Secondary | ICD-10-CM | POA: Diagnosis not present

## 2018-05-02 DIAGNOSIS — N3010 Interstitial cystitis (chronic) without hematuria: Secondary | ICD-10-CM | POA: Diagnosis not present

## 2018-05-02 DIAGNOSIS — N3289 Other specified disorders of bladder: Secondary | ICD-10-CM | POA: Diagnosis not present

## 2018-05-02 DIAGNOSIS — Z90710 Acquired absence of both cervix and uterus: Secondary | ICD-10-CM | POA: Diagnosis not present

## 2018-05-02 DIAGNOSIS — R3989 Other symptoms and signs involving the genitourinary system: Secondary | ICD-10-CM | POA: Diagnosis present

## 2018-05-02 DIAGNOSIS — G8929 Other chronic pain: Secondary | ICD-10-CM | POA: Insufficient documentation

## 2018-05-02 HISTORY — PX: CYSTO WITH HYDRODISTENSION: SHX5453

## 2018-05-02 SURGERY — CYSTOSCOPY, WITH BLADDER HYDRODISTENSION
Anesthesia: General | Site: Bladder | Wound class: Clean Contaminated

## 2018-05-02 MED ORDER — KETOROLAC TROMETHAMINE 30 MG/ML IJ SOLN
INTRAMUSCULAR | Status: DC | PRN
  Administered 2018-05-02: 30 mg via INTRAVENOUS

## 2018-05-02 MED ORDER — TOLTERODINE TARTRATE ER 4 MG PO CP24: 4.0000 mg | ORAL_CAPSULE | Freq: Every day | ORAL | 0 refills | Status: DC

## 2018-05-02 MED ORDER — ONDANSETRON HCL 4 MG/2ML IJ SOLN
INTRAMUSCULAR | Status: DC | PRN
  Administered 2018-05-02: 4 mg via INTRAVENOUS

## 2018-05-02 MED ORDER — LACTATED RINGERS IV SOLN
INTRAVENOUS | Status: DC
  Administered 2018-05-02: 14:00:00 via INTRAVENOUS

## 2018-05-02 MED ORDER — FENTANYL CITRATE (PF) 100 MCG/2ML IJ SOLN
INTRAMUSCULAR | Status: AC
  Filled 2018-05-02: qty 2

## 2018-05-02 MED ORDER — FENTANYL CITRATE (PF) 100 MCG/2ML IJ SOLN
INTRAMUSCULAR | Status: DC | PRN
  Administered 2018-05-02: 25 ug via INTRAVENOUS
  Administered 2018-05-02: 50 ug via INTRAVENOUS
  Administered 2018-05-02: 25 ug via INTRAVENOUS

## 2018-05-02 MED ORDER — CEFAZOLIN SODIUM-DEXTROSE 1-4 GM/50ML-% IV SOLN
INTRAVENOUS | Status: AC
  Filled 2018-05-02: qty 50

## 2018-05-02 MED ORDER — PHENYLEPHRINE HCL 10 MG/ML IJ SOLN
INTRAMUSCULAR | Status: DC | PRN
  Administered 2018-05-02 (×2): 100 ug via INTRAVENOUS

## 2018-05-02 MED ORDER — DEXAMETHASONE SODIUM PHOSPHATE 10 MG/ML IJ SOLN
INTRAMUSCULAR | Status: DC | PRN
  Administered 2018-05-02: 5 mg via INTRAVENOUS

## 2018-05-02 MED ORDER — MIDAZOLAM HCL 5 MG/5ML IJ SOLN
INTRAMUSCULAR | Status: DC | PRN
  Administered 2018-05-02: 2 mg via INTRAVENOUS

## 2018-05-02 MED ORDER — PROPOFOL 10 MG/ML IV BOLUS
INTRAVENOUS | Status: DC | PRN
  Administered 2018-05-02: 30 mg via INTRAVENOUS
  Administered 2018-05-02: 20 mg via INTRAVENOUS
  Administered 2018-05-02: 150 mg via INTRAVENOUS

## 2018-05-02 MED ORDER — KETOROLAC TROMETHAMINE 30 MG/ML IJ SOLN
INTRAMUSCULAR | Status: AC
  Filled 2018-05-02: qty 1

## 2018-05-02 MED ORDER — FENTANYL CITRATE (PF) 100 MCG/2ML IJ SOLN
25.0000 ug | INTRAMUSCULAR | Status: DC | PRN
  Administered 2018-05-02 (×2): 25 ug via INTRAVENOUS

## 2018-05-02 MED ORDER — MIDAZOLAM HCL 2 MG/2ML IJ SOLN
INTRAMUSCULAR | Status: AC
  Filled 2018-05-02: qty 2

## 2018-05-02 MED ORDER — LIDOCAINE HCL (CARDIAC) PF 100 MG/5ML IV SOSY
PREFILLED_SYRINGE | INTRAVENOUS | Status: DC | PRN
  Administered 2018-05-02: 60 mg via INTRAVENOUS

## 2018-05-02 MED ORDER — ONDANSETRON HCL 4 MG/2ML IJ SOLN: 4.0000 mg | Freq: Once | INTRAMUSCULAR | Status: DC | PRN

## 2018-05-02 MED ORDER — LIDOCAINE HCL URETHRAL/MUCOSAL 2 % EX GEL
CUTANEOUS | Status: DC | PRN
  Administered 2018-05-02: 1

## 2018-05-02 MED ORDER — LIDOCAINE HCL URETHRAL/MUCOSAL 2 % EX GEL
CUTANEOUS | Status: AC
  Filled 2018-05-02: qty 10

## 2018-05-02 MED ORDER — CEFAZOLIN SODIUM-DEXTROSE 1-4 GM/50ML-% IV SOLN
1.0000 g | Freq: Once | INTRAVENOUS | Status: AC
  Administered 2018-05-02: 1 g via INTRAVENOUS

## 2018-05-02 MED ORDER — BELLADONNA ALKALOIDS-OPIUM 16.2-60 MG RE SUPP
RECTAL | Status: AC
Start: 2018-05-02 — End: ?
  Filled 2018-05-02: qty 1

## 2018-05-02 MED ORDER — BELLADONNA ALKALOIDS-OPIUM 16.2-60 MG RE SUPP
RECTAL | Status: DC | PRN
  Administered 2018-05-02: 1 via RECTAL

## 2018-05-02 MED ORDER — FENTANYL CITRATE (PF) 100 MCG/2ML IJ SOLN
INTRAMUSCULAR | Status: AC
  Administered 2018-05-02: 25 ug via INTRAVENOUS
  Filled 2018-05-02: qty 2

## 2018-05-02 SURGICAL SUPPLY — 13 items
BAG DRAIN CYSTO-URO LG1000N (MISCELLANEOUS) ×2 IMPLANT
BRUSH SCRUB EZ  4% CHG (MISCELLANEOUS) ×1
BRUSH SCRUB EZ 4% CHG (MISCELLANEOUS) ×1 IMPLANT
GLOVE BIO SURGEON STRL SZ7 (GLOVE) ×4 IMPLANT
GLOVE BIO SURGEON STRL SZ7.5 (GLOVE) ×2 IMPLANT
GOWN STRL REUS W/ TWL LRG LVL3 (GOWN DISPOSABLE) ×1 IMPLANT
GOWN STRL REUS W/ TWL XL LVL3 (GOWN DISPOSABLE) ×1 IMPLANT
GOWN STRL REUS W/TWL LRG LVL3 (GOWN DISPOSABLE) ×2
GOWN STRL REUS W/TWL XL LVL3 (GOWN DISPOSABLE) ×2
PACK CYSTO AR (MISCELLANEOUS) ×2 IMPLANT
SET CYSTO W/LG BORE CLAMP LF (SET/KITS/TRAYS/PACK) ×2 IMPLANT
WATER STERILE IRR 1000ML POUR (IV SOLUTION) ×2 IMPLANT
WATER STERILE IRR 3000ML UROMA (IV SOLUTION) ×2 IMPLANT

## 2018-05-02 NOTE — Anesthesia Preprocedure Evaluation (Signed)
Anesthesia Evaluation  Patient identified by MRN, date of birth, ID band Patient awake    Reviewed: Allergy & Precautions, H&P , NPO status , Patient's Chart, lab work & pertinent test results, reviewed documented beta blocker date and time   History of Anesthesia Complications (+) PONV and history of anesthetic complications  Airway Mallampati: II  TM Distance: >3 FB Neck ROM: full    Dental  (+) Teeth Intact   Pulmonary neg pulmonary ROS, asthma ,    Pulmonary exam normal        Cardiovascular Exercise Tolerance: Good hypertension, On Medications negative cardio ROS Normal cardiovascular exam Rate:Normal     Neuro/Psych Seizures -, Well Controlled,  PSYCHIATRIC DISORDERS Anxiety Depression negative neurological ROS  negative psych ROS   GI/Hepatic negative GI ROS, Neg liver ROS, GERD  Medicated,  Endo/Other  negative endocrine ROS  Renal/GU Renal diseasenegative Renal ROS  negative genitourinary   Musculoskeletal   Abdominal   Peds  Hematology negative hematology ROS (+)   Anesthesia Other Findings   Reproductive/Obstetrics negative OB ROS                             Anesthesia Physical Anesthesia Plan  ASA: II  Anesthesia Plan: General LMA   Post-op Pain Management:    Induction:   PONV Risk Score and Plan:   Airway Management Planned:   Additional Equipment:   Intra-op Plan:   Post-operative Plan:   Informed Consent: I have reviewed the patients History and Physical, chart, labs and discussed the procedure including the risks, benefits and alternatives for the proposed anesthesia with the patient or authorized representative who has indicated his/her understanding and acceptance.     Plan Discussed with: CRNA  Anesthesia Plan Comments:         Anesthesia Quick Evaluation

## 2018-05-02 NOTE — Anesthesia Post-op Follow-up Note (Signed)
Anesthesia QCDR form completed.        

## 2018-05-02 NOTE — Anesthesia Procedure Notes (Signed)
Procedure Name: LMA Insertion Date/Time: 05/02/2018 5:19 PM Performed by: Dionne Bucy, CRNA Pre-anesthesia Checklist: Patient identified, Patient being monitored, Timeout performed, Emergency Drugs available and Suction available Patient Re-evaluated:Patient Re-evaluated prior to induction Oxygen Delivery Method: Circle system utilized Preoxygenation: Pre-oxygenation with 100% oxygen Induction Type: IV induction Ventilation: Mask ventilation without difficulty LMA: LMA inserted LMA Size: 4.0 Tube type: Oral Number of attempts: 1 Placement Confirmation: positive ETCO2 and breath sounds checked- equal and bilateral Tube secured with: Tape Dental Injury: Teeth and Oropharynx as per pre-operative assessment

## 2018-05-02 NOTE — Op Note (Signed)
Preoperative diagnosis: Painful bladder syndrome and urinary incontinence  Postoperative diagnosis: Same  Procedure: Cystoscopy with hydrodilatation  Surgeon: Otelia Limes. Yves Dill MD  Anesthesia: General  Indications:See the history and physical. After informed consent the above procedure(s) were requested     Technique and findings: After adequate general anesthesia been obtained patient was placed into dorsal lithotomy position and the perineum was prepped and draped in the usual fashion.  Vaginal exam revealed intact anterior vaginal mucosa.  The 21 French cystoscope was coupled the camera and placed into the bladder.  The urethra was intact without evidence of sling erosion.  The bladder was thoroughly inspected.  Both ureteral orifices were identified and had clear efflux.  No bladder mucosal lesions were identified.  Specifically no bladder ulcerations were identified.  The bladder was then filled to capacity at 70 cm of water pressure.  Bladder was drained and capacity of 500 cc noted.  Section of the bladder mucosa did not reveal any glomerulations or ulcerations.  The hydrodilatation was repeated and capacity of 600 cc noted.  Again no bladder mucosal lesions such as ulcers or glomerulations were identified.  The cystoscope was then removed and 10 cc of viscous Xylocaine was instilled within the urethra.  A B&O suppository was placed.  Procedure was then terminated and patient transferred to the recovery room in stable condition.

## 2018-05-02 NOTE — Transfer of Care (Signed)
Immediate Anesthesia Transfer of Care Note  Patient: Yolanda Pace  Procedure(s) Performed: CYSTOSCOPY/HYDRODISTENSION (N/A Bladder)  Patient Location: PACU  Anesthesia Type:General  Level of Consciousness: sedated  Airway & Oxygen Therapy: Patient Spontanous Breathing and Patient connected to face mask oxygen  Post-op Assessment: Report given to RN and Post -op Vital signs reviewed and stable  Post vital signs: Reviewed and stable  Last Vitals:  Vitals Value Taken Time  BP 137/72 05/02/2018  5:50 PM  Temp 36.3 C 05/02/2018  5:50 PM  Pulse 101 05/02/2018  5:51 PM  Resp 21 05/02/2018  5:51 PM  SpO2 100 % 05/02/2018  5:51 PM  Vitals shown include unvalidated device data.  Last Pain:  Vitals:   05/02/18 1426  TempSrc: Oral  PainSc: 5          Complications: No apparent anesthesia complications

## 2018-05-02 NOTE — Anesthesia Postprocedure Evaluation (Signed)
Anesthesia Post Note  Patient: Yolanda Pace  Procedure(s) Performed: CYSTOSCOPY/HYDRODISTENSION (N/A Bladder)  Patient location during evaluation: PACU Anesthesia Type: General Level of consciousness: awake and alert Pain management: pain level controlled Vital Signs Assessment: post-procedure vital signs reviewed and stable Respiratory status: spontaneous breathing, nonlabored ventilation, respiratory function stable and patient connected to nasal cannula oxygen Cardiovascular status: blood pressure returned to baseline and stable Postop Assessment: no apparent nausea or vomiting Anesthetic complications: no     Last Vitals:  Vitals:   05/02/18 1843 05/02/18 1900  BP: (!) 120/50 (!) 144/60  Pulse: 75 80  Resp: 16 16  Temp: 36.6 C   SpO2: 93% 95%    Last Pain:  Vitals:   05/02/18 1843  TempSrc: Oral  PainSc: 2                  Anona Giovannini S

## 2018-05-02 NOTE — H&P (Signed)
Date of Initial H&P:04/27/18  History reviewed, patient examined, no change in status, stable for surgery.

## 2018-05-02 NOTE — Discharge Instructions (Addendum)
AMBULATORY SURGERY  DISCHARGE INSTRUCTIONS   1) The drugs that you were given will stay in your system until tomorrow so for the next 24 hours you should not:  A) Drive an automobile B) Make any legal decisions C) Drink any alcoholic beverage   2) You may resume regular meals tomorrow.  Today it is better to start with liquids and gradually work up to solid foods.  You may eat anything you prefer, but it is better to start with liquids, then soup and crackers, and gradually work up to solid foods.   3) Please notify your doctor immediately if you have any unusual bleeding, trouble breathing, redness and pain at the surgery site, drainage, fever, or pain not relieved by medication. 4)   5) Your post-operative visit with Dr.                                     is: Date:                        Time:    Please call to schedule your post-operative visit.  6) Additional Instructions:      Cystoscopy, Care After Refer to this sheet in the next few weeks. These instructions provide you with information about caring for yourself after your procedure. Your health care provider may also give you more specific instructions. Your treatment has been planned according to current medical practices, but problems sometimes occur. Call your health care provider if you have any problems or questions after your procedure. What can I expect after the procedure? After the procedure, it is common to have:  Mild pain when you urinate. Pain should stop within a few minutes after you urinate. This may last for up to 1 week.  A small amount of blood in your urine for several days.  Feeling like you need to urinate but producing only a small amount of urine.  Follow these instructions at home:  Medicines  Take over-the-counter and prescription medicines only as told by your health care provider.  If you were prescribed an antibiotic medicine, take it as told by your health care provider. Do not  stop taking the antibiotic even if you start to feel better. General instructions   Return to your normal activities as told by your health care provider. Ask your health care provider what activities are safe for you.  Do not drive for 24 hours if you received a sedative.  Watch for any blood in your urine. If the amount of blood in your urine increases, call your health care provider.  Follow instructions from your health care provider about eating or drinking restrictions.  If a tissue sample was removed for testing (biopsy) during your procedure, it is your responsibility to get your test results. Ask your health care provider or the department performing the test when your results will be ready.  Drink enough fluid to keep your urine clear or pale yellow.  Keep all follow-up visits as told by your health care provider. This is important. Contact a health care provider if:  You have pain that gets worse or does not get better with medicine, especially pain when you urinate.  You have difficulty urinating. Get help right away if:  You have more blood in your urine.  You have blood clots in your urine.  You have abdominal pain.  You have  a fever or chills.  You are unable to urinate. This information is not intended to replace advice given to you by your health care provider. Make sure you discuss any questions you have with your health care provider. Document Released: 05/21/2005 Document Revised: 04/08/2016 Document Reviewed: 09/18/2015 Elsevier Interactive Patient Education  [employer].

## 2018-05-03 ENCOUNTER — Ambulatory Visit: Payer: BC Managed Care – PPO

## 2018-05-03 ENCOUNTER — Encounter: Payer: Self-pay | Admitting: Urology

## 2018-05-04 ENCOUNTER — Ambulatory Visit: Payer: BC Managed Care – PPO

## 2018-05-08 ENCOUNTER — Telehealth (INDEPENDENT_AMBULATORY_CARE_PROVIDER_SITE_OTHER): Payer: Self-pay | Admitting: Female Pelvic Medicine and Reconstructive Surgery

## 2018-05-10 ENCOUNTER — Ambulatory Visit: Payer: BC Managed Care – PPO | Admitting: Family Medicine

## 2018-05-10 ENCOUNTER — Ambulatory Visit: Payer: BC Managed Care – PPO

## 2018-05-10 DIAGNOSIS — M629 Disorder of muscle, unspecified: Secondary | ICD-10-CM

## 2018-05-10 DIAGNOSIS — M791 Myalgia, unspecified site: Secondary | ICD-10-CM | POA: Diagnosis not present

## 2018-05-10 DIAGNOSIS — M62838 Other muscle spasm: Secondary | ICD-10-CM

## 2018-05-10 DIAGNOSIS — M6289 Other specified disorders of muscle: Secondary | ICD-10-CM

## 2018-05-10 DIAGNOSIS — M533 Sacrococcygeal disorders, not elsewhere classified: Secondary | ICD-10-CM

## 2018-05-10 NOTE — Patient Instructions (Addendum)
Graded motor Imagery by Larae Grooms   1) Right-left discrimination  Start with hand, progress to feet, and back. 2) Explicit Motor Imagery  Start with walking on a tract etc. Progress to store, then arm-only motions then lifting, carrying, then bending loading in car etc.  3) Mirror therapy  Start with L hand behind mirror, just watching the R hand in the mirror, progress to gentle movements at the same time and then to more "aggrivating" motions. Then move on to foot in the same progression and even whole leg.  Recognise app for back pictures with rotation. Or make yourself flas-cards of pictures with a person looking over their shoulder or reaching different ways.    The pool is great for you but it is easy to feel good in the moment and overdo it. Be easy until you know how you are going to respond.

## 2018-05-10 NOTE — Therapy (Signed)
[place] MAIN [facility] SERVICES [address] [place], [place], [address] Phone: [phone]   Fax:  [phone]  Physical Therapy Treatment  Patient Details  Name: Yolanda Pace MRN: [id number] Date of Birth: 21-Feb-1966 Referring Provider: Tomasita Morrow   Encounter Date: 05/10/2018  PT End of Session - 05/11/18 1058    Visit Number  6    Number of Visits  12    Date for PT Re-Evaluation  06/06/18    PT Start Time  3716    PT Stop Time  1635    PT Time Calculation (min)  60 min    Activity Tolerance  Patient limited by pain    Behavior During Therapy  [facility] for tasks assessed/performed       Past Medical History:  Diagnosis Date  . Allergic rhinitis   . Anxiety   . Arthritis   . Asthma    WELL CONTROLLED  . Complication of anesthesia    PT STATES THAT SHE WAS GIVEN SOME TYPE OF ANESTHESIA THAT MADE HER HAVE A SEIZURE IN 2015-NOTE IS IN CHART FROM [place] ANESTHESIA FROM 2015  . Cystic thyroid nodule March 2015   40mm on u/s  . Depression   . GERD (gastroesophageal reflux disease)   . History of kidney stones    SMALL STONE CURRENTLY  . Hypertension   . Insomnia   . Joint pain 04/2018   PT SEEING RHEUMATOLOGIST FOR JOINT PAIN THAT OCCURS APP ONCE A MONTH AND IS SO BAD THAT SHE CANT GET OUT OF BED  . Obesity   . PONV (postoperative nausea and vomiting)   . Right thyroid nodule 05/26/2017   Solid 1 cm RIGHT lobe July 2018  . Seizures ([place]) June 2015   provoked during cervical nerve block  . Skin cancer Aug 2015   removed from back  . Vitamin D deficiency disease     Past Surgical History:  Procedure Laterality Date  . ABDOMINAL HYSTERECTOMY  2005ish   fibroids, ovaries remain; along with bladder tack  . bladder tack  2005ish  . CHOLECYSTECTOMY    . CYSTO WITH HYDRODISTENSION N/A 05/02/2018   Procedure: CYSTOSCOPY/HYDRODISTENSION;  Surgeon: Royston Cowper, MD;  Location: [facility] ORS;  Service: Urology;  Laterality: N/A;  . SKIN  CANCER EXCISION  Aug 2015   removed from back    There were no vitals filed for this visit.    Pelvic Floor Physical Therapy Treatment Note  SCREENING  Changes in medications, allergies, or medical history?: Hydrodistention of the bladder. Dr. Leafy Ro suggested that she have the bladder sling removed   SUBJECTIVE  Patient reports: She is doing a little better with urinary frequency since bladder distension, during the day she can go 2-3 hours before she has to go to the bathroom. She is still getting up ~ 5 times at night rather than 11. She crashed Thursday-Sunday following the procedure. She went to her first PT session for her shoulder today. The hardest part was roll-outs on a physioball. She has been starting to try to use her L arm to do things like gently wash her hair. Feels that she is doing much better this summer since she can moderate her activities better than when at work.   Precautions:  Chronic Pain/CRPS-like presentation  Pain update: Asking about pain at every session with a chronic pain paitienet has been shown to be potentially harmful so we address the issue more from a functional restriction view.  Patient Goals: Decrease pain, constipation, and urinary frequency so she can work, have intercourse, and participate in activities with her family.   OBJECTIVE  Changes in: Posture/Observations:  Arrives supporting her L arm in her R hand but is walking faster and less cautiously. Pain increased during session while sitting (potentially from recent shoulder therapy or prolonged sitting)  Palpation: TTP through R glute med and TFL  INTERVENTIONS THIS SESSION: Self-care: Patient educated on how GMI progression works to help set her up to progress on her own if needed due to her concern that she will need to have PT after she has her bladder mesh removed and not wanting to use up her sessions too early. We also followed up with billing department and found out  that she is OK to continue due to the medical necessity. Manual: performed gentle STM and TP release to R glute med and TFL to effect sensitivity both locally and on the more effected L hip indirectly.  Therex: educated on what is appropriate movement to begin trying in the pool and ho to grade the exposure to see how her body is going to handle the stress.  Theract: Demonstrated for patient what mirror therapy is and allowed her to try low-level activity to help her understand. Discussed what types of explicit motor imagery tasks will be beneficial for her to work through and what tasks are importatant to stay away from early on to help with plan development that is relevant to the patient.   Total time: 60 min.                          PT Education - 05/11/18 1057    Education provided  Yes    Education Details  See Pt. Instructions and Interventions this session    Person(s) Educated  Patient    Methods  Explanation;Demonstration;Tactile cues;Verbal cues;Handout    Comprehension  Verbalized understanding;Returned demonstration;Verbal cues required       PT Short Term Goals - 03/14/18 1834      PT SHORT TERM GOAL #1   Title  Pt will be able to Walk for 10 blocks without increase in pain to demonstrate improved activity tolerance.    Baseline  1 block    Time  6    Period  Weeks    Status  New    Target Date  04/25/18      PT SHORT TERM GOAL #2   Title  Pt. will be able to stand for 45 minutes without increased pain. to demonstrate improved function     Baseline  15 minutes    Time  6    Period  Weeks    Status  New    Target Date  04/25/18      PT SHORT TERM GOAL #3   Title  Pt. will get up no more than 6 times per night to go to the bathroom to improve quality of sleep and allow for healing and down-regulation of pain.    Time  6    Period  Weeks    Status  New    Target Date  04/25/18      PT SHORT TERM GOAL #4   Title  Patient will demonstrate  improved pelvic alignment and balance of musculature surrounding the pelvis to facilitate decreased PFM spasms and decrease pelvic pain.    Time  6    Period  Weeks    Status  New    Target Date  04/25/18        PT Long Term Goals - 03/14/18 1839      PT LONG TERM GOAL #1   Title  Patient will score less than or equal to 20% on the Pain Disability Index to demonstrate a decrease in disability from moderate to minimal.     Baseline  63/70 (90%)     Time  12    Period  Weeks    Status  New    Target Date  06/06/18      PT LONG TERM GOAL #2   Title  Patient will score less than or equal to 20% on the Female NIH-CPSI to demonstrate a reduction in pain, urinary symptoms, and an improved quality of life.    Baseline  35/43 (81%)    Time  12    Period  Weeks    Status  New    Target Date  06/06/18      PT LONG TERM GOAL #3   Title  Patient will be able to work a full day without a pain increase of greater than 2 from baseline.    Baseline  By the end of the week pain is so bad that she comes home and goes straight to bed, sleeps most of the weekend.    Time  12    Period  Weeks    Status  New    Target Date  06/06/18      PT LONG TERM GOAL #4   Title  Patient will be able to participate in activities with her family such as going to the fair or out to dinner and participate fully wothout being limited by pain.    Baseline  chooses not to go or can only participate at ~ 25% with family outings due to increased pain with standing and walking.    Time  12    Period  Weeks    Status  New    Target Date  06/06/18      PT LONG TERM GOAL #5   Title  Patient will report no pain with intercourse to demonstrate improved functional ability.    Baseline  has only had intercourse 1 time in past 3 years due to pain.    Time  12    Period  Weeks    Status  New    Target Date  06/06/18      Additional Long Term Goals   Additional Long Term Goals  Yes      PT LONG TERM GOAL #6   Title   Patient will report urinating 6-8 times per day  and 1 or less times at night over the course of the prior week to demonstrate decreased frequency.    Baseline  urinating ~ every 30-45 min. and ~12 times per night.    Time  12    Period  Weeks    Status  New    Target Date  06/06/18            Plan - 05/11/18 1100    Clinical Impression Statement  Pt. responded well to all interventions today, really demonstrating understanding of how GMI will help her recover. We limited her physical activity due to her just completing PT for her shoulder and the potential to over-do it with the CRPS and focused a lot of education and planning as well as some gentle Manual treatment to the non-effected side. Continue per  POC.    Clinical Presentation  Evolving    Clinical Decision Making  High    Rehab Potential  Fair    Clinical Impairments Affecting Rehab Potential  Chronic pain, athsma    PT Frequency  1x / week    PT Duration  12 weeks    PT Treatment/Interventions  ADLs/Self Care Home Management;Biofeedback;Aquatic Therapy;Electrical Stimulation;Moist Heat;Traction;Ultrasound;Balance training;Therapeutic exercise;Therapeutic activities;Functional mobility training;Neuromuscular re-education;Patient/family education;Scar mobilization;Manual techniques;Passive range of motion;Dry needling;Energy conservation;Taping    PT Next Visit Plan  Progress GMI, R sided DN or gentle STM     PT Home Exercise Plan  diaphragmatic breathing and pain science, graded activity, self light massage to decrease sensitivity, Orientate R/L discrimination, pool movement    Consulted and Agree with Plan of Care  Patient       Patient will benefit from skilled therapeutic intervention in order to improve the following deficits and impairments:  Abnormal gait, Increased fascial restricitons, Improper body mechanics, Pain, Cardiopulmonary status limiting activity, Decreased coordination, Decreased mobility, Decreased scar  mobility, Increased muscle spasms, Postural dysfunction, Decreased activity tolerance, Decreased endurance, Decreased range of motion, Decreased strength, Decreased balance, Difficulty walking, Impaired flexibility  Visit Diagnosis: Myalgia  Sacrococcygeal disorders, not elsewhere classified  Other muscle spasm  Muscular imbalance     Problem List Patient Active Problem List   Diagnosis Date Noted  . Kidney stone 02/20/2018  . Renal lesion 02/20/2018  . Benign hypertension 01/21/2018  . Pelvic pain 12/26/2017  . Depression, major, recurrent, moderate ([place]) 12/26/2017  . GERD (gastroesophageal reflux disease) 11/09/2017  . 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 Elevated C-reactive protein (CRP) 07/11/2017  . Right thyroid nodule 05/26/2017  . Mild hypercholesterolemia 05/06/2017  . Medication monitoring encounter 05/06/2017  . Generalized osteoarthritis of hand 12/13/2016  . Impingement syndrome of shoulder region, left 11/29/2016  . Heartburn 11/16/2016  . Pain, joint, multiple sites 05/07/2016  . Preventative health care 10/18/2015  . Elevated blood pressure reading 10/18/2015  . Breast cancer screening 10/17/2015  . Asthma   . Anxiety   . Depression   . Allergic rhinitis   . Insomnia   . Obesity   . Vitamin D deficiency disease   . Cystic thyroid nodule 01/13/2014   Willa Rough DPT, ATC Willa Rough 05/11/2018, 11:06 AM  [place] MAIN [facility] SERVICES [address] [place], [place], [address] Phone: [phone]   Fax:  [phone]  Name: Yolanda Pace MRN: [id number] Date of Birth: 06/04/1966

## 2018-05-11 ENCOUNTER — Ambulatory Visit
Admission: RE | Admit: 2018-05-11 | Discharge: 2018-05-11 | Disposition: A | Discharge status: Home / Self Care | Payer: BC Managed Care – PPO | Source: Ambulatory Visit | Attending: Family Medicine | Admitting: Family Medicine

## 2018-05-11 DIAGNOSIS — Z1231 Encounter for screening mammogram for malignant neoplasm of breast: Secondary | ICD-10-CM | POA: Diagnosis not present

## 2018-05-12 ENCOUNTER — Encounter: Payer: Self-pay | Admitting: Family Medicine

## 2018-05-12 ENCOUNTER — Ambulatory Visit: Payer: BC Managed Care – PPO | Admitting: Family Medicine

## 2018-05-12 VITALS — BP 118/72 | HR 88 | Temp 98.2°F | Resp 12 | Ht 63.0 in | Wt 184.3 lb

## 2018-05-12 DIAGNOSIS — M159 Polyosteoarthritis, unspecified: Secondary | ICD-10-CM

## 2018-05-12 DIAGNOSIS — R768 Other specified abnormal immunological findings in serum: Secondary | ICD-10-CM | POA: Insufficient documentation

## 2018-05-12 DIAGNOSIS — E559 Vitamin D deficiency, unspecified: Secondary | ICD-10-CM

## 2018-05-12 DIAGNOSIS — Z79899 Other long term (current) drug therapy: Secondary | ICD-10-CM

## 2018-05-12 DIAGNOSIS — M2550 Pain in unspecified joint: Secondary | ICD-10-CM

## 2018-05-12 DIAGNOSIS — R7982 Elevated C-reactive protein (CRP): Secondary | ICD-10-CM | POA: Diagnosis not present

## 2018-05-12 DIAGNOSIS — R7989 Other specified abnormal findings of blood chemistry: Secondary | ICD-10-CM

## 2018-05-12 MED ORDER — TRAMADOL HCL 50 MG PO TABS: 50.0000 mg | ORAL_TABLET | Freq: Four times a day (QID) | ORAL | 0 refills | Status: AC | PRN

## 2018-05-12 NOTE — Patient Instructions (Addendum)
Try cutting out all chocolate, caffeinate drinks, and tea for two weeks to see if that helps  Return in 3 months for regular visits for pain medicine, but I will refill in the meantime

## 2018-05-12 NOTE — Assessment & Plan Note (Signed)
Per office protocol, UDS, controlled substance agreement today; will start with 20 pills for pain, then extend to chronic pain, one pill per day, larger Rx if UDS consistent; reviewed PMP Aware PRIOR to prescribing

## 2018-05-12 NOTE — Assessment & Plan Note (Signed)
Patient is eager to find out why she has inflammation and hurts all over; she does not wish to return to previous rheumatologist; she agrees to get some additional labs today since none have been done in the last few months; consider seeing new rheum; she is considering having the mesh removed if that would make a difference, but she wants to make sure nothing else causing inflammation before thinking about surgery

## 2018-05-12 NOTE — Assessment & Plan Note (Signed)
rheumatoid arthritis is certainly a possibility; will recheck labs and refer to another rheumatologist

## 2018-05-12 NOTE — Assessment & Plan Note (Signed)
I hear her and acknowledged her pain; reviewed PMP Aware, consistent with her report; willing to take over prescribing pain medicine as we try to identify the etiology; she'll have Rx for one tramadol a day; will get UDS and have her sign controlled substance agreement per our office protocol; I do not think she is drug seeking; she agrees with plan; labs today and then refer to rheum

## 2018-05-12 NOTE — Progress Notes (Signed)
BP 118/72   Pulse 88   Temp 98.2 F (36.8 C)   Resp 12   Ht 5' 3" (1.6 m)   Wt 184 lb 4.8 oz (83.6 kg)   SpO2 95%   BMI 32.65 kg/m    Subjective:    Patient ID: Yolanda Pace, female    DOB: Feb 18, 1966, 52 y.o.   MRN: [id number]  HPI: Yolanda Pace is a 52 y.o. female  Chief Complaint  Patient presents with  . Follow-up    HPI She had the cystoscopy (on June 18th) and is already feeling better; less nighttime urinary frequency; does not feel like she has to urinate as much during the day; feels more urgency at night too; goes and gets in bed and then has to get up and pee again; does drink a little at night before going to bed; trying to drink more during the day and less at night  She saw Dr. Leafy Ro, she has the mesh and did say it was really red and irritated; she is doing pelvic floor PT here at [facility]; had mammogram  Fall Risk  05/12/2018 02/14/2018 01/31/2018 01/16/2018 12/26/2017  Falls in the past year? Yes No No No No  Number falls in past yr: 1 - - - -  Injury with Fall? Yes - - - -   Fell May 30th at work; she sprays tables and cleans them; custodian mopped up a spill; he walked away to get a sign, but she didn't see the spill, hit the wet floor and her feet went out; hit hard on the left side; she got checked out; no head injury  She saw the podiatrist; Dr. Jefm Bryant thought she had plantar fasciitis and she has had two injections for that; I asked if that helped; to be honest... It still hurts but only when she has a flare up; when she had the flare up, then the heel hurts (left); last visit with podiatrist  Was Apr 05, 2018  She has been communicating with her rheum; she took sulindac and sertraline; started to have blood in the stool, bladder felt swollen and more spasms; getting up 10-15 x at night to urinate; she saw PT and therapist suggested stopping the sulindac; she stopped sulindac and the bladder symptoms improved; asked if other medicines that can be  taken with sertraline; it may be best to not take anti-inflammatories, they responded; try tylenol OTC; that does nothing she says  She e-mailed him again, tremendous amount of pain in her joints; taking prescription sprix (nasal spray) for short-term pain relief; five days; NSAID; she takes a tramadol with the sprix and it helps her get through the work day; she explained that we were hoping for him to prescribe pain medicine (tramadol); Dr. Jefm Bryant recommended a referral to the pain clinic  She e-mailed again to see if any other tests that could be run to see why she was hurting so much; she says this is not who I am, missing work; she just wants to know what it wrong; she did not ask for pain medicine, just wanting answers as to why she is hurting so much; "I want my life back"; they asked her to schedule an appointment to see him; appt scheduled for Friday; then on Wednesday, she showed pelvic floor therapist the article about mesh causing inflammation; asked him to read over and discuss it at his appt on Friday; they called her on Thursday to cancel her Friday appointment because  he "isn't a mesh specialist"; she sent an e-mail to him which she read; she had done everything he asked her to do; after that e-mail, they asked her to reschedule her appointment; she says "no"  Depression screen [facility] 2/9 05/12/2018 02/14/2018 01/31/2018 01/16/2018 12/26/2017  Decreased Interest 0 0 0 1 1  Down, Depressed, Hopeless 0 0 _0 PHQ - 2 Score 0 0 _1 Altered sleeping 2 - _2 Tired, decreased energy 1 - _3 Change in appetite 3 - 0 1 1  Feeling bad or failure about yourself  0 - 0 0 2  Trouble concentrating 1 - _4 Moving slowly or fidgety/restless 0 - _5 Suicidal thoughts 0 - 0 0 0  PHQ-9 Score 7 - _6 Difficult doing work/chores Somewhat difficult - Extremely dIfficult Somewhat difficult Very difficult    Relevant past medical, surgical, family and social history reviewed Past Medical  History:  Diagnosis Date  . Allergic rhinitis   . Anxiety   . Arthritis   . Asthma    WELL CONTROLLED  . Complication of anesthesia    PT STATES THAT SHE WAS GIVEN SOME TYPE OF ANESTHESIA THAT MADE HER HAVE A SEIZURE IN 2015-NOTE IS IN CHART FROM [place] ANESTHESIA FROM 2015  . Cystic thyroid nodule March 2015   38m on u/s  . Depression   . GERD (gastroesophageal reflux disease)   . History of kidney stones    SMALL STONE CURRENTLY  . Hypertension   . Insomnia   . Joint pain 04/2018   PT SEEING RHEUMATOLOGIST FOR JOINT PAIN THAT OCCURS APP ONCE A MONTH AND IS SO BAD THAT SHE CANT GET OUT OF BED  . Obesity   . PONV (postoperative nausea and vomiting)   . Right thyroid nodule 05/26/2017   Solid 1 cm RIGHT lobe July 2018  . Seizures [place]   provoked during cervical nerve block  . Skin cancer Aug 2015   removed from back  . Vitamin D deficiency disease    Past Surgical History:  Procedure Laterality Date  . ABDOMINAL HYSTERECTOMY  2005ish   fibroids, ovaries remain; along with bladder tack  . bladder tack  2005ish  . CHOLECYSTECTOMY    . CYSTO WITH HYDRODISTENSION N/A 05/02/2018   Procedure: CYSTOSCOPY/HYDRODISTENSION;  Surgeon: WRoyston Cowper MD;  Location: [facility] ORS;  Service: Urology;  Laterality: N/A;  . SKIN CANCER EXCISION  Aug 2015   removed from back   Family History  Problem Relation Age of Onset  . Hyperlipidemia Mother   . Diabetes Sister   . Hyperlipidemia Sister   . Hypertension Sister   . Hypertension Brother   . Pneumonia Maternal Grandmother   . Heart attack Maternal Grandfather   . Diabetes Sister   . Hypertension Sister   . Epilepsy Sister   . Hypertension Sister   . Hypertension Daughter   . Kidney Stones Daughter   . Cancer Neg Hx   . COPD Neg Hx   . Heart disease Neg Hx   . Stroke Neg Hx    Social History   Tobacco Use  . Smoking status: Never Smoker  . Smokeless tobacco: Never Used  Substance Use Topics  . Alcohol use: No   . Drug use: No    Interim medical history since last visit reviewed. Allergies and medications reviewed  Review of Systems Per HPI unless  specifically indicated above     Objective:    BP 118/72   Pulse 88   Temp 98.2 F (36.8 C)   Resp 12   Ht 5' 3" (1.6 m)   Wt 184 lb 4.8 oz (83.6 kg)   SpO2 95%   BMI 32.65 kg/m   Wt Readings from Last 3 Encounters:  05/12/18 184 lb 4.8 oz (83.6 kg)  05/02/18 184 lb 14.4 oz (83.9 kg)  02/14/18 183 lb 9.6 oz (83.3 kg)    Physical Exam  Constitutional: She appears well-developed and well-nourished. No distress.  HENT:  Head: Normocephalic and atraumatic.  Eyes: EOM are normal. No scleral icterus.  Neck: No thyromegaly present.  Cardiovascular: Normal rate, regular rhythm and normal heart sounds.  No murmur heard. Pulmonary/Chest: Effort normal and breath sounds normal. No respiratory distress. She has no wheezes.  Abdominal: She exhibits no distension.  Musculoskeletal: Normal range of motion. She exhibits no edema.       Right hand: She exhibits tenderness. She exhibits no deformity.       Left hand: She exhibits tenderness. She exhibits no deformity.       Hands: Tender to palpation over the bases of the thumbs, LEFT >> RIGHT  Neurological: She is alert. She exhibits normal muscle tone.  Skin: Skin is warm and dry. She is not diaphoretic. No pallor.  Psychiatric: Her behavior is normal. Judgment and thought content normal. She exhibits a depressed mood (briefly tearful when dicussing her communications with specialist, feeling dismissed).  Good eye contact with examiner    Results for orders placed or performed in visit on 02/06/18  Lipid panel  Result Value Ref Range   Cholesterol 203 (H) <200 mg/dL   HDL 48 (L) >50 mg/dL   Triglycerides 154 (H) <150 mg/dL   LDL Cholesterol (Calc) 128 (H) mg/dL (calc)   Total CHOL/HDL Ratio 4.2 <5.0 (calc)   Non-HDL Cholesterol (Calc) 155 (H) <130 mg/dL (calc)  Magnesium  Result Value  Ref Range   Magnesium 2.2 1.5 - 2.5 mg/dL  Protein electrophoresis, serum  Result Value Ref Range   Total Protein 6.8 6.1 - 8.1 g/dL  ALT  Result Value Ref Range   ALT 20 6 - 29 U/L  TSH  Result Value Ref Range   TSH 1.47 mIU/L  T4, free  Result Value Ref Range   Free T4 1.2 0.8 - 1.8 ng/dL  Beta 2 microglobulin, serum  Result Value Ref Range   Beta-2 Microglobulin 2.06 < OR = 2.5 mg/L  Cortisol  Result Value Ref Range   Cortisol, Plasma 16.1 mcg/dL  FSH/LH  Result Value Ref Range   FSH 50.2 mIU/mL   LH 27.1 mIU/mL      Assessment & Plan:   Problem List Items Addressed This Visit      Musculoskeletal and Integument   Generalized osteoarthritis of hand   Relevant Medications   traMADol (ULTRAM) 50 MG tablet   Other Relevant Orders   C-reactive protein   Sed Rate (ESR)   ANA,IFA RA Diag Pnl w/rflx Tit/Patn   COMPLETE METABOLIC PANEL WITH GFR   CBC with Differential/Platelet   Urine Drug Screen w/Alc, no confirm     Other   Vitamin D deficiency disease    Check level, though I doubt this is the sole cause of her pain      Relevant Orders   VITAMIN D 25 Hydroxy (Vit-D Deficiency, Fractures)   Pain, joint, multiple sites    I  hear her and acknowledged her pain; reviewed PMP Aware, consistent with her report; willing to take over prescribing pain medicine as we try to identify the etiology; she'll have Rx for one tramadol a day; will get UDS and have her sign controlled substance agreement per our office protocol; I do not think she is drug seeking; she agrees with plan; labs today and then refer to rheum      Elevated C-reactive protein (CRP) - Primary    Patient is eager to find out why she has inflammation and hurts all over; she does not wish to return to previous rheumatologist; she agrees to get some additional labs today since none have been done in the last few months; consider seeing new rheum; she is considering having the mesh removed if that would make a  difference, but she wants to make sure nothing else causing inflammation before thinking about surgery      Relevant Orders   C-reactive protein   Sed Rate (ESR)   ANA,IFA RA Diag Pnl w/rflx Tit/Patn   COMPLETE METABOLIC PANEL WITH GFR   CBC with Differential/Platelet   Cyclic citrullinated peptide (CCP) antibody positive    rheumatoid arthritis is certainly a possibility; will recheck labs and refer to another rheumatologist      Controlled substance agreement signed    Per office protocol, UDS, controlled substance agreement today; will start with 20 pills for pain, then extend to chronic pain, one pill per day, larger Rx if UDS consistent; reviewed PMP Aware PRIOR to prescribing      Relevant Orders   Urine Drug Screen w/Alc, no confirm       Follow up plan: Return in about 1 month (around 06/11/2018) for follow-up visit with Dr. Sanda Klein.  An after-visit summary was printed and given to the patient at [place].  Please see the patient instructions which may contain other information and recommendations beyond what is mentioned above in the assessment and plan.  Meds ordered this encounter  Medications  . traMADol (ULTRAM) 50 MG tablet    Sig: Take 1 tablet (50 mg total) by mouth every 6 (six) hours as needed for up to 5 days.    Dispense:  20 tablet    Refill:  0    Orders Placed This Encounter  Procedures  . C-reactive protein  . Sed Rate (ESR)  . VITAMIN D 25 Hydroxy (Vit-D Deficiency, Fractures)  . ANA,IFA RA Diag Pnl w/rflx Tit/Patn  . COMPLETE METABOLIC PANEL WITH GFR  . CBC with Differential/Platelet  . Urine Drug Screen w/Alc, no confirm

## 2018-05-12 NOTE — Assessment & Plan Note (Signed)
Check level, though I doubt this is the sole cause of her pain

## 2018-05-16 ENCOUNTER — Telehealth: Payer: Self-pay | Admitting: Gastroenterology

## 2018-05-16 ENCOUNTER — Encounter: Payer: Self-pay | Admitting: Family Medicine

## 2018-05-16 DIAGNOSIS — R7982 Elevated C-reactive protein (CRP): Secondary | ICD-10-CM

## 2018-05-16 DIAGNOSIS — M2550 Pain in unspecified joint: Secondary | ICD-10-CM

## 2018-05-16 DIAGNOSIS — R7989 Other specified abnormal findings of blood chemistry: Secondary | ICD-10-CM

## 2018-05-16 DIAGNOSIS — R768 Other specified abnormal immunological findings in serum: Secondary | ICD-10-CM

## 2018-05-16 LAB — COMPLETE METABOLIC PANEL WITH GFR
AG Ratio: 1.8 (calc) (ref 1.0–2.5)
ALT: 22 U/L (ref 6–29)
AST: 15 U/L (ref 10–35)
Albumin: 4.4 g/dL (ref 3.6–5.1)
Alkaline phosphatase (APISO): 85 U/L (ref 33–130)
BILIRUBIN TOTAL: 0.8 mg/dL (ref 0.2–1.2)
BUN: 15 mg/dL (ref 7–25)
CALCIUM: 9.8 mg/dL (ref 8.6–10.4)
CHLORIDE: 106 mmol/L (ref 98–110)
CO2: 26 mmol/L (ref 20–32)
Creat: 0.63 mg/dL (ref 0.50–1.05)
GFR, EST AFRICAN AMERICAN: 120 mL/min/{1.73_m2} (ref >=60)
GFR, EST NON AFRICAN AMERICAN: 103 mL/min/{1.73_m2} (ref >=60)
Globulin: 2.5 g/dL (calc) (ref 1.9–3.7)
Glucose, Bld: 93 mg/dL (ref 65–139)
Potassium: 4.1 mmol/L (ref 3.5–5.3)
Sodium: 141 mmol/L (ref 135–146)
TOTAL PROTEIN: 6.9 g/dL (ref 6.1–8.1)

## 2018-05-16 LAB — DRUG SCREEN URINE W/ALC, NO CONF
ALCOHOL, ETHYL (U): NEGATIVE
AMPHETAMINES (1000 NG/ML SCRN): NEGATIVE
BARBITURATES: NEGATIVE
BENZODIAZEPINES: NEGATIVE
COCAINE METABOLITES: NEGATIVE
MARIJUANA MET (50 ng/mL SCRN): NEGATIVE
METHADONE: NEGATIVE
METHAQUALONE: NEGATIVE
OPIATES: NEGATIVE
PHENCYCLIDINE: NEGATIVE
PROPOXYPHENE: NEGATIVE

## 2018-05-16 LAB — VITAMIN D 25 HYDROXY (VIT D DEFICIENCY, FRACTURES): VIT D 25 HYDROXY: 33 ng/mL (ref 30–100)

## 2018-05-16 LAB — CBC WITH DIFFERENTIAL/PLATELET
BASOS ABS: 92 {cells}/uL (ref 0–200)
Basophils Relative: 1.3 %
EOS PCT: 2.5 %
Eosinophils Absolute: 178 cells/uL (ref 15–500)
HEMATOCRIT: 42.2 % (ref 35.0–45.0)
Hemoglobin: 14.1 g/dL (ref 11.7–15.5)
LYMPHS ABS: 2393 {cells}/uL (ref 850–3900)
MCH: 29.3 pg (ref 27.0–33.0)
MCHC: 33.4 g/dL (ref 32.0–36.0)
MCV: 87.7 fL (ref 80.0–100.0)
MPV: 9.5 fL (ref 7.5–12.5)
Monocytes Relative: 6.3 %
NEUTROS ABS: 3990 {cells}/uL (ref 1500–7800)
Neutrophils Relative %: 56.2 %
Platelets: 318 10*3/uL (ref 140–400)
RBC: 4.81 10*6/uL (ref 3.80–5.10)
RDW: 11.9 % (ref 11.0–15.0)
Total Lymphocyte: 33.7 %
WBC mixed population: 447 cells/uL (ref 200–950)
WBC: 7.1 10*3/uL (ref 3.8–10.8)

## 2018-05-16 LAB — ANA,IFA RA DIAG PNL W/RFLX TIT/PATN
Anti Nuclear Antibody(ANA): NEGATIVE
Cyclic Citrullin Peptide Ab: <16 UNITS

## 2018-05-16 LAB — SEDIMENTATION RATE: Sed Rate: 6 mm/h (ref 0–30)

## 2018-05-16 LAB — C-REACTIVE PROTEIN: CRP: 13.4 mg/L — ABNORMAL HIGH (ref <8.0)

## 2018-05-16 NOTE — Telephone Encounter (Signed)
Patient called and is ready to schedule procedure.

## 2018-05-22 NOTE — Telephone Encounter (Signed)
Please schedule the patient to come in to discuss  A surgery request has been placed.

## 2018-05-22 NOTE — Telephone Encounter (Signed)
lvm for pt.  °

## 2018-06-06 ENCOUNTER — Other Ambulatory Visit: Payer: Self-pay

## 2018-06-06 DIAGNOSIS — K625 Hemorrhage of anus and rectum: Secondary | ICD-10-CM

## 2018-06-06 DIAGNOSIS — R12 Heartburn: Secondary | ICD-10-CM

## 2018-06-06 DIAGNOSIS — Z1211 Encounter for screening for malignant neoplasm of colon: Secondary | ICD-10-CM

## 2018-06-06 MED ORDER — NA SULFATE-K SULFATE-MG SULF 17.5-3.13-1.6 GM/177ML PO SOLN: 1.0000 | Freq: Once | ORAL | 0 refills | Status: AC

## 2018-06-06 NOTE — Progress Notes (Signed)
gas

## 2018-06-06 NOTE — Progress Notes (Signed)
Office Visit Note  Patient: Yolanda Pace             Date of Birth: [birth year]           MRN: [id number]             PCP: Arnetha Courser, MD Referring: Arnetha Courser, MD Visit Date: 06/20/2018 Occupation: teacher's assistant  Subjective:  Pain in multiple joints and muscles  History of Present Illness: Yolanda Pace is a 52 y.o. female seen in consultation per request of her PCP.  According to patient she has had joint pain for multiple years.  She states in 2017 she went for a physical with to her PCP and was found to have elevated CRP.  She states in March 2019 she started having severe coccyx pain to the point she passed out at work.  Over time she her joint pain increased.  She states she has been having pain and discomfort in her bilateral shoulders, bilateral knees and bilateral ankles to the point she has difficulty walking.  She is also had plantar fasciitis for she had 2 cortisone injections one in May 2019 in June 2019 without much relief.  She describes joint swelling only in her bilateral CMC joints.  None of the other joints well.  She states she was seen by rheumatologist in [place] who started her on Plaquenil and diagnosed her with rheumatoid arthritis.  She states Plaquenil caused bleeding per rectum and not of nausea she discontinued the medication.  She was seen by another rheumatologist in [place] who diagnosed her with osteoarthritis and gave her sulindac.  She states sulindac caused a lot of bladder spasm and bleeding per rectum.  She states she stopped the medication.  She has frequent diarrhea and constipation.  She has a colonoscopy pending at this point.  She also states she has frequency of urination and bladder issues for which she has been seeing a urologist.  She has had a cystoscopy.  The coccyx pain persist.  She is also seeing physical therapist for pelvic floor dysfunction.  She has history of chronic insomnia and fatigue.  Activities of Daily Living:    Patient reports morning stiffness for 30 minutes.   Patient Reports nocturnal pain.  Difficulty dressing/grooming: Denies Difficulty climbing stairs: Reports Difficulty getting out of chair: Reports Difficulty using hands for taps, buttons, cutlery, and/or writing: Reports  Review of Systems  Constitutional: Positive for fatigue. Negative for night sweats, weight gain and weight loss.  HENT: Positive for mouth dryness. Negative for mouth sores, trouble swallowing, trouble swallowing and nose dryness.   Eyes: Positive for dryness. Negative for pain, redness and visual disturbance.  Respiratory: Negative for cough, hemoptysis, shortness of breath and difficulty breathing.   Cardiovascular: Positive for hypertension. Negative for chest pain, palpitations, irregular heartbeat and swelling in legs/feet.  Gastrointestinal: Positive for blood in stool, constipation and diarrhea.  Endocrine: Negative for increased urination.  Genitourinary: Negative for painful urination and vaginal dryness.  Musculoskeletal: Positive for arthralgias, joint pain, morning stiffness and muscle tenderness. Negative for joint swelling, myalgias, muscle weakness and myalgias.  Skin: Negative for color change, pallor, rash, hair loss, nodules/bumps, skin tightness, ulcers and sensitivity to sunlight.  Allergic/Immunologic: Negative for susceptible to infections.  Neurological: Negative for dizziness, numbness, headaches, memory loss, night sweats and weakness.  Hematological: Negative for swollen glands.  Psychiatric/Behavioral: Positive for depressed mood and sleep disturbance. The patient is nervous/anxious.     PMFS History:  Patient Active  Problem List   Diagnosis Date Noted  . Controlled substance agreement signed 05/12/2018  . 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 Kidney stone 02/20/2018  . Renal lesion 02/20/2018  . Benign hypertension 01/21/2018  . Pelvic pain 12/26/2017  .  Depression, major, recurrent, moderate ([place]) 12/26/2017  . GERD (gastroesophageal reflux disease) 11/09/2017  . 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 Elevated C-reactive protein (CRP) 07/11/2017  . Right thyroid nodule 05/26/2017  . Mild hypercholesterolemia 05/06/2017  . Medication monitoring encounter 05/06/2017  . Generalized osteoarthritis of hand 12/13/2016  . Impingement syndrome of shoulder region, left 11/29/2016  . Heartburn 11/16/2016  . Pain, joint, multiple sites 05/07/2016  . Preventative health care 10/18/2015  . Elevated blood pressure reading 10/18/2015  . Breast cancer screening 10/17/2015  . Asthma   . Anxiety   . Depression   . Allergic rhinitis   . Insomnia   . Obesity   . Vitamin D deficiency disease   . Cystic thyroid nodule 01/13/2014    Past Medical History:  Diagnosis Date  . Allergic rhinitis   . Anxiety   . Arthritis   . Asthma    WELL CONTROLLED  . Complication of anesthesia    PT STATES THAT SHE WAS GIVEN SOME TYPE OF ANESTHESIA THAT MADE HER HAVE A SEIZURE IN 2015-NOTE IS IN CHART FROM [place] ANESTHESIA FROM 2015  . Cystic thyroid nodule March 2015   71m on u/s  . Depression   . GERD (gastroesophageal reflux disease)   . History of kidney stones    SMALL STONE CURRENTLY  . Hypertension   . Insomnia   . Joint pain 04/2018   PT SEEING RHEUMATOLOGIST FOR JOINT PAIN THAT OCCURS APP ONCE A MONTH AND IS SO BAD THAT SHE CANT GET OUT OF BED  . Obesity   . PONV (postoperative nausea and vomiting)   . Right thyroid nodule 05/26/2017   Solid 1 cm RIGHT lobe July 2018  . Seizures ([place] June [place]   provoked during cervical nerve block  . Skin cancer Aug 2015   removed from back  . Vitamin D deficiency disease     Family History  Problem Relation Age of Onset  . Hyperlipidemia Mother   . Diabetes Sister   . Hyperlipidemia Sister   . Hypertension Sister   . Hypertension Brother   . Pneumonia Maternal Grandmother   . Heart attack  Maternal Grandfather   . Diabetes Sister   . Hypertension Sister   . Epilepsy Sister   . Hypertension Sister   . Hypertension Daughter   . Kidney Stones Daughter   . Cancer Neg Hx   . COPD Neg Hx   . Heart disease Neg Hx   . Stroke Neg Hx    Past Surgical History:  Procedure Laterality Date  . ABDOMINAL HYSTERECTOMY  2005ish   fibroids, ovaries remain; along with bladder tack  . bladder tack  2005ish  . CHOLECYSTECTOMY    . CYSTO WITH HYDRODISTENSION N/A 05/02/2018   Procedure: CYSTOSCOPY/HYDRODISTENSION;  Surgeon: WRoyston Cowper MD;  Location: [facility] ORS;  Service: Urology;  Laterality: N/A;  . SKIN CANCER EXCISION  Aug 2015   removed from back   Social History   Social History Narrative  . Not on file    Objective: Vital Signs: BP 128/71 (BP Location: Right Arm, Patient Position: Sitting, Cuff Size: Normal)   Pulse 71   Ht 5' 3"  (1.6 m)   Wt 187 lb (84.8 kg)  BMI 33.13 kg/m    Physical Exam  Constitutional: She is oriented to person, place, and time. She appears well-developed and well-nourished.  HENT:  Head: Normocephalic and atraumatic.  Eyes: Conjunctivae and EOM are normal.  Neck: Normal range of motion.  Cardiovascular: Normal rate, regular rhythm, normal heart sounds and intact distal pulses.  Pulmonary/Chest: Effort normal and breath sounds normal.  Abdominal: Soft. Bowel sounds are normal.  Lymphadenopathy:    She has no cervical adenopathy.  Neurological: She is alert and oriented to person, place, and time.  Skin: Skin is warm and dry. Capillary refill takes less than 2 seconds.  Psychiatric: She has a normal mood and affect. Her behavior is normal.  Nursing note and vitals reviewed.    Musculoskeletal Exam: C-spine thoracic lumbar spine good range of motion.  She has tenderness over bilateral gluteal region.  She had tenderness over bilateral trochanteric bursa.  She had tenderness over bilateral trapezius area.  She also has costochondral  tenderness.  She has discomfort lifting her left shoulder.  Elbow joints wrist joints were in good range of motion.  She has bilateral CMC thickening bilateral PIP and DIP thickening with no synovitis.  She had good range of motion of her hip joints and knee joints.  She has high arches.  She had mild tenderness over plantar fascia.  No Achilles tendinitis was noted.  CDAI Exam: No CDAI exam completed.   Investigation: Findings:  05/12/18: ANA-, RF<14, CCP<16, CRP 13.4, Sed rate 6, Vitamin D 33  Component     Latest Ref Rng & Units 05/12/2018  Anti Nuclear Antibody(ANA)     NEGATIVE NEGATIVE  RA Latex Turbid.     <14 IU/mL <97  Cyclic Citrullin Peptide Ab     UNITS <16  Interpretation        CRP     <8.0 mg/L 13.4 (H)  Sed Rate     0 - 30 mm/h 6  Vitamin D, 25-Hydroxy     30 - 100 ng/mL 33   Imaging: No results found.  Recent Labs: Lab Results  Component Value Date   WBC 7.1 05/12/2018   HGB 14.1 05/12/2018   PLT 318 05/12/2018   NA 141 05/12/2018   K 4.1 05/12/2018   CL 106 05/12/2018   CO2 26 05/12/2018   GLUCOSE 93 05/12/2018   BUN 15 05/12/2018   CREATININE 0.63 05/12/2018   BILITOT 0.8 05/12/2018   ALKPHOS 74 01/30/2018   AST 15 05/12/2018   ALT 22 05/12/2018   PROT 6.9 05/12/2018   ALBUMIN 4.1 01/30/2018   CALCIUM 9.8 05/12/2018   GFRAA 120 05/12/2018    Speciality Comments: No specialty comments available.  Procedures:  No procedures performed Allergies: Other; Codeine; Compazine  [prochlorperazine]; Contrast media [iodinated diagnostic agents]; Doxycycline; Iodine; and Sulfur   Assessment / Plan:     Visit Diagnoses: Polyarthralgia -she complains of pain in multiple joints without any joint swelling.  She had no synovitis on examination.  Her clinical findings are consistent with osteoarthritis in her hands and feet.  I offered x-rays which she declined.  All autoimmune work-up has been negative.  She is HLA-B27 positive but have no features of  sacroiliitis.  05/12/18: ANA-, RF<10, CCP<16, CRP 13.4, Sed rate 27, Vitamin D 33, Uric acid 5.0, Lyme -  Myalgia-she has generalized pain and discomfort.  She has positive tender points and hyperalgesia.  Her findings are consistent with fibromyalgia.  Good sleep hygiene and regular exercise was discussed.  I have also advised her to contact her PCP for the management of fibromyalgia.  I offered physical therapy which she declined.  Elevated C-reactive protein (CRP)-was mildly elevated although her sed rate was normal.  HLA B27 (HLA B27 positive)-I do not see any features of a spondyloarthropathy on examination.  If she develops any new symptoms she is supposed to notify me.  Impingement syndrome of shoulder region, left-she has limited range of motion of her shoulder.  Other medical problems are listed as follows:  Essential hypertension  History of asthma  Anxiety and depression-patient is on Zoloft for long time.  She may benefit from Cymbalta.  Primary insomnia good sleep hygiene was discussed.-  Mild hypercholesterolemia  Cystic thyroid nodule  Gastroesophageal reflux disease, esophagitis presence not specified  History of skin cancer  History of kidney stones   Orders: No orders of the defined types were placed in this encounter.  No orders of the defined types were placed in this encounter.   Face-to-face time spent with patient was 50 minutes. Greater than 50% of time was spent in counseling and coordination of care.  Follow-Up Instructions: Return if symptoms worsen or fail to improve, for Polyarthralgia, fibromyalgia.   Bo Merino, MD  Note - This record has been created using Editor, commissioning.  Chart creation errors have been sought, but may not always  have been located. Such creation errors do not reflect on  the standard of medical care.

## 2018-06-12 ENCOUNTER — Encounter: Payer: Self-pay | Admitting: Family Medicine

## 2018-06-12 ENCOUNTER — Ambulatory Visit: Payer: BC Managed Care – PPO | Admitting: Family Medicine

## 2018-06-12 ENCOUNTER — Telehealth: Payer: Self-pay | Admitting: Family Medicine

## 2018-06-12 DIAGNOSIS — R7989 Other specified abnormal findings of blood chemistry: Secondary | ICD-10-CM | POA: Diagnosis not present

## 2018-06-12 DIAGNOSIS — M2550 Pain in unspecified joint: Secondary | ICD-10-CM

## 2018-06-12 DIAGNOSIS — R768 Other specified abnormal immunological findings in serum: Secondary | ICD-10-CM

## 2018-06-12 DIAGNOSIS — R7982 Elevated C-reactive protein (CRP): Secondary | ICD-10-CM | POA: Diagnosis not present

## 2018-06-12 DIAGNOSIS — Z6832 Body mass index (BMI) 32.0-32.9, adult: Secondary | ICD-10-CM

## 2018-06-12 DIAGNOSIS — E6609 Other obesity due to excess calories: Secondary | ICD-10-CM | POA: Diagnosis not present

## 2018-06-12 MED ORDER — MAGIC MOUTHWASH W/LIDOCAINE: 5.0000 mL | Freq: Four times a day (QID) | ORAL | 0 refills | Status: DC | PRN

## 2018-06-12 MED ORDER — MAGIC MOUTHWASH: 5.0000 mL | Freq: Four times a day (QID) | ORAL | 0 refills | Status: DC | PRN

## 2018-06-12 NOTE — Telephone Encounter (Signed)
Left voicemail of rx to pharmacy

## 2018-06-12 NOTE — Telephone Encounter (Signed)
Let's do magic mouthwash without lidocaine Please call in to pharmacy (it won't let me prescribe normally)

## 2018-06-12 NOTE — Assessment & Plan Note (Signed)
Going to see rheum soon

## 2018-06-12 NOTE — Patient Instructions (Addendum)
Let me know how I can help Call for pain medicine refill if and when needed  Check out the information at familydoctor.org entitled "Nutrition for Weight Loss: What You Need to Know about Fad Diets" Try to lose between 1-2 pounds per week by taking in fewer calories and burning off more calories You can succeed by limiting portions, limiting foods dense in calories and fat, becoming more active, and drinking 8 glasses of water a day (64 ounces) Don't skip meals, especially breakfast, as skipping meals may alter your metabolism Do not use over-the-counter weight loss pills or gimmicks that claim rapid weight loss A healthy BMI (or body mass index) is between 18.5 and 24.9 You can calculate your ideal BMI at the [place] website ClubMonetize.fr   Preventing Unhealthy Weight Gain, Adult Staying at a healthy weight is important. When fat builds up in your body, you may become overweight or obese. These conditions put you at greater risk for developing certain health problems, such as heart disease, diabetes, sleeping problems, joint problems, and some cancers. Unhealthy weight gain is often the result of making unhealthy choices in what you eat. It is also a result of not getting enough exercise. You can make changes to your lifestyle to prevent obesity and stay as healthy as possible. What nutrition changes can be made? To maintain a healthy weight and prevent obesity:  Eat only as much as your body needs. To do this: ? Pay attention to signs that you are hungry or full. Stop eating as soon as you feel full. ? If you feel hungry, try drinking water first. Drink enough water so your urine is clear or pale yellow. ? Eat smaller portions. ? Look at serving sizes on food labels. Most foods contain more than one serving per container. ? Eat the recommended amount of calories for your gender and activity level. While most active people should eat around  2,000 calories per day, if you are trying to lose weight or are not very active, you main need to eat less calories. Talk to your health care provider or dietitian about how many calories you should eat each day.  Choose healthy foods, such as: ? Fruits and vegetables. Try to fill at least half of your plate at each meal with fruits and vegetables. ? Whole grains, such as whole wheat bread, brown rice, and quinoa. ? Lean meats, such as chicken or fish. ? Other healthy proteins, such as beans, eggs, or tofu. ? Healthy fats, such as nuts, seeds, fatty fish, and olive oil. ? Low-fat or fat-free dairy.  Check food labels and avoid food and drinks that: ? Are high in calories. ? Have added sugar. ? Are high in sodium. ? Have saturated fats or trans fats.  Limit how much you eat of the following foods: ? Prepackaged meals. ? Fast food. ? Fried foods. ? Processed meat, such as bacon, sausage, and deli meats. ? Fatty cuts of red meat and poultry with skin.  Cook foods in healthier ways, such as by baking, broiling, or grilling.  When grocery shopping, try to shop around the outside of the store. This helps you buy mostly fresh foods and avoid canned and prepackaged foods.  What lifestyle changes can be made?  Exercise at least 30 minutes 5 or more days each week. Exercising includes brisk walking, yard work, biking, running, swimming, and team sports like basketball and soccer. Ask your health care provider which exercises are safe for you.  Do not use  any products that contain nicotine or tobacco, such as cigarettes and e-cigarettes. If you need help quitting, ask your health care provider.  Limit alcohol intake to no more than 1 drink a day for nonpregnant women and 2 drinks a day for men. One drink equals 12 oz of beer, 5 oz of wine, or 1 oz of hard liquor.  Try to get 7-9 hours of sleep each night. What other changes can be made?  Keep a food and activity journal to keep track  of: ? What you ate and how many calories you had. Remember to count sauces, dressings, and side dishes. ? Whether you were active, and what exercises you did. ? Your calorie, weight, and activity goals.  Check your weight regularly. Track any changes. If you notice you have gained weight, make changes to your diet or activity routine.  Avoid taking weight-loss medicines or supplements. Talk to your health care provider before starting any new medicine or supplement.  Talk to your health care provider before trying any new diet or exercise plan. Why are these changes important? Eating healthy, staying active, and having healthy habits not only help prevent obesity, they also:  Help you to manage stress and emotions.  Help you to connect with friends and family.  Improve your self-esteem.  Improve your sleep.  Prevent long-term health problems.  What can happen if changes are not made? Being obese or overweight can cause you to develop joint or bone problems, which can make it hard for you to stay active or do activities you enjoy. Being obese or overweight also puts stress on your heart and lungs and can lead to health problems like diabetes, heart disease, and some cancers. Where to find more information: Talk with your health care provider or a dietitian about healthy eating and healthy lifestyle choices. You may also find other information through these resources:  U.S. Department of Agriculture MyPlate: FormerBoss.no  American Heart Association: www.heart.org  Centers for Disease Control and Prevention: http://www.wolf.info/  Summary  Staying at a healthy weight is important. It helps prevent certain diseases and health problems, such as heart disease, diabetes, joint problems, sleep disorders, and some cancers.  Being obese or overweight can cause you to develop joint or bone problems, which can make it hard for you to stay active or do activities you enjoy.  You can prevent  unhealthy weight gain by eating a healthy diet, exercising regularly, not smoking, limiting alcohol, and getting enough sleep.  Talk with your health care provider or a dietitian for guidance about healthy eating and healthy lifestyle choices. This information is not intended to replace advice given to you by your health care provider. Make sure you discuss any questions you have with your health care provider. Document Released: 11/02/2016 Document Revised: 12/08/2016 Document Reviewed: 12/08/2016 Elsevier Interactive Patient Education  [employer].

## 2018-06-12 NOTE — Progress Notes (Signed)
BP 132/84   Pulse 69   Temp 98.3 F (36.8 C) (Oral)   Resp 12   Ht 5' 3"  (1.6 m)   Wt 184 lb (83.5 kg)   SpO2 94%   BMI 32.59 kg/m    Subjective:    Patient ID: Yolanda Pace, female    DOB: Feb 27, 1966, 52 y.o.   MRN: [id number]  HPI: Yolanda Pace is a 52 y.o. female  Chief Complaint  Patient presents with  . Follow-up    HPI Patient is here for f/u for pelvic pain and joint pain pain has been going on for over a year. Seen rheumatologist and put on sulindac 284m BID- states was on it for 3 weeks but unable to tolerate it. Had also tried ibuprofen but taken off that due to hypertension related to medication. States was switched to tramadol last visit. States takes is very sparingly- varies on pain level. States right now she is off work so she can rest when she needs to but when she is working more she needs it. States now taking it 1-2 pills a week. Seeing a new rheumatologist next week and then has colonoscopy scheduled the following week. 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, no high; she has not tried cymbalta; they have not said it is fibromyalgia yet; still trying to figure it out  CRP was elevated Vitamin D was normal  She is doing pelvic floor PT, but had to skip for a bit because of the UTI; mesh and bladder sling[facility]  In quite a bit of pain right now, BP pretty good considering  [facility] 06/12/2018 05/12/2018 02/14/2018 01/31/2018 01/16/2018  Decreased Interest 1 0 0 0 1  Down, Depressed, Hopeless 1 0 0 3 1  PHQ - 2 Score 2 0 0 3 2  Altered sleeping 3 2 - 3 1  Tired, decreased energy 1 1 - 3 1  Change in appetite 0 3 - 0 1  Feeling bad or failure about yourself  0 0 - 0 0  Trouble concentrating 1 1  - 3 3  Moving slowly or fidgety/restless 0 0 - 1 1  Suicidal thoughts 0 0 - 0 0  PHQ-9 Score 7 7 - 13 9  Difficult doing work/chores Not difficult at all Somewhat difficult - Extremely dIfficult Somewhat difficult    Relevant past medical, surgical, family and social history reviewed Past Medical History:  Diagnosis Date  . Allergic rhinitis   . Anxiety   . Arthritis   . Asthma    WELL CONTROLLED  . Complication of anesthesia    PT STATES THAT SHE WAS GIVEN SOME TYPE OF ANESTHESIA THAT MADE HER HAVE A SEIZURE IN 2015-[place]  . Cystic thyroid nodule March 2015   374mon u/s  . Depression   . GERD (gastroesophageal reflux disease)   . History of kidney stones    SMALL STONE CURRENTLY  . Hypertension   . Insomnia   . Joint pain 04/2018   PT SEEING RHEUMATOLOGIST FOR JOINT PAIN THAT OCCURS APP ONCE A MONTH AND IS SO BAD THAT SHE CANT GET OUT OF BED  .  Obesity   . PONV (postoperative nausea and vomiting)   . Right thyroid nodule 05/26/2017   Solid 1 cm RIGHT lobe July 2018  . Seizures ([place]) June 2015   provoked during cervical nerve block  . Skin cancer Aug 2015   removed from back  . Vitamin D deficiency disease    Past Surgical History:  Procedure Laterality Date  . ABDOMINAL HYSTERECTOMY  2005ish   fibroids, ovaries remain; along with bladder tack  . bladder tack  2005ish  . CHOLECYSTECTOMY    . CYSTO WITH HYDRODISTENSION N/A 05/02/2018   Procedure: CYSTOSCOPY/HYDRODISTENSION;  Surgeon: Royston Cowper, MD;  Location: [facility] ORS;  Service: Urology;  Laterality: N/A;  . SKIN CANCER EXCISION  Aug 2015   removed from back   Family History  Problem Relation Age of Onset  . Hyperlipidemia Mother   . Diabetes Sister   . Hyperlipidemia Sister   . Hypertension Sister   . Hypertension Brother   . Pneumonia Maternal Grandmother   . Heart attack Maternal Grandfather   . Diabetes Sister   . Hypertension Sister   . Epilepsy Sister    . Hypertension Sister   . Hypertension Daughter   . Kidney Stones Daughter   . Cancer Neg Hx   . COPD Neg Hx   . Heart disease Neg Hx   . Stroke Neg Hx    Social History   Tobacco Use  . Smoking status: Never Smoker  . Smokeless tobacco: Never Used  Substance Use Topics  . Alcohol use: No  . Drug use: No    Interim medical history since last visit reviewed. Allergies and medications reviewed  Review of Systems  Constitutional: Positive for fatigue (chronic fatigue, not more than baseline). Negative for activity change and appetite change.  Respiratory: Negative for cough and shortness of breath.   Cardiovascular: Negative for chest pain.  Gastrointestinal: Positive for abdominal pain (states feels like intestines are quivering and goes up to epigastric area) and diarrhea (states alternated between diarrhea and constipation). Negative for nausea and vomiting.  Neurological: Negative for dizziness, weakness and headaches.  Psychiatric/Behavioral: Positive for sleep disturbance (states has some insomnia). The patient is not nervous/anxious.    Per HPI unless specifically indicated above     Objective:    BP 132/84   Pulse 69   Temp 98.3 F (36.8 C) (Oral)   Resp 12   Ht 5' 3"  (1.6 m)   Wt 184 lb (83.5 kg)   SpO2 94%   BMI 32.59 kg/m   Wt Readings from Last 3 Encounters:  06/12/18 184 lb (83.5 kg)  05/12/18 184 lb 4.8 oz (83.6 kg)  05/02/18 184 lb 14.4 oz (83.9 kg)    Physical Exam  Constitutional: She appears well-developed and well-nourished.  HENT:  Mouth/Throat: Mucous membranes are normal. No posterior oropharyngeal edema or posterior oropharyngeal erythema.  Fine tiny papular lesions posterior oropharynx, soft palate  Eyes: EOM are normal. No scleral icterus.  Cardiovascular: Normal rate and regular rhythm.  Pulmonary/Chest: Effort normal and breath sounds normal.  Musculoskeletal:       Right forearm: She exhibits tenderness (over right lateral elbow  fibro pressure point).  Psychiatric: She has a normal mood and affect. Her behavior is normal. Her mood appears not anxious. She does not exhibit a depressed mood.  Good eye contact with examiner    Results for orders placed or performed in visit on 05/12/18  C-reactive protein  Result Value Ref Range   CRP  13.4 (H) <8.0 mg/L  Sed Rate (ESR)  Result Value Ref Range   Sed Rate 6 0 - 30 mm/h  VITAMIN D 25 Hydroxy (Vit-D Deficiency, Fractures)  Result Value Ref Range   Vit D, 25-Hydroxy 33 30 - 100 ng/mL  ANA,IFA RA Diag Pnl w/rflx Tit/Patn  Result Value Ref Range   Anti Nuclear Antibody(ANA) NEGATIVE NEGATIVE   Rhuematoid fact SerPl-aCnc <46 <96 IU/mL   Cyclic Citrullin Peptide Ab <16 UNITS   INTERPRETATION    COMPLETE METABOLIC PANEL WITH GFR  Result Value Ref Range   Glucose, Bld 93 65 - 139 mg/dL   BUN 15 7 - 25 mg/dL   Creat 0.63 0.50 - 1.05 mg/dL   GFR, Est Non African American 103 > OR = 60 mL/min/1.10m   GFR, Est African American 120 > OR = 60 mL/min/1.778m  BUN/Creatinine Ratio NOT APPLICABLE 6 - 22 (calc)   Sodium 141 135 - 146 mmol/L   Potassium 4.1 3.5 - 5.3 mmol/L   Chloride 106 98 - 110 mmol/L   CO2 26 20 - 32 mmol/L   Calcium 9.8 8.6 - 10.4 mg/dL   Total Protein 6.9 6.1 - 8.1 g/dL   Albumin 4.4 3.6 - 5.1 g/dL   Globulin 2.5 1.9 - 3.7 g/dL (calc)   AG Ratio 1.8 1.0 - 2.5 (calc)   Total Bilirubin 0.8 0.2 - 1.2 mg/dL   Alkaline phosphatase (APISO) 85 33 - 130 U/L   AST 15 10 - 35 U/L   ALT 22 6 - 29 U/L  CBC with Differential/Platelet  Result Value Ref Range   WBC 7.1 3.8 - 10.8 Thousand/uL   RBC 4.81 3.80 - 5.10 Million/uL   Hemoglobin 14.1 11.7 - 15.5 g/dL   HCT 42.2 35.0 - 45.0 %   MCV 87.7 80.0 - 100.0 fL   MCH 29.3 27.0 - 33.0 pg   MCHC 33.4 32.0 - 36.0 g/dL   RDW 11.9 11.0 - 15.0 %   Platelets 318 140 - 400 Thousand/uL   MPV 9.5 7.5 - 12.5 fL   Neutro Abs 3,990 1,500 - 7,800 cells/uL   Lymphs Abs 2,393 850 - 3,900 cells/uL   WBC mixed  population 447 200 - 950 cells/uL   Eosinophils Absolute 178 15 - 500 cells/uL   Basophils Absolute 92 0 - 200 cells/uL   Neutrophils Relative % 56.2 %   Total Lymphocyte 33.7 %   Monocytes Relative 6.3 %   Eosinophils Relative 2.5 %   Basophils Relative 1.3 %  Urine Drug Screen w/Alc, no confirm  Result Value Ref Range   Please note     AMPHETAMINES (1000 ng/mL SCRN) NEGATIVE    BARBITURATES NEGATIVE    BENZODIAZEPINES NEGATIVE    COCAINE METABOLITES NEGATIVE    MARIJUANA MET (50 ng/mL SCRN) NEGATIVE    METHADONE NEGATIVE    METHAQUALONE NEGATIVE    OPIATES NEGATIVE    PHENCYCLIDINE NEGATIVE    PROPOXYPHENE NEGATIVE    ALCOHOL, ETHYL (U) NEGATIVE       Assessment & Plan:   Problem List Items Addressed This Visit      Other   Pain, joint, multiple sites    Going to see rheumatologist soon; believe she has fibromyalgia, but may also have seronegative RA or mixed connective tissue disease; I am willing to prescribe her tramadol now; hopeful that rheum may find something and get her pain under better control      Obesity (Chronic)    Stable; her  ability to exercise is limited by her pain      Relevant Medications   magic mouthwash w/lidocaine SOLN   Elevated C-reactive protein (CRP)    Going to see rheum soon      Cyclic citrullinated peptide (CCP) antibody positive    Going to see rheum soon          Follow up plan: Return in about 3 months (around 09/12/2018) for follow-up visit with Dr. Sanda Klein.  An after-visit summary was printed and given to the patient at [place].  Please see the patient instructions which may contain other information and recommendations beyond what is mentioned above in the assessment and plan.  Meds ordered this encounter  Medications  . magic mouthwash w/lidocaine SOLN    Sig: Take 5 mLs by mouth 4 (four) times daily as needed for mouth pain. Gargle and spit    Dispense:  120 mL    Refill:  0    No orders of the defined types were  placed in this encounter.

## 2018-06-12 NOTE — Telephone Encounter (Signed)
Copied from [place] [id number]. Topic: Quick Communication - See Telephone Encounter >> Jun 12, 2018 12:16 PM Nils Flack wrote: CRM for notification. See Telephone encounter for: 06/12/18.   Pharm called - they got rx for magic mouthwash w Lidocaine.  They need to know how much lidocaine to put in it.   Please call [phone], ask for [facility]

## 2018-06-12 NOTE — Assessment & Plan Note (Signed)
Going to see rheumatologist soon; believe she has fibromyalgia, but may also have seronegative RA or mixed connective tissue disease; I am willing to prescribe her tramadol now; hopeful that rheum may find something and get her pain under better control

## 2018-06-12 NOTE — Assessment & Plan Note (Signed)
Stable; her ability to exercise is limited by her pain

## 2018-06-16 ENCOUNTER — Encounter: Payer: Self-pay | Admitting: Gastroenterology

## 2018-06-19 ENCOUNTER — Other Ambulatory Visit: Payer: Self-pay | Admitting: Family Medicine

## 2018-06-19 DIAGNOSIS — G894 Chronic pain syndrome: Secondary | ICD-10-CM | POA: Insufficient documentation

## 2018-06-19 DIAGNOSIS — C4490 Unspecified malignant neoplasm of skin, unspecified: Secondary | ICD-10-CM | POA: Insufficient documentation

## 2018-06-20 ENCOUNTER — Ambulatory Visit: Payer: BC Managed Care – PPO | Admitting: Rheumatology

## 2018-06-20 ENCOUNTER — Encounter: Payer: Self-pay | Admitting: Rheumatology

## 2018-06-20 ENCOUNTER — Encounter: Payer: Self-pay | Admitting: Family Medicine

## 2018-06-20 VITALS — BP 128/71 | HR 71 | Ht 63.0 in | Wt 187.0 lb

## 2018-06-20 DIAGNOSIS — F32A Depression, unspecified: Secondary | ICD-10-CM

## 2018-06-20 DIAGNOSIS — F329 Major depressive disorder, single episode, unspecified: Secondary | ICD-10-CM

## 2018-06-20 DIAGNOSIS — M2550 Pain in unspecified joint: Secondary | ICD-10-CM | POA: Diagnosis not present

## 2018-06-20 DIAGNOSIS — Z85828 Personal history of other malignant neoplasm of skin: Secondary | ICD-10-CM

## 2018-06-20 DIAGNOSIS — Z87442 Personal history of urinary calculi: Secondary | ICD-10-CM

## 2018-06-20 DIAGNOSIS — E7800 Pure hypercholesterolemia, unspecified: Secondary | ICD-10-CM

## 2018-06-20 DIAGNOSIS — R7982 Elevated C-reactive protein (CRP): Secondary | ICD-10-CM

## 2018-06-20 DIAGNOSIS — I10 Essential (primary) hypertension: Secondary | ICD-10-CM

## 2018-06-20 DIAGNOSIS — F5101 Primary insomnia: Secondary | ICD-10-CM

## 2018-06-20 DIAGNOSIS — Z8709 Personal history of other diseases of the respiratory system: Secondary | ICD-10-CM

## 2018-06-20 DIAGNOSIS — Z1589 Genetic susceptibility to other disease: Secondary | ICD-10-CM | POA: Diagnosis not present

## 2018-06-20 DIAGNOSIS — M7542 Impingement syndrome of left shoulder: Secondary | ICD-10-CM | POA: Diagnosis not present

## 2018-06-20 DIAGNOSIS — E041 Nontoxic single thyroid nodule: Secondary | ICD-10-CM

## 2018-06-20 DIAGNOSIS — F419 Anxiety disorder, unspecified: Secondary | ICD-10-CM

## 2018-06-20 DIAGNOSIS — K219 Gastro-esophageal reflux disease without esophagitis: Secondary | ICD-10-CM

## 2018-06-20 MED ORDER — DULOXETINE HCL 30 MG PO CPEP: 30.0000 mg | ORAL_CAPSULE | Freq: Every day | ORAL | 0 refills | Status: DC

## 2018-06-21 ENCOUNTER — Encounter: Payer: Self-pay | Admitting: Student

## 2018-06-22 ENCOUNTER — Ambulatory Visit: Payer: BC Managed Care – PPO | Admitting: Registered Nurse

## 2018-06-22 ENCOUNTER — Ambulatory Visit
Admission: RE | Admit: 2018-06-22 | Discharge: 2018-06-22 | Disposition: A | Discharge status: Home / Self Care | Payer: BC Managed Care – PPO | Source: Ambulatory Visit | Attending: Gastroenterology | Admitting: Gastroenterology

## 2018-06-22 ENCOUNTER — Encounter
Admission: RE | Disposition: A | Discharge status: Home / Self Care | Payer: Self-pay | Source: Ambulatory Visit | Attending: Gastroenterology

## 2018-06-22 ENCOUNTER — Encounter: Payer: Self-pay | Admitting: *Deleted

## 2018-06-22 ENCOUNTER — Other Ambulatory Visit: Payer: Self-pay

## 2018-06-22 DIAGNOSIS — K921 Melena: Secondary | ICD-10-CM | POA: Diagnosis not present

## 2018-06-22 DIAGNOSIS — K648 Other hemorrhoids: Secondary | ICD-10-CM

## 2018-06-22 DIAGNOSIS — K625 Hemorrhage of anus and rectum: Secondary | ICD-10-CM

## 2018-06-22 DIAGNOSIS — R569 Unspecified convulsions: Secondary | ICD-10-CM | POA: Insufficient documentation

## 2018-06-22 DIAGNOSIS — M1990 Unspecified osteoarthritis, unspecified site: Secondary | ICD-10-CM | POA: Diagnosis not present

## 2018-06-22 DIAGNOSIS — Z6832 Body mass index (BMI) 32.0-32.9, adult: Secondary | ICD-10-CM | POA: Insufficient documentation

## 2018-06-22 DIAGNOSIS — K317 Polyp of stomach and duodenum: Secondary | ICD-10-CM

## 2018-06-22 DIAGNOSIS — J45909 Unspecified asthma, uncomplicated: Secondary | ICD-10-CM | POA: Insufficient documentation

## 2018-06-22 DIAGNOSIS — K2950 Unspecified chronic gastritis without bleeding: Secondary | ICD-10-CM | POA: Insufficient documentation

## 2018-06-22 DIAGNOSIS — F329 Major depressive disorder, single episode, unspecified: Secondary | ICD-10-CM | POA: Insufficient documentation

## 2018-06-22 DIAGNOSIS — Z882 Allergy status to sulfonamides status: Secondary | ICD-10-CM | POA: Diagnosis not present

## 2018-06-22 DIAGNOSIS — K219 Gastro-esophageal reflux disease without esophagitis: Secondary | ICD-10-CM | POA: Diagnosis not present

## 2018-06-22 DIAGNOSIS — M797 Fibromyalgia: Secondary | ICD-10-CM | POA: Insufficient documentation

## 2018-06-22 DIAGNOSIS — E568 Deficiency of other vitamins: Secondary | ICD-10-CM | POA: Diagnosis not present

## 2018-06-22 DIAGNOSIS — K3189 Other diseases of stomach and duodenum: Secondary | ICD-10-CM | POA: Diagnosis not present

## 2018-06-22 DIAGNOSIS — Z1211 Encounter for screening for malignant neoplasm of colon: Secondary | ICD-10-CM

## 2018-06-22 DIAGNOSIS — D125 Benign neoplasm of sigmoid colon: Secondary | ICD-10-CM | POA: Diagnosis not present

## 2018-06-22 DIAGNOSIS — E669 Obesity, unspecified: Secondary | ICD-10-CM | POA: Insufficient documentation

## 2018-06-22 DIAGNOSIS — F419 Anxiety disorder, unspecified: Secondary | ICD-10-CM | POA: Diagnosis not present

## 2018-06-22 DIAGNOSIS — Z91041 Radiographic dye allergy status: Secondary | ICD-10-CM | POA: Diagnosis not present

## 2018-06-22 DIAGNOSIS — I10 Essential (primary) hypertension: Secondary | ICD-10-CM | POA: Diagnosis not present

## 2018-06-22 DIAGNOSIS — D123 Benign neoplasm of transverse colon: Secondary | ICD-10-CM | POA: Diagnosis not present

## 2018-06-22 DIAGNOSIS — E559 Vitamin D deficiency, unspecified: Secondary | ICD-10-CM | POA: Insufficient documentation

## 2018-06-22 DIAGNOSIS — G4700 Insomnia, unspecified: Secondary | ICD-10-CM | POA: Diagnosis not present

## 2018-06-22 DIAGNOSIS — R12 Heartburn: Secondary | ICD-10-CM

## 2018-06-22 HISTORY — PX: ESOPHAGOGASTRODUODENOSCOPY (EGD) WITH PROPOFOL: SHX5813

## 2018-06-22 HISTORY — PX: COLONOSCOPY WITH PROPOFOL: SHX5780

## 2018-06-22 HISTORY — DX: Fibromyalgia: M79.7

## 2018-06-22 SURGERY — COLONOSCOPY WITH PROPOFOL
Anesthesia: General

## 2018-06-22 MED ORDER — FENTANYL CITRATE (PF) 100 MCG/2ML IJ SOLN
INTRAMUSCULAR | Status: AC
  Filled 2018-06-22: qty 2

## 2018-06-22 MED ORDER — MIDAZOLAM HCL 2 MG/2ML IJ SOLN
INTRAMUSCULAR | Status: DC | PRN
  Administered 2018-06-22: 2 mg via INTRAVENOUS

## 2018-06-22 MED ORDER — GLYCOPYRROLATE 0.2 MG/ML IJ SOLN
INTRAMUSCULAR | Status: DC | PRN
  Administered 2018-06-22: 0.2 mg via INTRAVENOUS

## 2018-06-22 MED ORDER — PROPOFOL 10 MG/ML IV BOLUS
INTRAVENOUS | Status: AC
  Filled 2018-06-22: qty 20

## 2018-06-22 MED ORDER — MIDAZOLAM HCL 2 MG/2ML IJ SOLN
INTRAMUSCULAR | Status: AC
  Filled 2018-06-22: qty 2

## 2018-06-22 MED ORDER — FENTANYL CITRATE (PF) 100 MCG/2ML IJ SOLN
INTRAMUSCULAR | Status: DC | PRN
  Administered 2018-06-22 (×2): 25 ug via INTRAVENOUS

## 2018-06-22 MED ORDER — PROPOFOL 500 MG/50ML IV EMUL
INTRAVENOUS | Status: DC | PRN
  Administered 2018-06-22: 15 ug/kg/min via INTRAVENOUS

## 2018-06-22 MED ORDER — PROPOFOL 500 MG/50ML IV EMUL
INTRAVENOUS | Status: AC
  Filled 2018-06-22: qty 50

## 2018-06-22 MED ORDER — PROPOFOL 10 MG/ML IV BOLUS
INTRAVENOUS | Status: DC | PRN
  Administered 2018-06-22: 100 mg via INTRAVENOUS

## 2018-06-22 MED ORDER — LIDOCAINE HCL (PF) 2 % IJ SOLN
INTRAMUSCULAR | Status: AC
  Filled 2018-06-22: qty 10

## 2018-06-22 MED ORDER — SODIUM CHLORIDE 0.9 % IV SOLN
INTRAVENOUS | Status: DC
  Administered 2018-06-22: 10:00:00 via INTRAVENOUS

## 2018-06-22 MED ORDER — LIDOCAINE HCL (PF) 1 % IJ SOLN
INTRAMUSCULAR | Status: AC
  Filled 2018-06-22: qty 2

## 2018-06-22 NOTE — Anesthesia Preprocedure Evaluation (Signed)
Anesthesia Evaluation  Patient identified by MRN, date of birth, ID band Patient awake    Reviewed: Allergy & Precautions, H&P , NPO status , Patient's Chart, lab work & pertinent test results, reviewed documented beta blocker date and time   History of Anesthesia Complications (+) PONV and history of anesthetic complications  Airway Mallampati: II  TM Distance: >3 FB Neck ROM: full    Dental  (+) Teeth Intact, Caps, Dental Advidsory Given   Pulmonary neg shortness of breath, asthma , neg sleep apnea, neg COPD, neg recent URI,           Cardiovascular Exercise Tolerance: Good hypertension, On Medications (-) angina(-) CAD, (-) Past MI, (-) Cardiac Stents and (-) CABG (-) dysrhythmias (-) Valvular Problems/Murmurs     Neuro/Psych Seizures -, Well Controlled,  PSYCHIATRIC DISORDERS Anxiety Depression  Neuromuscular disease (fibromyalgia) negative psych ROS   GI/Hepatic Neg liver ROS, GERD  Medicated,  Endo/Other  negative endocrine ROS  Renal/GU Renal disease (kidney stones)  negative genitourinary   Musculoskeletal   Abdominal   Peds  Hematology negative hematology ROS (+)   Anesthesia Other Findings Past Medical History: No date: Allergic rhinitis No date: Anxiety No date: Arthritis No date: Asthma     Comment:  WELL CONTROLLED No date: Complication of anesthesia     Comment:  PT STATES THAT SHE WAS GIVEN SOME TYPE OF ANESTHESIA               THAT MADE HER HAVE A SEIZURE IN 2015-NOTE IS IN CHART               FROM [place] ANESTHESIA FROM 2015 March 2015: Cystic thyroid nodule     Comment:  64mm on u/s No date: Depression No date: Fibromyalgia No date: GERD (gastroesophageal reflux disease) No date: History of kidney stones     Comment:  SMALL STONE CURRENTLY No date: Hypertension No date: Insomnia 04/2018: Joint pain     Comment:  PT SEEING RHEUMATOLOGIST FOR JOINT PAIN THAT OCCURS APP               ONCE  A MONTH AND IS SO BAD THAT SHE CANT GET OUT OF BED No date: Obesity No date: PONV (postoperative nausea and vomiting) 05/26/2017: Right thyroid nodule     Comment:  Solid 1 cm RIGHT lobe July 2018 June 2015: Seizures [facility])     Comment:  provoked during cervical nerve block Aug 2015: Skin cancer     Comment:  removed from back No date: Vitamin D deficiency disease   Reproductive/Obstetrics negative OB ROS                             Anesthesia Physical  Anesthesia Plan  ASA: II  Anesthesia Plan: General   Post-op Pain Management:    Induction: Intravenous  PONV Risk Score and Plan: 4 or greater and Propofol infusion and TIVA  Airway Management Planned: Nasal Cannula  Additional Equipment:   Intra-op Plan:   Post-operative Plan:   Informed Consent: I have reviewed the patients History and Physical, chart, labs and discussed the procedure including the risks, benefits and alternatives for the proposed anesthesia with the patient or authorized representative who has indicated his/her understanding and acceptance.     Plan Discussed with: CRNA  Anesthesia Plan Comments:         Anesthesia Quick Evaluation

## 2018-06-22 NOTE — Anesthesia Post-op Follow-up Note (Signed)
Anesthesia QCDR form completed.        

## 2018-06-22 NOTE — Anesthesia Postprocedure Evaluation (Signed)
Anesthesia Post Note  Patient: Lilu D Weingarten  Procedure(s) Performed: COLONOSCOPY WITH PROPOFOL (N/A ) ESOPHAGOGASTRODUODENOSCOPY (EGD) WITH PROPOFOL (N/A )  Patient location during evaluation: Endoscopy Anesthesia Type: General Level of consciousness: awake and alert Pain management: pain level controlled Vital Signs Assessment: post-procedure vital signs reviewed and stable Respiratory status: spontaneous breathing, nonlabored ventilation, respiratory function stable and patient connected to nasal cannula oxygen Cardiovascular status: blood pressure returned to baseline and stable Postop Assessment: no apparent nausea or vomiting Anesthetic complications: no     Last Vitals:  Vitals:   06/22/18 1150 06/22/18 1200  BP: 111/74   Pulse:    Resp:  20  Temp:    SpO2:      Last Pain:  Vitals:   06/22/18 1200  TempSrc:   PainSc: 0-No pain                 Martha Clan

## 2018-06-22 NOTE — Op Note (Addendum)
[facility]enterology Patient Name: Yolanda Pace Procedure Date: 06/22/2018 10:16 AM MRN: [id number] Account #: [id number] Date of Birth: [birth year] Admit Type: Outpatient Age: 52 Room: [facility] ENDO ROOM 2 Gender: Female Note Status: Finalized Procedure:            Upper GI endoscopy Indications:          Heartburn Providers:            Johnetta Sloniker B. Bonna Gains MD, MD Referring MD:         Arnetha Courser (Referring MD) Medicines:            Monitored Anesthesia Care Complications:        No immediate complications. Procedure:            Pre-Anesthesia Assessment:                       - Prior to the procedure, a History and Physical was                        performed, and patient medications, allergies and                        sensitivities were reviewed. The patient's tolerance of                        previous anesthesia was reviewed.                       - The risks and benefits of the procedure and the                        sedation options and risks were discussed with the                        patient. All questions were answered and informed                        consent was obtained.                       - Patient identification and proposed procedure were                        verified prior to the procedure by the physician, the                        nurse, the anesthesiologist, the anesthetist and the                        technician. The procedure was verified in the procedure                        room.                       - ASA Grade Assessment: II - A patient with mild                        systemic disease.                       After obtaining  informed consent, the endoscope was                        passed under direct vision. Throughout the procedure,                        the patient's blood pressure, pulse, and oxygen                        saturations were monitored continuously. The Endoscope                        was  introduced through the mouth, and advanced to the                        second part of duodenum. The upper GI endoscopy was                        accomplished with ease. The patient tolerated the                        procedure well. Findings:      The examined esophagus was normal.      Patchy mildly erythematous mucosa without bleeding was found in the       gastric antrum. Biopsies were taken with a cold forceps for histology.       Biopsies were obtained in the gastric body, at the incisura and in the       gastric antrum with cold forceps for histology.      Multiple 4 to 6 mm sessile polyps with no bleeding and no stigmata of       recent bleeding were found in the gastric body. Biopsies were taken with       a cold forceps for histology.      Patchy mild mucosal changes characterized by polypoid appearance were       found in the duodenal bulb. Biopsies were taken with a cold forceps for       histology.      The second portion of the duodenum and examined duodenum were normal. Impression:           - Normal esophagus.                       - Erythematous mucosa in the antrum. Biopsied.                       - Multiple gastric polyps. Biopsied.                       - Mucosal changes in the duodenum. Biopsied.                       - Normal second portion of the duodenum and examined                        duodenum.                       - Biopsies were obtained in the gastric body, at the  incisura and in the gastric antrum. Recommendation:       - Await pathology results.                       - Discharge patient to home (with escort).                       - Advance diet as tolerated.                       - Continue present medications.                       - Patient has a contact number available for                        emergencies. The signs and symptoms of potential                        delayed complications were discussed with the patient.                         Return to normal activities tomorrow. Written discharge                        instructions were provided to the patient.                       - Discharge patient to home (with escort).                       - The findings and recommendations were discussed with                        the patient.                       - The findings and recommendations were discussed with                        the patient's family. Procedure Code(s):    --- Professional ---                       [phone], Esophagogastroduodenoscopy, flexible, transoral;                        with biopsy, single or multiple Diagnosis Code(s):    --- Professional ---                       K31.89, Other diseases of stomach and duodenum                       K31.7, Polyp of stomach and duodenum                       R12, Heartburn CPT copyright 2017 American Medical Association. All rights reserved. The codes documented in this report are preliminary and upon coder review may  be revised to meet current compliance requirements.  Vonda Antigua, MD Margretta Sidle B. Bonna Gains MD, MD 06/22/2018 10:55:12 AM This report has been signed electronically. Number of Addenda: 0 Note Initiated On: 06/22/2018 10:16 AM Estimated Blood  Loss: Estimated blood loss: none.      [facility]

## 2018-06-22 NOTE — H&P (Signed)
Vonda Antigua, MD [address] [place], [place], [place], [address] [address], [place], [place], [place], [address] Phone: [phone]  Fax: [phone]  Primary Care Physician:  Arnetha Courser, MD   Pre-Procedure History & Physical: HPI:  Yolanda Pace is a 52 y.o. female is here for a colonoscopy and EGD.   Past Medical History:  Diagnosis Date  . Allergic rhinitis   . Anxiety   . Arthritis   . Asthma    WELL CONTROLLED  . Complication of anesthesia    PT STATES THAT SHE WAS GIVEN SOME TYPE OF ANESTHESIA THAT MADE HER HAVE A SEIZURE IN 2015-NOTE IS IN CHART FROM [place] ANESTHESIA FROM 2015  . Cystic thyroid nodule March 2015   41mm on u/s  . Depression   . Fibromyalgia   . GERD (gastroesophageal reflux disease)   . History of kidney stones    SMALL STONE CURRENTLY  . Hypertension   . Insomnia   . Joint pain 04/2018   PT SEEING RHEUMATOLOGIST FOR JOINT PAIN THAT OCCURS APP ONCE A MONTH AND IS SO BAD THAT SHE CANT GET OUT OF BED  . Obesity   . PONV (postoperative nausea and vomiting)   . Right thyroid nodule 05/26/2017   Solid 1 cm RIGHT lobe July 2018  . Seizures ([place]) June 2015   provoked during cervical nerve block  . Skin cancer Aug 2015   removed from back  . Vitamin D deficiency disease     Past Surgical History:  Procedure Laterality Date  . ABDOMINAL HYSTERECTOMY  2005ish   fibroids, ovaries remain; along with bladder tack  . bladder tack  2005ish  . CHOLECYSTECTOMY    . CYSTO WITH HYDRODISTENSION N/A 05/02/2018   Procedure: CYSTOSCOPY/HYDRODISTENSION;  Surgeon: Royston Cowper, MD;  Location: [facility] ORS;  Service: Urology;  Laterality: N/A;  . INCONTINENCE SURGERY    . SKIN CANCER EXCISION  Aug 2015   removed from back    Prior to Admission medications   Medication Sig Start Date End Date Taking? Authorizing Provider  albuterol (PROAIR HFA) 108 (90 Base) MCG/ACT inhaler Inhale 2 puffs into the lungs every 4 (four) hours as needed for  wheezing or shortness of breath. 10/18/17  Yes Lada, Satira Anis, MD  albuterol (PROVENTIL) (2.5 MG/3ML) 0.083% nebulizer solution Take 3 mLs (2.5 mg total) by nebulization every 4 (four) hours as needed for wheezing or shortness of breath. 10/18/17  Yes Lada, Satira Anis, MD  amLODipine (NORVASC) 5 MG tablet Take 1 tablet (5 mg total) by mouth daily. Patient taking differently: Take 5 mg by mouth at bedtime.  01/31/18  Yes Lada, Satira Anis, MD  Cholecalciferol (VITAMIN D) 2000 units tablet Take 2,000 Units by mouth daily.    Yes [provider]  magic mouthwash SOLN Take 5 mLs by mouth 4 (four) times daily as needed for mouth pain. 06/12/18  Yes Lada, Satira Anis, MD  mometasone (NASONEX) 50 MCG/ACT nasal spray Place 2 sprays into the nose daily. 10/18/17  Yes Lada, Satira Anis, MD  montelukast (SINGULAIR) 10 MG tablet Take 1 tablet (10 mg total) by mouth at bedtime. 11/09/17  Yes Lada, Satira Anis, MD  omeprazole (PRILOSEC) 20 MG capsule Take 20 mg by mouth every morning.    Yes [provider]  sertraline (ZOLOFT) 50 MG tablet Take 1 tablet (50 mg total) by mouth at bedtime. 06/20/18  Yes Lada, Satira Anis, MD  traMADol (ULTRAM) 50 MG tablet Take 1 tablet  by mouth every 6 (six) hours as needed. To take up to 5 days   Yes [provider]  cyclobenzaprine (FLEXERIL) 10 MG tablet Take 1 tablet (10 mg total) by mouth 3 (three) times daily as needed for muscle spasms. Patient not taking: Reported on 06/20/2018 01/30/18   Merlyn Lot, MD  DULoxetine (CYMBALTA) 30 MG capsule Take 1 capsule (30 mg total) by mouth daily. For ten days, then two pills daily Patient not taking: Reported on 06/22/2018 06/20/18   Arnetha Courser, MD  fexofenadine [facility] ALLERGY) 180 MG tablet Take 1 tablet (180 mg total) by mouth daily. Patient not taking: Reported on 06/22/2018 01/31/18   Arnetha Courser, MD  Ketorolac Tromethamine (SPRIX NA) Place 1 spray into the nose as needed.     [provider]     Allergies as of 06/06/2018 - Review Complete 05/12/2018  Allergen Reaction Noted  . Other  02/22/2018  . Codeine Nausea And Vomiting 09/29/2015  . Compazine  [prochlorperazine] Other (See Comments) 09/29/2015  . Contrast media [iodinated diagnostic agents] Swelling 04/15/2015  . Doxycycline Nausea Only 09/29/2015  . Iodine Swelling 09/29/2015  . Sulfur Other (See Comments) 09/29/2015    Family History  Problem Relation Age of Onset  . Hyperlipidemia Mother   . Diabetes Sister   . Hyperlipidemia Sister   . Hypertension Sister   . Hypertension Brother   . Pneumonia Maternal Grandmother   . Heart attack Maternal Grandfather   . Diabetes Sister   . Hypertension Sister   . Epilepsy Sister   . Hypertension Sister   . Hypertension Daughter   . Kidney Stones Daughter   . Cancer Neg Hx   . COPD Neg Hx   . Heart disease Neg Hx   . Stroke Neg Hx     Social History   Socioeconomic History  . Marital status: Married    Spouse name: Not on file  . Number of children: Not on file  . Years of education: Not on file  . Highest education level: Not on file  Occupational History  . Not on file  Social Needs  . Financial resource strain: Not on file  . Food insecurity:    Worry: Not on file    Inability: Not on file  . Transportation needs:    Medical: Not on file    Non-medical: Not on file  Tobacco Use  . Smoking status: Never Smoker  . Smokeless tobacco: Never Used  Substance and Sexual Activity  . Alcohol use: No  . Drug use: No  . Sexual activity: Yes  Lifestyle  . Physical activity:    Days per week: Not on file    Minutes per session: Not on file  . Stress: Not on file  Relationships  . Social connections:    Talks on phone: Not on file    Gets together: Not on file    Attends religious service: Not on file    Active member of club or organization: Not on file    Attends meetings of clubs or organizations: Not on file    Relationship status: Not on file   . Intimate partner violence:    Fear of current or ex partner: Not on file    Emotionally abused: Not on file    Physically abused: Not on file    Forced sexual activity: Not on file  Other Topics Concern  . Not on file  Social History Narrative  . Not on file  Review of Systems: See HPI, otherwise negative ROS  Physical Exam: BP (!) 142/68   Pulse 70   Temp (!) 96.7 F (35.9 C) (Tympanic)   Resp 14   Ht 5\' 3"  (1.6 m)   Wt 83.9 kg   SpO2 97%   BMI 32.77 kg/m  General:   Alert,  pleasant and cooperative in NAD Head:  Normocephalic and atraumatic. Neck:  Supple; no masses or thyromegaly. Lungs:  Clear throughout to auscultation, normal respiratory effort.    Heart:  +S1, +S2, Regular rate and rhythm, No edema. Abdomen:  Soft, nontender and nondistended. Normal bowel sounds, without guarding, and without rebound.   Neurologic:  Alert and  oriented x4;  grossly normal neurologically.  Impression/Plan: Yolanda Pace is here for a colonoscopy to be performed for average risk screening and BRBPR and EGD for Acid Reflux.  Risks, benefits, limitations, and alternatives regarding the procedures have been reviewed with the patient.  Questions have been answered.  All parties agreeable.   Virgel Manifold, MD  06/22/2018, 10:14 AM

## 2018-06-22 NOTE — Op Note (Signed)
[facility] Gastroenterology Patient Name: Yolanda Pace Procedure Date: 06/22/2018 10:12 AM MRN: [id number] Account #: [id number] Date of Birth: 1966/10/15 Admit Type: Outpatient Age: 52 Room: [facility] ENDO ROOM 2 Gender: Female Note Status: Finalized Procedure:            Colonoscopy Indications:          Screening for colorectal malignant neoplasm, Incidental                        - Hematochezia Providers:            Keyonta Madrid B. Bonna Gains MD, MD Referring MD:         Arnetha Courser (Referring MD) Medicines:            Monitored Anesthesia Care Complications:        No immediate complications. Procedure:            Pre-Anesthesia Assessment:                       - ASA Grade Assessment: II - A patient with mild                        systemic disease.                       - Prior to the procedure, a History and Physical was                        performed, and patient medications, allergies and                        sensitivities were reviewed. The patient's tolerance of                        previous anesthesia was reviewed.                       - The risks and benefits of the procedure and the                        sedation options and risks were discussed with the                        patient. All questions were answered and informed                        consent was obtained.                       - Patient identification and proposed procedure were                        verified prior to the procedure by the physician, the                        nurse, the anesthesiologist, the anesthetist and the                        technician. The procedure was verified in the procedure  room.                       After obtaining informed consent, the colonoscope was                        passed under direct vision. Throughout the procedure,                        the patient's blood pressure, pulse, and oxygen                         saturations were monitored continuously. The                        Colonoscope was introduced through the anus and                        advanced to the the cecum, identified by appendiceal                        orifice and ileocecal valve. The colonoscopy was                        performed with ease. The patient tolerated the                        procedure well. The quality of the bowel preparation                        was good. Findings:      The perianal and digital rectal examinations were normal.      A 5 mm polyp was found in the transverse colon. The polyp was sessile.       The polyp was removed with a cold snare. Resection and retrieval were       complete.      Three sessile polyps were found in the transverse colon. The polyps were       2 to 3 mm in size. These polyps were removed with a cold biopsy forceps.       Resection and retrieval were complete.      A 7 mm polyp was found in the sigmoid colon. The polyp was       semi-pedunculated. The polyp was removed with a hot snare. Resection and       retrieval were complete.      The exam was otherwise without abnormality.      The rectum, sigmoid colon, descending colon, transverse colon, ascending       colon and cecum appeared normal.      Non-bleeding internal hemorrhoids were found during retroflexion. Impression:           - One 5 mm polyp in the transverse colon, removed with                        a cold snare. Resected and retrieved.                       - Three 2 to 3 mm polyps in the transverse colon,  removed with a cold biopsy forceps. Resected and                        retrieved.                       - One 7 mm polyp in the sigmoid colon, removed with a                        hot snare. Resected and retrieved.                       - The examination was otherwise normal.                       - The rectum, sigmoid colon, descending colon,                        transverse colon,  ascending colon and cecum are normal.                       - Non-bleeding internal hemorrhoids. Recommendation:       - Discharge patient to home (with escort).                       - Advance diet as tolerated.                       - Continue present medications.                       - Await pathology results.                       - Repeat colonoscopy date to be determined after                        pending pathology results are reviewed for surveillance.                       - The findings and recommendations were discussed with                        the patient.                       - The findings and recommendations were discussed with                        the patient's family.                       - Return to primary care physician as previously                        scheduled.                       - High fiber diet. Procedure Code(s):    --- Professional ---                       [phone], Colonoscopy, flexible; with removal of tumor(s),  polyp(s), or other lesion(s) by snare technique                       45380, 59, Colonoscopy, flexible; with biopsy, single                        or multiple Diagnosis Code(s):    --- Professional ---                       Z12.11, Encounter for screening for malignant neoplasm                        of colon                       D12.3, Benign neoplasm of transverse colon (hepatic                        flexure or splenic flexure)                       D12.5, Benign neoplasm of sigmoid colon                       K64.8, Other hemorrhoids CPT copyright 2017 American Medical Association. All rights reserved. The codes documented in this report are preliminary and upon coder review may  be revised to meet current compliance requirements.  Vonda Antigua, MD Margretta Sidle B. Bonna Gains MD, MD 06/22/2018 11:39:13 AM This report has been signed electronically. Number of Addenda: 0 Note Initiated On: 06/22/2018 10:12  AM Scope Withdrawal Time: 0 hours 27 minutes 31 seconds  Total Procedure Duration: 0 hours 38 minutes 5 seconds  Estimated Blood Loss: Estimated blood loss: none.      [facility]

## 2018-06-22 NOTE — Transfer of Care (Signed)
Immediate Anesthesia Transfer of Care Note  Patient: Yolanda Pace  Procedure(s) Performed: Procedure(s): COLONOSCOPY WITH PROPOFOL (N/A) ESOPHAGOGASTRODUODENOSCOPY (EGD) WITH PROPOFOL (N/A)  Patient Location: PACU and Endoscopy Unit  Anesthesia Type:General  Level of Consciousness: sedated  Airway & Oxygen Therapy: Patient Spontanous Breathing and Patient connected to nasal cannula oxygen  Post-op Assessment: Report given to RN and Post -op Vital signs reviewed and stable  Post vital signs: Reviewed and stable  Last Vitals:  Vitals:   06/22/18 0945 06/22/18 1140  BP: (!) 142/68 113/62  Pulse: 70 80  Resp: 14 17  Temp: (!) 35.9 C (!) 36.1 C  SpO2: 92% 924%    Complications: No apparent anesthesia complications

## 2018-06-24 LAB — SURGICAL PATHOLOGY

## 2018-06-27 ENCOUNTER — Encounter: Payer: Self-pay | Admitting: Gastroenterology

## 2018-07-19 ENCOUNTER — Telehealth: Payer: Self-pay | Admitting: Family Medicine

## 2018-07-19 NOTE — Telephone Encounter (Signed)
Copied from [place]. Topic: Quick Communication - Rx Refill/Question >> Jul 19, 2018  3:49 PM Yolanda Pace, [name] wrote: Medication: DULoxetine (CYMBALTA) 30 MG capsule  Has the patient contacted their pharmacy? Yes  Preferred Pharmacy (with phone number or street name): [facility] [address] [place] [phone] (Phone) [phone] (Fax)    Agent: Please be advised that RX refills may take up to 3 business days. We ask that you follow-up with your pharmacy.

## 2018-07-20 MED ORDER — DULOXETINE HCL 30 MG PO CPEP: 30.0000 mg | ORAL_CAPSULE | Freq: Every day | ORAL | 0 refills | Status: DC

## 2018-07-20 NOTE — Telephone Encounter (Signed)
Last 7/29 Next 9/30

## 2018-07-27 ENCOUNTER — Ambulatory Visit: Payer: Self-pay | Admitting: Rheumatology

## 2018-08-14 ENCOUNTER — Encounter

## 2018-08-14 ENCOUNTER — Ambulatory Visit: Payer: BC Managed Care – PPO | Admitting: Family Medicine

## 2018-08-14 ENCOUNTER — Encounter: Payer: Self-pay | Admitting: Family Medicine

## 2018-08-14 VITALS — BP 122/78 | HR 93 | Temp 98.2°F | Ht 63.0 in | Wt 188.4 lb

## 2018-08-14 DIAGNOSIS — Z01818 Encounter for other preprocedural examination: Secondary | ICD-10-CM | POA: Diagnosis not present

## 2018-08-14 MED ORDER — DULOXETINE HCL 60 MG PO CPEP: 60.0000 mg | ORAL_CAPSULE | Freq: Every day | ORAL | 3 refills | Status: DC

## 2018-08-14 NOTE — Patient Instructions (Signed)
I plan to clear you for surgery and will fax that clearance Let's get urine today

## 2018-08-14 NOTE — Progress Notes (Signed)
BP 122/78   Pulse 93   Temp 98.2 F (36.8 C)   Ht 5\' 3"  (1.6 m)   Wt 188 lb 6.4 oz (85.5 kg)   BMI 33.37 kg/m    Subjective:    Patient ID: Yolanda Pace, female    DOB: October 31, 1966, 52 y.o.   MRN: [id number]  HPI: Yolanda Pace is a 52 y.o. female  Chief Complaint  Patient presents with  . Follow-up    Surgery clearance    HPI Patient is here for surgical clearance Has gone to see Dr. Warner Mccreedy Veronikis, gyn and recontructive surgery; [place]; needs letter of clearance Going to have mesh removed  Prior surgeries discussed; would come out of anesthesia vomiting; they give her something now and that helps in the IV and she has been fine; under anesthesia twice this year, she has been IV medicine and has done just fine No malignant hyperthermia personally or in family  No hx of MRSA  Last few weeks: no chest pain; no unusual cough, asthma has been under excellent control; no fever; no diarrhea or vomiting Little bit of burning with urination, spasms cymbalta makes mouth dry and causes some reactive cough with eating   Depression screen [facility] 2/9 08/14/2018 08/14/2018 06/12/2018 05/12/2018 02/14/2018  Decreased Interest - 0 1 0 0  Down, Depressed, Hopeless 0 0 1 0 0  PHQ - 2 Score 0 0 2 0 0  Altered sleeping 2 - 3 2 -  Tired, decreased energy 3 - 1 1 -  Change in appetite 3 - 0 3 -  Feeling bad or failure about yourself  0 - 0 0 -  Trouble concentrating 0 - 1 1 -  Moving slowly or fidgety/restless 0 - 0 0 -  Suicidal thoughts 0 - 0 0 -  PHQ-9 Score 8 - 7 7 -  Difficult doing work/chores Very difficult - Not difficult at all Somewhat difficult -  Some recent data might be hidden   Fall Risk  08/14/2018 06/12/2018 05/12/2018 02/14/2018 01/31/2018  Falls in the past year? Yes No Yes No No  Number falls in past yr: 1 - 1 - -  Injury with Fall? Yes - Yes - -  she fell in May at work; slipped in some water that the custodian had mopped up; walked away to get the sign and she just  went right down on the left side, no head injury  Relevant past medical, surgical, family and social history reviewed Past Medical History:  Diagnosis Date  . Allergic rhinitis   . Anxiety   . Arthritis   . Asthma    WELL CONTROLLED  . Complication of anesthesia    PT STATES THAT SHE WAS GIVEN SOME TYPE OF ANESTHESIA THAT MADE HER HAVE A SEIZURE IN 2015-NOTE IS IN CHART FROM [place] ANESTHESIA FROM 2015  . Cystic thyroid nodule March 2015   40mm on u/s  . Depression   . Fibromyalgia   . GERD (gastroesophageal reflux disease)   . History of kidney stones    SMALL STONE CURRENTLY  . Hypertension   . Insomnia   . Joint pain 04/2018   PT SEEING RHEUMATOLOGIST FOR JOINT PAIN THAT OCCURS APP ONCE A MONTH AND IS SO BAD THAT SHE CANT GET OUT OF BED  . Obesity   . PONV (postoperative nausea and vomiting)   . Right thyroid nodule 05/26/2017   Solid 1 cm RIGHT lobe July 2018  . Seizures [facility]) June  2015   provoked during cervical nerve block  . Skin cancer Aug 2015   removed from back  . Vitamin D deficiency disease    Past Surgical History:  Procedure Laterality Date  . ABDOMINAL HYSTERECTOMY  2005ish   fibroids, ovaries remain; along with bladder tack  . bladder tack  2005ish  . CHOLECYSTECTOMY    . COLONOSCOPY WITH PROPOFOL N/A 06/22/2018   Procedure: COLONOSCOPY WITH PROPOFOL;  Surgeon: Virgel Manifold, MD;  Location: [facility] ENDOSCOPY;  Service: Endoscopy;  Laterality: N/A;  . CYSTO WITH HYDRODISTENSION N/A 05/02/2018   Procedure: CYSTOSCOPY/HYDRODISTENSION;  Surgeon: Royston Cowper, MD;  Location: [facility] ORS;  Service: Urology;  Laterality: N/A;  . ESOPHAGOGASTRODUODENOSCOPY (EGD) WITH PROPOFOL N/A 06/22/2018   Procedure: ESOPHAGOGASTRODUODENOSCOPY (EGD) WITH PROPOFOL;  Surgeon: Virgel Manifold, MD;  Location: [facility] ENDOSCOPY;  Service: Endoscopy;  Laterality: N/A;  . INCONTINENCE SURGERY    . SKIN CANCER EXCISION  Aug 2015   removed from back   Family History  Problem  Relation Age of Onset  . Hyperlipidemia Mother   . Diabetes Sister   . Hyperlipidemia Sister   . Hypertension Sister   . Hypertension Brother   . Pneumonia Maternal Grandmother   . Heart attack Maternal Grandfather   . Diabetes Sister   . Hypertension Sister   . Epilepsy Sister   . Hypertension Sister   . Hypertension Daughter   . Kidney Stones Daughter   . Cancer Neg Hx   . COPD Neg Hx   . Stroke Neg Hx    Social History   Tobacco Use  . Smoking status: Never Smoker  . Smokeless tobacco: Never Used  Substance Use Topics  . Alcohol use: No  . Drug use: No     Office Visit from 08/14/2018 in [facility]  AUDIT-C Score  0      Interim medical history since last visit reviewed. Allergies and medications reviewed  Review of Systems Per HPI unless specifically indicated above     Objective:    BP 122/78   Pulse 93   Temp 98.2 F (36.8 C)   Ht 5\' 3"  (1.6 m)   Wt 188 lb 6.4 oz (85.5 kg)   BMI 33.37 kg/m   Wt Readings from Last 3 Encounters:  08/14/18 188 lb 6.4 oz (85.5 kg)  06/22/18 185 lb (83.9 kg)  06/20/18 187 lb (84.8 kg)    Physical Exam  Constitutional: She appears well-developed and well-nourished. No distress.  HENT:  Head: Normocephalic and atraumatic.  Right Ear: External ear and ear canal normal.  Left Ear: External ear and ear canal normal.  Mouth/Throat: No posterior oropharyngeal edema or posterior oropharyngeal erythema.  Eyes: EOM are normal. No scleral icterus.  Neck: No thyromegaly present.  Cardiovascular: Normal rate, regular rhythm and normal heart sounds.  No murmur heard. Pulmonary/Chest: Effort normal and breath sounds normal. No respiratory distress. She has no wheezes.  Abdominal: Soft. Bowel sounds are normal. She exhibits no distension. There is no tenderness. There is no guarding.  Musculoskeletal: She exhibits no edema.  Neurological: She is alert.  Skin: Skin is warm and dry. She is not diaphoretic. No  pallor.  Psychiatric: She has a normal mood and affect. Her behavior is normal. Judgment and thought content normal. Her mood appears not anxious. She does not exhibit a depressed mood.      Assessment & Plan:   Problem List Items Addressed This Visit    None  Visit Diagnoses    Pre-op evaluation    -  Primary   she will have labs done in [place]; urine collected today to r/o infection; otherwise, anticipate clearing her for surgery for October 22nd   Relevant Orders   Urinalysis w microscopic + reflex cultur       Follow up plan: No follow-ups on file.  An after-visit summary was printed and given to the patient at [place].  Please see the patient instructions which may contain other information and recommendations beyond what is mentioned above in the assessment and plan.  Meds ordered this encounter  Medications  . DULoxetine (CYMBALTA) 60 MG capsule    Sig: Take 1 capsule (60 mg total) by mouth daily.    Dispense:  90 capsule    Refill:  3    Orders Placed This Encounter  Procedures  . Urinalysis w microscopic + reflex cultur

## 2018-08-15 ENCOUNTER — Other Ambulatory Visit: Payer: Self-pay | Admitting: Family Medicine

## 2018-08-15 ENCOUNTER — Encounter: Payer: Self-pay | Admitting: Family Medicine

## 2018-08-15 HISTORY — PX: OTHER SURGICAL HISTORY: SHX169

## 2018-08-15 LAB — URINALYSIS W MICROSCOPIC + REFLEX CULTURE
Bacteria, UA: NONE SEEN /HPF
Bilirubin Urine: NEGATIVE
Glucose, UA: NEGATIVE
HYALINE CAST: NONE SEEN /LPF
Hgb urine dipstick: NEGATIVE
Ketones, ur: NEGATIVE
Leukocyte Esterase: NEGATIVE
Nitrites, Initial: NEGATIVE
PROTEIN: NEGATIVE
RBC / HPF: NONE SEEN /HPF (ref 0–2)
Specific Gravity, Urine: 1.024 (ref 1.001–1.03)
Squamous Epithelial / HPF: NONE SEEN /HPF (ref <=5)
WBC, UA: NONE SEEN /HPF (ref 0–5)
pH: 5.5 (ref 5.0–8.0)

## 2018-08-15 LAB — NO CULTURE INDICATED

## 2018-09-12 ENCOUNTER — Ambulatory Visit: Payer: BC Managed Care – PPO | Admitting: Family Medicine

## 2018-10-04 ENCOUNTER — Other Ambulatory Visit: Payer: Self-pay | Admitting: Otolaryngology

## 2018-10-04 DIAGNOSIS — E041 Nontoxic single thyroid nodule: Secondary | ICD-10-CM

## 2018-10-10 ENCOUNTER — Ambulatory Visit
Admission: RE | Admit: 2018-10-10 | Discharge: 2018-10-10 | Disposition: A | Discharge status: Home / Self Care | Payer: BC Managed Care – PPO | Source: Ambulatory Visit | Attending: Otolaryngology | Admitting: Otolaryngology

## 2018-10-10 DIAGNOSIS — E041 Nontoxic single thyroid nodule: Secondary | ICD-10-CM | POA: Diagnosis not present

## 2018-10-31 ENCOUNTER — Other Ambulatory Visit: Payer: Self-pay | Admitting: Family Medicine

## 2019-01-08 HISTORY — PX: ROTATOR CUFF REPAIR: SHX139

## 2019-01-19 ENCOUNTER — Ambulatory Visit: Payer: BC Managed Care – PPO | Admitting: Family Medicine

## 2019-01-19 ENCOUNTER — Encounter: Payer: Self-pay | Admitting: Family Medicine

## 2019-01-19 VITALS — BP 122/80 | HR 100 | Temp 98.0°F | Resp 12 | Ht 63.0 in | Wt 189.7 lb

## 2019-01-19 DIAGNOSIS — R300 Dysuria: Secondary | ICD-10-CM | POA: Diagnosis not present

## 2019-01-19 LAB — POCT URINALYSIS DIPSTICK
Bilirubin, UA: NEGATIVE
Glucose, UA: NEGATIVE
KETONES UA: NEGATIVE
NITRITE UA: POSITIVE
PH UA: 5 (ref 5.0–8.0)
PROTEIN UA: NEGATIVE
RBC UA: POSITIVE
Spec Grav, UA: 1.02 (ref 1.010–1.025)
UROBILINOGEN UA: 0.2 U/dL

## 2019-01-19 MED ORDER — NITROFURANTOIN MONOHYD MACRO 100 MG PO CAPS: 100.0000 mg | ORAL_CAPSULE | Freq: Two times a day (BID) | ORAL | 0 refills | Status: DC

## 2019-01-19 NOTE — Progress Notes (Signed)
BP 122/80   Pulse 100   Temp 98 F (36.7 C) (Oral)   Resp 12   Ht 5\' 3"  (1.6 m)   Wt 189 lb 11.2 oz (86 kg)   SpO2 95%   BMI 33.60 kg/m    Subjective:    Patient ID: Yolanda Pace, female    DOB: 01/29/1966, 53 y.o.   MRN: [id number]  HPI: Yolanda Pace is a 53 y.o. female  Chief Complaint  Patient presents with  . Urinary Tract Infection    onset 1 week, symptoms include: frequency, blood, urgency, burning and back pain    HPI She is here for an acute visit Symptoms started a week ago; urine frequeny, urgency, burning and back pain; leakage from bladder sling removed, not assoc w/bladder infection and will be repaire next eyar This will be the 2nd one since surgery; went to walk in clinic first time; culture came back and ABX were changed, something stronger; staph and E coli She had blood when she wiped and when she wiped Wednesday Took azo She had her mesh removed; doctor in [place]; joints are not aching nearly as much Still has some fibromyalgia; still has little crashes from the fibro Feels like a whole new person  Left rotator cuff surgery; no fevers, doing well  Depression screen [facility] 2/9 01/19/2019 08/14/2018 08/14/2018 06/12/2018 05/12/2018  Decreased Interest 0 - 0 1 0  Down, Depressed, Hopeless 0 0 0 1 0  PHQ - 2 Score 0 0 0 2 0  Altered sleeping 0 2 - 3 2  Tired, decreased energy 0 3 - 1 1  Change in appetite 0 3 - 0 3  Feeling bad or failure about yourself  0 0 - 0 0  Trouble concentrating 0 0 - 1 1  Moving slowly or fidgety/restless 0 0 - 0 0  Suicidal thoughts 0 0 - 0 0  PHQ-9 Score 0 8 - 7 7  Difficult doing work/chores Not difficult at all Very difficult - Not difficult at all Somewhat difficult  Some recent data might be hidden   Fall Risk  01/19/2019 08/14/2018 06/12/2018 05/12/2018 02/14/2018  Falls in the past year? 0 Yes No Yes No  Number falls in past yr: 0 1 - 1 -  Injury with Fall? 0 Yes - Yes -    Relevant past medical, surgical, family and  social history reviewed Past Medical History:  Diagnosis Date  . Allergic rhinitis   . Anxiety   . Arthritis   . Asthma    WELL CONTROLLED  . Complication of anesthesia    PT STATES THAT SHE WAS GIVEN SOME TYPE OF ANESTHESIA THAT MADE HER HAVE A SEIZURE IN 2015-NOTE IS IN CHART FROM [place] ANESTHESIA FROM 2015  . Cystic thyroid nodule March 2015   38mm on u/s  . Depression   . Fibromyalgia   . GERD (gastroesophageal reflux disease)   . History of kidney stones    SMALL STONE CURRENTLY  . Hypertension   . Insomnia   . Joint pain 04/2018   PT SEEING RHEUMATOLOGIST FOR JOINT PAIN THAT OCCURS APP ONCE A MONTH AND IS SO BAD THAT SHE CANT GET OUT OF BED  . Obesity   . PONV (postoperative nausea and vomiting)   . Right thyroid nodule 05/26/2017   Solid 1 cm RIGHT lobe July 2018  . Seizures ([place]) June 2015   provoked during cervical nerve block  . Skin cancer Aug 2015  removed from back  . Vitamin D deficiency disease    Past Surgical History:  Procedure Laterality Date  . ABDOMINAL HYSTERECTOMY  2005ish   fibroids, ovaries remain; along with bladder tack  . bladder sling mesh removal  08/2018  . bladder tack  2005ish  . CHOLECYSTECTOMY    . COLONOSCOPY WITH PROPOFOL N/A 06/22/2018   Procedure: COLONOSCOPY WITH PROPOFOL;  Surgeon: Virgel Manifold, MD;  Location: [facility] ENDOSCOPY;  Service: Endoscopy;  Laterality: N/A;  . CYSTO WITH HYDRODISTENSION N/A 05/02/2018   Procedure: CYSTOSCOPY/HYDRODISTENSION;  Surgeon: Royston Cowper, MD;  Location: [facility] ORS;  Service: Urology;  Laterality: N/A;  . ESOPHAGOGASTRODUODENOSCOPY (EGD) WITH PROPOFOL N/A 06/22/2018   Procedure: ESOPHAGOGASTRODUODENOSCOPY (EGD) WITH PROPOFOL;  Surgeon: Virgel Manifold, MD;  Location: [facility] ENDOSCOPY;  Service: Endoscopy;  Laterality: N/A;  . INCONTINENCE SURGERY    . ROTATOR CUFF REPAIR Right 01/08/2019  . SKIN CANCER EXCISION  Aug 2015   removed from back   Family History  Problem Relation Age of  Onset  . Hyperlipidemia Mother   . Diabetes Sister   . Hyperlipidemia Sister   . Hypertension Sister   . Hypertension Brother   . Pneumonia Maternal Grandmother   . Heart attack Maternal Grandfather   . Diabetes Sister   . Hypertension Sister   . Epilepsy Sister   . Hypertension Sister   . Hypertension Daughter   . Kidney Stones Daughter   . Cancer Neg Hx   . COPD Neg Hx   . Stroke Neg Hx    Social History   Tobacco Use  . Smoking status: Never Smoker  . Smokeless tobacco: Never Used  Substance Use Topics  . Alcohol use: No  . Drug use: No     Office Visit from 01/19/2019 in [facility]  AUDIT-C Score  0      Interim medical history since last visit reviewed. Allergies and medications reviewed  Review of Systems Per HPI unless specifically indicated above     Objective:    BP 122/80   Pulse 100   Temp 98 F (36.7 C) (Oral)   Resp 12   Ht 5\' 3"  (1.6 m)   Wt 189 lb 11.2 oz (86 kg)   SpO2 95%   BMI 33.60 kg/m     Physical Exam Constitutional:      Appearance: She is well-developed.  Eyes:     General: No scleral icterus. Cardiovascular:     Rate and Rhythm: Normal rate and regular rhythm.  Pulmonary:     Effort: Pulmonary effort is normal.     Breath sounds: Normal breath sounds.  Psychiatric:        Behavior: Behavior normal.        Assessment & Plan:   Problem List Items Addressed This Visit    None    Visit Diagnoses    Burning with urination    -  Primary   suspicious for UTI; start antibiotics; culture pending; hydration, seek care if worsening   Relevant Orders   POCT urinalysis dipstick (Completed)   Urine Culture (Completed)       Follow up plan: No follow-ups on file.  An after-visit summary was printed and given to the patient at [place].  Please see the patient instructions which may contain other information and recommendations beyond what is mentioned above in the assessment and plan.  Meds ordered  this encounter  Medications  . nitrofurantoin, macrocrystal-monohydrate, (MACROBID) 100 MG capsule  Sig: Take 1 capsule (100 mg total) by mouth 2 (two) times daily.    Dispense:  14 capsule    Refill:  0    Orders Placed This Encounter  Procedures  . Urine Culture  . POCT urinalysis dipstick

## 2019-01-19 NOTE — Patient Instructions (Addendum)
Stay hydrated Go to the ER if worsening Start the antibiotics  Urinary Tract Infection, Adult A urinary tract infection (UTI) is an infection of any part of the urinary tract. The urinary tract includes:  The kidneys.  The ureters.  The bladder.  The urethra. These organs make, store, and get rid of pee (urine) in the body. What are the causes? This is caused by germs (bacteria) in your genital area. These germs grow and cause swelling (inflammation) of your urinary tract. What increases the risk? You are more likely to develop this condition if:  You have a small, thin tube (catheter) to drain pee.  You cannot control when you pee or poop (incontinence).  You are female, and: ? You use these methods to prevent pregnancy: ? A medicine that kills sperm (spermicide). ? A device that blocks sperm (diaphragm). ? You have low levels of a female hormone (estrogen). ? You are pregnant.  You have genes that add to your risk.  You are sexually active.  You take antibiotic medicines.  You have trouble peeing because of: ? A prostate that is bigger than normal, if you are female. ? A blockage in the part of your body that drains pee from the bladder (urethra). ? A kidney stone. ? A nerve condition that affects your bladder (neurogenic bladder). ? Not getting enough to drink. ? Not peeing often enough.  You have other conditions, such as: ? Diabetes. ? A weak disease-fighting system (immune system). ? Sickle cell disease. ? Gout. ? Injury of the spine. What are the signs or symptoms? Symptoms of this condition include:  Needing to pee right away (urgently).  Peeing often.  Peeing small amounts often.  Pain or burning when peeing.  Blood in the pee.  Pee that smells bad or not like normal.  Trouble peeing.  Pee that is cloudy.  Fluid coming from the vagina, if you are female.  Pain in the belly or lower back. Other symptoms include:  Throwing up  (vomiting).  No urge to eat.  Feeling mixed up (confused).  Being tired and grouchy (irritable).  A fever.  Watery poop (diarrhea). How is this treated? This condition may be treated with:  Antibiotic medicine.  Other medicines.  Drinking enough water. Follow these instructions at home:  Medicines  Take over-the-counter and prescription medicines only as told by your doctor.  If you were prescribed an antibiotic medicine, take it as told by your doctor. Do not stop taking it even if you start to feel better. General instructions  Make sure you: ? Pee until your bladder is empty. ? Do not hold pee for a long time. ? Empty your bladder after sex. ? Wipe from front to back after pooping if you are a female. Use each tissue one time when you wipe.  Drink enough fluid to keep your pee pale yellow.  Keep all follow-up visits as told by your doctor. This is important. Contact a doctor if:  You do not get better after 1-2 days.  Your symptoms go away and then come back. Get help right away if:  You have very bad back pain.  You have very bad pain in your lower belly.  You have a fever.  You are sick to your stomach (nauseous).  You are throwing up. Summary  A urinary tract infection (UTI) is an infection of any part of the urinary tract.  This condition is caused by germs in your genital area.  There are  many risk factors for a UTI. These include having a small, thin tube to drain pee and not being able to control when you pee or poop.  Treatment includes antibiotic medicines for germs.  Drink enough fluid to keep your pee pale yellow. This information is not intended to replace advice given to you by your health care provider. Make sure you discuss any questions you have with your health care provider. Document Released: 04/19/2008 Document Revised: 05/11/2018 Document Reviewed: 05/11/2018 Elsevier Interactive Patient Education  2019 [employer].

## 2019-01-21 LAB — URINE CULTURE
MICRO NUMBER:: 288539
SPECIMEN QUALITY:: ADEQUATE

## 2019-01-31 ENCOUNTER — Other Ambulatory Visit: Payer: Self-pay | Admitting: Family Medicine

## 2019-01-31 NOTE — Telephone Encounter (Signed)
ccb approved xyzal denied, that was switched to [employer]

## 2019-03-13 ENCOUNTER — Ambulatory Visit (INDEPENDENT_AMBULATORY_CARE_PROVIDER_SITE_OTHER): Payer: BC Managed Care – PPO | Admitting: Nurse Practitioner

## 2019-03-13 ENCOUNTER — Telehealth: Payer: Self-pay | Admitting: Family Medicine

## 2019-03-13 ENCOUNTER — Encounter: Payer: Self-pay | Admitting: Nurse Practitioner

## 2019-03-13 ENCOUNTER — Other Ambulatory Visit: Payer: Self-pay

## 2019-03-13 VITALS — BP 154/80 | HR 94 | Resp 16

## 2019-03-13 DIAGNOSIS — R52 Pain, unspecified: Secondary | ICD-10-CM | POA: Diagnosis not present

## 2019-03-13 DIAGNOSIS — R509 Fever, unspecified: Secondary | ICD-10-CM | POA: Diagnosis not present

## 2019-03-13 DIAGNOSIS — R197 Diarrhea, unspecified: Secondary | ICD-10-CM

## 2019-03-13 DIAGNOSIS — R110 Nausea: Secondary | ICD-10-CM

## 2019-03-13 DIAGNOSIS — R109 Unspecified abdominal pain: Secondary | ICD-10-CM

## 2019-03-13 MED ORDER — PROMETHAZINE HCL 12.5 MG PO TABS: 12.5000 mg | ORAL_TABLET | Freq: Three times a day (TID) | ORAL | 0 refills | Status: DC | PRN

## 2019-03-13 MED ORDER — DICYCLOMINE HCL 10 MG PO CAPS: 10.0000 mg | ORAL_CAPSULE | Freq: Three times a day (TID) | ORAL | 1 refills | Status: DC | PRN

## 2019-03-13 NOTE — Progress Notes (Signed)
Virtual Visit via Video Note  I connected with Yolanda Pace on 03/13/19 at 11:20 AM EDT by a video enabled telemedicine application and verified that I am speaking with the correct person using two identifiers.   Staff discussed the limitations of evaluation and management by telemedicine and the availability of in person appointments. The patient expressed understanding and agreed to proceed.  Patient location: home  My location: work office Other people present: none HPI  Patient endorses fever, chills, body aches and diarrhea that started on Friday. States she felt sick on Thursday after she ate a cheeseburger from [employer]. States Saturday she stayed in bed the whole day and didn't eat or drink. Sunday tried to drink some fluids but states as soon as she puts anything on her stomach she has to go to the bathroom. States endorses headache and abdominal cramping after having food on stomach. States yesterday had toast and mac and cheese. States today she developed a mouth ulcer. Patient took imodium 2 tablets yesterday and didn't really stop the diarrhea. States has zofran at home but gives her a bad headache when she takes it. States was up all last night with diarrhea at least 20 episodes- states just this morning noticed a little bit of bright red blood with wiping; has history of hemorrhoids. No fevers since first day.   PHQ2/9: Depression screen [facility] 2/9 03/13/2019 01/19/2019 08/14/2018 08/14/2018 06/12/2018  Decreased Interest 0 0 - 0 1  Down, Depressed, Hopeless 0 0 0 0 1  PHQ - 2 Score 0 0 0 0 2  Altered sleeping 0 0 2 - 3  Tired, decreased energy 0 0 3 - 1  Change in appetite 0 0 3 - 0  Feeling bad or failure about yourself  0 0 0 - 0  Trouble concentrating 0 0 0 - 1  Moving slowly or fidgety/restless 0 0 0 - 0  Suicidal thoughts 0 0 0 - 0  PHQ-9 Score 0 0 8 - 7  Difficult doing work/chores Not difficult at all Not difficult at all Very difficult - Not difficult at all  Some recent data  might be hidden     PHQ reviewed. Negative  Patient Active Problem List   Diagnosis Date Noted  . Stomach irritation   . Gastric polyp   . Encounter for screening colonoscopy   . Benign neoplasm of transverse colon   . Internal hemorrhoids   . Polyp of sigmoid colon   . Controlled substance agreement signed 05/12/2018  . 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 Kidney stone 02/20/2018  . Renal lesion 02/20/2018  . Benign hypertension 01/21/2018  . Pelvic pain 12/26/2017  . Depression, major, recurrent, moderate ([facility]) 12/26/2017  . GERD (gastroesophageal reflux disease) 11/09/2017  . 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 Elevated C-reactive protein (CRP) 07/11/2017  . Right thyroid nodule 05/26/2017  . Mild hypercholesterolemia 05/06/2017  . Medication monitoring encounter 05/06/2017  . Generalized osteoarthritis of hand 12/13/2016  . Impingement syndrome of shoulder region, left 11/29/2016  . Heartburn 11/16/2016  . Pain, joint, multiple sites 05/07/2016  . Preventative health care 10/18/2015  . Elevated blood pressure reading 10/18/2015  . Breast cancer screening 10/17/2015  . Asthma   . Anxiety   . Depression   . Allergic rhinitis   . Insomnia   . Obesity   . Vitamin D deficiency disease   . Cystic thyroid nodule 01/13/2014    Past Medical History:  Diagnosis Date  . Allergic rhinitis   .  Anxiety   . Arthritis   . Asthma    WELL CONTROLLED  . Complication of anesthesia    PT STATES THAT SHE WAS GIVEN SOME TYPE OF ANESTHESIA THAT MADE HER HAVE A SEIZURE IN 2015-NOTE IS IN CHART FROM [place] ANESTHESIA FROM 2015  . Cystic thyroid nodule March 2015   55m on u/s  . Depression   . Fibromyalgia   . GERD (gastroesophageal reflux disease)   . History of kidney stones    SMALL STONE CURRENTLY  . Hypertension   . Insomnia   . Joint pain 04/2018   PT SEEING RHEUMATOLOGIST FOR JOINT PAIN THAT OCCURS APP ONCE A MONTH AND IS SO BAD  THAT SHE CANT GET OUT OF BED  . Obesity   . PONV (postoperative nausea and vomiting)   . Right thyroid nodule 05/26/2017   Solid 1 cm RIGHT lobe July 2018  . Seizures ([place] June 2015   provoked during cervical nerve block  . Skin cancer Aug 2015   removed from back  . Vitamin D deficiency disease     Past Surgical History:  Procedure Laterality Date  . ABDOMINAL HYSTERECTOMY  2005ish   fibroids, ovaries remain; along with bladder tack  . bladder sling mesh removal  08/2018  . bladder tack  2005ish  . CHOLECYSTECTOMY    . COLONOSCOPY WITH PROPOFOL N/A 06/22/2018   Procedure: COLONOSCOPY WITH PROPOFOL;  Surgeon: TVirgel Manifold MD;  Location: [facility] ENDOSCOPY;  Service: Endoscopy;  Laterality: N/A;  . CYSTO WITH HYDRODISTENSION N/A 05/02/2018   Procedure: CYSTOSCOPY/HYDRODISTENSION;  Surgeon: WRoyston Cowper MD;  Location: [facility] ORS;  Service: Urology;  Laterality: N/A;  . ESOPHAGOGASTRODUODENOSCOPY (EGD) WITH PROPOFOL N/A 06/22/2018   Procedure: ESOPHAGOGASTRODUODENOSCOPY (EGD) WITH PROPOFOL;  Surgeon: TVirgel Manifold MD;  Location: [facility] ENDOSCOPY;  Service: Endoscopy;  Laterality: N/A;  . INCONTINENCE SURGERY    . ROTATOR CUFF REPAIR Right 01/08/2019  . SKIN CANCER EXCISION  Aug 2015   removed from back    Social History   Tobacco Use  . Smoking status: Never Smoker  . Smokeless tobacco: Never Used  Substance Use Topics  . Alcohol use: No     Current Outpatient Medications:  .  albuterol (PROAIR HFA) 108 (90 Base) MCG/ACT inhaler, Inhale 2 puffs into the lungs every 4 (four) hours as needed for wheezing or shortness of breath., Disp: 1 Inhaler, Rfl: 2 .  albuterol (PROVENTIL) (2.5 MG/3ML) 0.083% nebulizer solution, Take 3 mLs (2.5 mg total) by nebulization every 4 (four) hours as needed for wheezing or shortness of breath., Disp: 75 mL, Rfl: 1 .  amLODipine (NORVASC) 5 MG tablet, Take 1 tablet by mouth once daily, Disp: 90 tablet, Rfl: 1 .  Cholecalciferol (VITAMIN  D) 2000 units tablet, Take 2,000 Units by mouth daily. , Disp: , Rfl:  .  cyclobenzaprine (FEXMID) 7.5 MG tablet, Take 7.5 mg by mouth 2 (two) times daily as needed., Disp: , Rfl:  .  DULoxetine (CYMBALTA) 60 MG capsule, Take 1 capsule (60 mg total) by mouth daily., Disp: 90 capsule, Rfl: 3 .  fexofenadine (ALLEGRA ALLERGY) 180 MG tablet, Take 1 tablet (180 mg total) by mouth daily., Disp: 90 tablet, Rfl: 3 .  ibuprofen (ADVIL,MOTRIN) 600 MG tablet, Take 600 mg by mouth daily with breakfast., Disp: , Rfl:  .  montelukast (SINGULAIR) 10 MG tablet, TAKE 1 TABLET BY MOUTH AT BEDTIME, Disp: 90 tablet, Rfl: 2 .  omeprazole (PRILOSEC) 20 MG capsule, Take 20 mg by  mouth every morning. , Disp: , Rfl:  .  dicyclomine (BENTYL) 10 MG capsule, Take 1 capsule (10 mg total) by mouth every 8 (eight) hours as needed for spasms., Disp: 15 capsule, Rfl: 1 .  mometasone (NASONEX) 50 MCG/ACT nasal spray, Place 2 sprays into the nose daily. (Patient not taking: Reported on 03/13/2019), Disp: 17 g, Rfl: 12 .  promethazine (PHENERGAN) 12.5 MG tablet, Take 1 tablet (12.5 mg total) by mouth every 8 (eight) hours as needed for nausea or vomiting., Disp: 15 tablet, Rfl: 0  Allergies  Allergen Reactions  . Other     Mescalor: vomiting   . Codeine Nausea And Vomiting  . Compazine  [Prochlorperazine] Other (See Comments)    Jaws locked up  . Contrast Media [Iodinated Diagnostic Agents] Swelling    Noted in [facility]. chart on 01/06/2012. Premedicate patient prior to contrast media injections.  . Doxycycline Nausea Only    GI upset  . Iodine Swelling  . Sulfur Other (See Comments)    Made all blood vessels in eyes burst    ROS   No other specific complaints in a complete review of systems (except as listed in HPI above).  Objective  Vitals:   03/13/19 1216  BP: (!) 154/80  Pulse: 94  Resp: 16     There is no height or weight on file to calculate BMI.  Nursing Note and Vital Signs reviewed.  Physical  Exam   Constitutional: Patient appears tired and uncomfortable, not in acute distress.  HENT: Head: Normocephalic and atraumatic. Cardiovascular: Normal rate Pulmonary/Chest: Effort normal  Musculoskeletal: Normal range of motion, Abdominal: no guarding or tenderness, patient notes mid-left generalized abdominal pain Neurological: he is alert and oriented to person, place, and time. speech and gait are normal.  Skin: No rash noted. No erythema.  Psychiatric: Patient has a normal mood and affect. behavior is normal. Judgment and thought content normal.    Assessment & Plan  Patient appears uncomfortable but not in acute distress.  Phenergan as needed for nausea, discussed Imodium as directed for 2 days.  Electrolyte-water replacement to make sure urine is pale yellow and ensure proper hydration and electrolyte replacement.  Discussed signs and symptoms necessitating patient have earlier follow-up or ER if needed.  Slowly advance diet.  If symptoms are not improving or worsening will come in for stool culture and labs.  Bentyl for abdominal cramping if needed. 1. Fever, unspecified fever cause - Stool Culture; Future - CBC with Differential; Future - COMPLETE METABOLIC PANEL WITH GFR; Future  2. Body aches - Stool Culture; Future - CBC with Differential; Future - COMPLETE METABOLIC PANEL WITH GFR; Future  3. Diarrhea, unspecified type - Stool Culture; Future - CBC with Differential; Future - COMPLETE METABOLIC PANEL WITH GFR; Future  4. Nausea - promethazine (PHENERGAN) 12.5 MG tablet; Take 1 tablet (12.5 mg total) by mouth every 8 (eight) hours as needed for nausea or vomiting.  Dispense: 15 tablet; Refill: 0 - Stool Culture; Future - CBC with Differential; Future - COMPLETE METABOLIC PANEL WITH GFR; Future  5. Abdominal cramping - dicyclomine (BENTYL) 10 MG capsule; Take 1 capsule (10 mg total) by mouth every 8 (eight) hours as needed for spasms.  Dispense: 15 capsule; Refill:  1 - CBC with Differential; Future - COMPLETE METABOLIC PANEL WITH GFR; Future   Follow Up Instructions:  Follow up in 2 days    I discussed the assessment and treatment plan with the patient. The patient was provided an  opportunity to ask questions and all were answered. The patient agreed with the plan and demonstrated an understanding of the instructions.   The patient was advised to call back or seek an in-person evaluation if the symptoms worsen or if the condition fails to improve as anticipated.  I provided 18 minutes of non-face-to-face time during this encounter, over 5 minutes of additional time spent in chart.    Fredderick Severance, NP

## 2019-03-13 NOTE — Telephone Encounter (Signed)
Copied from [place]. Topic: Quick Communication - See Telephone Encounter >> Mar 13, 2019  4:40 PM Rutherford Nail, [place] wrote: CRM for notification. See Telephone encounter for: 03/13/19. Melissa with [facility] calling and states that the patient has an allergy to promethazine (PHENERGAN) 12.5 MG tablet which was sent in today. Would like to know alternative medication would like to be used. Please advise.  CB#: [phone]

## 2019-03-14 NOTE — Telephone Encounter (Signed)
Spoke with patient and she already has zofran from a past surgery and said she would take it. Took some last night and said it helped. I told her to call in if she needed anything else.

## 2019-03-14 NOTE — Telephone Encounter (Signed)
Patient states she has taken this medication without issue in the past, had an allergy to compazine that had similar components. Can switch to zofran 4mg  ODT q 8 hours #20 pills- will place order if patient would like. She had previously stated that this medicine gave her a headache and she has some at home. Just let me know

## 2019-06-15 IMAGING — DX DG SHOULDER 2+V*L*
3 series · 3 of 3 positions shown · non-contrast
Comparison: None.

CLINICAL DATA: Pain following fall

EXAM:
LEFT SHOULDER - 2+ VIEW

[shoulder ap (1 of 2)]
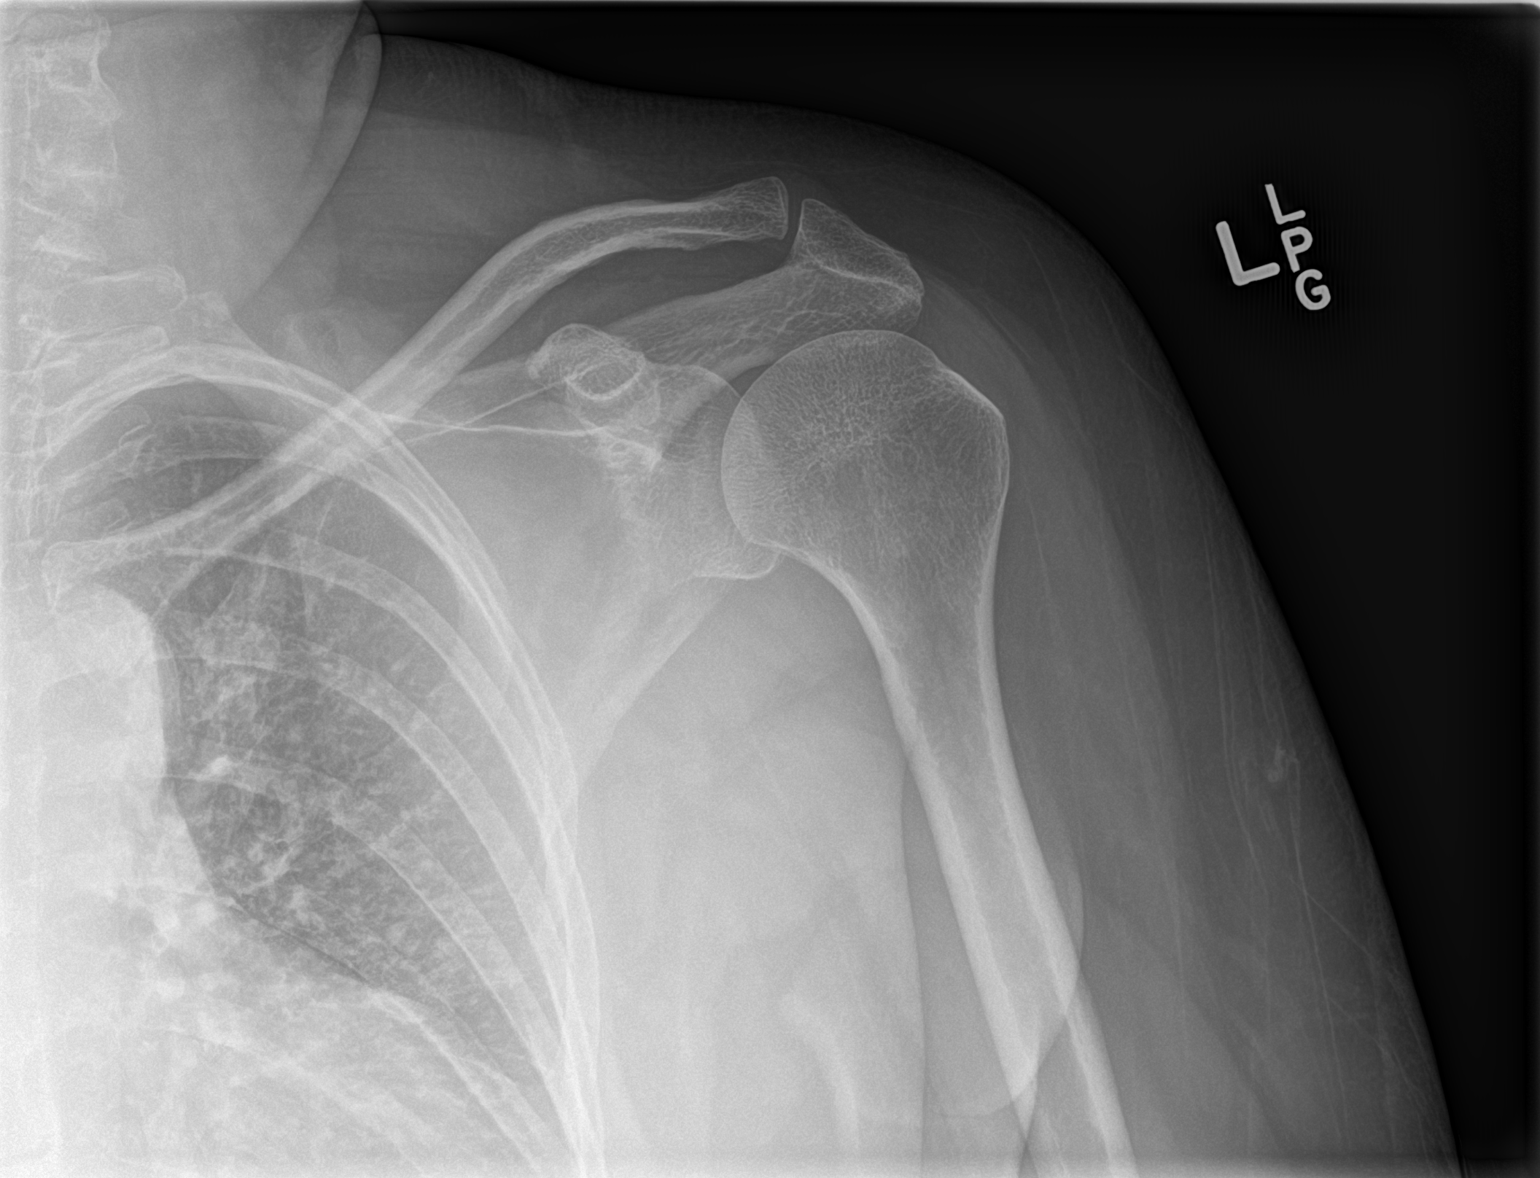

[shoulder ap (2 of 2)]
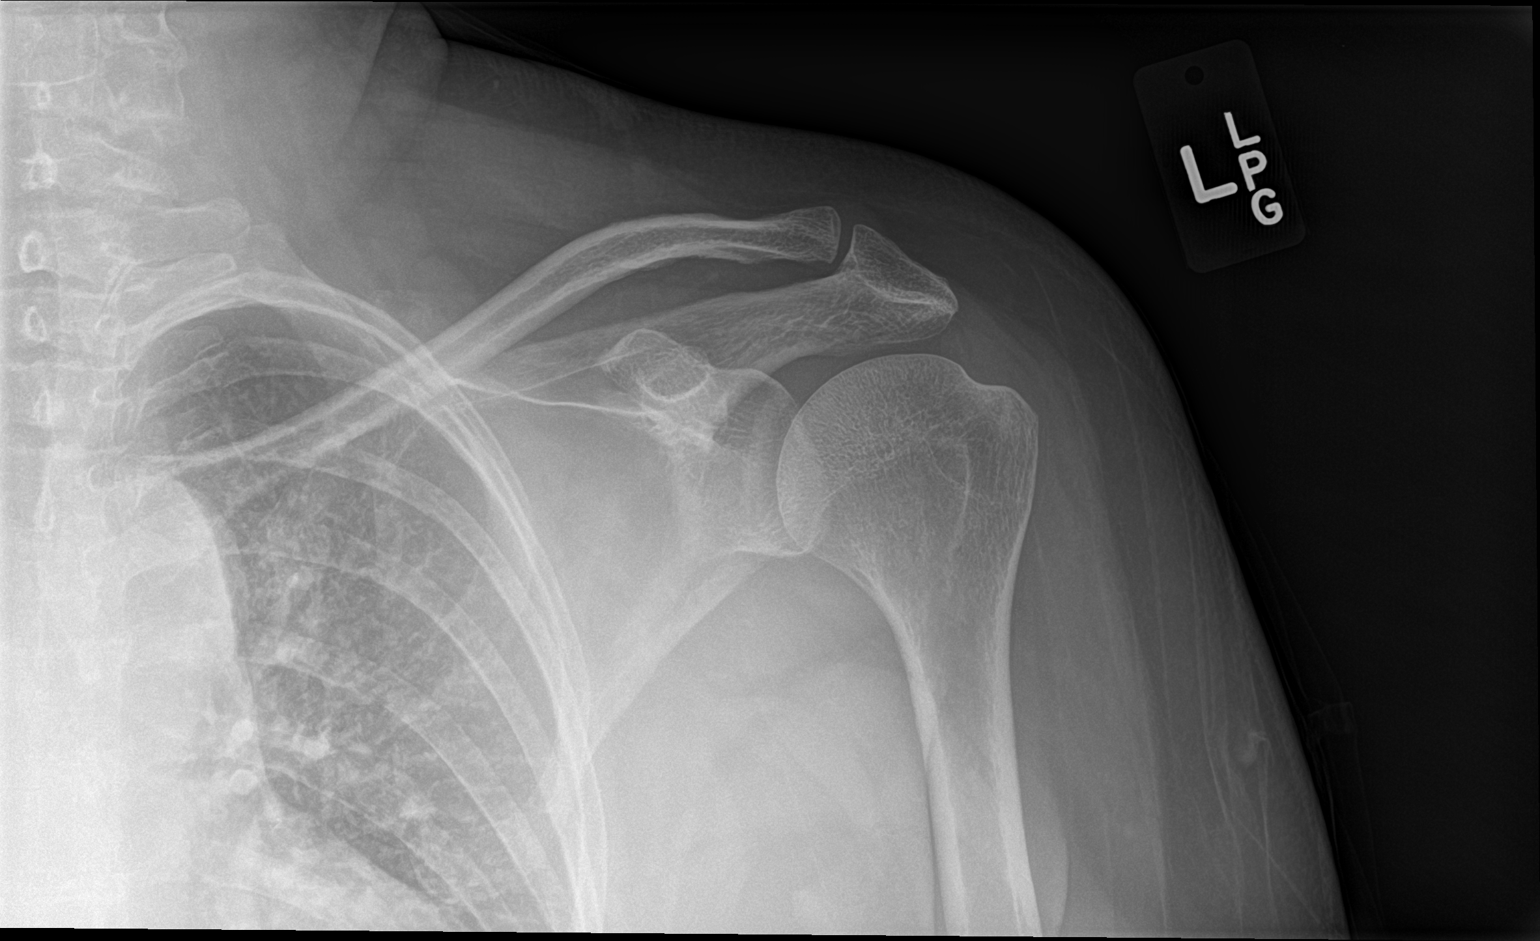

[shoulder y-view]
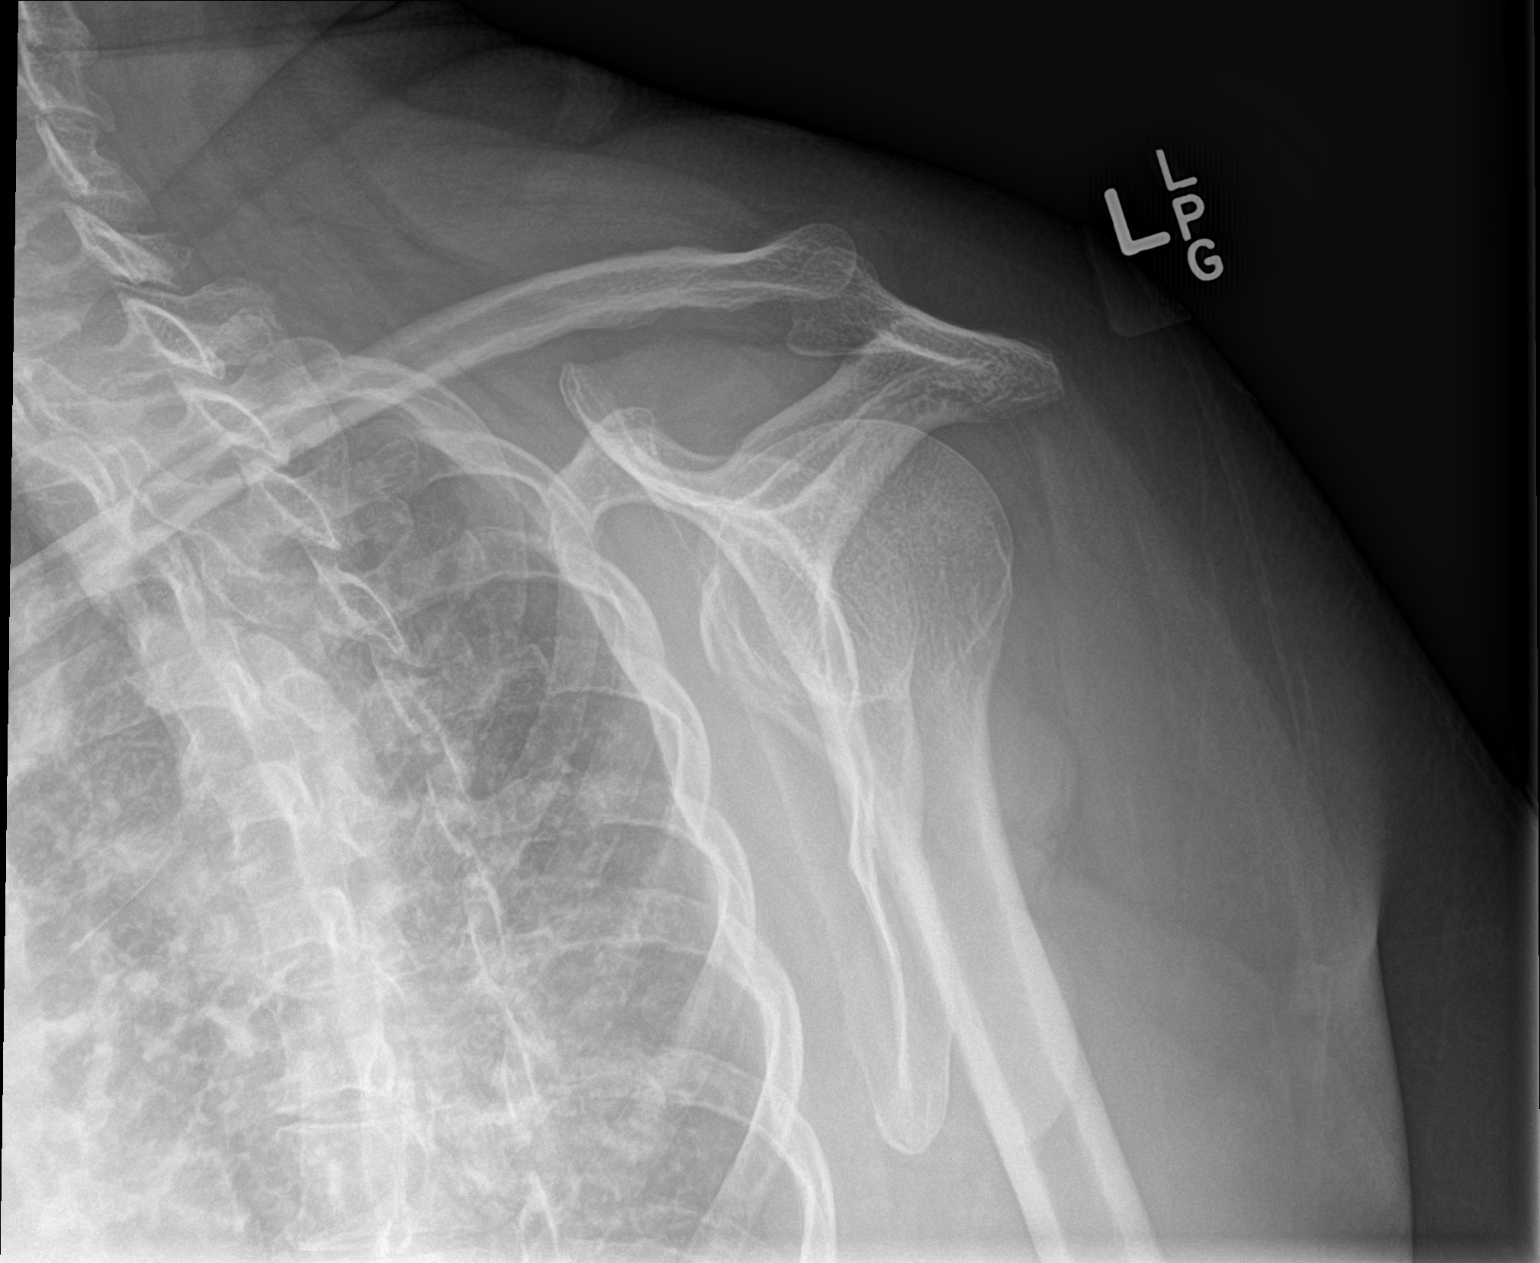

[3 of 3 positions shown; findings below may reference images not displayed]

FINDINGS: Internal rotation, external rotation, and Y scapular images were
obtained. There is no fracture or dislocation. Joint spaces appear
normal. No erosive change. Visualized left lung clear.
IMPRESSION: No fracture or dislocation.  No evident arthropathy.

## 2019-08-03 ENCOUNTER — Encounter: Payer: Self-pay | Admitting: Family Medicine

## 2019-08-03 ENCOUNTER — Ambulatory Visit: Payer: BC Managed Care – PPO | Admitting: Family Medicine

## 2019-08-03 ENCOUNTER — Other Ambulatory Visit: Payer: Self-pay

## 2019-08-03 VITALS — BP 140/90 | HR 84 | Temp 97.5°F | Resp 16 | Ht 63.0 in | Wt 189.0 lb

## 2019-08-03 DIAGNOSIS — M545 Low back pain, unspecified: Secondary | ICD-10-CM

## 2019-08-03 DIAGNOSIS — R7303 Prediabetes: Secondary | ICD-10-CM

## 2019-08-03 DIAGNOSIS — M2550 Pain in unspecified joint: Secondary | ICD-10-CM | POA: Diagnosis not present

## 2019-08-03 DIAGNOSIS — Z23 Encounter for immunization: Secondary | ICD-10-CM

## 2019-08-03 DIAGNOSIS — R7982 Elevated C-reactive protein (CRP): Secondary | ICD-10-CM

## 2019-08-03 DIAGNOSIS — Z6832 Body mass index (BMI) 32.0-32.9, adult: Secondary | ICD-10-CM

## 2019-08-03 DIAGNOSIS — R81 Glycosuria: Secondary | ICD-10-CM

## 2019-08-03 DIAGNOSIS — E6609 Other obesity due to excess calories: Secondary | ICD-10-CM

## 2019-08-03 DIAGNOSIS — Z87442 Personal history of urinary calculi: Secondary | ICD-10-CM | POA: Diagnosis not present

## 2019-08-03 DIAGNOSIS — M797 Fibromyalgia: Secondary | ICD-10-CM

## 2019-08-03 DIAGNOSIS — R3915 Urgency of urination: Secondary | ICD-10-CM

## 2019-08-03 LAB — POCT URINALYSIS DIPSTICK
Appearance: ABNORMAL
Bilirubin, UA: NEGATIVE
Blood, UA: NEGATIVE
Glucose, UA: POSITIVE — AB
Ketones, UA: NEGATIVE
Leukocytes, UA: NEGATIVE
Nitrite, UA: NEGATIVE
Odor: NORMAL
Protein, UA: NEGATIVE
Spec Grav, UA: 1.025 (ref 1.010–1.025)
Urobilinogen, UA: 0.2 E.U./dL
pH, UA: 6 (ref 5.0–8.0)

## 2019-08-03 LAB — GLUCOSE, POCT (MANUAL RESULT ENTRY): POC Glucose: 131 mg/dl — AB (ref 70–99)

## 2019-08-03 LAB — POCT GLYCOSYLATED HEMOGLOBIN (HGB A1C): HbA1c, POC (controlled diabetic range): 6.4 % (ref 0.0–7.0)

## 2019-08-03 MED ORDER — METAXALONE 800 MG PO TABS: 800.0000 mg | ORAL_TABLET | Freq: Three times a day (TID) | ORAL | 0 refills | Status: DC

## 2019-08-03 MED ORDER — CIPROFLOXACIN HCL 250 MG PO TABS
250.0000 mg | ORAL_TABLET | Freq: Two times a day (BID) | ORAL | 0 refills | Status: DC
Start: 2019-08-03 — End: 2019-11-05

## 2019-08-03 NOTE — Progress Notes (Signed)
Name: Yolanda Pace   MRN: [id number]    DOB: 10-Aug-1966   Date:08/03/2019       Progress Note  Subjective  Chief Complaint  Chief Complaint  Patient presents with  . Back Pain  . Urinary Tract Infection    HPI  Urinary urgency: she has a history of kidney cyst and kidney stone in the past, a complicated pelvic history, she had to have an infected vaginal mesh that was removed in 2019 and since than she has pelvic numbness for years ( since the mesh was placed) and has urinary urge continence, however since last week symptoms are much worse, having accidents because she cannot make it to the bathroom in time, also has noticed some urine odor. Past 5 days she has noticed severe mid low back pain, worse in the am.   FMS/elevated CRP: she was seen by Dr. Dossie Der in the past and would like to go back, states joint pains getting worse, also very fatigued, low back pain this week also severe. No rashes or joint swelling. Takes Duloxetine for FMS and used to take for MDD but in remission now. She has some flexeril at home but not taking because it causes somnolence, we will try skelaxin and place another referral   Pre-diabetes: glucose in urine, A1C today 6.4% and glucose non fasting 131 . She states not eating healthy lately also not physically active because of pain. She denies polyphagia or polydipsia but has urinary frequency from other problems. Discussed life style modification, she prefers not starting on medication at this time.   Patient Active Problem List   Diagnosis Date Noted  . Arthralgia 08/03/2019  . Gastric polyp   . Benign neoplasm of transverse colon   . Internal hemorrhoids   . Controlled substance agreement signed 05/12/2018  . 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 Kidney stone 02/20/2018  . Renal lesion 02/20/2018  . Benign hypertension 01/21/2018  . Pelvic pain 12/26/2017  . Major depression in remission ([place]) 12/26/2017  . GERD  (gastroesophageal reflux disease) 11/09/2017  . 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 Elevated C-reactive protein (CRP) 07/11/2017  . Right thyroid nodule 05/26/2017  . Mild hypercholesterolemia 05/06/2017  . Generalized osteoarthritis of hand 12/13/2016  . Impingement syndrome of shoulder region, left 11/29/2016  . Asthma   . Anxiety   . Allergic rhinitis   . Insomnia   . Obesity   . Vitamin D deficiency disease     Past Surgical History:  Procedure Laterality Date  . ABDOMINAL HYSTERECTOMY  2005ish   fibroids, ovaries remain; along with bladder tack  . bladder sling mesh removal  08/2018  . bladder tack  2005ish  . CHOLECYSTECTOMY    . COLONOSCOPY WITH PROPOFOL N/A 06/22/2018   Procedure: COLONOSCOPY WITH PROPOFOL;  Surgeon: Virgel Manifold, MD;  Location: [facility] ENDOSCOPY;  Service: Endoscopy;  Laterality: N/A;  . CYSTO WITH HYDRODISTENSION N/A 05/02/2018   Procedure: CYSTOSCOPY/HYDRODISTENSION;  Surgeon: Royston Cowper, MD;  Location: [facility] ORS;  Service: Urology;  Laterality: N/A;  . ESOPHAGOGASTRODUODENOSCOPY (EGD) WITH PROPOFOL N/A 06/22/2018   Procedure: ESOPHAGOGASTRODUODENOSCOPY (EGD) WITH PROPOFOL;  Surgeon: Virgel Manifold, MD;  Location: [facility] ENDOSCOPY;  Service: Endoscopy;  Laterality: N/A;  . INCONTINENCE SURGERY    . ROTATOR CUFF REPAIR Right 01/08/2019  . SKIN CANCER EXCISION  Aug 2015   removed from back    Family History  Problem Relation Age of Onset  . Hyperlipidemia Mother   . Diabetes Sister   .  Hyperlipidemia Sister   . Hypertension Sister   . Hypertension Brother   . Pneumonia Maternal Grandmother   . Heart attack Maternal Grandfather   . Diabetes Sister   . Hypertension Sister   . Epilepsy Sister   . Hypertension Sister   . Hypertension Daughter   . Kidney Stones Daughter   . Cancer Neg Hx   . COPD Neg Hx   . Stroke Neg Hx     Social History   Socioeconomic History  . Marital status: Married    Spouse name: Not on file  .  Number of children: 3  . Years of education: Not on file  . Highest education level: Some college, no degree  Occupational History  . Not on file  Social Needs  . Financial resource strain: Not hard at all  . Food insecurity    Worry: Never true    Inability: Never true  . Transportation needs    Medical: No    Non-medical: No  Tobacco Use  . Smoking status: Never Smoker  . Smokeless tobacco: Never Used  Substance and Sexual Activity  . Alcohol use: No  . Drug use: No  . Sexual activity: Yes  Lifestyle  . Physical activity    Days per week: 0 days    Minutes per session: 0 min  . Stress: Only a little  Relationships  . Social connections    Talks on phone: More than three times a week    Gets together: More than three times a week    Attends religious service: More than 4 times per year    Active member of club or organization: Yes    Attends meetings of clubs or organizations: 1 to 4 times per year    Relationship status: Married  . Intimate partner violence    Fear of current or ex partner: No    Emotionally abused: No    Physically abused: No    Forced sexual activity: No  Other Topics Concern  . Not on file  Social History Narrative  . Not on file     Current Outpatient Medications:  .  albuterol (PROAIR HFA) 108 (90 Base) MCG/ACT inhaler, Inhale 2 puffs into the lungs every 4 (four) hours as needed for wheezing or shortness of breath., Disp: 1 Inhaler, Rfl: 2 .  albuterol (PROVENTIL) (2.5 MG/3ML) 0.083% nebulizer solution, Take 3 mLs (2.5 mg total) by nebulization every 4 (four) hours as needed for wheezing or shortness of breath., Disp: 75 mL, Rfl: 1 .  amLODipine (NORVASC) 5 MG tablet, Take 1 tablet by mouth once daily, Disp: 90 tablet, Rfl: 1 .  Cholecalciferol (VITAMIN D) 2000 units tablet, Take 2,000 Units by mouth daily. , Disp: , Rfl:  .  dicyclomine (BENTYL) 10 MG capsule, Take 1 capsule (10 mg total) by mouth every 8 (eight) hours as needed for spasms.,  Disp: 15 capsule, Rfl: 1 .  DULoxetine (CYMBALTA) 60 MG capsule, Take 1 capsule (60 mg total) by mouth daily., Disp: 90 capsule, Rfl: 3 .  fexofenadine (ALLEGRA ALLERGY) 180 MG tablet, Take 1 tablet (180 mg total) by mouth daily., Disp: 90 tablet, Rfl: 3 .  ibuprofen (ADVIL,MOTRIN) 600 MG tablet, Take 600 mg by mouth daily with breakfast., Disp: , Rfl:  .  ketoconazole (NIZORAL) 2 % cream, APPLY CREAM TOPICALLY TO AFFECTED AREA UNDER THE BREASTS TWICE DAILY UNTIL IMPROVED, Disp: , Rfl:  .  mometasone (NASONEX) 50 MCG/ACT nasal spray, Place 2 sprays into the nose  daily., Disp: 17 g, Rfl: 12 .  montelukast (SINGULAIR) 10 MG tablet, TAKE 1 TABLET BY MOUTH AT BEDTIME, Disp: 90 tablet, Rfl: 2 .  omeprazole (PRILOSEC) 20 MG capsule, Take 20 mg by mouth every morning. , Disp: , Rfl:  .  ciprofloxacin (CIPRO) 250 MG tablet, Take 1 tablet (250 mg total) by mouth 2 (two) times daily., Disp: 6 tablet, Rfl: 0 .  metaxalone (SKELAXIN) 800 MG tablet, Take 1 tablet (800 mg total) by mouth 3 (three) times daily., Disp: 90 tablet, Rfl: 0  Allergies  Allergen Reactions  . Other     Mescalor: vomiting   . Codeine Nausea And Vomiting  . Compazine  [Prochlorperazine] Other (See Comments)    Jaws locked up  . Contrast Media [Iodinated Diagnostic Agents] Swelling    Noted in [facility] chart on 01/06/2012. Premedicate patient prior to contrast media injections.  . Doxycycline Nausea Only    GI upset  . Iodine Swelling  . Sulfur Other (See Comments)    Made all blood vessels in eyes burst    I personally reviewed active problem list, medication list, allergies, family history, social history, health maintenance with the patient/caregiver today.   ROS  Constitutional: Negative for fever or weight change.  Respiratory: Negative for cough and shortness of breath.   Cardiovascular: Negative for chest pain or palpitations.  Gastrointestinal: Negative for abdominal pain, no bowel changes.  Musculoskeletal:  positive  for gait problem or joint swelling.  Skin: Negative for rash.  Neurological: Negative for dizziness or headache.  No other specific complaints in a complete review of systems (except as listed in HPI above).   Objective  Vitals:   08/03/19 1501  BP: 140/90  Pulse: 84  Resp: 16  Temp: (!) 97.5 F (36.4 C)  TempSrc: Temporal  SpO2: 96%  Weight: 189 lb (85.7 kg)  Height: 5\' 3"  (1.6 m)    Body mass index is 33.48 kg/m.  Physical Exam  Constitutional: Patient appears well-developed and well-nourished. Obese  No distress.  HEENT: head atraumatic, normocephalic, pupils equal and reactive to light,  neck supple Cardiovascular: Normal rate, regular rhythm and normal heart sounds.  No murmur heard. No BLE edema. Pulmonary/Chest: Effort normal and breath sounds normal. No respiratory distress. Abdominal: Soft.  There is suprapubic tenderness, negative CVA tenderness Muscular Skeletal: trigger point positive, moves slowly, tender during palpation of lumbar spine, negative straight leg raise Psychiatric: Patient has a normal mood and affect. behavior is normal. Judgment and thought content normal.  Recent Results (from the past 2160 hour(s))  POCT Urinalysis Dipstick     Status: Abnormal   Collection Time: 08/03/19  3:23 PM  Result Value Ref Range   Color, UA Yellow    Clarity, UA Cloudy    Glucose, UA Positive (A) Negative   Bilirubin, UA Negative    Ketones, UA negative    Spec Grav, UA 1.025 1.010 - 1.025   Blood, UA Negative    pH, UA 6.0 5.0 - 8.0   Protein, UA Negative Negative   Urobilinogen, UA 0.2 0.2 or 1.0 E.U./dL   Nitrite, UA Negative    Leukocytes, UA Negative Negative   Appearance abnormal    Odor normal       PHQ2/9: Depression screen [facility] 2/9 08/03/2019 03/13/2019 01/19/2019 08/14/2018 08/14/2018  Decreased Interest 0 0 0 - 0  Down, Depressed, Hopeless 0 0 0 0 0  PHQ - 2 Score 0 0 0 0 0  Altered sleeping 0 0  0 2 -  Tired, decreased energy 0 0 0 3 -   Change in appetite 0 0 0 3 -  Feeling bad or failure about yourself  0 0 0 0 -  Trouble concentrating 0 0 0 0 -  Moving slowly or fidgety/restless 0 0 0 0 -  Suicidal thoughts 0 0 0 0 -  PHQ-9 Score 0 0 0 8 -  Difficult doing work/chores - Not difficult at all Not difficult at all Very difficult -  Some recent data might be hidden    phq 9 is negative   Fall Risk: Fall Risk  03/13/2019 01/19/2019 08/14/2018 06/12/2018 05/12/2018  Falls in the past year? 1 0 Yes No Yes  Number falls in past yr: 0 0 1 - 1  Injury with Fall? 0 0 Yes - Yes    Functional Status Survey: Is the patient deaf or have difficulty hearing?: No Does the patient have difficulty seeing, even when wearing glasses/contacts?: No Does the patient have difficulty concentrating, remembering, or making decisions?: No Does the patient have difficulty walking or climbing stairs?: No Does the patient have difficulty dressing or bathing?: No Does the patient have difficulty doing errands alone such as visiting a doctor's office or shopping?: No    Assessment & Plan  1. Acute midline low back pain without sciatica  - POCT Urinalysis Dipstick - metaxalone (SKELAXIN) 800 MG tablet; Take 1 tablet (800 mg total) by mouth 3 (three) times daily.  Dispense: 90 tablet; Refill: 0  2. Arthralgia, unspecified joint  - Ambulatory referral to Rheumatology  3. Urinary urgency  - CULTURE, URINE COMPREHENSIVE - ciprofloxacin (CIPRO) 250 MG tablet; Take 1 tablet (250 mg total) by mouth 2 (two) times daily.  Dispense: 6 tablet; Refill: 0  4. History of kidney stones   5. Elevated C-reactive protein (CRP)  - Ambulatory referral to Rheumatology  6. Fibromyalgia  - metaxalone (SKELAXIN) 800 MG tablet; Take 1 tablet (800 mg total) by mouth 3 (three) times daily.  Dispense: 90 tablet; Refill: 0  7. Glycosuria  - POCT Glucose (CBG) - POCT HgB A1C  8. Obesity   - POCT Glucose (CBG) - POCT HgB A1C  9. Needs flu  shot  refused  10. Pre-diabetes  Discussed life style modification

## 2019-08-03 NOTE — Patient Instructions (Signed)
Read about GLP-1 agonist Ozempic or Trulicity for pre-diabetes and weight loss

## 2019-08-05 LAB — CULTURE, URINE COMPREHENSIVE
MICRO NUMBER:: 898910
RESULT:: NO GROWTH
SPECIMEN QUALITY:: ADEQUATE

## 2019-08-06 ENCOUNTER — Other Ambulatory Visit: Payer: Self-pay | Admitting: Family Medicine

## 2019-08-06 NOTE — Telephone Encounter (Signed)
Requested medication (s) are due for refill today: yes  Requested medication (s) are on the active medication list: yes  Last refill:  05/01/2019  Future visit scheduled: yes  Notes to clinic: ordering provider is different from pcp   Requested Prescriptions  Pending Prescriptions Disp Refills   montelukast (SINGULAIR) 10 MG tablet [Pharmacy Med Name: Montelukast Sodium 10 MG Oral Tablet] 90 tablet 0    Sig: TAKE 1 TABLET BY MOUTH AT BEDTIME     Pulmonology:  Leukotriene Inhibitors Passed - 08/06/2019  5:31 AM      Passed - Valid encounter within last 12 months    Recent Outpatient Visits          3 days ago Acute midline low back pain without sciatica   [facility] Steele Sizer, MD   4 months ago Fever, unspecified fever cause   [place], Bethel Born, NP   6 months ago Burning with urination   [facility] [place], Satira Anis, MD   11 months ago Pre-op evaluation   [facility] [place], Satira Anis, MD   1 year ago Pain, joint, multiple sites   [place], Satira Anis, MD      Future Appointments            In 3 months Ancil Boozer, Drue Stager, MD [facility], [facility]            levocetirizine (XYZAL) 5 MG tablet [Pharmacy Med Name: Levocetirizine Dihydrochloride 5 MG Oral Tablet] 90 tablet 0    Sig: TAKE 1 TABLET BY MOUTH ONCE DAILY IN THE EVENING     Ear, Nose, and Throat:  Antihistamines Passed - 08/06/2019  5:31 AM      Passed - Valid encounter within last 12 months    Recent Outpatient Visits          3 days ago Acute midline low back pain without sciatica   [facility] Steele Sizer, MD   4 months ago Fever, unspecified fever cause   [place], NP   6 months ago Burning with urination   [place], Satira Anis, MD   11 months ago Pre-op evaluation   [facility] [place], Satira Anis, MD   1 year ago Pain, joint, multiple sites   [place], MD      Future Appointments            In 3 months Ancil Boozer, Drue Stager, MD [facility], [facility]

## 2019-08-13 ENCOUNTER — Other Ambulatory Visit: Payer: Self-pay | Admitting: Family Medicine

## 2019-08-13 NOTE — Telephone Encounter (Signed)
Requested medication (s) are due for refill today: yes  Requested medication (s) are on the active medication list: yes  Last refill:  05/02/2019  Future visit scheduled: yes  Notes to clinic:  Ordering provider different than pcp   Requested Prescriptions  Pending Prescriptions Disp Refills   amLODipine (NORVASC) 5 MG tablet [Pharmacy Med Name: amLODIPine Besylate 5 MG Oral Tablet] 90 tablet 0    Sig: Take 1 tablet by mouth once daily     Cardiovascular:  Calcium Channel Blockers Failed - 08/13/2019  5:31 AM      Failed - Last BP in normal range    BP Readings from Last 1 Encounters:  08/03/19 140/90         Passed - Valid encounter within last 6 months    Recent Outpatient Visits          1 week ago Acute midline low back pain without sciatica   [facility] Steele Sizer, MD   5 months ago Fever, unspecified fever cause   [place], Bethel Born, NP   6 months ago Burning with urination   [place], Satira Anis, MD   12 months ago Pre-op evaluation   [facility] [place], Satira Anis, MD   1 year ago Pain, joint, multiple sites   [place], Satira Anis, MD      Future Appointments            In 2 months Steele Sizer, MD [facility], [facility]            DULoxetine (CYMBALTA) 60 MG capsule [Pharmacy Med Name: DULoxetine HCl 60 MG Oral Capsule Delayed Release Particles] 90 capsule 0    Sig: Take 1 capsule by mouth once daily     Psychiatry: Antidepressants - SNRI Failed - 08/13/2019  5:31 AM      Failed - Last BP in normal range    BP Readings from Last 1 Encounters:  08/03/19 140/90         Passed - Valid encounter within last 6 months    Recent Outpatient Visits          1 week ago Acute midline low back pain without sciatica   [facility] Steele Sizer, MD   5 months ago Fever, unspecified fever cause   [facility] Fredderick Severance, NP   6 months ago Burning with urination   [facility] [place], Satira Anis, MD   12 months ago Pre-op evaluation   [facility] [place], Satira Anis, MD   1 year ago Pain, joint, multiple sites   [place], Satira Anis, MD      Future Appointments            In 2 months Steele Sizer, MD [facility], [place] - Completed PHQ-2 or PHQ-9 in the last 360 days.

## 2019-08-16 ENCOUNTER — Encounter: Payer: Self-pay | Admitting: Family Medicine

## 2019-08-22 LAB — CBC AND DIFFERENTIAL
HCT: 41 (ref 36–46)
Hemoglobin: 13.2 (ref 12.0–16.0)
WBC: 8.1

## 2019-08-22 LAB — IRON,TIBC AND FERRITIN PANEL: UIBC: 4.6

## 2019-08-22 LAB — BASIC METABOLIC PANEL: Glucose: 207

## 2019-08-22 LAB — POCT ERYTHROCYTE SEDIMENTATION RATE, NON-AUTOMATED: Sed Rate: 5

## 2019-08-23 ENCOUNTER — Telehealth: Payer: Self-pay

## 2019-08-23 NOTE — Telephone Encounter (Signed)
Copied from [place] [id number]. Topic: General - Other >> Aug 22, 2019  4:50 PM Rainey Pines A wrote: [facility] called in regards to patients referral. Patient was scheduled for a future follow up appt 10/28 and wanted to notify Dr. Ancil Boozer for [place]. Best contact [phone]   Called [facility] and spoke to [place] then was transferred to [place] where a message was left asking her to give [place] a call back to let us know how we can be of assistance to them regarding her referral.

## 2019-09-10 ENCOUNTER — Encounter: Payer: Self-pay | Admitting: Family Medicine

## 2019-11-05 ENCOUNTER — Other Ambulatory Visit: Payer: Self-pay

## 2019-11-05 ENCOUNTER — Ambulatory Visit: Payer: BC Managed Care – PPO | Admitting: Family Medicine

## 2019-11-05 ENCOUNTER — Encounter: Payer: Self-pay | Admitting: Family Medicine

## 2019-11-05 VITALS — BP 130/82 | HR 85 | Temp 97.5°F | Resp 16 | Ht 63.0 in | Wt 190.4 lb

## 2019-11-05 DIAGNOSIS — M797 Fibromyalgia: Secondary | ICD-10-CM

## 2019-11-05 DIAGNOSIS — I10 Essential (primary) hypertension: Secondary | ICD-10-CM

## 2019-11-05 DIAGNOSIS — E6609 Other obesity due to excess calories: Secondary | ICD-10-CM

## 2019-11-05 DIAGNOSIS — Z23 Encounter for immunization: Secondary | ICD-10-CM | POA: Diagnosis not present

## 2019-11-05 DIAGNOSIS — R7303 Prediabetes: Secondary | ICD-10-CM

## 2019-11-05 DIAGNOSIS — R7989 Other specified abnormal findings of blood chemistry: Secondary | ICD-10-CM

## 2019-11-05 DIAGNOSIS — J4520 Mild intermittent asthma, uncomplicated: Secondary | ICD-10-CM

## 2019-11-05 DIAGNOSIS — Z87442 Personal history of urinary calculi: Secondary | ICD-10-CM

## 2019-11-05 DIAGNOSIS — M159 Polyosteoarthritis, unspecified: Secondary | ICD-10-CM

## 2019-11-05 DIAGNOSIS — F325 Major depressive disorder, single episode, in full remission: Secondary | ICD-10-CM

## 2019-11-05 DIAGNOSIS — J302 Other seasonal allergic rhinitis: Secondary | ICD-10-CM

## 2019-11-05 DIAGNOSIS — R768 Other specified abnormal immunological findings in serum: Secondary | ICD-10-CM

## 2019-11-05 DIAGNOSIS — Z6833 Body mass index (BMI) 33.0-33.9, adult: Secondary | ICD-10-CM

## 2019-11-05 DIAGNOSIS — J3089 Other allergic rhinitis: Secondary | ICD-10-CM

## 2019-11-05 LAB — POCT GLYCOSYLATED HEMOGLOBIN (HGB A1C): Hemoglobin A1C: 6.1 % — AB (ref 4.0–5.6)

## 2019-11-05 MED ORDER — DULOXETINE HCL 60 MG PO CPEP: 60.0000 mg | ORAL_CAPSULE | Freq: Every day | ORAL | 1 refills | Status: DC

## 2019-11-05 MED ORDER — AMLODIPINE BESYLATE 5 MG PO TABS: 5.0000 mg | ORAL_TABLET | Freq: Every day | ORAL | 0 refills | Status: DC

## 2019-11-05 MED ORDER — LEVOCETIRIZINE DIHYDROCHLORIDE 5 MG PO TABS: 5.0000 mg | ORAL_TABLET | Freq: Every evening | ORAL | 0 refills | Status: DC

## 2019-11-05 MED ORDER — FEXOFENADINE HCL 180 MG PO TABS: 180.0000 mg | ORAL_TABLET | Freq: Every day | ORAL | 1 refills | Status: DC

## 2019-11-05 MED ORDER — MONTELUKAST SODIUM 10 MG PO TABS: 10.0000 mg | ORAL_TABLET | Freq: Every day | ORAL | 1 refills | Status: DC

## 2019-11-05 NOTE — Progress Notes (Signed)
Name: Yolanda Pace   MRN: [id number]    DOB: 1966/03/08   Date:11/06/2019       Progress Note  Subjective  Chief Complaint  Chief Complaint  Patient presents with  . Prediabetes    Patient states she is here for a recheck on glucose because it was elevated last visit.  Marland Kitchen Results    Has new dx of osteoarthritis.  . Depression  . Gastroesophageal Reflux    HPI   FMS/elevated CRP: she was seen by Dr. Jefferson Fuel in the past and we mistakenly sent her to see Dr Dossie Der this Fall. She states x-rays were done and also labs , inflammatory markers improved and she was given reassurance that is it OA Takes Duloxetine seems to help with pain. He states no recent episodes of severe fatigue. She takes skelaxin prn. Discussed lyrica but she wants to hold off for now  Left shoulder pain: seeing Isabel Caprice, she had a work related injury and is going for a revision of left shoulder surgery January 5 th 2021   Pre-diabetes: glucose in urine, A1C today 6.4% and glucose non fasting 131, followed by another glucose of 203.  Since last visit she has changed her diet, she is cutting down on sweets and carbs, she has been trying to walk her dogs twice a week, not as active because of joint pains  Depression in remission: doing well on duloxetine, she states she feels better than she has in years. She was diagnosed at age 80  - sudden onset after hysterectomy/bladder sling. She states took years to get back to normal. She was on zoloft for many years, switched to duloxetine because of fms and joint problems and is doing well on medication   GERD: controlled with medication, denies heartburn or indigestion   Patient Active Problem List   Diagnosis Date Noted  . Arthralgia 08/03/2019  . Gastric polyp   . Benign neoplasm of transverse colon   . Internal hemorrhoids   . Controlled substance agreement signed 05/12/2018  . 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  Kidney stone 02/20/2018  . Renal lesion 02/20/2018  . Benign hypertension 01/21/2018  . Pelvic pain 12/26/2017  . Major depression in remission ([place]) 12/26/2017  . GERD (gastroesophageal reflux disease) 11/09/2017  . 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 Elevated C-reactive protein (CRP) 07/11/2017  . Right thyroid nodule 05/26/2017  . Mild hypercholesterolemia 05/06/2017  . Generalized osteoarthritis of hand 12/13/2016  . Impingement syndrome of shoulder region, left 11/29/2016  . Asthma   . Anxiety   . Allergic rhinitis   . Insomnia   . Obesity   . Vitamin D deficiency disease     Past Surgical History:  Procedure Laterality Date  . ABDOMINAL HYSTERECTOMY  2005ish   fibroids, ovaries remain; along with bladder tack  . bladder sling mesh removal  08/2018  . bladder tack  2005ish  . CHOLECYSTECTOMY    . COLONOSCOPY WITH PROPOFOL N/A 06/22/2018   Procedure: COLONOSCOPY WITH PROPOFOL;  Surgeon: Virgel Manifold, MD;  Location: [facility] ENDOSCOPY;  Service: Endoscopy;  Laterality: N/A;  . CYSTO WITH HYDRODISTENSION N/A 05/02/2018   Procedure: CYSTOSCOPY/HYDRODISTENSION;  Surgeon: Royston Cowper, MD;  Location: [facility] ORS;  Service: Urology;  Laterality: N/A;  . ESOPHAGOGASTRODUODENOSCOPY (EGD) WITH PROPOFOL N/A 06/22/2018   Procedure: ESOPHAGOGASTRODUODENOSCOPY (EGD) WITH PROPOFOL;  Surgeon: Virgel Manifold, MD;  Location: [facility] ENDOSCOPY;  Service: Endoscopy;  Laterality: N/A;  . INCONTINENCE SURGERY    .  ROTATOR CUFF REPAIR Right 01/08/2019  . SKIN CANCER EXCISION  Aug 2015   removed from back    Family History  Problem Relation Age of Onset  . Hyperlipidemia Mother   . Diabetes Sister   . Hyperlipidemia Sister   . Hypertension Sister   . Hypertension Brother   . Pneumonia Maternal Grandmother   . Heart attack Maternal Grandfather   . Diabetes Sister   . Hypertension Sister   . Epilepsy Sister   . Hypertension Sister   . Hypertension Daughter   . Kidney Stones  Daughter   . Cancer Neg Hx   . COPD Neg Hx   . Stroke Neg Hx     Social History   Socioeconomic History  . Marital status: Married    Spouse name: Not on file  . Number of children: 3  . Years of education: Not on file  . Highest education level: Some college, no degree  Occupational History  . Not on file  Tobacco Use  . Smoking status: Never Smoker  . Smokeless tobacco: Never Used  Substance and Sexual Activity  . Alcohol use: No  . Drug use: No  . Sexual activity: Yes  Other Topics Concern  . Not on file  Social History Narrative  . Not on file   Social Determinants of Health   Financial Resource Strain: Low Risk   . Difficulty of Paying Living Expenses: Not hard at all  Food Insecurity: No Food Insecurity  . Worried About Charity fundraiser in the Last Year: Never true  . Ran Out of Food in the Last Year: Never true  Transportation Needs: No Transportation Needs  . Lack of Transportation (Medical): No  . Lack of Transportation (Non-Medical): No  Physical Activity: Unknown  . Days of Exercise per Week: Not on file  . Minutes of Exercise per Session: 0 min  Stress:   . Feeling of Stress : Not on file  Social Connections: Not Isolated  . Frequency of Communication with Friends and Family: More than three times a week  . Frequency of Social Gatherings with Friends and Family: More than three times a week  . Attends Religious Services: More than 4 times per year  . Active Member of Clubs or Organizations: Yes  . Attends Archivist Meetings: 1 to 4 times per year  . Marital Status: Married  Human resources officer Violence: Not At Risk  . Fear of Current or Ex-Partner: No  . Emotionally Abused: No  . Physically Abused: No  . Sexually Abused: No     Current Outpatient Medications:  .  albuterol (PROAIR HFA) 108 (90 Base) MCG/ACT inhaler, Inhale 2 puffs into the lungs every 4 (four) hours as needed for wheezing or shortness of breath., Disp: 1 Inhaler, Rfl:  2 .  albuterol (PROVENTIL) (2.5 MG/3ML) 0.083% nebulizer solution, Take 3 mLs (2.5 mg total) by nebulization every 4 (four) hours as needed for wheezing or shortness of breath., Disp: 75 mL, Rfl: 1 .  amLODipine (NORVASC) 5 MG tablet, Take 1 tablet (5 mg total) by mouth daily., Disp: 90 tablet, Rfl: 0 .  Cholecalciferol (VITAMIN D) 2000 units tablet, Take 2,000 Units by mouth daily. , Disp: , Rfl:  .  diclofenac Sodium (VOLTAREN) 1 % GEL, SMARTSIG:1 Sparingly Topical 3 Times Daily, Disp: , Rfl:  .  DULoxetine (CYMBALTA) 60 MG capsule, Take 1 capsule (60 mg total) by mouth daily., Disp: 90 capsule, Rfl: 1 .  fexofenadine (ALLEGRA ALLERGY) 180  MG tablet, Take 1 tablet (180 mg total) by mouth daily., Disp: 90 tablet, Rfl: 1 .  ibuprofen (ADVIL,MOTRIN) 600 MG tablet, Take 600 mg by mouth daily with breakfast., Disp: , Rfl:  .  ketoconazole (NIZORAL) 2 % cream, APPLY CREAM TOPICALLY TO AFFECTED AREA UNDER THE BREASTS TWICE DAILY UNTIL IMPROVED, Disp: , Rfl:  .  levocetirizine (XYZAL) 5 MG tablet, Take 1 tablet (5 mg total) by mouth every evening., Disp: 90 tablet, Rfl: 0 .  metaxalone (SKELAXIN) 800 MG tablet, Take 1 tablet (800 mg total) by mouth 3 (three) times daily., Disp: 90 tablet, Rfl: 0 .  montelukast (SINGULAIR) 10 MG tablet, Take 1 tablet (10 mg total) by mouth at bedtime., Disp: 90 tablet, Rfl: 1 .  omeprazole (PRILOSEC) 20 MG capsule, Take 20 mg by mouth every morning. , Disp: , Rfl:   Allergies  Allergen Reactions  . Other     [place]: vomiting   . Codeine Nausea And Vomiting  . Compazine  [Prochlorperazine] Other (See Comments)    Jaws locked up  . Contrast Media [Iodinated Diagnostic Agents] Swelling    Noted in [facility] chart on 01/06/2012. Premedicate patient prior to contrast media injections.  . Doxycycline Nausea Only    GI upset  . Iodine Swelling  . Sulfur Other (See Comments)    Made all blood vessels in eyes burst    I personally reviewed active problem list,  medication list, allergies, family history, social history, health maintenance with the patient/caregiver today.   ROS  Constitutional: Negative for fever or weight change.  Respiratory: Negative for cough and shortness of breath.   Cardiovascular: Negative for chest pain or palpitations.  Gastrointestinal: Negative for abdominal pain, no bowel changes.  Musculoskeletal: Negative for gait problem , positive for intermittent  joint swelling.  Skin: Negative for rash.  Neurological: Negative for dizziness or headache.  No other specific complaints in a complete review of systems (except as listed in HPI above).  Objective  Vitals:   11/05/19 1523  BP: 130/82  Pulse: 85  Resp: 16  Temp: (!) 97.5 F (36.4 C)  TempSrc: Temporal  SpO2: 97%  Weight: 190 lb 6.4 oz (86.4 kg)  Height: 5\' 3"  (1.6 m)    Body mass index is 33.73 kg/m.  Physical Exam  Constitutional: Patient appears well-developed and well-nourished. Obese  No distress.  HEENT: head atraumatic, normocephalic, pupils equal and reactive to light Cardiovascular: Normal rate, regular rhythm and normal heart sounds.  No murmur heard. No BLE edema. Pulmonary/Chest: Effort normal and breath sounds normal. No respiratory distress. Abdominal: Soft.  There is no tenderness. Psychiatric: Patient has a normal mood and affect. behavior is normal. Judgment and thought content normal.  Recent Results (from the past 2160 hour(s))  Iron, TIBC and Ferritin Panel     Status: None   Collection Time: 08/22/19 12:00 AM  Result Value Ref Range   UIBC 4.6   CBC and differential     Status: None   Collection Time: 08/22/19 12:00 AM  Result Value Ref Range   Hemoglobin 13.2 12.0 - 16.0   HCT 41 36 - 46   WBC 8.1   POCT erythrocyte sed rate, Non-automated     Status: None   Collection Time: 08/22/19 12:00 AM  Result Value Ref Range   Sed Rate 5   Basic metabolic panel     Status: None   Collection Time: 08/22/19 12:00 AM  Result  Value Ref Range   Glucose  207   POCT HgB A1C     Status: Abnormal   Collection Time: 11/05/19  3:59 PM  Result Value Ref Range   Hemoglobin A1C 6.1 (A) 4.0 - 5.6 %   HbA1c POC (<> result, manual entry)     HbA1c, POC (prediabetic range)     HbA1c, POC (controlled diabetic range)       PHQ2/9: Depression screen [facility] 2/9 11/05/2019 08/03/2019 03/13/2019 01/19/2019 08/14/2018  Decreased Interest 0 0 0 0 -  Down, Depressed, Hopeless 0 0 0 0 0  PHQ - 2 Score 0 0 0 0 0  Altered sleeping 0 0 0 0 2  Tired, decreased energy 0 0 0 0 3  Change in appetite 0 0 0 0 3  Feeling bad or failure about yourself  0 0 0 0 0  Trouble concentrating 0 0 0 0 0  Moving slowly or fidgety/restless 0 0 0 0 0  Suicidal thoughts 0 0 0 0 0  PHQ-9 Score 0 0 0 0 8  Difficult doing work/chores - - Not difficult at all Not difficult at all Very difficult  Some recent data might be hidden    phq 9 is negative   Fall Risk: Fall Risk  11/05/2019 03/13/2019 01/19/2019 08/14/2018 06/12/2018  Falls in the past year? 0 1 0 Yes No  Number falls in past yr: 0 0 0 1 -  Injury with Fall? 0 0 0 Yes -     Functional Status Survey: Is the patient deaf or have difficulty hearing?: No Does the patient have difficulty seeing, even when wearing glasses/contacts?: No Does the patient have difficulty concentrating, remembering, or making decisions?: No Does the patient have difficulty walking or climbing stairs?: No Does the patient have difficulty dressing or bathing?: No Does the patient have difficulty doing errands alone such as visiting a doctor's office or shopping?: No    Assessment & Plan  1. Depression, major, in remission ([place])  - DULoxetine (CYMBALTA) 60 MG capsule; Take 1 capsule (60 mg total) by mouth daily.  Dispense: 90 capsule; Refill: 1  2. Need for Tdap vaccination  - Tdap vaccine greater than or equal to 7yo IM  3. Fibromyalgia  Duloxetine  4. Class 1 obesity due to excess calories without serious  comorbidity with body mass index (BMI) of 33.0 to 33.9 in adult  Eating healthier, trying to be more active   5. Cyclic citrullinated peptide (CCP) antibody positive  Seen rheumatologist, normal repeat levels and we will obtain a copy of results   6. Pre-diabetes  - POCT HgB A1C  7. History of kidney stones   8. Benign hypertension  - amLODipine (NORVASC) 5 MG tablet; Take 1 tablet (5 mg total) by mouth daily.  Dispense: 90 tablet; Refill: 0  9. Generalized osteoarthritis of hand  - diclofenac Sodium (VOLTAREN) 1 % GEL; SMARTSIG:1 Sparingly Topical 3 Times Daily  10. Asthma, mild intermittent, well-controlled  - montelukast (SINGULAIR) 10 MG tablet; Take 1 tablet (10 mg total) by mouth at bedtime.  Dispense: 90 tablet; Refill: 1  11. Perennial allergic rhinitis with seasonal variation  - montelukast (SINGULAIR) 10 MG tablet; Take 1 tablet (10 mg total) by mouth at bedtime.  Dispense: 90 tablet; Refill: 1 - levocetirizine (XYZAL) 5 MG tablet; Take 1 tablet (5 mg total) by mouth every evening.  Dispense: 90 tablet; Refill: 0 - fexofenadine (ALLEGRA ALLERGY) 180 MG tablet; Take 1 tablet (180 mg total) by mouth daily.  Dispense: 90 tablet; Refill:  1 

## 2019-12-06 HISTORY — PX: OTHER SURGICAL HISTORY: SHX169

## 2019-12-27 ENCOUNTER — Other Ambulatory Visit: Payer: Self-pay | Admitting: Orthopedic Surgery

## 2019-12-27 DIAGNOSIS — M545 Low back pain, unspecified: Secondary | ICD-10-CM

## 2019-12-28 ENCOUNTER — Other Ambulatory Visit: Payer: Self-pay

## 2019-12-28 ENCOUNTER — Ambulatory Visit
Admission: RE | Admit: 2019-12-28 | Discharge: 2019-12-28 | Disposition: A | Discharge status: Home / Self Care | Payer: BC Managed Care – PPO | Source: Ambulatory Visit | Attending: Orthopedic Surgery | Admitting: Orthopedic Surgery

## 2019-12-28 DIAGNOSIS — M545 Low back pain, unspecified: Secondary | ICD-10-CM

## 2020-02-04 ENCOUNTER — Encounter: Payer: Self-pay | Admitting: Family Medicine

## 2020-02-06 ENCOUNTER — Other Ambulatory Visit: Payer: Self-pay | Admitting: Family Medicine

## 2020-02-06 DIAGNOSIS — J3089 Other allergic rhinitis: Secondary | ICD-10-CM

## 2020-02-06 DIAGNOSIS — J302 Other seasonal allergic rhinitis: Secondary | ICD-10-CM

## 2020-02-06 DIAGNOSIS — I10 Essential (primary) hypertension: Secondary | ICD-10-CM

## 2020-02-06 NOTE — Telephone Encounter (Signed)
Requested Prescriptions  Pending Prescriptions Disp Refills  . levocetirizine (XYZAL) 5 MG tablet [Pharmacy Med Name: Levocetirizine Dihydrochloride 5 MG Oral Tablet] 90 tablet 0    Sig: TAKE 1 TABLET BY MOUTH ONCE DAILY IN THE EVENING     Ear, Nose, and Throat:  Antihistamines Passed - 02/06/2020  5:30 AM      Passed - Valid encounter within last 12 months    Recent Outpatient Visits          3 months ago Depression, major, in remission [facility])   [facility] [place], Drue Stager, MD   6 months ago Acute midline low back pain without sciatica   [facility] Steele Sizer, MD   11 months ago Fever, unspecified fever cause   [place], Bethel Born, NP   1 year ago Burning with urination   [place], Satira Anis, MD   1 year ago Pre-op evaluation   [facility] [place], Satira Anis, MD      Future Appointments            In 2 months Steele Sizer, MD [facility]           . amLODipine (NORVASC) 5 MG tablet [Pharmacy Med Name: amLODIPine Besylate 5 MG Oral Tablet] 90 tablet 0    Sig: Take 1 tablet by mouth once daily     Cardiovascular:  Calcium Channel Blockers Passed - 02/06/2020  5:30 AM      Passed - Last BP in normal range    BP Readings from Last 1 Encounters:  11/05/19 130/82         Passed - Valid encounter within last 6 months    Recent Outpatient Visits          3 months ago Depression, major, in remission [facility])   [facility] [place], Drue Stager, MD   6 months ago Acute midline low back pain without sciatica   [facility] Steele Sizer, MD   11 months ago Fever, unspecified fever cause   [place], NP   1 year ago Burning with urination   [place], Satira Anis, MD   1 year ago Pre-op evaluation   [facility]  [place], Satira Anis, MD      Future Appointments            In 2 months Ancil Boozer, Drue Stager, MD [facility], [facility]

## 2020-02-20 ENCOUNTER — Ambulatory Visit: Payer: BC Managed Care – PPO | Admitting: Dermatology

## 2020-02-20 ENCOUNTER — Other Ambulatory Visit: Payer: Self-pay

## 2020-02-20 DIAGNOSIS — L309 Dermatitis, unspecified: Secondary | ICD-10-CM

## 2020-02-20 DIAGNOSIS — Z85828 Personal history of other malignant neoplasm of skin: Secondary | ICD-10-CM | POA: Diagnosis not present

## 2020-02-20 DIAGNOSIS — L853 Xerosis cutis: Secondary | ICD-10-CM

## 2020-02-20 MED ORDER — CLOBETASOL PROPIONATE 0.05 % EX SOLN: 1.0000 "application " | Freq: Two times a day (BID) | CUTANEOUS | 1 refills | Status: DC

## 2020-02-20 MED ORDER — HYDROCORTISONE 2.5 % EX CREA: TOPICAL_CREAM | Freq: Two times a day (BID) | CUTANEOUS | 1 refills | Status: DC

## 2020-02-20 NOTE — Patient Instructions (Addendum)
Recommend [place] Rapid Relief Anti-Itch cream up to 3 times per day to areas that are itchy.  Topical steroids (such as triamcinolone, fluocinolone, fluocinonide, mometasone, clobetasol, halobetasol, betamethasone, hydrocortisone) can cause thinning and lightening of the skin if they are used for too long in the same area. Your physician has selected the right strength medicine for your problem and area affected on the body. Please use your medication only as directed by your physician to prevent side effects.   Dermatitis Skin Care  Pour 1(one) 57ml bottle of clobetasol 0.05% solution into a jar of CeraVe and mix well. Label this jar to indicate the medication has been added. USE TWICE DAILY TO AFFECTED AREAS. However do not apply to face, groin, or underarms.   Moisturizer may burn or sting initially. Try for at least 4 weeks  Gentle Skin Care Guide  1. Bathe no more than once a day.  2. Avoid bathing in hot water  3. Use a mild soap like Dove, Vanicream, Cetaphil, CeraVe. Can use Lever 2000 or Cetaphil antibacterial soap  4. Use soap only where you need it. On most days, use it under your arms, between  your legs, and on your fee. Let the water rinse other areas unless visibly dirty.  5. When you get out of the bath/shower, use a towel to gently blot your skin dry, don't rub it.  6. While your skin is still a little damp, apply a moisturizing cream such as Vanicream,  CeraVe, Cetaphil, Eucerin, Sarna lotion or plain Vaseline Jelly. For hands apply Neutrogena [place] Hand Cream or Excipial Hand Cream.  7. Reapply moisturizer any time you start to itch or feel dry.  8. Sometimes using free and clear laundry detergents can be helpful. Fabric softener sheets should be avoided. Downy Free & Gentle liquid, or any liquid fabric softener that is free of dyes and perfumes, it acceptable to use  9. If your doctor has given you prescription creams you may apply moisturizers over them

## 2020-02-20 NOTE — Progress Notes (Signed)
   Follow-Up Visit   Subjective  Yolanda Pace is a 54 y.o. female who presents for the following: Rash (rash all over for almost 2 week, Very itchy Patient has been scratching a lot, has tried hydroxyzine. Patient thinks she may be allergic to her new leather sofa).  The following portions of the chart were reviewed this encounter and updated as appropriate: Tobacco  Allergies  Meds  Problems  Med Hx  Surg Hx  Fam Hx      Review of Systems: No other skin or systemic complaints.  Objective  Well appearing patient in no apparent distress; mood and affect are within normal limits.  A full examination was performed including scalp, head, eyes, ears, nose, lips, neck, chest, axillae, abdomen, back, buttocks, bilateral upper extremities, bilateral lower extremities, hands, feet, fingers, toes, fingernails, and toenails. All findings within normal limits unless otherwise noted below.  Objective  Right Abdomen (side) - Upper: Scaly erythematous papules and plaques +/- edema and vesiculation.   Objective  Left Upper Back: Well healed scar with no evidence of recurrence.   Assessment & Plan  Dermatitis Right Abdomen (side) - Upper  Recent onset since getting a new leather sofa. Ddx includes allergic contact dermatitis vs hypersensitivity vs eczema.   Start clobetasol mixed with CeraVe BID as need to areas on body with rash Avoid Face,Groin and Axilla  Start HC 2.5% cream BID as need to face with rash  Topical steroids (such as triamcinolone, fluocinolone, fluocinonide, mometasone, clobetasol, halobetasol, betamethasone, hydrocortisone) can cause thinning and lightening of the skin if they are used for too long in the same area. Your physician has selected the right strength medicine for your problem and area affected on the body. Please use your medication only as directed by your physician to prevent side effects.   Recommend [place] Rapid Relief Anti-Itch cream up to 3 times per  day to areas that are itchy.  Pour 1(one) 38ml bottle of clobetasol 0.05% solution into a jar of CeraVe and mix well. Label this jar to indicate the medication has been added. USE TWICE DAILY TO AFFECTED AREAS. However do not apply to face, groin, or underarms.   Moisturizer may burn or sting initially. Try for at least 4 weeks  hydrocortisone 2.5 % cream - Right Abdomen (side) - Upper  clobetasol (TEMOVATE) 0.05 % external solution - Right Abdomen (side) - Upper  History of basal cell carcinoma (BCC) Left Upper Back  Well healed, The patient will observe these symptoms, and report promptly any worsening or unexpected persistence.  If well, may return prn.   Xerosis - diffuse xerotic patches - recommend gentle, hydrating skin care - gentle skin care handout given   Return for for Patch testing next available.   IDonzetta Kohut, CMA, am acting as scribe for Forest Gleason, MD .  Documentation: I have reviewed the above documentation for accuracy and completeness, and I agree with the above.  Forest Gleason, MD

## 2020-02-25 ENCOUNTER — Other Ambulatory Visit: Payer: Self-pay

## 2020-02-25 ENCOUNTER — Ambulatory Visit: Payer: BC Managed Care – PPO

## 2020-02-25 DIAGNOSIS — L239 Allergic contact dermatitis, unspecified cause: Secondary | ICD-10-CM

## 2020-02-25 NOTE — Patient Instructions (Addendum)
Patient advised to keep back dry and do not scratch or peel of panels until she returns into the office on Wednesday for a nurse visit.  Patient to return in 2 days.

## 2020-02-25 NOTE — Progress Notes (Signed)
Applied T.R.U.E test X36 to patients upper back today. Patient advised to keep patches on back until she returns Wednesday to have them removed and read by nurse.  Patient advised not to get patches wet and do not soak in water.

## 2020-02-27 ENCOUNTER — Other Ambulatory Visit: Payer: Self-pay

## 2020-02-27 ENCOUNTER — Ambulatory Visit: Payer: BC Managed Care – PPO

## 2020-02-27 DIAGNOSIS — L239 Allergic contact dermatitis, unspecified cause: Secondary | ICD-10-CM | POA: Diagnosis not present

## 2020-02-27 NOTE — Progress Notes (Signed)
Patient came in today for 2nd patch test reading (day 3). Patient showed no signs of any reactions on TRUE test from numbers 1-36. Patient advised OK to get back wet but do not scrub or soak in water.

## 2020-03-05 ENCOUNTER — Other Ambulatory Visit: Payer: Self-pay

## 2020-03-05 ENCOUNTER — Encounter: Payer: Self-pay | Admitting: Dermatology

## 2020-03-05 ENCOUNTER — Ambulatory Visit: Payer: BC Managed Care – PPO | Admitting: Dermatology

## 2020-03-05 DIAGNOSIS — L237 Allergic contact dermatitis due to plants, except food: Secondary | ICD-10-CM | POA: Diagnosis not present

## 2020-03-05 DIAGNOSIS — L309 Dermatitis, unspecified: Secondary | ICD-10-CM

## 2020-03-05 NOTE — Progress Notes (Signed)
   Follow-Up Visit   Subjective  Yolanda Pace is a 54 y.o. female who presents for the following: Follow-up (patch test follow up).  Patch test follow up for dermatitis. Patient is using clobetasol/CeraVe mix QD-BID as needed.   Hydrocortisone for the face. Itching is better on some days but is continuing to break out on other days. Patient and husband both notice change at  #10 and #20.  The following portions of the chart were reviewed this encounter and updated as appropriate:  Tobacco  Allergies  Meds  Problems  Med Hx  Surg Hx  Fam Hx     Review of Systems:  No other skin or systemic complaints except as noted in HPI or Assessment and Plan.  Objective  Well appearing patient in no apparent distress; mood and affect are within normal limits.  A focused examination was performed including back, arms, chest. Relevant physical exam findings are noted in the Assessment and Plan.  Objective  Trunk, extremities: +1 reaction at # 10 Balsam of [place]  Scaly pink papules at arms   Assessment & Plan    Dermatitis Trunk, extremities  Allergic contact dermatitis to [place]als[place] of [place] (unlikely to be causing primary rash at this time). Patient given handout regarding Roseanne Kaufman of [place] allergic contact dermatitis and how to avoid it. Reviewed chronic nature.  Likely allergic contact dermatitis to dimethyl fumarate or other allergen from new leather couch. Patient reports she does not wear leather shoes.   Discussed option of more complete patch testing at [facility] but patient defers at this time. She will sit in another chair and avoid the couch to see if she can get complete resolution.   Cont clobetasol/CeraVe mix BID PRN rash/itch. Cont hydrocortisone 2.5% to face BID PRN up to 1 week  Topical steroids (such as triamcinolone, fluocinolone, fluocinonide, mometasone, clobetasol, halobetasol, betamethasone, hydrocortisone) can cause thinning and lightening of the skin if they are used for too  long in the same area. Your physician has selected the right strength medicine for your problem and area affected on the body. Please use your medication only as directed by your physician to prevent side effects.    Other Related Medications hydrocortisone 2.5 % cream clobetasol (TEMOVATE) 0.05 % external solution  Return in about 2 months (around 05/05/2020) for TBSE, Dermatitis follow up.  Graciella Belton, RMA, am acting as scribe for Forest Gleason, MD .  Documentation: I have reviewed the above documentation for accuracy and completeness, and I agree with the above.  Forest Gleason, MD

## 2020-03-05 NOTE — Patient Instructions (Addendum)
Recommend daily broad spectrum sunscreen SPF 30+ to sun-exposed areas, reapply every 2 hours as needed. Call for new or changing lesions.  Topical steroids (such as triamcinolone, fluocinolone, fluocinonide, mometasone, clobetasol, halobetasol, betamethasone, hydrocortisone) can cause thinning and lightening of the skin if they are used for too long in the same area. Your physician has selected the right strength medicine for your problem and area affected on the body. Please use your medication only as directed by your physician to prevent side effects.   

## 2020-04-23 DIAGNOSIS — S46012A Strain of muscle(s) and tendon(s) of the rotator cuff of left shoulder, initial encounter: Secondary | ICD-10-CM | POA: Insufficient documentation

## 2020-04-29 ENCOUNTER — Other Ambulatory Visit: Payer: Self-pay | Admitting: Obstetrics and Gynecology

## 2020-04-29 DIAGNOSIS — Z1231 Encounter for screening mammogram for malignant neoplasm of breast: Secondary | ICD-10-CM

## 2020-05-05 ENCOUNTER — Encounter: Payer: Self-pay | Admitting: Family Medicine

## 2020-05-05 ENCOUNTER — Ambulatory Visit: Payer: BC Managed Care – PPO | Admitting: Family Medicine

## 2020-05-05 ENCOUNTER — Other Ambulatory Visit: Payer: Self-pay

## 2020-05-05 VITALS — BP 130/80 | HR 80 | Temp 97.3°F | Resp 16 | Ht 63.0 in | Wt 186.5 lb

## 2020-05-05 DIAGNOSIS — R768 Other specified abnormal immunological findings in serum: Secondary | ICD-10-CM

## 2020-05-05 DIAGNOSIS — J3089 Other allergic rhinitis: Secondary | ICD-10-CM

## 2020-05-05 DIAGNOSIS — R7303 Prediabetes: Secondary | ICD-10-CM | POA: Diagnosis not present

## 2020-05-05 DIAGNOSIS — Z1159 Encounter for screening for other viral diseases: Secondary | ICD-10-CM

## 2020-05-05 DIAGNOSIS — J302 Other seasonal allergic rhinitis: Secondary | ICD-10-CM

## 2020-05-05 DIAGNOSIS — F325 Major depressive disorder, single episode, in full remission: Secondary | ICD-10-CM

## 2020-05-05 DIAGNOSIS — R7989 Other specified abnormal findings of blood chemistry: Secondary | ICD-10-CM

## 2020-05-05 DIAGNOSIS — Z114 Encounter for screening for human immunodeficiency virus [HIV]: Secondary | ICD-10-CM | POA: Diagnosis not present

## 2020-05-05 DIAGNOSIS — E559 Vitamin D deficiency, unspecified: Secondary | ICD-10-CM

## 2020-05-05 DIAGNOSIS — I10 Essential (primary) hypertension: Secondary | ICD-10-CM

## 2020-05-05 DIAGNOSIS — R7982 Elevated C-reactive protein (CRP): Secondary | ICD-10-CM

## 2020-05-05 DIAGNOSIS — M797 Fibromyalgia: Secondary | ICD-10-CM

## 2020-05-05 DIAGNOSIS — J4520 Mild intermittent asthma, uncomplicated: Secondary | ICD-10-CM

## 2020-05-05 MED ORDER — MONTELUKAST SODIUM 10 MG PO TABS: 10.0000 mg | ORAL_TABLET | Freq: Every day | ORAL | 1 refills | Status: DC

## 2020-05-05 MED ORDER — AMLODIPINE BESYLATE 5 MG PO TABS: 5.0000 mg | ORAL_TABLET | Freq: Every day | ORAL | 1 refills | Status: DC

## 2020-05-05 MED ORDER — DULOXETINE HCL 60 MG PO CPEP: 60.0000 mg | ORAL_CAPSULE | Freq: Every day | ORAL | 1 refills | Status: DC

## 2020-05-05 NOTE — Progress Notes (Signed)
Name: Yolanda Pace   MRN: [id number]    DOB: [birth year]   Date:05/05/2020       Progress Note  Subjective  Chief Complaint  Chief Complaint  Patient presents with  . Hypertension  . Anxiety  . Gastroesophageal Reflux  . Depression  . Hyperlipidemia  . Fibromyalgia    She is having a flare today.    HPI  FMS/elevated CRP: she was seen by Dr. Jefferson Fuel in the past and we mistakenly sent her to see Dr Kirby Funk of 2020. She states x-rays were done and also labs , inflammatory markers improved and she was given reassurance that is it OA Takes Duloxetine seems to help with pain. He states no recent episodes of severe fatigue. She takes skelaxin prn. Discussed lyrica but she wants to hold off for now. She states she feels like she is having a flare.   Left shoulder pain: seeing Isabel Caprice, she had a work related injury and is going for a revision of left shoulder surgery January 5 th 2021 , first surgery was 12/2018. She has daily pain. She finished PT. She states second surgery was mostly for frozen shoulder , the pain has been worse since second surgery, pain level 7/10 described as aching and burning posteriorly over her left shoulder blade. She has decrease rom of left shoulder. She has not been back to work since she has limitations.   Pre-diabetes: glucose in urine, A1C was  6.4% , last time it was down to 6.1 % . She states she has not been drinking coffee, down to at most one can of soda a day, avoid sweets , she has been walking her dogs most days of the week. She has polydipsia, but no polyphagia.   Depression in remission: doing well on Duloxetine, she states she feels better than she has in years. She was diagnosed at age 56  - sudden onset after hysterectomy/bladder sling. She states took years to get back to normal. She was on zoloft for many years, switched to duloxetine because of FMS and joint problems and is doing well on medication . She states feels down  sometimes because of the shoulder pain and the unknowns but does not stay down. Continue medications   GERD: controlled with medication, denies heartburn or indigestion She is compliant with medication   Chronic back pain with radiculitis: under the care of Dr. Phyllis Ginger , she states symptoms started suddenly Feb 2021, she had MRI . She had 3 steroid injections since, pain is on her tail bone that radiates to left groin. Doing better, able to walk, no bowel incontinence, she states that after the last procedure she had urinary incontinence for 24 hours but back to baseline.   Patient Active Problem List   Diagnosis Date Noted  . Traumatic incomplete tear of left rotator cuff 04/23/2020  . Arthralgia 08/03/2019  . Gastric polyp   . Benign neoplasm of transverse colon   . Internal hemorrhoids   . Malignant neoplasm of skin 06/19/2018  . Controlled substance agreement signed 05/12/2018  . 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 Kidney stone 02/20/2018  . Renal lesion 02/20/2018  . Benign hypertension 01/21/2018  . Pelvic pain 12/26/2017  . Major depression in remission ([place]) 12/26/2017  . GERD (gastroesophageal reflux disease) 11/09/2017  . 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 Elevated C-reactive protein (CRP) 07/11/2017  . Right thyroid nodule 05/26/2017  . Mild hypercholesterolemia 05/06/2017  . Generalized osteoarthritis of  hand 12/13/2016  . Impingement syndrome of shoulder region, left 11/29/2016  . Asthma   . Anxiety   . Allergic rhinitis   . Insomnia   . Obesity   . Vitamin D deficiency disease     Past Surgical History:  Procedure Laterality Date  . ABDOMINAL HYSTERECTOMY  2005ish   fibroids, ovaries remain; along with bladder tack  . bladder sling mesh removal  08/2018  . bladder tack  2005ish  . CHOLECYSTECTOMY    . COLONOSCOPY WITH PROPOFOL N/A 06/22/2018   Procedure: COLONOSCOPY WITH PROPOFOL;  Surgeon: Virgel Manifold, MD;   Location: [facility] ENDOSCOPY;  Service: Endoscopy;  Laterality: N/A;  . CYSTO WITH HYDRODISTENSION N/A 05/02/2018   Procedure: CYSTOSCOPY/HYDRODISTENSION;  Surgeon: Royston Cowper, MD;  Location: [facility] ORS;  Service: Urology;  Laterality: N/A;  . ESOPHAGOGASTRODUODENOSCOPY (EGD) WITH PROPOFOL N/A 06/22/2018   Procedure: ESOPHAGOGASTRODUODENOSCOPY (EGD) WITH PROPOFOL;  Surgeon: Virgel Manifold, MD;  Location: [facility] ENDOSCOPY;  Service: Endoscopy;  Laterality: N/A;  . INCONTINENCE SURGERY    . ROTATOR CUFF REPAIR Right 01/08/2019  . rotator cuff revision Left 12/06/2019  . SKIN CANCER EXCISION  Aug 2015   removed from back    Family History  Problem Relation Age of Onset  . Hyperlipidemia Mother   . Diabetes Sister   . Hyperlipidemia Sister   . Hypertension Sister   . Hypertension Brother   . Pneumonia Maternal Grandmother   . Heart attack Maternal Grandfather   . Diabetes Sister   . Hypertension Sister   . Epilepsy Sister   . Hypertension Sister   . Hypertension Daughter   . Kidney Stones Daughter   . Cancer Neg Hx   . COPD Neg Hx   . Stroke Neg Hx     Social History   Tobacco Use  . Smoking status: Never Smoker  . Smokeless tobacco: Never Used  Substance Use Topics  . Alcohol use: No     Current Outpatient Medications:  .  albuterol (PROAIR HFA) 108 (90 Base) MCG/ACT inhaler, Inhale 2 puffs into the lungs every 4 (four) hours as needed for wheezing or shortness of breath., Disp: 1 Inhaler, Rfl: 2 .  albuterol (PROVENTIL) (2.5 MG/3ML) 0.083% nebulizer solution, Take 3 mLs (2.5 mg total) by nebulization every 4 (four) hours as needed for wheezing or shortness of breath., Disp: 75 mL, Rfl: 1 .  amLODipine (NORVASC) 5 MG tablet, Take 1 tablet (5 mg total) by mouth daily., Disp: 90 tablet, Rfl: 1 .  Cholecalciferol (VITAMIN D) 2000 units tablet, Take 2,000 Units by mouth daily. , Disp: , Rfl:  .  cyclobenzaprine (FLEXERIL) 10 MG tablet, SMARTSIG:1 Tablet(s) By Mouth Daily PRN,  Disp: , Rfl:  .  DULoxetine (CYMBALTA) 60 MG capsule, Take 1 capsule (60 mg total) by mouth daily., Disp: 90 capsule, Rfl: 1 .  fexofenadine (ALLEGRA ALLERGY) 180 MG tablet, Take 1 tablet (180 mg total) by mouth daily., Disp: 90 tablet, Rfl: 1 .  ibuprofen (ADVIL,MOTRIN) 600 MG tablet, Take 600 mg by mouth daily with breakfast., Disp: , Rfl:  .  levocetirizine (XYZAL) 5 MG tablet, TAKE 1 TABLET BY MOUTH ONCE DAILY IN THE EVENING, Disp: 90 tablet, Rfl: 2 .  montelukast (SINGULAIR) 10 MG tablet, Take 1 tablet (10 mg total) by mouth at bedtime., Disp: 90 tablet, Rfl: 1 .  omeprazole (PRILOSEC) 20 MG capsule, Take 20 mg by mouth every morning. , Disp: , Rfl:   Allergies  Allergen Reactions  . Other  [place]: vomiting   . Meloxicam Itching  . Compazine  [Prochlorperazine] Other (See Comments)    Jaws locked up  . Contrast Media [Iodinated Diagnostic Agents] Swelling    Noted in [facility] on 01/06[facility]2012. Premedicate patient prior to contrast media injections.  . Doxycycline Nausea Only    GI upset  . Gabapentin Nausea And Vomiting    Lip numbness   . Sulfur Other (See Comments)    Made all blood vessels in eyes burst  . Codeine Nausea And Vomiting  . Iodine Swelling    I personally reviewed active problem list, medication list, allergies, family history, social history, health maintenance with the patient/caregiver today.   ROS  Constitutional: Negative for fever or weight change.  Respiratory: Negative for cough and shortness of breath.   Cardiovascular: Negative for chest pain or palpitations.  Gastrointestinal: Negative for abdominal pain, no bowel changes.  Musculoskeletal: Negative for gait problem or joint swelling.  Skin: Negative for rash.  Neurological: Negative for dizziness or headache.  No other specific complaints in a complete review of systems (except as listed in HPI above).   Objective  Vitals:   05/05/20 0811  BP: 130/80  Pulse: 80  Resp: 16   Temp: (!) 97.3 F (36.3 C)  TempSrc: Temporal  SpO2: 95%  Weight: 186 lb 8 oz (84.6 kg)  Height: 5\' 3"  (1.6 m)    Body mass index is 33.04 kg/m.  Physical Exam  Constitutional: Patient appears well-developed and well-nourished. Obese  No distress.  HEENT: head atraumatic, normocephalic, pupils equal and reactive to light,  neck supple Cardiovascular: Normal rate, regular rhythm and normal heart sounds.  No murmur heard. No BLE edema. Pulmonary/Chest: Effort normal and breath sounds normal. No respiratory distress. Muscular Skeletal: pain during palpation of left shoulder, also has decrease rom in all direction, very limited abduction, internal rotation and abduction . Trigger point positive  Abdominal: Soft.  There is no tenderness. Psychiatric: Patient has a normal mood and affect. behavior is normal. Judgment and thought content normal.  PHQ2/9: Depression screen [facility] 2/9 05/05/2020 11/05/2019 08/03/2019 03/13/2019 01/19/2019  Decreased Interest 0 0 0 0 0  Down, Depressed, Hopeless 0 0 0 0 0  PHQ - 2 Score 0 0 0 0 0  Altered sleeping 2 0 0 0 0  Tired, decreased energy 2 0 0 0 0  Change in appetite 0 0 0 0 0  Feeling bad or failure about yourself  0 0 0 0 0  Trouble concentrating 0 0 0 0 0  Moving slowly or fidgety/restless 0 0 0 0 0  Suicidal thoughts 0 0 0 0 0  PHQ-9 Score 4 0 0 0 0  Difficult doing work/chores Somewhat difficult - - Not difficult at all Not difficult at all  Some recent data might be hidden    phq 9 is negative   Fall Risk: Fall Risk  05/05/2020 11/05/2019 03/13/2019 01/19/2019 08/14/2018  Falls in the past year? 0 0 1 0 Yes  Number falls in past yr: 0 0 0 0 1  Injury with Fall? 0 0 0 0 Yes    Functional Status Survey: Is the patient deaf or have difficulty hearing?: No Does the patient have difficulty seeing, even when wearing glasses/contacts?: No Does the patient have difficulty concentrating, remembering, or making decisions?: No Does the patient  have difficulty walking or climbing stairs?: No Does the patient have difficulty dressing or bathing?: No Does the patient have difficulty doing errands alone  such as visiting a doctor's office or shopping?: No    Assessment & Plan  1. Depression, major, in remission ([place])  - DULoxetine (CYMBALTA) 60 MG capsule; Take 1 capsule (60 mg total) by mouth daily.  Dispense: 90 capsule; Refill: 1  2. Benign hypertension  - CBC with Differential/Platelet - COMPLETE METABOLIC PANEL WITH GFR - amLODipine (NORVASC) 5 MG tablet; Take 1 tablet (5 mg total) by mouth daily.  Dispense: 90 tablet; Refill: 1  3. Pre-diabetes  - Hemoglobin A1c  4. Encounter for screening for HIV  - HIV Antibody (routine testing w rflx)  5. Cyclic citrullinated peptide (CCP) antibody positive  - Sedimentation rate - C-reactive protein - Cyclic citrul peptide antibody, IgG  6. Elevated C-reactive protein (CRP)  - Sedimentation rate - C-reactive protein  7. Need for hepatitis C screening test  - Hepatitis C antibody  8. Asthma, mild intermittent, well-controlled  - montelukast (SINGULAIR) 10 MG tablet; Take 1 tablet (10 mg total) by mouth at bedtime.  Dispense: 90 tablet; Refill: 1  9. Vitamin D deficiency disease  - VITAMIN D 25 Hydroxy (Vit-D Deficiency, Fractures)  10. Fibromyalgia   11. Perennial allergic rhinitis with seasonal variation  - montelukast (SINGULAIR) 10 MG tablet; Take 1 tablet (10 mg total) by mouth at bedtime.  Dispense: 90 tablet; Refill: 1

## 2020-05-06 LAB — COMPLETE METABOLIC PANEL WITH GFR
AG Ratio: 1.9 (calc) (ref 1.0–2.5)
ALT: 45 U/L — ABNORMAL HIGH (ref 6–29)
AST: 29 U/L (ref 10–35)
Albumin: 4.4 g/dL (ref 3.6–5.1)
Alkaline phosphatase (APISO): 118 U/L (ref 37–153)
BUN: 11 mg/dL (ref 7–25)
CO2: 26 mmol/L (ref 20–32)
Calcium: 9.9 mg/dL (ref 8.6–10.4)
Chloride: 105 mmol/L (ref 98–110)
Creat: 0.57 mg/dL (ref 0.50–1.05)
GFR, Est African American: 122 mL/min/{1.73_m2} (ref >=60)
GFR, Est Non African American: 105 mL/min/{1.73_m2} (ref >=60)
Globulin: 2.3 g/dL (calc) (ref 1.9–3.7)
Glucose, Bld: 149 mg/dL — ABNORMAL HIGH (ref 65–99)
Potassium: 3.9 mmol/L (ref 3.5–5.3)
Sodium: 143 mmol/L (ref 135–146)
Total Bilirubin: 0.5 mg/dL (ref 0.2–1.2)
Total Protein: 6.7 g/dL (ref 6.1–8.1)

## 2020-05-06 LAB — CBC WITH DIFFERENTIAL/PLATELET
Absolute Monocytes: 518 cells/uL (ref 200–950)
Basophils Absolute: 78 cells/uL (ref 0–200)
Basophils Relative: 1.1 %
Eosinophils Absolute: 199 cells/uL (ref 15–500)
Eosinophils Relative: 2.8 %
HCT: 43.7 % (ref 35.0–45.0)
Hemoglobin: 14.4 g/dL (ref 11.7–15.5)
Lymphs Abs: 2386 cells/uL (ref 850–3900)
MCH: 30.1 pg (ref 27.0–33.0)
MCHC: 33 g/dL (ref 32.0–36.0)
MCV: 91.4 fL (ref 80.0–100.0)
MPV: 9.5 fL (ref 7.5–12.5)
Monocytes Relative: 7.3 %
Neutro Abs: 3919 cells/uL (ref 1500–7800)
Neutrophils Relative %: 55.2 %
Platelets: 304 10*3/uL (ref 140–400)
RBC: 4.78 10*6/uL (ref 3.80–5.10)
RDW: 11.7 % (ref 11.0–15.0)
Total Lymphocyte: 33.6 %
WBC: 7.1 10*3/uL (ref 3.8–10.8)

## 2020-05-06 LAB — HEMOGLOBIN A1C
Hgb A1c MFr Bld: 7.5 % of total Hgb — ABNORMAL HIGH (ref <5.7)
Mean Plasma Glucose: 169 (calc)
eAG (mmol/L): 9.3 (calc)

## 2020-05-06 LAB — CYCLIC CITRUL PEPTIDE ANTIBODY, IGG: Cyclic Citrullin Peptide Ab: <16 UNITS

## 2020-05-06 LAB — HEPATITIS C ANTIBODY
Hepatitis C Ab: NONREACTIVE
SIGNAL TO CUT-OFF: 0.01 (ref <1.00)

## 2020-05-06 LAB — VITAMIN D 25 HYDROXY (VIT D DEFICIENCY, FRACTURES): Vit D, 25-Hydroxy: 32 ng/mL (ref 30–100)

## 2020-05-06 LAB — SEDIMENTATION RATE: Sed Rate: 2 mm/h (ref 0–30)

## 2020-05-06 LAB — C-REACTIVE PROTEIN: CRP: 8.7 mg/L — ABNORMAL HIGH (ref <8.0)

## 2020-05-06 LAB — HIV ANTIBODY (ROUTINE TESTING W REFLEX): HIV 1&2 Ab, 4th Generation: NONREACTIVE

## 2020-05-08 ENCOUNTER — Ambulatory Visit
Admission: RE | Admit: 2020-05-08 | Discharge: 2020-05-08 | Disposition: A | Discharge status: Home / Self Care | Payer: BC Managed Care – PPO | Source: Ambulatory Visit | Attending: Obstetrics and Gynecology | Admitting: Obstetrics and Gynecology

## 2020-05-08 DIAGNOSIS — Z1231 Encounter for screening mammogram for malignant neoplasm of breast: Secondary | ICD-10-CM | POA: Diagnosis not present

## 2020-05-27 ENCOUNTER — Other Ambulatory Visit: Payer: Self-pay

## 2020-05-27 ENCOUNTER — Ambulatory Visit: Payer: BC Managed Care – PPO | Admitting: Dermatology

## 2020-05-27 DIAGNOSIS — D229 Melanocytic nevi, unspecified: Secondary | ICD-10-CM

## 2020-05-27 DIAGNOSIS — D0461 Carcinoma in situ of skin of right upper limb, including shoulder: Secondary | ICD-10-CM | POA: Diagnosis not present

## 2020-05-27 DIAGNOSIS — D2239 Melanocytic nevi of other parts of face: Secondary | ICD-10-CM

## 2020-05-27 DIAGNOSIS — D485 Neoplasm of uncertain behavior of skin: Secondary | ICD-10-CM

## 2020-05-27 DIAGNOSIS — L820 Inflamed seborrheic keratosis: Secondary | ICD-10-CM | POA: Diagnosis not present

## 2020-05-27 DIAGNOSIS — D179 Benign lipomatous neoplasm, unspecified: Secondary | ICD-10-CM | POA: Diagnosis not present

## 2020-05-27 DIAGNOSIS — D489 Neoplasm of uncertain behavior, unspecified: Secondary | ICD-10-CM

## 2020-05-27 DIAGNOSIS — L738 Other specified follicular disorders: Secondary | ICD-10-CM | POA: Diagnosis not present

## 2020-05-27 DIAGNOSIS — Z85828 Personal history of other malignant neoplasm of skin: Secondary | ICD-10-CM | POA: Diagnosis not present

## 2020-05-27 DIAGNOSIS — L814 Other melanin hyperpigmentation: Secondary | ICD-10-CM

## 2020-05-27 DIAGNOSIS — L821 Other seborrheic keratosis: Secondary | ICD-10-CM

## 2020-05-27 DIAGNOSIS — L578 Other skin changes due to chronic exposure to nonionizing radiation: Secondary | ICD-10-CM

## 2020-05-27 DIAGNOSIS — Z1283 Encounter for screening for malignant neoplasm of skin: Secondary | ICD-10-CM

## 2020-05-27 DIAGNOSIS — D1800 Hemangioma unspecified site: Secondary | ICD-10-CM

## 2020-05-27 NOTE — Patient Instructions (Addendum)
Recommend daily broad spectrum sunscreen SPF 30+ to sun-exposed areas, reapply every 2 hours as needed. Call for new or changing lesions.  Cryotherapy Aftercare  . Wash gently with soap and water everyday.   Marland Kitchen Apply Vaseline and Band-Aid daily until healed.  Wound Care Instructions  1. Cleanse wound gently with soap and water once a day then pat dry with clean gauze. Apply a thing coat of Petrolatum (petroleum jelly, "Vaseline") over the wound (unless you have an allergy to this). We recommend that you use a new, sterile tube of Vaseline. Do not pick or remove scabs. Do not remove the yellow or white "healing tissue" from the base of the wound.  2. Cover the wound with fresh, clean, nonstick gauze and secure with paper tape. You may use Band-Aids in place of gauze and tape if the would is small enough, but would recommend trimming much of the tape off as there is often too much. Sometimes Band-Aids can irritate the skin.  3. You should call the office for your biopsy report after 1 week if you have not already been contacted.  4. If you experience any problems, such as abnormal amounts of bleeding, swelling, significant bruising, significant pain, or evidence of infection, please call the office immediately.  5. FOR ADULT SURGERY PATIENTS: If you need something for pain relief you may take 1 extra strength Tylenol (acetaminophen) AND 2 Ibuprofen (200mg  each) together every 4 hours as needed for pain. (do not take these if you are allergic to them or if you have a reason you should not take them.) Typically, you may only need pain medication for 1 to 3 days.    Pre-Operative Instructions  You are scheduled for a surgical procedure at [facility]. We recommend you read the following instructions. If you have any questions or concerns, please call the office at [phone].  1. Shower and wash the entire body with soap and water the day of your surgery paying special attention to  cleansing at and around the planned surgery site.  2. Avoid aspirin or aspirin containing products at least fourteen (14) days prior to your surgical procedure and for at least one week (7 Days) after your surgical procedure. If you take aspirin on a regular basis for heart disease or history of stroke or for any other reason, we may recommend you continue taking aspirin but please notify us if you take this on a regular basis. Aspirin can cause more bleeding to occur during surgery as well as prolonged bleeding and bruising after surgery.   3. Avoid other nonsteroidal pain medications at least one week prior to surgery and at least one week prior to your surgery. These include medications such as Ibuprofen (Motrin, Advil and Nuprin), Naprosyn, Voltaren, Relafen, etc. If medications are used for therapeutic reasons, please inform us as they can cause increased bleeding or prolonged bleeding during and bruising after surgical procedures.   4. Please advice [place] if you are taking any "blood thinner" medications such as Coumadin or Dipyridamole or Plavix or similar medications. These cause increased bleeding and prolonged bleeding during and bruising after surgical procedures. We may have to consider discontinuing these medications briefly prior to and shortly after your surgery, if safe to do so.   5. Please inform us of all medications you are currently taking. All medications that are taken regularly should be taken the day of surgery as you always do. Nevertheless, we need to be informed of what medications you are  taking prior to surgery to whether they will affect the procedure or cause any complications.   6. Please inform us of any medication allergies. Also inform us of whether you have allergies to Latex or rubber products or whether you have had any adverse reaction to Lidocaine or Epinephrine.  7. Please inform us of any prosthetic or artificial body parts such as artificial heart valve, joint  replacements, etc., or similar condition that might require preoperative antibiotics.   8. We recommend avoidance of alcohol at least two weeks prior to surgery and continued avoidence for at least two weeks after surgery.   9. We recommend discontinuation of tobacco smoking at least two weeks prior to surgery and continued abstinence for at least two weeks after surgery.  10. Do not plan strenuous exercise, strenuous work or strenuous lifting for approximately four weeks after your surgery.   11. We request if you are unable to make your scheduled surgical appointment, please call us at least a week in advance or as soon as you are aware of a problem sot aht we can cancel or reschedule you.   12. You MAKE TAKE TYLENOL (acetaminophen) for pain as it is not a blood thinner.   13. PLEASE PLAN TO BE IN TOWN FOR TWO WEEKS FOLLOWING SURGERY, THIS IS IMPORTANT SO YOU CAN BE CHECKED FOR DRESSING CHANGES, SUTURE REMOVAL AND TO MONITOR FOR POSSIBLE COMPLICATIONS.

## 2020-05-27 NOTE — Progress Notes (Signed)
Follow-Up Visit   Subjective  Yolanda Pace is a 54 y.o. female who presents for the following: Annual Exam.  Patient here today for TBSE. She has a history of BCC. Patient not aware of any new or changing spots. She has several spots on her trunk and head that have been itchy and irritated.  She also has a fatty growth present for years that has been painful with sitting.  The following portions of the chart were reviewed this encounter and updated as appropriate:      Review of Systems:  No other skin or systemic complaints except as noted in HPI or Assessment and Plan.  Objective  Well appearing patient in no apparent distress; mood and affect are within normal limits.  A full examination was performed including scalp, head, eyes, ears, nose, lips, neck, chest, axillae, abdomen, back, buttocks, bilateral upper extremities, bilateral lower extremities, hands, feet, fingers, toes, fingernails, and toenails. All findings within normal limits unless otherwise noted below.  Objective  Mid Back x 1, Upper Back x 1, Right Flank x 2, Forehead x 5, right crown x 1 (10): Erythematous keratotic or waxy stuck-on papule   Objective  Right Forearm: 51mm brown macule with irregular pigmentation, slightly waxy       Objective  Left Inferior Buttock: 1.5cm rubbery nodule  Objective  Left Root of Nose: 17mm flesh papule     Objective  Right perioral: Yellow papules   Assessment & Plan  Inflamed seborrheic keratosis (10) Mid Back x 1, Upper Back x 1, Right Flank x 2, Forehead x 5, right crown x 1  Destruction of lesion - Mid Back x 1, Upper Back x 1, Right Flank x 2, Forehead x 5, right crown x 1  Destruction method: cryotherapy   Informed consent: discussed and consent obtained   Lesion destroyed using liquid nitrogen: Yes   Region frozen until ice ball extended beyond lesion: Yes   Outcome: patient tolerated procedure well with no complications   Post-procedure details:  wound care instructions given    Neoplasm of uncertain behavior of skin Right Forearm  Epidermal / dermal shaving  Lesion diameter (cm):  0.7 Informed consent: discussed and consent obtained   Patient was prepped and draped in usual sterile fashion: Area prepped with alcohol. Anesthesia: the lesion was anesthetized in a standard fashion   Anesthetic:  1% lidocaine w/ epinephrine 1-100,000 buffered w/ 8.4% NaHCO3 Instrument used: flexible razor blade   Hemostasis achieved with: pressure, aluminum chloride and electrodesiccation   Outcome: patient tolerated procedure well   Post-procedure details: wound care instructions given   Post-procedure details comment:  Ointment and small bandage applied.   Specimen 1 - Surgical pathology Differential Diagnosis: Lentigo vs SK r/o Atypia Check Margins: No 48mm brown macule with irregular pigmentation, slightly waxy  Post tx defect 1.0cm  Lipoma, unspecified site Left Inferior Buttock  Lipoma with symptoms and/or recent change.  Discussed surgical excision to remove, including resulting scar and possible recurrence.  Patient will schedule for surgery. Pre-op information given.       Neoplasm of uncertain behavior Left Root of Nose  Epidermal / dermal shaving  Lesion diameter (cm):  0.3 Informed consent: discussed and consent obtained   Patient was prepped and draped in usual sterile fashion: Area prepped with alcohol. Anesthesia: the lesion was anesthetized in a standard fashion   Anesthetic:  1% lidocaine w/ epinephrine 1-100,000 buffered w/ 8.4% NaHCO3 Instrument used: flexible razor blade   Hemostasis achieved with:  pressure, aluminum chloride and electrodesiccation   Outcome: patient tolerated procedure well   Post-procedure details: wound care instructions given   Post-procedure details comment:  Ointment and small bandage applied.   Specimen 2 - Surgical pathology Differential Diagnosis: Irritated Nevus vs Other Check  Margins: No 43mm flesh papule  Post tx defect 0.5cm  Sebaceous hyperplasia Right perioral  Benign, observe.     Lentigines - Scattered tan macules - Discussed due to sun exposure - Benign, observe - Call for any changes  Seborrheic Keratoses - Stuck-on, waxy, tan-brown papules and plaques  - Discussed benign etiology and prognosis. - Observe - Call for any changes  Melanocytic Nevi - Tan-brown and/or pink-flesh-colored symmetric macules and papules - Benign appearing on exam today - Observation - Call clinic for new or changing moles - Recommend daily use of broad spectrum spf 30+ sunscreen to sun-exposed areas.   Hemangiomas - Red papules - Discussed benign nature - Observe - Call for any changes  Actinic Damage - diffuse scaly erythematous macules with underlying dyspigmentation - Recommend daily broad spectrum sunscreen SPF 30+ to sun-exposed areas, reapply every 2 hours as needed.  - Call for new or changing lesions.  Skin cancer screening performed today.  History of Basal Cell Carcinoma of the Skin - No evidence of recurrence today left spinal mid back - Recommend regular full body skin exams - Recommend daily broad spectrum sunscreen SPF 30+ to sun-exposed areas, reapply every 2 hours as needed.  - Call if any new or changing lesions are noted between office visits  Return in about 1 year (around 05/27/2021) for TBSE, schedule for lipoma surgery.  Graciella Belton, RMA, am acting as scribe for Brendolyn Patty, MD . Documentation: I have reviewed the above documentation for accuracy and completeness, and I agree with the above.  Brendolyn Patty MD

## 2020-05-29 ENCOUNTER — Telehealth: Payer: Self-pay

## 2020-05-29 NOTE — Telephone Encounter (Signed)
-----   Message from Brendolyn Patty, MD sent at 05/28/2020  7:22 PM EDT ----- 1. 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 Skin , left root of nose MELANOCYTIC NEVUS, INTRADERMAL TYPE, IRRITATED  1. SCCIS- needs EDC 2. Benign irritated nevus

## 2020-05-29 NOTE — Telephone Encounter (Signed)
Advised pt of bx results.  Scheduled pt for [facility] 06/25/20 at 11:45./sh

## 2020-06-25 ENCOUNTER — Ambulatory Visit: Payer: BC Managed Care – PPO | Admitting: Dermatology

## 2020-07-02 ENCOUNTER — Encounter: Payer: Self-pay | Admitting: Emergency Medicine

## 2020-07-02 ENCOUNTER — Emergency Department
Admission: EM | Admit: 2020-07-02 | Discharge: 2020-07-02 | Disposition: A | Discharge status: Home / Self Care | Payer: BC Managed Care – PPO | Attending: Emergency Medicine | Admitting: Emergency Medicine

## 2020-07-02 ENCOUNTER — Other Ambulatory Visit: Payer: Self-pay

## 2020-07-02 ENCOUNTER — Emergency Department: Payer: BC Managed Care – PPO

## 2020-07-02 DIAGNOSIS — J45909 Unspecified asthma, uncomplicated: Secondary | ICD-10-CM | POA: Insufficient documentation

## 2020-07-02 DIAGNOSIS — I10 Essential (primary) hypertension: Secondary | ICD-10-CM | POA: Diagnosis not present

## 2020-07-02 DIAGNOSIS — U071 COVID-19: Secondary | ICD-10-CM | POA: Insufficient documentation

## 2020-07-02 DIAGNOSIS — E860 Dehydration: Secondary | ICD-10-CM | POA: Insufficient documentation

## 2020-07-02 DIAGNOSIS — M791 Myalgia, unspecified site: Secondary | ICD-10-CM | POA: Diagnosis not present

## 2020-07-02 DIAGNOSIS — E876 Hypokalemia: Secondary | ICD-10-CM | POA: Insufficient documentation

## 2020-07-02 DIAGNOSIS — Z85828 Personal history of other malignant neoplasm of skin: Secondary | ICD-10-CM | POA: Insufficient documentation

## 2020-07-02 DIAGNOSIS — Z79899 Other long term (current) drug therapy: Secondary | ICD-10-CM | POA: Insufficient documentation

## 2020-07-02 DIAGNOSIS — R0602 Shortness of breath: Secondary | ICD-10-CM | POA: Diagnosis present

## 2020-07-02 LAB — COMPREHENSIVE METABOLIC PANEL
ALT: 47 U/L — ABNORMAL HIGH (ref 0–44)
AST: 34 U/L (ref 15–41)
Albumin: 4.1 g/dL (ref 3.5–5.0)
Alkaline Phosphatase: 88 U/L (ref 38–126)
Anion gap: 13 (ref 5–15)
BUN: 14 mg/dL (ref 6–20)
CO2: 22 mmol/L (ref 22–32)
Calcium: 9.3 mg/dL (ref 8.9–10.3)
Chloride: 101 mmol/L (ref 98–111)
Creatinine, Ser: 0.52 mg/dL (ref 0.44–1.00)
GFR calc Af Amer: >60 mL/min (ref >60)
GFR calc non Af Amer: >60 mL/min (ref >60)
Glucose, Bld: 157 mg/dL — ABNORMAL HIGH (ref 70–99)
Potassium: 3 mmol/L — ABNORMAL LOW (ref 3.5–5.1)
Sodium: 136 mmol/L (ref 135–145)
Total Bilirubin: 0.8 mg/dL (ref 0.3–1.2)
Total Protein: 7.7 g/dL (ref 6.5–8.1)

## 2020-07-02 LAB — CBC
HCT: 43 % (ref 36.0–46.0)
Hemoglobin: 15 g/dL (ref 12.0–15.0)
MCH: 29.5 pg (ref 26.0–34.0)
MCHC: 34.9 g/dL (ref 30.0–36.0)
MCV: 84.6 fL (ref 80.0–100.0)
Platelets: 192 10*3/uL (ref 150–400)
RBC: 5.08 MIL/uL (ref 3.87–5.11)
RDW: 12.6 % (ref 11.5–15.5)
WBC: 6.3 10*3/uL (ref 4.0–10.5)
nRBC: 0 % (ref 0.0–0.2)

## 2020-07-02 LAB — TROPONIN I (HIGH SENSITIVITY): Troponin I (High Sensitivity): 3 ng/L (ref <18)

## 2020-07-02 MED ORDER — ONDANSETRON HCL 4 MG/2ML IJ SOLN
4.0000 mg | Freq: Once | INTRAMUSCULAR | Status: AC
  Administered 2020-07-02: 4 mg via INTRAVENOUS
  Filled 2020-07-02: qty 2

## 2020-07-02 MED ORDER — POTASSIUM CHLORIDE 10 MEQ/100ML IV SOLN
10.0000 meq | INTRAVENOUS | Status: DC
  Administered 2020-07-02: 10 meq via INTRAVENOUS
  Filled 2020-07-02: qty 100

## 2020-07-02 MED ORDER — ACETAMINOPHEN 325 MG PO TABS
ORAL_TABLET | ORAL | Status: AC
  Filled 2020-07-02: qty 2

## 2020-07-02 MED ORDER — LACTATED RINGERS IV BOLUS
1000.0000 mL | Freq: Once | INTRAVENOUS | Status: AC
  Administered 2020-07-02: 1000 mL via INTRAVENOUS

## 2020-07-02 MED ORDER — ONDANSETRON 4 MG PO TBDP: 4.0000 mg | ORAL_TABLET | Freq: Three times a day (TID) | ORAL | 0 refills | Status: DC | PRN

## 2020-07-02 MED ORDER — POTASSIUM CHLORIDE 10 MEQ/100ML IV SOLN: 10.0000 meq | INTRAVENOUS | Status: DC

## 2020-07-02 MED ORDER — ACETAMINOPHEN 325 MG PO TABS
650.0000 mg | ORAL_TABLET | Freq: Once | ORAL | Status: AC
  Administered 2020-07-02: 650 mg via ORAL
  Filled 2020-07-02: qty 2

## 2020-07-02 MED ORDER — POTASSIUM CHLORIDE CRYS ER 20 MEQ PO TBCR
40.0000 meq | EXTENDED_RELEASE_TABLET | Freq: Once | ORAL | Status: AC
  Administered 2020-07-02: 40 meq via ORAL
  Filled 2020-07-02: qty 2

## 2020-07-02 MED ORDER — KETOROLAC TROMETHAMINE 30 MG/ML IJ SOLN
15.0000 mg | Freq: Once | INTRAMUSCULAR | Status: AC
  Administered 2020-07-02: 15 mg via INTRAVENOUS
  Filled 2020-07-02: qty 1

## 2020-07-02 NOTE — ED Provider Notes (Signed)
[facility] Emergency Department Provider Note  ____________________________________________   First MD Initiated Contact with Patient 07/02/20 1610     (approximate)  I have reviewed the triage vital signs and the nursing notes.   HISTORY  Chief Complaint Shortness of Breath   HPI Yolanda Pace is a 54 y.o. female with a past medical history of asthma, arthritis, anxiety, depression, fibromyalgia, GERD, HTN, and seizures who presents for assessment of approximately 1 week of fevers, cough, shortness of breath, nausea, vomiting, and diarrhea with very poor p.o. intake and severe fatigue.  Patient denies any focal earache, eye pain, acute chest pain, blood in her vomit or stool, dysuria, abdominal pain, back pain, rash, focal extremity pain, or other acute complaints.  Denies any recent traumatic injuries.  Denies tobacco abuse, EtOH use, illicit drug use.  States has been taking Tylenol and Zofran but this has not significantly helped.  States she is coming to the ED because she just is not feeling better.      Patient does note she was tested for COVID-19 [facility] the [facility]/16 and was positive.   Past Medical History:  Diagnosis Date  . Allergic rhinitis   . Anxiety   . Arthritis   . Asthma    WELL CONTROLLED  . Basal cell carcinoma 05/31/2014   left spinal mid back  . Complication of anesthesia    PT STATES THAT SHE WAS GIVEN SOME TYPE OF ANESTHESIA THAT MADE HER HAVE A SEIZURE IN 2015-NOTE IS IN CHART FROM [place] ANESTHESIA FROM 2015  . Cystic thyroid nodule March 2015   96mm on u/s  . Depression   . Fibromyalgia   . GERD (gastroesophageal reflux disease)   . History of kidney stones    SMALL STONE CURRENTLY  . Hypertension   . Insomnia   . Joint pain 04/2018   PT SEEING RHEUMATOLOGIST FOR JOINT PAIN THAT OCCURS APP ONCE A MONTH AND IS SO BAD THAT SHE CANT GET OUT OF BED  . Obesity   . PONV (postoperative nausea and vomiting)   .  Right thyroid nodule 05/26/2017   Solid 1 cm RIGHT lobe July 2018  . Seizures (New Site) June 2015   provoked during cervical nerve block  . Skin cancer Aug 2015   removed from back  . Vitamin D deficiency disease     Patient Active Problem List   Diagnosis Date Noted  . Traumatic incomplete tear of left rotator cuff 04/23/2020  . Arthralgia 08/03/2019  . Gastric polyp   . Benign neoplasm of transverse colon   . Internal hemorrhoids   . Malignant neoplasm of skin 06/19/2018  . Controlled substance agreement signed 05/12/2018  . 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 Kidney stone 02/20/2018  . Renal lesion 02/20/2018  . Benign hypertension 01/21/2018  . Pelvic pain 12/26/2017  . Major depression in remission ([place]) 12/26/2017  . GERD (gastroesophageal reflux disease) 11/09/2017  . 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 Elevated C-reactive protein (CRP) 07/11/2017  . Right thyroid nodule 05/26/2017  . Mild hypercholesterolemia 05/06/2017  . Generalized osteoarthritis of hand 12/13/2016  . Impingement syndrome of shoulder region, left 11/29/2016  . Asthma   . Anxiety   . Allergic rhinitis   . Insomnia   . Obesity   . Vitamin D deficiency disease     Past Surgical History:  Procedure Laterality Date  . ABDOMINAL HYSTERECTOMY  2005ish   fibroids, ovaries remain; along with bladder  tack  . bladder sling mesh removal  08/2018  . bladder tack  2005ish  . CHOLECYSTECTOMY    . COLONOSCOPY WITH PROPOFOL N/A 06/22/2018   Procedure: COLONOSCOPY WITH PROPOFOL;  Surgeon: Virgel Manifold, MD;  Location: [facility] ENDOSCOPY;  Service: Endoscopy;  Laterality: N/A;  . CYSTO WITH HYDRODISTENSION N/A 05/02/2018   Procedure: CYSTOSCOPY/HYDRODISTENSION;  Surgeon: Royston Cowper, MD;  Location: [facility] ORS;  Service: Urology;  Laterality: N/A;  . ESOPHAGOGASTRODUODENOSCOPY (EGD) WITH PROPOFOL N/A 06/22/2018   Procedure: ESOPHAGOGASTRODUODENOSCOPY (EGD) WITH PROPOFOL;   Surgeon: Virgel Manifold, MD;  Location: [facility] ENDOSCOPY;  Service: Endoscopy;  Laterality: N/A;  . INCONTINENCE SURGERY    . ROTATOR CUFF REPAIR Right 01/08/2019  . rotator cuff revision Left 12/06/2019  . SKIN CANCER EXCISION  Aug 2015   removed from back    Prior to Admission medications   Medication Sig Start Date End Date Taking? Authorizing Provider  albuterol (PROAIR HFA) 108 (90 Base) MCG/ACT inhaler Inhale 2 puffs into the lungs every 4 (four) hours as needed for wheezing or shortness of breath. 10/18/17   Lada, Satira Anis, MD  albuterol (PROVENTIL) (2.5 MG/3ML) 0.083% nebulizer solution Take 3 mLs (2.5 mg total) by nebulization every 4 (four) hours as needed for wheezing or shortness of breath. 10/18/17   Arnetha Courser, MD  amLODipine (NORVASC) 5 MG tablet Take 1 tablet (5 mg total) by mouth daily. 05/05/20   Steele Sizer, MD  Cholecalciferol (VITAMIN D) 2000 units tablet Take 2,000 Units by mouth daily.     [provider]  cyclobenzaprine (FLEXERIL) 10 MG tablet SMARTSIG:1 Tablet(s) By Mouth Daily PRN 02/19/20   [provider]  DULoxetine (CYMBALTA) 60 MG capsule Take 1 capsule (60 mg total) by mouth daily. 05/05/20   Steele Sizer, MD  fexofenadine [facility] ALLERGY) 180 MG tablet Take 1 tablet (180 mg total) by mouth daily. 11/05/19   Steele Sizer, MD  ibuprofen (ADVIL,MOTRIN) 600 MG tablet Take 600 mg by mouth daily with breakfast.    [provider]  levocetirizine (XYZAL) 5 MG tablet TAKE 1 TABLET BY MOUTH ONCE DAILY IN THE EVENING 02/06/20   Ancil Boozer, Drue Stager, MD  montelukast (SINGULAIR) 10 MG tablet Take 1 tablet (10 mg total) by mouth at bedtime. 05/05/20   Steele Sizer, MD  omeprazole (PRILOSEC) 20 MG capsule Take 20 mg by mouth every morning.     [provider]  ondansetron (ZOFRAN ODT) 4 MG disintegrating tablet Take 1 tablet (4 mg total) by mouth every 8 (eight) hours as needed for nausea or vomiting. 07/02/20   Lucrezia Starch,  MD    Allergies Other, Meloxicam, Compazine  [prochlorperazine], Contrast media [iodinated diagnostic agents], Doxycycline, Gabapentin, Sulfur, Codeine, and Iodine  Family History  Problem Relation Age of Onset  . Hyperlipidemia Mother   . Diabetes Sister   . Hyperlipidemia Sister   . Hypertension Sister   . Hypertension Brother   . Pneumonia Maternal Grandmother   . Heart attack Maternal Grandfather   . Diabetes Sister   . Hypertension Sister   . Epilepsy Sister   . Hypertension Sister   . Hypertension Daughter   . Kidney Stones Daughter   . Cancer Neg Hx   . COPD Neg Hx   . Stroke Neg Hx     Social History Social History   Tobacco Use  . Smoking status: Never Smoker  . Smokeless tobacco: Never Used  Vaping Use  . Vaping Use: Never used  Substance Use Topics  .  Alcohol use: No  . Drug use: No    Review of Systems  Review of Systems  Constitutional: Positive for chills, fever and malaise/fatigue.  HENT: Negative for sore throat.   Eyes: Negative for pain.  Respiratory: Positive for cough and shortness of breath. Negative for stridor.   Cardiovascular: Negative for chest pain.  Gastrointestinal: Positive for diarrhea, nausea and vomiting.  Musculoskeletal: Positive for myalgias.  Skin: Negative for rash.  Neurological: Negative for seizures, loss of consciousness and headaches.  Psychiatric/Behavioral: Negative for suicidal ideas.  All other systems reviewed and are negative.    PHYSICAL EXAM:  VITAL SIGNS: ED Triage Vitals  Enc Vitals Group     BP 07/02/20 1003 127/62     Pulse Rate 07/02/20 1003 88     Resp 07/02/20 1003 20     Temp 07/02/20 1003 99.6 F (37.6 C)     Temp Source 07/02/20 1003 Oral     SpO2 07/02/20 1003 96 %     Weight 07/02/20 0955 186 lb (84.4 kg)     Height 07/02/20 0955 5\' 3"  (1.6 m)     Head Circumference --      Peak Flow --      Pain Score 07/02/20 0955 0     Pain Loc --      Pain Edu? --      Excl. in [place]? --     Vitals:   07/02/20 1226 07/02/20 1519  BP: 138/78 135/83  Pulse: 85 86  Resp: 18 18  Temp: 99.7 F (37.6 C) (!) 101.8 F (38.8 C)  SpO2: 95% 97%   Physical Exam Vitals and nursing note reviewed.  Constitutional:      General: She is not in acute distress.    Appearance: She is well-developed.  HENT:     Head: Normocephalic and atraumatic.     Right Ear: External ear normal.     Left Ear: External ear normal.     Nose: Nose normal.     Mouth/Throat:     Mouth: Mucous membranes are dry.  Eyes:     Conjunctiva/sclera: Conjunctivae normal.  Cardiovascular:     Rate and Rhythm: Normal rate and regular rhythm.     Heart sounds: No murmur heard.   Pulmonary:     Effort: Pulmonary effort is normal. No respiratory distress.     Breath sounds: Normal breath sounds.  Abdominal:     Palpations: Abdomen is soft.     Tenderness: There is no abdominal tenderness. There is no right CVA tenderness or left CVA tenderness.  Musculoskeletal:     Cervical back: Neck supple.     Right lower leg: No edema.     Left lower leg: No edema.  Skin:    General: Skin is warm and dry.     Capillary Refill: Capillary refill takes 2 to 3 seconds.  Neurological:     Mental Status: She is alert and oriented to person, place, and time.  Psychiatric:        Mood and Affect: Mood normal.      ____________________________________________   LABS (all labs ordered are listed, but only abnormal results are displayed)  Labs Reviewed  COMPREHENSIVE METABOLIC PANEL - Abnormal; Notable for the following components:      Result Value   Potassium 3.0 (*)    Glucose, Bld 157 (*)    ALT 47 (*)    All other components within normal limits  CBC  TROPONIN I (HIGH  SENSITIVITY)   ____________________________________________  EKG  Sinus rhythm with a ventricular rate of 87, normal axis, unremarkable intervals, no evidence of acute ischemia or other significant underlying  arrhythmia. ____________________________________________  RADIOLOGY  ED MD interpretation: Bilateral pneumonia.  No evidence of pneumothorax, effusion, or significant edema.  Official radiology report(s): DG Chest 2 View  Result Date: 07/02/2020 CLINICAL DATA:  Shortness of breath EXAM: CHEST - 2 VIEW COMPARISON:  10/14/2010 FINDINGS: Mild patchy pulmonary opacity on both sides. Lung volumes are low and there is atelectatic type density on the left. Normal heart size. No visible effusion or pneumothorax. Cholecystectomy. Exaggerated thoracic kyphosis. IMPRESSION: Mild patchy pneumonia on both sides. Electronically Signed   By: Monte Fantasia M.D.   On: 07/02/2020 10:44    ____________________________________________   PROCEDURES  Procedure(s) performed (including Critical Care):  Procedures   ____________________________________________   INITIAL IMPRESSION / ASSESSMENT AND PLAN / ED COURSE        Overall patient's history, exam, and ED work-up is most consistent with mild dehydration in setting of known COVID-19 infection.  Patient is noted to be febrile with otherwise stable vital signs on room air.  Chest x-ray shows pneumonia consistent with a viral COVID-19 infection.  No wheezing to suggest asthma exacerbation.  No clear focal consolidations or elevated white blood cell count to suggest acute bacterial superinfection.  ECG is reassuring given absence of acute chest pain have low suspicion for ACS at this time.  Low suspicion for PE at this time given patient is in no respiratory distress and has no evidence of tachypnea or hypoxia.  Presentation is not consistent with dissection, perforated viscus, appendicitis, diverticulitis, cystitis, or other acute intra-abdominal pathology.  Patient is noted to be mildly hypokalemic and she was given potassium as well as IV fluids, Toradol, and antiemetic.  Discussed expected clinical course of her Covid illness as well as importance of  adequate hydration.  Patient discharged stable condition.  Strict return precautions advised and discussed.  Medications  potassium chloride 10 mEq in 100 mL IVPB (10 mEq Intravenous New Bag/Given 07/02/20 1713)  acetaminophen (TYLENOL) 325 MG tablet (has no administration in time range)  acetaminophen (TYLENOL) tablet 650 mg (650 mg Oral Given 07/02/20 1654)  lactated ringers bolus 1,000 mL (1,000 mLs Intravenous New Bag/Given 07/02/20 1714)  ondansetron (ZOFRAN) injection 4 mg (4 mg Intravenous Given 07/02/20 1653)  ketorolac (TORADOL) 30 MG/ML injection 15 mg (15 mg Intravenous Given 07/02/20 1715)    ____________________________________________   FINAL CLINICAL IMPRESSION(S) / ED DIAGNOSES  Final diagnoses:  COVID-19  Dehydration  Hypokalemia     ED Discharge Orders         Ordered    ondansetron (ZOFRAN ODT) 4 MG disintegrating tablet  Every 8 hours PRN     Discontinue  Reprint     07/02/20 1706           Note:  This document was prepared using Dragon voice recognition software and may include unintentional dictation errors.   Lucrezia Starch, MD 07/02/20 1721

## 2020-07-02 NOTE — ED Notes (Signed)
Pt confirmed COVID positive on Monday.

## 2020-07-02 NOTE — ED Triage Notes (Signed)
Pt reports was tested Monday for COVID and it was positive. Pt reports some SOB so went to [facility] and was sent to the ED.

## 2020-07-02 NOTE — ED Notes (Signed)
This rn received a phone call from pharmacy requesting for this  rn to ask pt is she has ever taken NSAID". This Rn asked pt, pt st "I have taken ibuprofen" denies allergy.

## 2020-07-04 ENCOUNTER — Other Ambulatory Visit: Payer: Self-pay

## 2020-07-04 ENCOUNTER — Telehealth (INDEPENDENT_AMBULATORY_CARE_PROVIDER_SITE_OTHER): Payer: BC Managed Care – PPO | Admitting: Family Medicine

## 2020-07-04 ENCOUNTER — Encounter: Payer: Self-pay | Admitting: Family Medicine

## 2020-07-04 VITALS — Temp 100.3°F

## 2020-07-04 DIAGNOSIS — R0602 Shortness of breath: Secondary | ICD-10-CM | POA: Diagnosis not present

## 2020-07-04 DIAGNOSIS — U071 COVID-19: Secondary | ICD-10-CM

## 2020-07-04 DIAGNOSIS — J189 Pneumonia, unspecified organism: Secondary | ICD-10-CM

## 2020-07-04 DIAGNOSIS — R079 Chest pain, unspecified: Secondary | ICD-10-CM | POA: Diagnosis not present

## 2020-07-04 NOTE — Progress Notes (Signed)
Name: Yolanda Pace   MRN: [id number]    DOB: [birth year]   Date:07/04/2020       Progress Note  Subjective:    Chief Complaint  Chief Complaint  Patient presents with   COVID Positive   Cough    chills, fever, nausea, vomiting, headache, chest pains for 10 days    I connected with  [name] on 07/04/20 at  1:40 PM EDT by telephone and verified that I am speaking with the correct person using two identifiers.   I discussed the limitations, risks, security and privacy concerns of performing an evaluation and management service by telephone and the availability of in person appointments. Staff also discussed with the patient that there may be a patient responsible charge related to this service. Patient Location: home  Provider Location: [facility] clinic Additional Individuals present: none  Called and sent link to pt at 2:00, pending pt connecting to video call till 2:08 PM  She is ill with covid, dx 10 d ago, sx persistent and worsening.  CMA reports pt was barely able to talk on the phone when called for screening/vitals/rooming Temp 100.3  Cough This is a new problem. The current episode started 1 to 4 weeks ago. The problem has been gradually worsening. The problem occurs constantly. The cough is productive of sputum. Associated symptoms include chest pain, chills, a fever, headaches, myalgias, nasal congestion and shortness of breath. Her past medical history is significant for asthma.   Pt with obesity, asthma, HTN presents for f/up on COVID onset 10 days ago, dx only about 4 days ago.  She went to walk in UC and then referred to the ER 2 days ago, potassium was low, it was replaced.  CXR was pertinent for bilateral viral pneumonia.  She was given IV fluids, antiemetics,  IN the ER on 8/18 VSS - Pulse ox was 96%, HR 88, BP 127/62, troponin was neg, EKG was unremarkable  She was sent home with supportive tx, zofran and potassium supplement, deemed stable due to no hypoxia or  tachypnea, she was given return precautions.  Today she states that she has worsened, has severe freq coughing, post-tussive emesis, CP, SOB, fatigue, inhaler is not helping, zofran not helping She does not want to go back to [facility] cause she waited 10 hours there the other day  Not vaccinated for COVID, sick contact - husband   Patient Active Problem List   Diagnosis Date Noted   Traumatic incomplete tear of left rotator cuff 04/23/2020   Arthralgia 08/03/2019   Gastric polyp    Benign neoplasm of transverse colon    Internal hemorrhoids    Malignant neoplasm of skin 06/19/2018   Controlled substance agreement signed 58/83/2549   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   Kidney stone 02/20/2018   Renal lesion 02/20/2018   Benign hypertension 01/21/2018   Pelvic pain 12/26/2017   Major depression in remission ([place]) 12/26/2017   GERD (gastroesophageal reflux disease) 11/09/2017   Elevated beta-2 microglobulin 07/11/2017   Elevated C-reactive protein (CRP) 07/11/2017   Right thyroid nodule 05/26/2017   Mild hypercholesterolemia 05/06/2017   Generalized osteoarthritis of hand 12/13/2016   Impingement syndrome of shoulder region, left 11/29/2016   Asthma    Anxiety    Allergic rhinitis    Insomnia    Obesity    Vitamin D deficiency disease     Social History   Tobacco Use   Smoking status: Never Smoker   Smokeless tobacco:  Never Used  Substance Use Topics   Alcohol use: No     Current Outpatient Medications:    albuterol (PROAIR HFA) 108 (90 Base) MCG/ACT inhaler, Inhale 2 puffs into the lungs every 4 (four) hours as needed for wheezing or shortness of breath., Disp: 1 Inhaler, Rfl: 2   albuterol (PROVENTIL) (2.5 MG/3ML) 0.083% nebulizer solution, Take 3 mLs (2.5 mg total) by nebulization every 4 (four) hours as needed for wheezing or shortness of breath., Disp: 75 mL, Rfl: 1   amLODipine (NORVASC) 5 MG tablet, Take  1 tablet (5 mg total) by mouth daily., Disp: 90 tablet, Rfl: 1   cyclobenzaprine (FLEXERIL) 10 MG tablet, SMARTSIG:1 Tablet(s) By Mouth Daily PRN, Disp: , Rfl:    DULoxetine (CYMBALTA) 60 MG capsule, Take 1 capsule (60 mg total) by mouth daily., Disp: 90 capsule, Rfl: 1   fexofenadine (ALLEGRA ALLERGY) 180 MG tablet, Take 1 tablet (180 mg total) by mouth daily., Disp: 90 tablet, Rfl: 1   ibuprofen (ADVIL,MOTRIN) 600 MG tablet, Take 600 mg by mouth daily with breakfast., Disp: , Rfl:    levocetirizine (XYZAL) 5 MG tablet, TAKE 1 TABLET BY MOUTH ONCE DAILY IN THE EVENING, Disp: 90 tablet, Rfl: 2   montelukast (SINGULAIR) 10 MG tablet, Take 1 tablet (10 mg total) by mouth at bedtime., Disp: 90 tablet, Rfl: 1   omeprazole (PRILOSEC) 20 MG capsule, Take 20 mg by mouth every morning. , Disp: , Rfl:    ondansetron (ZOFRAN ODT) 4 MG disintegrating tablet, Take 1 tablet (4 mg total) by mouth every 8 (eight) hours as needed for nausea or vomiting., Disp: 20 tablet, Rfl: 0   Cholecalciferol (VITAMIN D) 2000 units tablet, Take 2,000 Units by mouth daily. , Disp: , Rfl:   Allergies  Allergen Reactions   Other     [place]: vomiting    Meloxicam Itching   Compazine  [Prochlorperazine] Other (See Comments)    Jaws locked up   Contrast Media [Iodinated Diagnostic Agents] Swelling    Noted in [facility]. chart on 01/06/2012. Premedicate patient prior to contrast media injections.   Doxycycline Nausea Only    GI upset   Gabapentin Nausea And Vomiting    Lip numbness    Sulfur Other (See Comments)    Made all blood vessels in eyes burst   Codeine Nausea And Vomiting   Iodine Swelling    Chart Review: I personally reviewed active problem list, medication list, allergies, family history, social history, health maintenance, notes from last encounter, lab results, imaging with the patient/caregiver today.   Review of Systems  Constitutional: Positive for chills, diaphoresis, fatigue and  fever.  Respiratory: Positive for cough, chest tightness and shortness of breath.   Cardiovascular: Positive for chest pain.  Gastrointestinal: Positive for nausea and vomiting.  Musculoskeletal: Positive for myalgias.  Neurological: Positive for headaches.  10 Systems reviewed and are negative for acute change except as noted in the HPI.    Objective:    Virtual encounter, vitals limited, only able to obtain the following Today's Vitals   07/04/20 1331  Temp: 100.3 F (37.9 C)  TempSrc: Oral   There is no height or weight on file to calculate BMI. Nursing Note and Vital Signs reviewed.  Physical Exam Pt ill appearing, frequent coughing and vomiting, able to sometimes speak in full sentences, appears fatigued, no VS available other than temp of 100.3 No respiratory distress, retractions or visualized accessory muscle use  PE limited by telephone encounter  Results for  orders placed or performed during the hospital encounter of 07/02/20 (from the past 72 hour(s))  Troponin I (High Sensitivity)     Status: None   Collection Time: 07/02/20 10:03 AM  Result Value Ref Range   Troponin I (High Sensitivity) 3 <18 ng/L    Comment: (NOTE) Elevated high sensitivity troponin I (hsTnI) values and significant  changes across serial measurements may suggest ACS but many other  chronic and acute conditions are known to elevate hsTnI results.  Refer to the "Links" section for chest pain algorithms and additional  guidance. Performed at [facility], [address] [place], [place] [address]   CBC     Status: None   Collection Time: 07/02/20 10:03 AM  Result Value Ref Range   WBC 6.3 4.0 - 10.5 K/uL   RBC 5.08 3.87 - 5.11 MIL/uL   Hemoglobin 15.0 12.0 - 15.0 g/dL   HCT 43.0 36 - 46 %   MCV 84.6 80.0 - 100.0 fL   MCH 29.5 26.0 - 34.0 pg   MCHC 34.9 30.0 - 36.0 g/dL   RDW 12.6 11.5 - 15.5 %   Platelets 192 150 - 400 K/uL   nRBC 0.0 0.0 - 0.2 %    Comment: Performed at  [facility], [address] [place], [place] [address]  Comprehensive metabolic panel     Status: Abnormal   Collection Time: 07/02/20 10:03 AM  Result Value Ref Range   Sodium 136 135 - 145 mmol/L   Potassium 3.0 (L) 3.5 - 5.1 mmol/L   Chloride 101 98 - 111 mmol/L   CO2 22 22 - 32 mmol/L   Glucose, Bld 157 (H) 70 - 99 mg/dL    Comment: Glucose reference range applies only to samples taken after fasting for at least 8 hours.   BUN 14 6 - 20 mg/dL   Creatinine, Ser 0.52 0.44 - 1.00 mg/dL   Calcium 9.3 8.9 - 10.3 mg/dL   Total Protein 7.7 6.5 - 8.1 g/dL   Albumin 4.1 3.5 - 5.0 g/dL   AST 34 15 - 41 U/L   ALT 47 (H) 0 - 44 U/L   Alkaline Phosphatase 88 38 - 126 U/L   Total Bilirubin 0.8 0.3 - 1.2 mg/dL   GFR calc non Af Amer >60 >60 mL/min   GFR calc Af Amer >60 >60 mL/min   Anion gap 13 5 - 15    Comment: Performed at [facility], [address] [place], [place] [address]    Assessment and Plan:     ICD-10-CM   1. SOB (shortness of breath)  R06.02   2. Chest pain, unspecified type  R07.9   3. Multifocal pneumonia  J18.9   4. COVID-19  U07.1    tested positive, worsening sx, including SOB, CP, unable to tolerate PO, febrile, weak - advised going back to ER immediately    Pt advised to present to ED for reevaluation Offered to call 911 for the pt but she refused and said her husband will take her in "a little while" advised to go right now.  Reassured her that if she has stable VS and has to wait in the ER that is a good sign that she does not have hypoxia or respiratory failure, but unfortunately I cannot determine that over virtual encounter today and all OTC and Rx meds have not worked for supportive measures and she needs reevaluation ASAP.  I provided 20+ minutes of non-face-to-face time during this  encounter.  Delsa Grana, PA-C 07/04/20 2:28 PM

## 2020-07-07 DIAGNOSIS — U071 COVID-19: Secondary | ICD-10-CM | POA: Insufficient documentation

## 2020-07-07 DIAGNOSIS — E876 Hypokalemia: Secondary | ICD-10-CM | POA: Insufficient documentation

## 2020-07-07 DIAGNOSIS — J4520 Mild intermittent asthma, uncomplicated: Secondary | ICD-10-CM | POA: Insufficient documentation

## 2020-07-07 DIAGNOSIS — G8929 Other chronic pain: Secondary | ICD-10-CM | POA: Insufficient documentation

## 2020-07-07 DIAGNOSIS — R112 Nausea with vomiting, unspecified: Secondary | ICD-10-CM | POA: Insufficient documentation

## 2020-07-07 DIAGNOSIS — M545 Low back pain, unspecified: Secondary | ICD-10-CM | POA: Insufficient documentation

## 2020-07-07 DIAGNOSIS — R197 Diarrhea, unspecified: Secondary | ICD-10-CM | POA: Insufficient documentation

## 2020-07-08 ENCOUNTER — Telehealth: Payer: BC Managed Care – PPO | Admitting: Internal Medicine

## 2020-07-28 ENCOUNTER — Encounter: Payer: BC Managed Care – PPO | Admitting: Dermatology

## 2020-08-06 ENCOUNTER — Institutional Professional Consult (permissible substitution): Payer: BC Managed Care – PPO | Admitting: Pulmonary Disease

## 2020-08-11 ENCOUNTER — Other Ambulatory Visit: Payer: Self-pay

## 2020-08-11 ENCOUNTER — Encounter: Payer: Self-pay | Admitting: Dermatology

## 2020-08-11 ENCOUNTER — Ambulatory Visit: Payer: BC Managed Care – PPO | Admitting: Dermatology

## 2020-08-11 DIAGNOSIS — D0461 Carcinoma in situ of skin of right upper limb, including shoulder: Secondary | ICD-10-CM | POA: Diagnosis not present

## 2020-08-11 DIAGNOSIS — L719 Rosacea, unspecified: Secondary | ICD-10-CM | POA: Diagnosis not present

## 2020-08-11 DIAGNOSIS — L578 Other skin changes due to chronic exposure to nonionizing radiation: Secondary | ICD-10-CM | POA: Diagnosis not present

## 2020-08-11 DIAGNOSIS — D099 Carcinoma in situ, unspecified: Secondary | ICD-10-CM

## 2020-08-11 DIAGNOSIS — L820 Inflamed seborrheic keratosis: Secondary | ICD-10-CM | POA: Diagnosis not present

## 2020-08-11 DIAGNOSIS — C4492 Squamous cell carcinoma of skin, unspecified: Secondary | ICD-10-CM

## 2020-08-11 HISTORY — DX: Squamous cell carcinoma of skin, unspecified: C44.92

## 2020-08-11 NOTE — Patient Instructions (Addendum)
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 Cleanse wound gently with soap and water once a day then pat dry with clean gauze. Apply a thing coat of Petrolatum (petroleum jelly, "Vaseline") over the wound (unless you have an allergy to this). We recommend that you use a new, sterile tube of Vaseline. Do not pick or remove scabs. Do not remove the yellow or white "healing tissue" from the base of the wound.  2. Cover the wound with fresh, clean, nonstick gauze and secure with paper tape. You may use Band-Aids in place of gauze and tape if the would is small enough, but would recommend trimming much of the tape off as there is often too much. Sometimes Band-Aids can irritate the skin.  3. You should call the office for your biopsy report after 1 week if you have not already been contacted.  4. If you experience any problems, such as abnormal amounts of bleeding, swelling, significant bruising, significant pain, or evidence of infection, please call the office immediately.

## 2020-08-11 NOTE — Progress Notes (Signed)
   Follow-Up Visit   Subjective  Yolanda Pace is a 54 y.o. female who presents for the following: SCC IS (right forearm, biopsy proven. Treat with EDC today.) and red spot (x 2 months, left infraocular. Using Aquaphor to area at night. ).   The following portions of the chart were reviewed this encounter and updated as appropriate:      Review of Systems:  No other skin or systemic complaints except as noted in HPI or Assessment and Plan.  Objective  Well appearing patient in no apparent distress; mood and affect are within normal limits.  A focused examination was performed including right forearm. Relevant physical exam findings are noted in the Assessment and Plan.  Objective  Right Forearm: Pink biopsy scar.  Objective  Left Infraocular: Tiny pink/flesh keratotic or waxy stuck-on papule .   Objective  Left Infraocular: Mild erythema.   Assessment & Plan   Actinic Damage - diffuse scaly erythematous macules with underlying dyspigmentation - Recommend daily broad spectrum sunscreen SPF 30+ to sun-exposed areas, reapply every 2 hours as needed.  - Call for new or changing lesions.   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  Destruction of lesion  Destruction method: electrodesiccation and curettage   Informed consent: discussed and consent obtained   Timeout:  patient name, date of birth, surgical site, and procedure verified Anesthesia: the lesion was anesthetized in a standard fashion   Anesthesia comment:  Prepped with Alcohol Anesthetic:  1% lidocaine w/ epinephrine 1-100,000 buffered w/ 8.4% NaHCO3 Curettage performed in three different directions: Yes   Electrodesiccation performed over the curetted area: Yes   Lesion length (cm):  0.8 Lesion width (cm):  0.8 Margin per side (cm):  0.2 Final wound size (cm):  1.2 Hemostasis achieved with:  pressure, aluminum chloride and electrodesiccation Outcome: patient tolerated procedure well with no  complications   Post-procedure details: wound care instructions given    Inflamed seborrheic keratosis Left Infraocular  Benign, observe.  Will consider Ln2 if not improved on follow-up  Eucrisa ointment qd  Rosacea Left Infraocular  Start Eucrisa Ointment Apply to AA qam - sample given. Start Soolantra Cream Apply to AA qhs - sample given.  Return as scheduled., for Will recheck SCC site and Rosacea at Lipoma Surgery appt.Lindi Adie, CMA, am acting as scribe for Brendolyn Patty, MD .  Documentation: I have reviewed the above documentation for accuracy and completeness, and I agree with the above.  Brendolyn Patty MD

## 2020-08-12 ENCOUNTER — Other Ambulatory Visit: Payer: Self-pay

## 2020-08-12 ENCOUNTER — Ambulatory Visit
Admission: RE | Admit: 2020-08-12 | Discharge: 2020-08-12 | Disposition: A | Discharge status: Home / Self Care | Payer: BC Managed Care – PPO | Source: Ambulatory Visit | Attending: Hospice and Palliative Medicine | Admitting: Hospice and Palliative Medicine

## 2020-08-12 ENCOUNTER — Ambulatory Visit: Payer: BC Managed Care – PPO | Admitting: Internal Medicine

## 2020-08-12 ENCOUNTER — Ambulatory Visit
Admission: RE | Admit: 2020-08-12 | Discharge: 2020-08-12 | Disposition: A | Discharge status: Home / Self Care | Payer: BC Managed Care – PPO | Attending: Hospice and Palliative Medicine | Admitting: Hospice and Palliative Medicine

## 2020-08-12 ENCOUNTER — Encounter: Payer: Self-pay | Admitting: Internal Medicine

## 2020-08-12 DIAGNOSIS — Z8616 Personal history of COVID-19: Secondary | ICD-10-CM

## 2020-08-12 DIAGNOSIS — R059 Cough, unspecified: Secondary | ICD-10-CM

## 2020-08-12 DIAGNOSIS — R0602 Shortness of breath: Secondary | ICD-10-CM

## 2020-08-12 DIAGNOSIS — R05 Cough: Secondary | ICD-10-CM | POA: Insufficient documentation

## 2020-08-12 MED ORDER — PREDNISONE 10 MG PO TABS: ORAL_TABLET | ORAL | 0 refills | Status: DC

## 2020-08-12 MED ORDER — BENZONATATE 200 MG PO CAPS: 200.0000 mg | ORAL_CAPSULE | Freq: Two times a day (BID) | ORAL | 0 refills | Status: DC | PRN

## 2020-08-12 MED ORDER — BUDESONIDE-FORMOTEROL FUMARATE 80-4.5 MCG/ACT IN AERO: 2.0000 | INHALATION_SPRAY | Freq: Two times a day (BID) | RESPIRATORY_TRACT | 3 refills | Status: DC

## 2020-08-12 NOTE — Progress Notes (Signed)
[facility] [place], [place] [address]  Pulmonary Sleep Medicine   Office Visit Note  Patient Name: Yolanda Pace DOB: 29-Jun-1966 MRN [id number]  Date of Service: 08/14/2020  Complaints/HPI:  Patient is here to establish care as a pulmonary patient  Was diagnosed and hospitalized with COVID 07/07/2020-07/11/2020 During hospitalization she was not intubated, required HFNC and ICU care, treated with Remdesivir, she was weaned to room air prior to discharge Since then she has had a lingering cough and increased shortness of breath She was unable to get an appointment with her PCP, felt it would be appropriate to be seen by pulmonology She has never had baseline pulmonary testing, history of asthma that has been well controlled   ROS  General: (-) fever, (-) chills, (-) night sweats, (-) weakness Skin: (-) rashes, (-) itching,. Eyes: (-) visual changes, (-) redness, (-) itching. Nose and Sinuses: (-) nasal stuffiness or itchiness, (-) postnasal drip, (-) nosebleeds, (-) sinus trouble. Mouth and Throat: (-) sore throat, (-) hoarseness. Neck: (-) swollen glands, (-) enlarged thyroid, (-) neck pain. Respiratory: + cough, (-) bloody sputum, + shortness of breath, - wheezing. Cardiovascular: - ankle swelling, (-) chest pain. Lymphatic: (-) lymph node enlargement. Neurologic: (-) numbness, (-) tingling. Psychiatric: (-) anxiety, (-) depression   Current Medication: Outpatient Encounter Medications as of 08/12/2020  Medication Sig  . albuterol (PROAIR HFA) 108 (90 Base) MCG/ACT inhaler Inhale 2 puffs into the lungs every 4 (four) hours as needed for wheezing or shortness of breath.  Marland Kitchen albuterol (PROVENTIL) (2.5 MG/3ML) 0.083% nebulizer solution Take 3 mLs (2.5 mg total) by nebulization every 4 (four) hours as needed for wheezing or shortness of breath.  Marland Kitchen amLODipine (NORVASC) 5 MG tablet Take 1 tablet (5 mg total) by mouth daily.  . Cholecalciferol (VITAMIN D)  2000 units tablet Take 2,000 Units by mouth daily.   . cyclobenzaprine (FLEXERIL) 10 MG tablet SMARTSIG:1 Tablet(s) By Mouth Daily PRN  . DULoxetine (CYMBALTA) 60 MG capsule Take 1 capsule (60 mg total) by mouth daily.  . fexofenadine (ALLEGRA ALLERGY) 180 MG tablet Take 1 tablet (180 mg total) by mouth daily.  Marland Kitchen ibuprofen (ADVIL,MOTRIN) 600 MG tablet Take 600 mg by mouth daily with breakfast.  . levocetirizine (XYZAL) 5 MG tablet TAKE 1 TABLET BY MOUTH ONCE DAILY IN THE EVENING  . montelukast (SINGULAIR) 10 MG tablet Take 1 tablet (10 mg total) by mouth at bedtime.  Marland Kitchen omeprazole (PRILOSEC) 20 MG capsule Take 20 mg by mouth every morning.   . benzonatate (TESSALON) 200 MG capsule Take 1 capsule (200 mg total) by mouth 2 (two) times daily as needed for cough.  . budesonide-formoterol (SYMBICORT) 80-4.5 MCG/ACT inhaler Inhale 2 puffs into the lungs 2 (two) times daily.  . ondansetron (ZOFRAN ODT) 4 MG disintegrating tablet Take 1 tablet (4 mg total) by mouth every 8 (eight) hours as needed for nausea or vomiting. (Patient not taking: Reported on 08/12/2020)  . predniSONE (DELTASONE) 10 MG tablet Take 1 tablet 3 times a day for 2 days, take 1 tablet 2 times a day for 2 days followed by 1 tablet daily for 3 days.   No facility-administered encounter medications on file as of 08/12/2020.    Surgical History: Past Surgical History:  Procedure Laterality Date  . ABDOMINAL HYSTERECTOMY  2005ish   fibroids, ovaries remain; along with bladder tack  . bladder sling mesh removal  08/2018  . bladder tack  2005ish  . CHOLECYSTECTOMY    .  COLONOSCOPY WITH PROPOFOL N/A 06/22/2018   Procedure: COLONOSCOPY WITH PROPOFOL;  Surgeon: Virgel Manifold, MD;  Location: [facility] ENDOSCOPY;  Service: Endoscopy;  Laterality: N/A;  . CYSTO WITH HYDRODISTENSION N/A 05/02/2018   Procedure: CYSTOSCOPY/HYDRODISTENSION;  Surgeon: Royston Cowper, MD;  Location: [facility] ORS;  Service: Urology;  Laterality: N/A;  .  ESOPHAGOGASTRODUODENOSCOPY (EGD) WITH PROPOFOL N/A 06/22/2018   Procedure: ESOPHAGOGASTRODUODENOSCOPY (EGD) WITH PROPOFOL;  Surgeon: Virgel Manifold, MD;  Location: [facility] ENDOSCOPY;  Service: Endoscopy;  Laterality: N/A;  . INCONTINENCE SURGERY    . ROTATOR CUFF REPAIR Right 01/08/2019  . rotator cuff revision Left 12/06/2019  . SKIN CANCER EXCISION  Aug 2015   removed from back    Medical History: Past Medical History:  Diagnosis Date  . Allergic rhinitis   . Anxiety   . Arthritis   . Asthma    WELL CONTROLLED  . Basal cell carcinoma 05/31/2014   left spinal mid back  . Complication of anesthesia    PT STATES THAT SHE WAS GIVEN SOME TYPE OF ANESTHESIA THAT MADE HER HAVE A SEIZURE IN 2015-NOTE IS IN CHART FROM [place] ANESTHESIA FROM 2015  . Cystic thyroid nodule March 2015   75mm on u/s  . Depression   . Fibromyalgia   . GERD (gastroesophageal reflux disease)   . History of kidney stones    SMALL STONE CURRENTLY  . Hypertension   . Insomnia   . Joint pain 04/2018   PT SEEING RHEUMATOLOGIST FOR JOINT PAIN THAT OCCURS APP ONCE A MONTH AND IS SO BAD THAT SHE CANT GET OUT OF BED  . Obesity   . PONV (postoperative nausea and vomiting)   . Right thyroid nodule 05/26/2017   Solid 1 cm RIGHT lobe July 2018  . Seizures ([place]) June 2015   provoked during cervical nerve block  . Skin cancer Aug 2015   removed from back  . Squamous cell carcinoma of skin 08/11/2020   Right forearm, [facility]  . Vitamin D deficiency disease     Family History: Family History  Problem Relation Age of Onset  . Hyperlipidemia Mother   . Diabetes Sister   . Hyperlipidemia Sister   . Hypertension Sister   . Hypertension Brother   . Pneumonia Maternal Grandmother   . Heart attack Maternal Grandfather   . Diabetes Sister   . Hypertension Sister   . Epilepsy Sister   . Hypertension Sister   . Hypertension Daughter   . Kidney Stones Daughter   . Cancer Neg Hx   . COPD Neg Hx   . Stroke Neg Hx      Social History: Social History   Socioeconomic History  . Marital status: Married    Spouse name: Not on file  . Number of children: 3  . Years of education: Not on file  . Highest education level: Some college, no degree  Occupational History  . Not on file  Tobacco Use  . Smoking status: Never Smoker  . Smokeless tobacco: Never Used  Vaping Use  . Vaping Use: Never used  Substance and Sexual Activity  . Alcohol use: No  . Drug use: No  . Sexual activity: Yes  Other Topics Concern  . Not on file  Social History Narrative  . Not on file   Social Determinants of Health   Financial Resource Strain:   . Difficulty of Paying Living Expenses: Not on file  Food Insecurity:   . Worried About Charity fundraiser in the  Last Year: Not on file  . Ran Out of Food in the Last Year: Not on file  Transportation Needs:   . Lack of Transportation (Medical): Not on file  . Lack of Transportation (Non-Medical): Not on file  Physical Activity:   . Days of Exercise per Week: Not on file  . Minutes of Exercise per Session: Not on file  Stress:   . Feeling of Stress : Not on file  Social Connections:   . Frequency of Communication with Friends and Family: Not on file  . Frequency of Social Gatherings with Friends and Family: Not on file  . Attends Religious Services: Not on file  . Active Member of Clubs or Organizations: Not on file  . Attends Archivist Meetings: Not on file  . Marital Status: Not on file  Intimate Partner Violence:   . Fear of Current or Ex-Partner: Not on file  . Emotionally Abused: Not on file  . Physically Abused: Not on file  . Sexually Abused: Not on file    Vital Signs: Blood pressure (!) 157/76, pulse 96, temperature 98.2 F (36.8 C), resp. rate 16, height 5\' 3"  (1.6 m), weight 176 lb 12.8 oz (80.2 kg), SpO2 95 %.  Examination: General Appearance: The patient is well-developed, well-nourished, and in no distress. Skin: Gross inspection  of skin unremarkable. Head: normocephalic, no gross deformities. Eyes: no gross deformities noted. ENT: ears appear grossly normal no exudates. Neck: Supple. No thyromegaly. No LAD. Respiratory: Diminished at bases, no wheezing or rhonchi noted. Cardiovascular: Normal S1 and S2 without murmur or rub. Extremities: No cyanosis. pulses are equal. Neurologic: Alert and oriented. No involuntary movements.  LABS: Recent Results (from the past 2160 hour(s))  Troponin I (High Sensitivity)     Status: None   Collection Time: 07/02/20 10:03 AM  Result Value Ref Range   Troponin I (High Sensitivity) 3 <18 ng/L    Comment: (NOTE) Elevated high sensitivity troponin I (hsTnI) values and significant  changes across serial measurements may suggest ACS but many other  chronic and acute conditions are known to elevate hsTnI results.  Refer to the "Links" section for chest pain algorithms and additional  guidance. Performed at [facility], [address] [place], [place] [address]   CBC     Status: None   Collection Time: 07/02/20 10:03 AM  Result Value Ref Range   WBC 6.3 4.0 - 10.5 K/uL   RBC 5.08 3.87 - 5.11 MIL/uL   Hemoglobin 15.0 12.0 - 15.0 g/dL   HCT 43.0 36 - 46 %   MCV 84.6 80.0 - 100.0 fL   MCH 29.5 26.0 - 34.0 pg   MCHC 34.9 30.0 - 36.0 g/dL   RDW 12.6 11.5 - 15.5 %   Platelets 192 150 - 400 K/uL   nRBC 0.0 0.0 - 0.2 %    Comment: Performed at [facility], [address] [place], [place] [address]  Comprehensive metabolic panel     Status: Abnormal   Collection Time: 07/02/20 10:03 AM  Result Value Ref Range   Sodium 136 135 - 145 mmol/L   Potassium 3.0 (L) 3.5 - 5.1 mmol/L   Chloride 101 98 - 111 mmol/L   CO2 22 22 - 32 mmol/L   Glucose, Bld 157 (H) 70 - 99 mg/dL    Comment: Glucose reference range applies only to samples taken after fasting for at least 8 hours.   BUN 14 6 - 20 mg/dL   Creatinine,  Ser 0.52 0.44 - 1.00 mg/dL   Calcium 9.3 8.9 - 10.3  mg/dL   Total Protein 7.7 6.5 - 8.1 g/dL   Albumin 4.1 3.5 - 5.0 g/dL   AST 34 15 - 41 U/L   ALT 47 (H) 0 - 44 U/L   Alkaline Phosphatase 88 38 - 126 U/L   Total Bilirubin 0.8 0.3 - 1.2 mg/dL   GFR calc non Af Amer >60 >60 mL/min   GFR calc Af Amer >60 >60 mL/min   Anion gap 13 5 - 15    Comment: Performed at [facility], [address] [place], [place] [address]    Radiology: DG Chest 2 View  Result Date: 07/02/2020 CLINICAL DATA:  Shortness of breath EXAM: CHEST - 2 VIEW COMPARISON:  10/14/2010 FINDINGS: Mild patchy pulmonary opacity on both sides. Lung volumes are low and there is atelectatic type density on the left. Normal heart size. No visible effusion or pneumothorax. Cholecystectomy. Exaggerated thoracic kyphosis. IMPRESSION: Mild patchy pneumonia on both sides. Electronically Signed   By: Monte Fantasia M.D.   On: 07/02/2020 10:44   Assessment and Plan: Patient Active Problem List   Diagnosis Date Noted  . Traumatic incomplete tear of left rotator cuff 04/23/2020  . Arthralgia 08/03/2019  . Gastric polyp   . Benign neoplasm of transverse colon   . Internal hemorrhoids   . Malignant neoplasm of skin 06/19/2018  . Controlled substance agreement signed 05/12/2018  . 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 Kidney stone 02/20/2018  . Renal lesion 02/20/2018  . Benign hypertension 01/21/2018  . Pelvic pain 12/26/2017  . Major depression in remission ([place]) 12/26/2017  . GERD (gastroesophageal reflux disease) 11/09/2017  . 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 Elevated C-reactive protein (CRP) 07/11/2017  . Right thyroid nodule 05/26/2017  . Mild hypercholesterolemia 05/06/2017  . Generalized osteoarthritis of hand 12/13/2016  . Impingement syndrome of shoulder region, left 11/29/2016  . Asthma   . Anxiety   . Allergic rhinitis   . Insomnia   . Obesity   . Vitamin D deficiency disease     1. History of  COVID-19 Hospitalized earlier this month, ICU admission, required HFNC. Has been experiencing increased shortness of breath as well as cough since discharge.  2. Shortness of breath Spirometry today FEV1 2.0, 76% of predicted. Will obtain baseline PFT Start Symbicort, assess her response to inhaler at next follow-up visit Complete short course prednisone taper - Pulmonary function test; Future - Spirometry with Graph - budesonide-formoterol (SYMBICORT) 80-4.5 MCG/ACT inhaler; Inhale 2 puffs into the lungs 2 (two) times daily.  Dispense: 1 each; Refill: 3 - predniSONE (DELTASONE) 10 MG tablet; Take 1 tablet 3 times a day for 2 days, take 1 tablet 2 times a day for 2 days followed by 1 tablet daily for 3 days.  Dispense: 13 tablet; Refill: 0  3. Cough Will obtain baseline CXR assess for complications secondary to recent COVID virus Treat cough with benzonatate as needed - DG Chest 2 View; Future - predniSONE (DELTASONE) 10 MG tablet; Take 1 tablet 3 times a day for 2 days, take 1 tablet 2 times a day for 2 days followed by 1 tablet daily for 3 days.  Dispense: 13 tablet; Refill: 0 - benzonatate (TESSALON) 200 MG capsule; Take 1 capsule (200 mg total) by mouth 2 (two) times daily as needed for cough.  Dispense: 30 capsule; Refill: 0  General Counseling: I have discussed the findings of the evaluation  and examination with Joe.  I have also discussed any further diagnostic evaluation thatmay be needed or ordered today. Florentine verbalizes understanding of the findings of todays visit. We also reviewed her medications today and discussed drug interactions and side effects including but not limited excessive drowsiness and altered mental states. We also discussed that there is always a risk not just to her but also people around her. she has been encouraged to call the office with any questions or concerns that should arise related to todays visit.  Orders Placed This Encounter  Procedures  . DG Chest  2 View    Standing Status:   Future    Number of Occurrences:   1    Standing Expiration Date:   08/12/2021    Order Specific Question:   Reason for Exam (SYMPTOM  OR DIAGNOSIS REQUIRED)    Answer:   cough    Order Specific Question:   Is patient pregnant?    Answer:   No    Order Specific Question:   Preferred imaging location?    Answer:   [facility]  . Pulmonary function test    Standing Status:   Future    Number of Occurrences:   1    Standing Expiration Date:   08/12/2021    Order Specific Question:   Where should this test be performed?    Answer:   [facility]  . Spirometry with Graph    Order Specific Question:   Where should this test be performed?    Answer:   [facility]    Order Specific Question:   Basic spirometry    Answer:   Yes     Time spent: 30  I have personally obtained a history, examined the patient, evaluated laboratory and imaging results, formulated the assessment and plan and placed orders. This patient was seen by Casey Burkitt AGNP-C in Collaboration with Dr. Devona Konig as a part of collaborative care agreement.    Allyne Gee, MD [facility] Pulmonary and Critical Care Sleep medicine

## 2020-08-13 ENCOUNTER — Ambulatory Visit (INDEPENDENT_AMBULATORY_CARE_PROVIDER_SITE_OTHER): Payer: BC Managed Care – PPO | Admitting: Internal Medicine

## 2020-08-13 DIAGNOSIS — R0602 Shortness of breath: Secondary | ICD-10-CM | POA: Diagnosis not present

## 2020-08-13 DIAGNOSIS — R059 Cough, unspecified: Secondary | ICD-10-CM

## 2020-08-13 LAB — PULMONARY FUNCTION TEST

## 2020-08-14 ENCOUNTER — Encounter: Payer: Self-pay | Admitting: Internal Medicine

## 2020-08-14 NOTE — Progress Notes (Signed)
Reviewed, will discuss at next visit

## 2020-08-14 NOTE — Patient Instructions (Signed)

## 2020-08-21 NOTE — Procedures (Signed)
[facility] [place], [address]  DATE OF SERVICE: August 13, 2020  Complete Pulmonary Function Testing Interpretation:  FINDINGS:  Forced vital capacity is mildly decreased.  FEV1 is mildly decreased.  FEV1 FVC ratio is normal.  Postbronchodilator there is no significant improvement in the FEV1.  Total lung capacity is mildly decreased.  Residual volume is decreased.  Residual volume total lung capacity ratio is decreased.  FRC is decreased.  DLCO is normal.  IMPRESSION:  This pulmonary function study is consistent with mild restrictive lung disease.  There is no significant response to bronchodilators and DLCO was within normal limits.  Allyne Gee, MD [facility] Pulmonary Critical Care Medicine Sleep Medicine

## 2020-09-02 ENCOUNTER — Ambulatory Visit: Payer: BC Managed Care – PPO | Admitting: Internal Medicine

## 2020-09-15 ENCOUNTER — Ambulatory Visit (INDEPENDENT_AMBULATORY_CARE_PROVIDER_SITE_OTHER): Payer: BC Managed Care – PPO | Admitting: Dermatology

## 2020-09-15 ENCOUNTER — Other Ambulatory Visit: Payer: Self-pay

## 2020-09-15 DIAGNOSIS — D485 Neoplasm of uncertain behavior of skin: Secondary | ICD-10-CM

## 2020-09-15 MED ORDER — MUPIROCIN 2 % EX OINT: TOPICAL_OINTMENT | CUTANEOUS | 0 refills | Status: DC

## 2020-09-15 MED ORDER — CEFDINIR 300 MG PO CAPS: 300.0000 mg | ORAL_CAPSULE | Freq: Two times a day (BID) | ORAL | 0 refills | Status: AC

## 2020-09-15 NOTE — Patient Instructions (Signed)

## 2020-09-15 NOTE — Progress Notes (Signed)
   Follow-Up Visit   Subjective  Yolanda Pace is a 54 y.o. female who presents for the following: Lipoma (left inferior buttock, surgery today).  The following portions of the chart were reviewed this encounter and updated as appropriate:      Review of Systems:  No other skin or systemic complaints except as noted in HPI or Assessment and Plan.  Objective  Well appearing patient in no apparent distress; mood and affect are within normal limits.  A focused examination was performed including buttock. Relevant physical exam findings are noted in the Assessment and Plan.  Objective  Left Inferior Buttock: 2.5 x 2.0cm rubbery nodule- L inferior medial buttock   Assessment & Plan  Neoplasm of uncertain behavior of skin Left Inferior Buttock  Skin excision  Lesion length (cm):  2.5 Lesion width (cm):  2 Total excision diameter (cm):  2.5 Informed consent: discussed and consent obtained   Timeout: patient name, date of birth, surgical site, and procedure verified   Procedure prep:  Patient was prepped and draped in usual sterile fashion Prep type:  Chlorhexidine Anesthesia: the lesion was anesthetized in a standard fashion   Anesthetic:  1% lidocaine w/ epinephrine 1-100,000 buffered w/ 8.4% NaHCO3 (11cc) Instrument used: #15 blade   Hemostasis achieved with: pressure   Outcome: patient tolerated procedure well with no complications    Skin repair Complexity:  Intermediate Final length (cm):  1.8 Informed consent: discussed and consent obtained   Reason for type of repair: reduce the risk of dehiscence, infection, and necrosis and reduce subcutaneous dead space and avoid a hematoma   Undermining: edges undermined   Subcutaneous layers (deep stitches):  Suture size:  4-0 Suture type: Vicryl (polyglactin 910)   Stitches:  Buried vertical mattress Fine/surface layer approximation (top stitches):  Suture size:  4-0 Suture type: nylon   Stitches: simple interrupted   Suture  removal (days):  7 Hemostasis achieved with: suture Outcome: patient tolerated procedure well with no complications   Post-procedure details: sterile dressing applied and wound care instructions given   Dressing type: pressure dressing (mupirocin)    cefdinir (OMNICEF) 300 MG capsule  mupirocin ointment (BACTROBAN) 2 %  Specimen 1 - Surgical pathology Differential Diagnosis:  Check Margins: No 1.5cm rubbery nodule  Probable Lipoma vrs Fibrolipoma, symptomatic Start Cefdinir 300mg  take 1 po BID x 7 days dsp #14 0Rf Start Mupirocin 2% Ointment Appy to AA once daily with bandage change dsp 22g 0Rf  Return in about 1 week (around 09/22/2020) for SR.  I, Jamesetta Orleans, CMA, am acting as scribe for Brendolyn Patty, MD .  Documentation: I have reviewed the above documentation for accuracy and completeness, and I agree with the above.  Brendolyn Patty MD

## 2020-09-16 ENCOUNTER — Encounter: Payer: Self-pay | Admitting: Internal Medicine

## 2020-09-16 ENCOUNTER — Telehealth: Payer: Self-pay

## 2020-09-16 ENCOUNTER — Ambulatory Visit (INDEPENDENT_AMBULATORY_CARE_PROVIDER_SITE_OTHER): Payer: BC Managed Care – PPO | Admitting: Internal Medicine

## 2020-09-16 VITALS — BP 138/79 | HR 91 | Temp 97.8°F | Resp 16 | Ht 63.0 in | Wt 179.0 lb

## 2020-09-16 DIAGNOSIS — R059 Cough, unspecified: Secondary | ICD-10-CM | POA: Diagnosis not present

## 2020-09-16 DIAGNOSIS — R0602 Shortness of breath: Secondary | ICD-10-CM

## 2020-09-16 DIAGNOSIS — Z8616 Personal history of COVID-19: Secondary | ICD-10-CM | POA: Diagnosis not present

## 2020-09-16 MED ORDER — BUDESONIDE-FORMOTEROL FUMARATE 80-4.5 MCG/ACT IN AERO: 2.0000 | INHALATION_SPRAY | Freq: Two times a day (BID) | RESPIRATORY_TRACT | 3 refills | Status: DC

## 2020-09-16 NOTE — Progress Notes (Signed)
[facility] [place], [place] [address]  Pulmonary Sleep Medicine   Office Visit Note  Patient Name: Yolanda Pace DOB: November 23, 1965 MRN [id number]  Date of Service: 09/19/2020  Complaints/HPI: Patient is being seen today for routine pulmonary follow-up Initially referred by PCP for shortness of breath and lingering cough s/p COVID19 infection PFT 9/29 mild restrictive lung disease, DLCO normal Treated with short course of prednisone taper, was unable to afford Symbicort at that time Since initial visit, she feels her symptoms have significantly improved--continues to have some shortness of breath with exertion but feels this has greatly improved CXR normal 9/30   ROS  General: (-) fever, (-) chills, (-) night sweats, (-) weakness Skin: (-) rashes, (-) itching,. Eyes: (-) visual changes, (-) redness, (-) itching. Nose and Sinuses: (-) nasal stuffiness or itchiness, (-) postnasal drip, (-) nosebleeds, (-) sinus trouble. Mouth and Throat: (-) sore throat, (-) hoarseness. Neck: (-) swollen glands, (-) enlarged thyroid, (-) neck pain. Respiratory: - cough, (-) bloody sputum, + shortness of breath, - wheezing. Cardiovascular: - ankle swelling, (-) chest pain. Lymphatic: (-) lymph node enlargement. Neurologic: (-) numbness, (-) tingling. Psychiatric: (-) anxiety, (-) depression   Current Medication: Outpatient Encounter Medications as of 09/16/2020  Medication Sig  . albuterol (PROAIR HFA) 108 (90 Base) MCG/ACT inhaler Inhale 2 puffs into the lungs every 4 (four) hours as needed for wheezing or shortness of breath.  Marland Kitchen albuterol (PROVENTIL) (2.5 MG/3ML) 0.083% nebulizer solution Take 3 mLs (2.5 mg total) by nebulization every 4 (four) hours as needed for wheezing or shortness of breath.  Marland Kitchen amLODipine (NORVASC) 5 MG tablet Take 1 tablet (5 mg total) by mouth daily.  . benzonatate (TESSALON) 200 MG capsule Take 1 capsule (200 mg total) by mouth 2 (two) times daily  as needed for cough.  . cefdinir (OMNICEF) 300 MG capsule Take 1 capsule (300 mg total) by mouth 2 (two) times daily for 7 days.  . Cholecalciferol (VITAMIN D) 2000 units tablet Take 2,000 Units by mouth daily.   . cyclobenzaprine (FLEXERIL) 10 MG tablet SMARTSIG:1 Tablet(s) By Mouth Daily PRN  . DULoxetine (CYMBALTA) 60 MG capsule Take 1 capsule (60 mg total) by mouth daily.  . fexofenadine (ALLEGRA ALLERGY) 180 MG tablet Take 1 tablet (180 mg total) by mouth daily.  Marland Kitchen ibuprofen (ADVIL,MOTRIN) 600 MG tablet Take 600 mg by mouth daily with breakfast.  . levocetirizine (XYZAL) 5 MG tablet TAKE 1 TABLET BY MOUTH ONCE DAILY IN THE EVENING  . montelukast (SINGULAIR) 10 MG tablet Take 1 tablet (10 mg total) by mouth at bedtime.  . mupirocin ointment (BACTROBAN) 2 % Apply to affected area once a day with bandage change.  Marland Kitchen omeprazole (PRILOSEC) 20 MG capsule Take 20 mg by mouth every morning.   . ondansetron (ZOFRAN ODT) 4 MG disintegrating tablet Take 1 tablet (4 mg total) by mouth every 8 (eight) hours as needed for nausea or vomiting.  . predniSONE (DELTASONE) 10 MG tablet Take 1 tablet 3 times a day for 2 days, take 1 tablet 2 times a day for 2 days followed by 1 tablet daily for 3 days.  . [DISCONTINUED] budesonide-formoterol (SYMBICORT) 80-4.5 MCG/ACT inhaler Inhale 2 puffs into the lungs 2 (two) times daily.  . budesonide-formoterol (SYMBICORT) 80-4.5 MCG/ACT inhaler Inhale 2 puffs into the lungs 2 (two) times daily.   No facility-administered encounter medications on file as of 09/16/2020.    Surgical History: Past Surgical History:  Procedure Laterality Date  .  ABDOMINAL HYSTERECTOMY  2005ish   fibroids, ovaries remain; along with bladder tack  . bladder sling mesh removal  08/2018  . bladder tack  2005ish  . CHOLECYSTECTOMY    . COLONOSCOPY WITH PROPOFOL N/A 06/22/2018   Procedure: COLONOSCOPY WITH PROPOFOL;  Surgeon: Virgel Manifold, MD;  Location: [facility] ENDOSCOPY;  Service:  Endoscopy;  Laterality: N/A;  . CYSTO WITH HYDRODISTENSION N/A 05/02/2018   Procedure: CYSTOSCOPY/HYDRODISTENSION;  Surgeon: Royston Cowper, MD;  Location: [facility] ORS;  Service: Urology;  Laterality: N/A;  . ESOPHAGOGASTRODUODENOSCOPY (EGD) WITH PROPOFOL N/A 06/22/2018   Procedure: ESOPHAGOGASTRODUODENOSCOPY (EGD) WITH PROPOFOL;  Surgeon: Virgel Manifold, MD;  Location: [facility] ENDOSCOPY;  Service: Endoscopy;  Laterality: N/A;  . INCONTINENCE SURGERY    . ROTATOR CUFF REPAIR Right 01/08/2019  . rotator cuff revision Left 12/06/2019  . SKIN CANCER EXCISION  Aug 2015   removed from back    Medical History: Past Medical History:  Diagnosis Date  . Allergic rhinitis   . Anxiety   . Arthritis   . Asthma    WELL CONTROLLED  . Basal cell carcinoma 05/31/2014   left spinal mid back  . Complication of anesthesia    PT STATES THAT SHE WAS GIVEN SOME TYPE OF ANESTHESIA THAT MADE HER HAVE A SEIZURE IN 2015-NOTE IS IN CHART FROM [place] ANESTHESIA FROM 2015  . Cystic thyroid nodule March 2015   44mm on u/s  . Depression   . Fibromyalgia   . GERD (gastroesophageal reflux disease)   . History of kidney stones    SMALL STONE CURRENTLY  . Hypertension   . Insomnia   . Joint pain 04/2018   PT SEEING RHEUMATOLOGIST FOR JOINT PAIN THAT OCCURS APP ONCE A MONTH AND IS SO BAD THAT SHE CANT GET OUT OF BED  . Obesity   . PONV (postoperative nausea and vomiting)   . Right thyroid nodule 05/26/2017   Solid 1 cm RIGHT lobe July 2018  . Seizures ([place]) June 2015   provoked during cervical nerve block  . Skin cancer Aug 2015   removed from back  . Squamous cell carcinoma of skin 08/11/2020   Right forearm, EDC  . Vitamin D deficiency disease     Family History: Family History  Problem Relation Age of Onset  . Hyperlipidemia Mother   . Diabetes Sister   . Hyperlipidemia Sister   . Hypertension Sister   . Hypertension Brother   . Pneumonia Maternal Grandmother   . Heart attack Maternal  Grandfather   . Diabetes Sister   . Hypertension Sister   . Epilepsy Sister   . Hypertension Sister   . Hypertension Daughter   . Kidney Stones Daughter   . Cancer Neg Hx   . COPD Neg Hx   . Stroke Neg Hx     Social History: Social History   Socioeconomic History  . Marital status: Married    Spouse name: Not on file  . Number of children: 3  . Years of education: Not on file  . Highest education level: Some college, no degree  Occupational History  . Not on file  Tobacco Use  . Smoking status: Never Smoker  . Smokeless tobacco: Never Used  Vaping Use  . Vaping Use: Never used  Substance and Sexual Activity  . Alcohol use: No  . Drug use: No  . Sexual activity: Yes  Other Topics Concern  . Not on file  Social History Narrative  . Not on file  Social Determinants of Health   Financial Resource Strain:   . Difficulty of Paying Living Expenses: Not on file  Food Insecurity:   . Worried About Charity fundraiser in the Last Year: Not on file  . Ran Out of Food in the Last Year: Not on file  Transportation Needs:   . Lack of Transportation (Medical): Not on file  . Lack of Transportation (Non-Medical): Not on file  Physical Activity:   . Days of Exercise per Week: Not on file  . Minutes of Exercise per Session: Not on file  Stress:   . Feeling of Stress : Not on file  Social Connections:   . Frequency of Communication with Friends and Family: Not on file  . Frequency of Social Gatherings with Friends and Family: Not on file  . Attends Religious Services: Not on file  . Active Member of Clubs or Organizations: Not on file  . Attends Archivist Meetings: Not on file  . Marital Status: Not on file  Intimate Partner Violence:   . Fear of Current or Ex-Partner: Not on file  . Emotionally Abused: Not on file  . Physically Abused: Not on file  . Sexually Abused: Not on file    Vital Signs: Blood pressure 138/79, pulse 91, temperature 97.8 F (36.6  C), resp. rate 16, height 5\' 3"  (1.6 m), weight 179 lb (81.2 kg), SpO2 96 %.  Examination: General Appearance: The patient is well-developed, well-nourished, and in no distress. Skin: Gross inspection of skin unremarkable. Head: normocephalic, no gross deformities. Eyes: no gross deformities noted. ENT: ears appear grossly normal no exudates. Neck: Supple. No thyromegaly. No LAD. Respiratory: Clear throughout, no wheeze, rhonchi or rales noted. Cardiovascular: Normal S1 and S2 without murmur or rub. Extremities: No cyanosis. pulses are equal. Neurologic: Alert and oriented. No involuntary movements.  LABS: Recent Results (from the past 2160 hour(s))  Troponin I (High Sensitivity)     Status: None   Collection Time: 07/02/20 10:03 AM  Result Value Ref Range   Troponin I (High Sensitivity) 3 <18 ng/L    Comment: (NOTE) Elevated high sensitivity troponin I (hsTnI) values and significant  changes across serial measurements may suggest ACS but many other  chronic and acute conditions are known to elevate hsTnI results.  Refer to the "Links" section for chest pain algorithms and additional  guidance. Performed at [facility], [address] [place], [place] [address]   CBC     Status: None   Collection Time: 07/02/20 10:03 AM  Result Value Ref Range   WBC 6.3 4.0 - 10.5 K/uL   RBC 5.08 3.87 - 5.11 MIL/uL   Hemoglobin 15.0 12.0 - 15.0 g/dL   HCT 43.0 36 - 46 %   MCV 84.6 80.0 - 100.0 fL   MCH 29.5 26.0 - 34.0 pg   MCHC 34.9 30.0 - 36.0 g/dL   RDW 12.6 11.5 - 15.5 %   Platelets 192 150 - 400 K/uL   nRBC 0.0 0.0 - 0.2 %    Comment: Performed at [facility], [address] [place], [place] [address]  Comprehensive metabolic panel     Status: Abnormal   Collection Time: 07/02/20 10:03 AM  Result Value Ref Range   Sodium 136 135 - 145 mmol/L   Potassium 3.0 (L) 3.5 - 5.1 mmol/L   Chloride 101 98 - 111 mmol/L   CO2 22 22 - 32 mmol/L   Glucose, Bld 157 (H)  70 -  99 mg/dL    Comment: Glucose reference range applies only to samples taken after fasting for at least 8 hours.   BUN 14 6 - 20 mg/dL   Creatinine, Ser 0.52 0.44 - 1.00 mg/dL   Calcium 9.3 8.9 - 10.3 mg/dL   Total Protein 7.7 6.5 - 8.1 g/dL   Albumin 4.1 3.5 - 5.0 g/dL   AST 34 15 - 41 U/L   ALT 47 (H) 0 - 44 U/L   Alkaline Phosphatase 88 38 - 126 U/L   Total Bilirubin 0.8 0.3 - 1.2 mg/dL   GFR calc non Af Amer >60 >60 mL/min   GFR calc Af Amer >60 >60 mL/min   Anion gap 13 5 - 15    Comment: Performed at [facility], [address] [place], [place] [address]  Pulmonary function test     Status: None   Collection Time: 08/13/20  1:15 PM  Result Value Ref Range   FEV1     FVC     FEV1/FVC     TLC     DLCO      Radiology: DG Chest 2 View  Result Date: 08/14/2020 CLINICAL DATA:  History of previous COVID-19 positivity with persistent cough, initial encounter EXAM: CHEST - 2 VIEW COMPARISON:  07/02/2020 FINDINGS: Cardiac shadow is stable. The lungs are well aerated bilaterally. No focal infiltrate or sizable effusion is seen. No bony abnormality is noted. IMPRESSION: No acute abnormality noted. Previously seen airspace opacities have resolved in the interval. Electronically Signed   By: Inez Catalina M.D.   On: 08/14/2020 08:19    No results found.  No results found.    Assessment and Plan: Patient Active Problem List   Diagnosis Date Noted  . Traumatic incomplete tear of left rotator cuff 04/23/2020  . Arthralgia 08/03/2019  . Gastric polyp   . Benign neoplasm of transverse colon   . Internal hemorrhoids   . Malignant neoplasm of skin 06/19/2018  . Controlled substance agreement signed 05/12/2018  . 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 Kidney stone 02/20/2018  . Renal lesion 02/20/2018  . Benign hypertension 01/21/2018  . Pelvic pain 12/26/2017  . Major depression in remission ([place]) 12/26/2017  . GERD (gastroesophageal  reflux disease) 11/09/2017  . 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 Elevated C-reactive protein (CRP) 07/11/2017  . Right thyroid nodule 05/26/2017  . Mild hypercholesterolemia 05/06/2017  . Generalized osteoarthritis of hand 12/13/2016  . Impingement syndrome of shoulder region, left 11/29/2016  . Asthma   . Anxiety   . Allergic rhinitis   . Insomnia   . Obesity   . Vitamin D deficiency disease     1. History of COVID-19 ICU admission for COVID19 infection, HFNC treated--did not require intubation  2. Shortness of breath Significant improvement in symptoms, will resend Symbicort as insurance coverage has improved, also samples for Spiriva given today in office - budesonide-formoterol (SYMBICORT) 80-4.5 MCG/ACT inhaler; Inhale 2 puffs into the lungs 2 (two) times daily.  Dispense: 1 each; Refill: 3  3. Cough Significant improvement in symptoms, will continue with monitoring  General Counseling: I have discussed the findings of the evaluation and examination with Ceclia.  I have also discussed any further diagnostic evaluation thatmay be needed or ordered today. Tytionna verbalizes understanding of the findings of todays visit. We also reviewed her medications today and discussed drug interactions and side effects including but not limited excessive drowsiness and altered mental states. We also discussed that there  is always a risk not just to her but also people around her. she has been encouraged to call the office with any questions or concerns that should arise related to todays visit.     Time spent: 30  I have personally obtained a history, examined the patient, evaluated laboratory and imaging results, formulated the assessment and plan and placed orders. This patient was seen by Casey Burkitt AGNP-C in Collaboration with Dr. Devona Konig as a part of collaborative care agreement.    Allyne Gee, MD [facility] Pulmonary and Critical Care Sleep medicine

## 2020-09-16 NOTE — Telephone Encounter (Signed)
Talked to patient and she is doing fine from surgery yesterday.  

## 2020-09-19 ENCOUNTER — Encounter: Payer: Self-pay | Admitting: Internal Medicine

## 2020-09-19 NOTE — Patient Instructions (Signed)
Budesonide; Formoterol Inhalation What is this medicine? BUDESONIDE; FORMOTEROL (byoo DES oh nide; for [place] te rol) inhalation is a combination of 2 medicines that decrease inflammation and help to open up the airways in your lungs. It is used to treat asthma. It is also used to treat chronic obstructive pulmonary disease (COPD), including chronic bronchitis or emphysema. Do NOT use for an acute asthma or COPD attack. This medicine may be used for other purposes; ask your health care provider or pharmacist if you have questions. COMMON BRAND NAME(S): Symbicort What should I tell my health care provider before I take this medicine? They need to know if you have any of these conditions:  bone problems  diabetes  eye disease, vision problems  heart disease  high blood pressure  history of irregular heartbeat  immune system problems  infection  liver disease  pheochromocytoma  seizures  thyroid disease  an unusual or allergic reaction to budesonide, formoterol, other medicines, foods, dyes, or preservatives  pregnant or trying to get pregnant  breast-feeding How should I use this medicine? This medicine is inhaled through the mouth. Rinse your mouth with water after use. Make sure not to swallow the water. Follow the directions on your prescription label. Do not use more often than directed. Do not stop taking except on your doctor's advice. Make sure that you are using your inhaler correctly. Ask your doctor or health care provider if you have any questions. A special MedGuide will be given to you by the pharmacist with each prescription and refill. Be sure to read this information carefully each time. Talk to your pediatrician regarding the use of this medicine in children. While this drug may be prescribed for children as young as 70 years of age for selected conditions, precautions do apply. Overdosage: If you think you have taken too much of this medicine contact a poison  control center or emergency room at once. NOTE: This medicine is only for you. Do not share this medicine with others. What if I miss a dose? If you miss a dose, use it as soon as you can. If it is almost time for your next dose, use only that dose. Do not use double or extra doses. What may interact with this medicine? Do not take the medicine with any of the following medications:  cisapride  dofetilide  dronedarone  MAOIs like Marplan, Nardil, and Parnate  other medicines that contain long-acting beta-2 agonists (LABAs) like arfomoterol, formoterol, indacaterol, olodaterol, salmeterol, vilanterol  pimozide  procarbazine  thioridazine This medicine may also interact with the following medications:  certain antibiotics like clarithromycin, telithromycin  certain antivirals for HIV or hepatitis  certain heart medicines like atenolol, metoprolol  certain medicines for blood pressure, heart disease, irregular heartbeat  certain medicines for depression, anxiety, or psychotic disturbances  certain medicines for fungal infections like ketoconazole, itraconazole  diuretics  grapefruit juice  mifepristone  other medicines that prolong the QT interval (an abnormal heart rhythm)  some vaccines  steroid medicines like prednisone or cortisone  stimulant medicines for attention disorders, weight loss, or to stay awake  theophylline This list may not describe all possible interactions. Give your health care provider a list of all the medicines, herbs, non-prescription drugs, or dietary supplements you use. Also tell them if you smoke, drink alcohol, or use illegal drugs. Some items may interact with your medicine. What should I watch for while using this medicine? Visit your health care professional for regular checks on your progress.  Tell your health care professional if your symptoms do not start to get better or if they get worse. If your symptoms get worse or if you  need your short-acting inhalers more often, call your doctor right away. This medicine may increase your risk of getting an infection. Tell your doctor or health care professional if you are around anyone with measles or chickenpox, or if you develop sores or blisters that do not heal properly. What side effects may I notice from receiving this medicine? Side effects that you should report to your doctor or health care professional as soon as possible:  allergic reactions like skin rash, itching or hives, swelling of the face, lips, or tongue  anxious  breathing problems  changes in vision, eye pain  muscle cramps or muscle pain  signs and symptoms of a dangerous change in heartbeat or heart rhythm like chest pain; dizziness; fast or irregular heartbeat; palpitations; feeling faint or lightheaded, falls; breathing problems  signs and symptoms of high blood sugar such as being more thirsty or hungry or having to urinate more than normal. You may also feel very tired or have blurry vision  signs and symptoms of infection like fever; chills; cough; sore throat; pain or trouble passing urine  tremors  unusually weak or tired  white patches in the mouth or mouth sores Side effects that usually do not require medical attention (report these to your doctor or health care professional if they continue or are bothersome):  back pain  changes in taste  cough  diarrhea  runny or stuffy nose  upset stomach This list may not describe all possible side effects. Call your doctor for medical advice about side effects. You may report side effects to FDA at 1-800-FDA-1088. Where should I keep my medicine? Keep out of the reach of children. Store in a dry place at room temperature between 20 and 25 degrees C (68 and 77 degrees F). Do not get the inhaler wet. Keep track of the number of doses used. Throw away the inhaler after using the marked number of inhalations or after the expiration date,  whichever comes first. Do not burn or puncture canister. NOTE: This sheet is a summary. It may not cover all possible information. If you have questions about this medicine, talk to your doctor, pharmacist, or health care provider.  2020 Elsevier/Gold Standard (2019-06-18 15:50:03)

## 2020-09-22 ENCOUNTER — Other Ambulatory Visit: Payer: Self-pay

## 2020-09-22 ENCOUNTER — Ambulatory Visit (INDEPENDENT_AMBULATORY_CARE_PROVIDER_SITE_OTHER): Payer: BC Managed Care – PPO | Admitting: Dermatology

## 2020-09-22 DIAGNOSIS — Z4802 Encounter for removal of sutures: Secondary | ICD-10-CM

## 2020-09-22 DIAGNOSIS — Z86007 Personal history of in-situ neoplasm of skin: Secondary | ICD-10-CM

## 2020-09-22 DIAGNOSIS — D171 Benign lipomatous neoplasm of skin and subcutaneous tissue of trunk: Secondary | ICD-10-CM

## 2020-09-22 DIAGNOSIS — L719 Rosacea, unspecified: Secondary | ICD-10-CM

## 2020-09-22 NOTE — Progress Notes (Signed)
   Follow-Up Visit   Subjective  Yolanda Pace is a 54 y.o. female who presents for the following: Follow-up (Patient here today for suture removal. ).  Left inferior buttock, biopsy proven benign lipoma.  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   The following portions of the chart were reviewed this encounter and updated as appropriate:      Review of Systems:  No other skin or systemic complaints except as noted in HPI or Assessment and Plan.  Objective  Well appearing patient in no apparent distress; mood and affect are within normal limits.  A focused examination was performed including face, right arm, buttock. Relevant physical exam findings are noted in the Assessment and Plan.    Assessment & Plan   Encounter for Removal of Sutures - Incision site at the left inferior buttock is clean, dry and intact - Wound cleansed, sutures removed, wound cleansed and steri strips applied.  - Discussed pathology results showing lipoma  - Patient advised to keep steri-strips dry until they fall off. - Scars remodel for a full year. - Once steri-strips fall off, patient can apply over-the-counter silicone scar cream each night to help with scar remodeling if desired. - Patient advised to call with any concerns or if they notice any new or changing lesions. - EDC site of SCCIS R forearm healing well - Rosacea L infraocular improved with topical Eucrisa/Soolantra samples  Return for as scheduled 6 months.  Graciella Belton, RMA, am acting as scribe for Brendolyn Patty, MD . Documentation: I have reviewed the above documentation for accuracy and completeness, and I agree with the above.  Brendolyn Patty MD

## 2020-10-30 NOTE — Progress Notes (Signed)
Name: Yolanda Pace   MRN: [id number]    DOB: 1966/01/23   Date:10/31/2020       Progress Note  Subjective  Chief Complaint  Follow up   HPI   FMS/elevated CRP/Connective Tissue Disease : she was seen by Dr. Jefferson Fuel in the past and we mistakenly sent her to see Dr Kirby Funk of 2020. She states x-rays were done and also labs , inflammatory markers improved and she was given reassurance that is it OA and to continue to monitor.  Takes Duloxetine , no longer taking skelaxin, she does not want to start Lyrica. She states since she had COVID-19 this Fall her fibromyalgia pain has been much worse . Pain at this time is 6/10.   Left shoulder pain: seeing Isabel Caprice, she had a work related injury and is going for a revision of left shoulder surgery January 5 th 2021 , first surgery was 12/2018. She has daily pain. She finished PT. She states second surgery was mostly for frozen shoulder , the pain has been worse since second surgery, pain level 8/10 described as aching and burning posteriorly over her left shoulder blade. She states the discomfort is so intense that she is drinking hot totties every night to control the pain - she is not drinking every night but needs help for the pain   DM in obese: glucose in urine, A1C was  6.4% , 6.1 % up to 7.5 % - glucose that day was 149  back in 05/05/2020, she states since last visit she has changed her diet . Avoiding a lot of carbs, only drinking diet sodas, replacing desserts with fruit She lost 9 lbs since her last visit with me in June . Today A1C 5.9 % and has urine microalbuminuria of 50   Depression in remission: doing well on Duloxetine, she states she knows when she is depressed and is not feeling that way now, has some blue days but mostly feels well  She was diagnosed at age 49  - sudden onset after hysterectomy/bladder sling. She states took years to get back to normal. She was on zoloft for many years, switched to duloxetine  because of FMS and joint problems and is doing well on medication .    GERD: controlled with medication, denies heartburn or indigestion Unchanged   Chronic back pain with radiculitis: under the care of Dr. Phyllis Ginger , she states symptoms started suddenly Feb 2021, she had MRI . She had multiple steroid injections, last injection did not help with symptoms. Dr. Phyllis Ginger offered to refer her to neurosurgeon since injections have not helped. She states has to shift position frequently, cannot stand up for more than 20 minutes. She will let me know who is in network prior to referral   Patient Active Problem List   Diagnosis Date Noted   Osteoarthritis 10/31/2020   Connective tissue disease ([place]) 10/31/2020   Chronic shoulder pain 07/07/2020   Mild intermittent asthma without complication 32/95/1884   Non-intractable vomiting with nausea 07/07/2020   Traumatic incomplete tear of left rotator cuff 04/23/2020   Arthralgia 08/03/2019   Gastric polyp    Benign neoplasm of transverse colon    Internal hemorrhoids    Malignant neoplasm of skin 06/19/2018   Chronic pain syndrome 06/19/2018   Controlled substance agreement signed 16/60/6301   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   Kidney stone 02/20/2018   Renal lesion 02/20/2018   Benign hypertension 01/21/2018   Pelvic pain 12/26/2017  Major depression in remission ([place]) 12/26/2017   GERD (gastroesophageal reflux disease) 11/09/2017   Elevated beta-2 microglobulin 07/11/2017   Elevated C-reactive protein (CRP) 07/11/2017   Right thyroid nodule 05/26/2017   Mild hypercholesterolemia 05/06/2017   Generalized osteoarthritis of hand 12/13/2016   Impingement syndrome of shoulder region, left 11/29/2016   Asthma    Anxiety    Allergic rhinitis    Insomnia    Obesity    Vitamin D deficiency disease     Past Surgical History:  Procedure Laterality Date   ABDOMINAL HYSTERECTOMY   2005ish   fibroids, ovaries remain; along with bladder tack   bladder sling mesh removal  08/2018   bladder tack  2005ish   CHOLECYSTECTOMY     COLONOSCOPY WITH PROPOFOL N/A 06/22/2018   Procedure: COLONOSCOPY WITH PROPOFOL;  Surgeon: Virgel Manifold, MD;  Location: [facility] ENDOSCOPY;  Service: Endoscopy;  Laterality: N/A;   CYSTO WITH HYDRODISTENSION N/A 05/02/2018   Procedure: CYSTOSCOPY/HYDRODISTENSION;  Surgeon: Royston Cowper, MD;  Location: [facility] ORS;  Service: Urology;  Laterality: N/A;   ESOPHAGOGASTRODUODENOSCOPY (EGD) WITH PROPOFOL N/A 06/22/2018   Procedure: ESOPHAGOGASTRODUODENOSCOPY (EGD) WITH PROPOFOL;  Surgeon: Virgel Manifold, MD;  Location: [facility] ENDOSCOPY;  Service: Endoscopy;  Laterality: N/A;   INCONTINENCE SURGERY     ROTATOR CUFF REPAIR Right 01/08/2019   rotator cuff revision Left 12/06/2019   SKIN CANCER EXCISION  Aug 2015   removed from back    Family History  Problem Relation Age of Onset   Hyperlipidemia Mother    Diabetes Sister    Hyperlipidemia Sister    Hypertension Sister    Hypertension Brother    Pneumonia Maternal Grandmother    Heart attack Maternal Grandfather    Diabetes Sister    Hypertension Sister    Epilepsy Sister    Hypertension Sister    Hypertension Daughter    Kidney Stones Daughter    Cancer Neg Hx    COPD Neg Hx    Stroke Neg Hx     Social History   Tobacco Use   Smoking status: Never Smoker   Smokeless tobacco: Never Used  Substance Use Topics   Alcohol use: No     Current Outpatient Medications:    acetaminophen (TYLENOL) 500 MG tablet, Take by mouth., Disp: , Rfl:    acetaminophen (TYLENOL) 650 MG suppository, SMARTSIG:1 SUPPOS Rectally Every 4-6 Hours PRN, Disp: , Rfl:    albuterol (PROAIR HFA) 108 (90 Base) MCG/ACT inhaler, Inhale 2 puffs into the lungs every 4 (four) hours as needed for wheezing or shortness of breath., Disp: 1 Inhaler, Rfl: 2   albuterol (PROVENTIL) (2.5  MG/3ML) 0.083% nebulizer solution, Take 3 mLs (2.5 mg total) by nebulization every 4 (four) hours as needed for wheezing or shortness of breath., Disp: 75 mL, Rfl: 1   albuterol (VENTOLIN HFA) 108 (90 Base) MCG/ACT inhaler, 1 puff as needed, Disp: , Rfl:    amLODipine (NORVASC) 5 MG tablet, Take 1 tablet (5 mg total) by mouth daily., Disp: 90 tablet, Rfl: 1   budesonide-formoterol (SYMBICORT) 80-4.5 MCG/ACT inhaler, Inhale 2 puffs into the lungs 2 (two) times daily., Disp: 1 each, Rfl: 3   Cholecalciferol (VITAMIN D) 2000 units tablet, Take 2,000 Units by mouth daily. , Disp: , Rfl:    Cholecalciferol 50 MCG (2000 UT) CAPS, 1 capsule, Disp: , Rfl:    cyclobenzaprine (FLEXERIL) 10 MG tablet, SMARTSIG:1 Tablet(s) By Mouth Daily PRN, Disp: , Rfl:    DULoxetine (CYMBALTA) 60 MG capsule,  Take 1 capsule (60 mg total) by mouth daily., Disp: 90 capsule, Rfl: 1   ibuprofen (ADVIL,MOTRIN) 600 MG tablet, Take 600 mg by mouth daily with breakfast., Disp: , Rfl:    levocetirizine (XYZAL) 5 MG tablet, TAKE 1 TABLET BY MOUTH ONCE DAILY IN THE EVENING, Disp: 90 tablet, Rfl: 2   montelukast (SINGULAIR) 10 MG tablet, Take 1 tablet (10 mg total) by mouth at bedtime., Disp: 90 tablet, Rfl: 1   omeprazole (PRILOSEC) 20 MG capsule, Take 20 mg by mouth every morning. , Disp: , Rfl:    solifenacin (VESICARE) 10 MG tablet, Take 10 mg by mouth daily., Disp: , Rfl:    fexofenadine (ALLEGRA ALLERGY) 180 MG tablet, Take 1 tablet (180 mg total) by mouth daily. (Patient not taking: Reported on 10/31/2020), Disp: 90 tablet, Rfl: 1  Allergies  Allergen Reactions   Metrizamide Swelling    Noted in [place]. chart on 01/06/2012. Premedicate patient prior to contrast media injections.   Other     [place]: vomiting    Salicylic Acid-Sulfur     Burst eye vessels   Bee Pollen     Sneezing, watery eyes, coughing.    Meloxicam Itching   Compazine  [Prochlorperazine] Other (See Comments)    Jaws locked up    Contrast Media [Iodinated Diagnostic Agents] Swelling    Noted in [facility]. chart on 01/06/2012. Premedicate patient prior to contrast media injections.   Doxycycline Nausea Only    GI upset   Gabapentin Nausea And Vomiting    Lip numbness    Sulfur Other (See Comments)    Made all blood vessels in eyes burst   Codeine Nausea And Vomiting   Hydroxychloroquine Nausea And Vomiting and Nausea Only    Ear clicking   Iodine Swelling   Sulfa Antibiotics Nausea And Vomiting and Other (See Comments)    I personally reviewed active problem list, medication list, allergies, family history, social history, health maintenance with the patient/caregiver today.   ROS  Constitutional: Negative for fever or weight change.  Respiratory: Negative for cough and shortness of breath.   Cardiovascular: Negative for chest pain or palpitations.  Gastrointestinal: Negative for abdominal pain, no bowel changes.  Musculoskeletal: Negative for gait problem or joint swelling.  Skin: Negative for rash.  Neurological: Negative for dizziness or headache.  No other specific complaints in a complete review of systems (except as listed in HPI above).  Objective  Vitals:   10/31/20 1411  BP: 138/88  Pulse: 83  Resp: 16  Temp: 97.8 F (36.6 C)  TempSrc: Oral  SpO2: 97%  Weight: 178 lb 9.6 oz (81 kg)  Height: 5\' 3"  (1.6 m)    Body mass index is 31.64 kg/m.  Physical Exam  Constitutional: Patient appears well-developed and well-nourished. Obese  No distress.  HEENT: head atraumatic, normocephalic, pupils equal and reactive to light, e neck supple Cardiovascular: Normal rate, regular rhythm and normal heart sounds.  No murmur heard. No BLE edema. Pulmonary/Chest: Effort normal and breath sounds normal. No respiratory distress. Abdominal: Soft.  There is no tenderness. Psychiatric: Patient has a normal mood and affect. behavior is normal. Judgment and thought content normal.  Recent Results  (from the past 2160 hour(s))  Pulmonary function test     Status: None   Collection Time: 08/13/20  1:15 PM  Result Value Ref Range   FEV1     FVC     FEV1/FVC     TLC     DLCO  PHQ2/9: Depression screen [facility] 2/9 10/31/2020 08/12/2020 07/04/2020 05/05/2020 11/05/2019  Decreased Interest 1 0 0 0 0  Down, Depressed, Hopeless 1 0 0 0 0  PHQ - 2 Score 2 0 0 0 0  Altered sleeping 1 - 0 2 0  Tired, decreased energy 1 - 0 2 0  Change in appetite 0 - 0 0 0  Feeling bad or failure about yourself  0 - 0 0 0  Trouble concentrating 0 - 0 0 0  Moving slowly or fidgety/restless 0 - 0 0 0  Suicidal thoughts 0 - 0 0 0  PHQ-9 Score 4 - 0 4 0  Difficult doing work/chores Somewhat difficult - Not difficult at all Somewhat difficult -  Some recent data might be hidden    phq 9 is positive   Fall Risk: Fall Risk  10/31/2020 08/12/2020 07/04/2020 05/05/2020 11/05/2019  Falls in the past year? 0 0 0 0 0  Number falls in past yr: 0 - 0 0 0  Injury with Fall? 0 - 0 0 0  Follow up - - Falls evaluation completed - -     Functional Status Survey: Is the patient deaf or have difficulty hearing?: No Does the patient have difficulty seeing, even when wearing glasses/contacts?: No Does the patient have difficulty concentrating, remembering, or making decisions?: Yes Does the patient have difficulty walking or climbing stairs?: Yes Does the patient have difficulty dressing or bathing?: No Does the patient have difficulty doing errands alone such as visiting a doctor's office or shopping?: No    Assessment & Plan  1. Connective tissue disease ([place])  - Ambulatory referral to Rheumatology  2. Benign hypertension  Stop norvasc and try Benicar 40 mg   3. Depression, major, in remission ([place])  - DULoxetine (CYMBALTA) 60 MG capsule; Take 1 capsule (60 mg total) by mouth daily.  Dispense: 90 capsule; Refill: 1  4. Asthma, mild intermittent, well-controlled  - montelukast (SINGULAIR) 10 MG  tablet; Take 1 tablet (10 mg total) by mouth at bedtime.  Dispense: 90 tablet; Refill: 1  5. Perennial allergic rhinitis with seasonal variation  - montelukast (SINGULAIR) 10 MG tablet; Take 1 tablet (10 mg total) by mouth at bedtime.  Dispense: 90 tablet; Refill: 1 - levocetirizine (XYZAL) 5 MG tablet; Take 1 tablet (5 mg total) by mouth every evening.  Dispense: 90 tablet; Refill: 1  6. Cyclic citrullinated peptide (CCP) antibody positive  - Ambulatory referral to Rheumatology  7. Gastroesophageal reflux disease, unspecified whether esophagitis present   8. Chronic pain syndrome   9. Fibromyalgia  - Ambulatory referral to Rheumatology  10. Diabetes mellitus with microalbuminuria (HCC)  - POCT HgB A1C - POCT UA - Microalbumin

## 2020-10-31 ENCOUNTER — Ambulatory Visit: Payer: BC Managed Care – PPO | Admitting: Family Medicine

## 2020-10-31 ENCOUNTER — Other Ambulatory Visit: Payer: Self-pay

## 2020-10-31 ENCOUNTER — Encounter: Payer: Self-pay | Admitting: Family Medicine

## 2020-10-31 VITALS — BP 138/88 | HR 83 | Temp 97.8°F | Resp 16 | Ht 63.0 in | Wt 178.6 lb

## 2020-10-31 DIAGNOSIS — M797 Fibromyalgia: Secondary | ICD-10-CM

## 2020-10-31 DIAGNOSIS — R768 Other specified abnormal immunological findings in serum: Secondary | ICD-10-CM

## 2020-10-31 DIAGNOSIS — R809 Proteinuria, unspecified: Secondary | ICD-10-CM | POA: Diagnosis not present

## 2020-10-31 DIAGNOSIS — M359 Systemic involvement of connective tissue, unspecified: Secondary | ICD-10-CM

## 2020-10-31 DIAGNOSIS — F325 Major depressive disorder, single episode, in full remission: Secondary | ICD-10-CM

## 2020-10-31 DIAGNOSIS — I10 Essential (primary) hypertension: Secondary | ICD-10-CM | POA: Diagnosis not present

## 2020-10-31 DIAGNOSIS — R7989 Other specified abnormal findings of blood chemistry: Secondary | ICD-10-CM

## 2020-10-31 DIAGNOSIS — K219 Gastro-esophageal reflux disease without esophagitis: Secondary | ICD-10-CM

## 2020-10-31 DIAGNOSIS — J3089 Other allergic rhinitis: Secondary | ICD-10-CM

## 2020-10-31 DIAGNOSIS — E1129 Type 2 diabetes mellitus with other diabetic kidney complication: Secondary | ICD-10-CM | POA: Diagnosis not present

## 2020-10-31 DIAGNOSIS — G894 Chronic pain syndrome: Secondary | ICD-10-CM

## 2020-10-31 DIAGNOSIS — J4520 Mild intermittent asthma, uncomplicated: Secondary | ICD-10-CM | POA: Diagnosis not present

## 2020-10-31 DIAGNOSIS — J302 Other seasonal allergic rhinitis: Secondary | ICD-10-CM

## 2020-10-31 DIAGNOSIS — M1990 Unspecified osteoarthritis, unspecified site: Secondary | ICD-10-CM | POA: Insufficient documentation

## 2020-10-31 LAB — POCT UA - MICROALBUMIN: Microalbumin Ur, POC: 50 mg/L

## 2020-10-31 LAB — POCT GLYCOSYLATED HEMOGLOBIN (HGB A1C): Hemoglobin A1C: 5.9 % — AB (ref 4.0–5.6)

## 2020-10-31 MED ORDER — AMLODIPINE BESYLATE 5 MG PO TABS: 5.0000 mg | ORAL_TABLET | Freq: Every day | ORAL | 1 refills | Status: DC

## 2020-10-31 MED ORDER — DULOXETINE HCL 60 MG PO CPEP: 60.0000 mg | ORAL_CAPSULE | Freq: Every day | ORAL | 1 refills | Status: DC

## 2020-10-31 MED ORDER — OLMESARTAN MEDOXOMIL 40 MG PO TABS: 40.0000 mg | ORAL_TABLET | Freq: Every day | ORAL | 0 refills | Status: DC

## 2020-10-31 MED ORDER — LEVOCETIRIZINE DIHYDROCHLORIDE 5 MG PO TABS: 5.0000 mg | ORAL_TABLET | Freq: Every evening | ORAL | 1 refills | Status: DC

## 2020-10-31 MED ORDER — MONTELUKAST SODIUM 10 MG PO TABS
10.0000 mg | ORAL_TABLET | Freq: Every day | ORAL | 1 refills | Status: DC
Start: 2020-10-31 — End: 2021-05-04

## 2020-10-31 MED ORDER — PREGABALIN 50 MG PO CAPS: 50.0000 mg | ORAL_CAPSULE | Freq: Two times a day (BID) | ORAL | 0 refills | Status: DC

## 2020-11-18 ENCOUNTER — Other Ambulatory Visit: Payer: Self-pay

## 2020-11-18 ENCOUNTER — Ambulatory Visit: Payer: BC Managed Care – PPO | Admitting: Hospice and Palliative Medicine

## 2020-11-18 ENCOUNTER — Encounter: Payer: Self-pay | Admitting: Hospice and Palliative Medicine

## 2020-11-18 VITALS — BP 148/78 | HR 78 | Temp 97.9°F | Resp 16 | Ht 63.0 in | Wt 181.0 lb

## 2020-11-18 DIAGNOSIS — R0602 Shortness of breath: Secondary | ICD-10-CM | POA: Diagnosis not present

## 2020-11-18 DIAGNOSIS — R059 Cough, unspecified: Secondary | ICD-10-CM

## 2020-11-18 DIAGNOSIS — I10 Essential (primary) hypertension: Secondary | ICD-10-CM

## 2020-11-18 NOTE — Progress Notes (Signed)
[facility] [address] [place], [place] [address]  Pulmonary Sleep Medicine   Office Visit Note  Patient Name: Yolanda PeoplesLela D Pace DOB: 1966/03/15 MRN [id number]  Date of Service: 11/19/2020  Complaints/HPI:  Patient is here for routine pulmonary follow-up Followed s/p COVID due to continued shortness of breath Today, she feels her breathing has returned to baseline CXR post COVID normal--pneumonia resolved She has not been using Spiriva inhaler or albuterol inhaler as she felt her breathing was well controlled  ROS  General: (-) fever, (-) chills, (-) night sweats, (-) weakness Skin: (-) rashes, (-) itching,. Eyes: (-) visual changes, (-) redness, (-) itching. Nose and Sinuses: (-) nasal stuffiness or itchiness, (-) postnasal drip, (-) nosebleeds, (-) sinus trouble. Mouth and Throat: (-) sore throat, (-) hoarseness. Neck: (-) swollen glands, (-) enlarged thyroid, (-) neck pain. Respiratory: - cough, (-) bloody sputum, - shortness of breath, - wheezing. Cardiovascular: - ankle swelling, (-) chest pain. Lymphatic: (-) lymph node enlargement. Neurologic: (-) numbness, (-) tingling. Psychiatric: (-) anxiety, (-) depression   Current Medication: Outpatient Encounter Medications as of 11/18/2020  Medication Sig  . acetaminophen (TYLENOL) 500 MG tablet Take by mouth.  Marland Kitchen. albuterol (PROVENTIL) (2.5 MG/3ML) 0.083% nebulizer solution Take 3 mLs (2.5 mg total) by nebulization every 4 (four) hours as needed for wheezing or shortness of breath.  Marland Kitchen. albuterol (VENTOLIN HFA) 108 (90 Base) MCG/ACT inhaler Inhale 2 puffs into the lungs 4 (four) times daily as needed.  . budesonide-formoterol (SYMBICORT) 80-4.5 MCG/ACT inhaler Inhale 2 puffs into the lungs 2 (two) times daily.  . Cholecalciferol (VITAMIN D) 2000 units tablet Take 2,000 Units by mouth daily.   . cyclobenzaprine (FLEXERIL) 10 MG tablet SMARTSIG:1 Tablet(s) By Mouth Daily PRN  . DULoxetine (CYMBALTA) 60 MG capsule Take 1  capsule (60 mg total) by mouth daily.  Marland Kitchen. ibuprofen (ADVIL,MOTRIN) 600 MG tablet Take 600 mg by mouth daily with breakfast.  . levocetirizine (XYZAL) 5 MG tablet Take 1 tablet (5 mg total) by mouth every evening.  . montelukast (SINGULAIR) 10 MG tablet Take 1 tablet (10 mg total) by mouth at bedtime.  Marland Kitchen. olmesartan (BENICAR) 40 MG tablet Take 1 tablet (40 mg total) by mouth daily. In place of norvasc  . omeprazole (PRILOSEC) 20 MG capsule Take 20 mg by mouth every morning.   . pregabalin (LYRICA) 50 MG capsule Take 1-2 capsules (50-100 mg total) by mouth 2 (two) times daily.   No facility-administered encounter medications on file as of 11/18/2020.    Surgical History: Past Surgical History:  Procedure Laterality Date  . ABDOMINAL HYSTERECTOMY  2005ish   fibroids, ovaries remain; along with bladder tack  . bladder sling mesh removal  08/2018  . bladder tack  2005ish  . CHOLECYSTECTOMY    . COLONOSCOPY WITH PROPOFOL N/A 06/22/2018   Procedure: COLONOSCOPY WITH PROPOFOL;  Surgeon: Pasty Spillersahiliani, Varnita B, MD;  Location: [facility] ENDOSCOPY;  Service: Endoscopy;  Laterality: N/A;  . CYSTO WITH HYDRODISTENSION N/A 05/02/2018   Procedure: CYSTOSCOPY/HYDRODISTENSION;  Surgeon: Orson ApeWolff, Michael R, MD;  Location: [facility] ORS;  Service: Urology;  Laterality: N/A;  . ESOPHAGOGASTRODUODENOSCOPY (EGD) WITH PROPOFOL N/A 06/22/2018   Procedure: ESOPHAGOGASTRODUODENOSCOPY (EGD) WITH PROPOFOL;  Surgeon: Pasty Spillersahiliani, Varnita B, MD;  Location: [facility] ENDOSCOPY;  Service: Endoscopy;  Laterality: N/A;  . INCONTINENCE SURGERY    . ROTATOR CUFF REPAIR Right 01/08/2019  . rotator cuff revision Left 12/06/2019  . SKIN CANCER EXCISION  Aug 2015   removed from back    Medical History:  Past Medical History:  Diagnosis Date  . Allergic rhinitis   . Anxiety   . Arthritis   . Asthma    WELL CONTROLLED  . Basal cell carcinoma 05/31/2014   left spinal mid back  . Complication of anesthesia    PT STATES THAT SHE WAS GIVEN SOME TYPE  OF ANESTHESIA THAT MADE HER HAVE A SEIZURE IN 2015-NOTE IS IN CHART FROM [place] ANESTHESIA FROM 2015  . Cystic thyroid nodule March 2015   34mm on u/s  . Depression   . Fibromyalgia   . GERD (gastroesophageal reflux disease)   . History of kidney stones    SMALL STONE CURRENTLY  . Hypertension   . Insomnia   . Joint pain 04/2018   PT SEEING RHEUMATOLOGIST FOR JOINT PAIN THAT OCCURS APP ONCE A MONTH AND IS SO BAD THAT SHE CANT GET OUT OF BED  . Obesity   . PONV (postoperative nausea and vomiting)   . Right thyroid nodule 05/26/2017   Solid 1 cm RIGHT lobe July 2018  . Seizures ([place]) June 2015   provoked during cervical nerve block  . Skin cancer Aug 2015   removed from back  . Squamous cell carcinoma of skin 08/11/2020   Right forearm, [facility]  . Vitamin D deficiency disease     Family History: Family History  Problem Relation Age of Onset  . Hyperlipidemia Mother   . Diabetes Sister   . Hyperlipidemia Sister   . Hypertension Sister   . Hypertension Brother   . Pneumonia Maternal Grandmother   . Heart attack Maternal Grandfather   . Diabetes Sister   . Hypertension Sister   . Epilepsy Sister   . Hypertension Sister   . Hypertension Daughter   . Kidney Stones Daughter   . Cancer Neg Hx   . COPD Neg Hx   . Stroke Neg Hx     Social History: Social History   Socioeconomic History  . Marital status: Married    Spouse name: Not on file  . Number of children: 3  . Years of education: Not on file  . Highest education level: Some college, no degree  Occupational History  . Not on file  Tobacco Use  . Smoking status: Never Smoker  . Smokeless tobacco: Never Used  Vaping Use  . Vaping Use: Never used  Substance and Sexual Activity  . Alcohol use: No  . Drug use: No  . Sexual activity: Yes  Other Topics Concern  . Not on file  Social History Narrative  . Not on file   Social Determinants of Health   Financial Resource Strain: Not on file  Food Insecurity: Not  on file  Transportation Needs: Not on file  Physical Activity: Not on file  Stress: Not on file  Social Connections: Not on file  Intimate Partner Violence: Not on file    Vital Signs: Blood pressure (!) 148/78, pulse 78, temperature 97.9 F (36.6 C), resp. rate 16, height 5\' 3"  (1.6 m), weight 181 lb (82.1 kg), SpO2 96 %.  Examination: General Appearance: The patient is well-developed, well-nourished, and in no distress. Skin: Gross inspection of skin unremarkable. Head: normocephalic, no gross deformities. Eyes: no gross deformities noted. ENT: ears appear grossly normal no exudates. Neck: Supple. No thyromegaly. No LAD. Respiratory: Clear throughout, no rhonchi, wheeze or rales noted. Cardiovascular: Normal S1 and S2 without murmur or rub. Extremities: No cyanosis. pulses are equal. Neurologic: Alert and oriented. No involuntary movements.  LABS: Recent Results (  from the past 2160 hour(s))  POCT HgB A1C     Status: Abnormal   Collection Time: 10/31/20  2:50 PM  Result Value Ref Range   Hemoglobin A1C 5.9 (A) 4.0 - 5.6 %   HbA1c POC (<> result, manual entry)     HbA1c, POC (prediabetic range)     HbA1c, POC (controlled diabetic range)    POCT UA - Microalbumin     Status: Abnormal   Collection Time: 10/31/20  2:56 PM  Result Value Ref Range   Microalbumin Ur, POC 50 mg/L   Creatinine, POC     Albumin/Creatinine Ratio, Urine, POC      Radiology: DG Chest 2 View  Result Date: 08/14/2020 CLINICAL DATA:  History of previous COVID-19 positivity with persistent cough, initial encounter EXAM: CHEST - 2 VIEW COMPARISON:  07/02/2020 FINDINGS: Cardiac shadow is stable. The lungs are well aerated bilaterally. No focal infiltrate or sizable effusion is seen. No bony abnormality is noted. IMPRESSION: No acute abnormality noted. Previously seen airspace opacities have resolved in the interval. Electronically Signed   By: Alcide Clever M.D.   On: 08/14/2020 08:19    No results  found.  No results found.    Assessment and Plan: Patient Active Problem List   Diagnosis Date Noted  . Osteoarthritis 10/31/2020  . Connective tissue disease (HCC) 10/31/2020  . Chronic shoulder pain 07/07/2020  . Mild intermittent asthma without complication 07/07/2020  . Non-intractable vomiting with nausea 07/07/2020  . Traumatic incomplete tear of left rotator cuff 04/23/2020  . Arthralgia 08/03/2019  . Gastric polyp   . Benign neoplasm of transverse colon   . Internal hemorrhoids   . Malignant neoplasm of skin 06/19/2018  . Chronic pain syndrome 06/19/2018  . 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 Kidney stone 02/20/2018  . Renal lesion 02/20/2018  . Benign hypertension 01/21/2018  . Pelvic pain 12/26/2017  . Major depression in remission (HCC) 12/26/2017  . GERD (gastroesophageal reflux disease) 11/09/2017  . 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 Elevated C-reactive protein (CRP) 07/11/2017  . Right thyroid nodule 05/26/2017  . Mild hypercholesterolemia 05/06/2017  . Generalized osteoarthritis of hand 12/13/2016  . Impingement syndrome of shoulder region, left 11/29/2016  . Anxiety   . Allergic rhinitis   . Insomnia   . Obesity   . Vitamin D deficiency disease     1. SOB (shortness of breath) Spirometry stable today-continued improvement in symptoms Due to improvement, Spiriva can be held at this time Advised if shortness of breath worsens or use of albuterol inhaler is needed she will need to restart daily Spiriva - Spirometry with Graph  2. Cough No longer experiencing symptoms of cough, breathing has returned to baseline  3. Essential hypertension Aware that BP elevated today--has been elevated the last few days, plans to contact PCP next week to discuss  General Counseling: I have discussed the findings of the evaluation and examination with Meriel.  I have also discussed any further diagnostic evaluation thatmay be  needed or ordered today. Nalia verbalizes understanding of the findings of todays visit. We also reviewed her medications today and discussed drug interactions and side effects including but not limited excessive drowsiness and altered mental states. We also discussed that there is always a risk not just to her but also people around her. she has been encouraged to call the office with any questions or concerns that should arise related to todays visit.  Orders Placed This Encounter  Procedures  .  Spirometry with Graph    Order Specific Question:   Where should this test be performed?    Answer:   [facility]    Order Specific Question:   Basic spirometry    Answer:   Yes    Order Specific Question:   Spirometry pre & post bronchodilator    Answer:   No     Time spent: 30  I have personally obtained a history, examined the patient, evaluated laboratory and imaging results, formulated the assessment and plan and placed orders. This patient was seen by Casey Burkitt AGNP-C in Collaboration with Dr. Devona Konig as a part of collaborative care agreement.    Allyne Gee, MD [facility] Pulmonary and Critical Care Sleep medicine

## 2020-11-19 ENCOUNTER — Encounter: Payer: Self-pay | Admitting: Hospice and Palliative Medicine

## 2020-11-21 ENCOUNTER — Encounter: Payer: Self-pay | Admitting: Family Medicine

## 2020-11-21 ENCOUNTER — Other Ambulatory Visit: Payer: Self-pay | Admitting: Family Medicine

## 2021-01-19 NOTE — Progress Notes (Signed)
Office Visit Note  Patient: Yolanda Pace             Date of Birth: 1966/08/03           MRN: [id number]             PCP: Steele Sizer, MD Referring: Steele Sizer, MD Visit Date: 01/28/2021 Occupation: @GUAROCC @  Subjective:  Pain in multiple joints.   History of Present Illness: Yolanda Pace is a 55 y.o. female seen in consultation per request of her PCP.  According to the patient patient she was seen by me in 2019.  I reviewed records.  She had history of pain in her joints for many years.  In March 2019 she had severe coccyx pain to the point she passed out at work.  The pain increased over time.  And it spread to her other joints.  She was having difficulty walking.  She also had plantar fasciitis for which she had cortisone injections x2.  She complained of bilateral CMC joint pain.  She was seen by rheumatologist in [place] and was diagnosed with possible rheumatoid arthritis and was a started on Plaquenil.  She states the Plaquenil was discontinued due to nausea and bleeding per rectum.  She saw another rheumatologist in the practice who diagnosed her with osteoarthritis and gave her sulindac.  She states the sulindac caused bladder spasm and bleeding per rectum and was discontinued.  She gives history of frequent diarrhea and constipation.  She had a cystoscopy which was negative.  She also gave history of chronic insomnia and fatigue at the time.  During her evaluation in 2019 x-rays were declined by the patient.  Although the clinical findings are consistent with osteoarthritis.  All of her autoimmune work-up was negative.  She had generalized myalgias consistent with fibromyalgia.  HLA-B27 was positive without any features of a spondyloarthropathy. She was seen by Dr.Syed after that visit.  Who did a thorough evaluation and obtain multiple x-rays per patient.  She was told that she had osteoarthritis in multiple joints and advised to take NSAIDs.  She has been also on Cymbalta for  fibromyalgia syndrome.  She states it has not been effective.  She had removal of mesh bladder implant which helped her C-reactive protein levels.  She states she has pain in her cervical spine, lower back.  She has been diagnosed with degenerative disc disease and had been under care of Dr. Lacinda Axon and gets spinal injections.  She complains of discomfort in her left shoulder bilateral elbows bilateral wrists, bilateral hands, bilateral hips, bilateral knees, bilateral ankles, bilateral feet heels and her left foot.  She notices swelling in her wrist joints and her ankle joints. She states that her PCP has prescribed gabapentin, Lyrica and meloxicam but she had intolerance to all those medications.  Activities of Daily Living:  Patient reports morning stiffness for 1 hour.   Patient Reports nocturnal pain.  Difficulty dressing/grooming: Reports Difficulty climbing stairs: Reports Difficulty getting out of chair: Reports Difficulty using hands for taps, buttons, cutlery, and/or writing: Reports  Review of Systems  Constitutional: Positive for fatigue. Negative for night sweats, weight gain and weight loss.  HENT: Positive for mouth dryness. Negative for mouth sores, trouble swallowing, trouble swallowing and nose dryness.   Eyes: Positive for itching. Negative for pain, redness, visual disturbance and dryness.  Respiratory: Positive for shortness of breath. Negative for cough and difficulty breathing.        Followed by pulmonology.  History  of asthma and post Covid symptoms.  Cardiovascular: Positive for palpitations. Negative for chest pain, hypertension, irregular heartbeat and swelling in legs/feet.  Gastrointestinal: Negative for blood in stool, constipation and diarrhea.  Endocrine: Negative for increased urination.  Genitourinary: Negative for difficulty urinating and vaginal dryness.  Musculoskeletal: Positive for arthralgias, joint pain, joint swelling, myalgias, morning stiffness, muscle  tenderness and myalgias. Negative for muscle weakness.  Skin: Negative for color change, rash, hair loss, redness, skin tightness, ulcers and sensitivity to sunlight.  Allergic/Immunologic: Positive for susceptible to infections.  Neurological: Positive for numbness and headaches. Negative for dizziness, memory loss, night sweats and weakness.  Hematological: Negative for bruising/bleeding tendency and swollen glands.  Psychiatric/Behavioral: Positive for depressed mood and sleep disturbance. Negative for confusion. The patient is nervous/anxious.     PMFS History:  Patient Active Problem List   Diagnosis Date Noted  . Osteoarthritis 10/31/2020  . Connective tissue disease ([place]) 10/31/2020  . Chronic shoulder pain 07/07/2020  . Mild intermittent asthma without complication 78/58/8502  . Non-intractable vomiting with nausea 07/07/2020  . Traumatic incomplete tear of left rotator cuff 04/23/2020  . Arthralgia 08/03/2019  . Gastric polyp   . Benign neoplasm of transverse colon   . Internal hemorrhoids   . Malignant neoplasm of skin 06/19/2018  . Chronic pain syndrome 06/19/2018  . 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 Kidney stone 02/20/2018  . Renal lesion 02/20/2018  . Benign hypertension 01/21/2018  . Pelvic pain 12/26/2017  . Major depression in remission ([place]) 12/26/2017  . GERD (gastroesophageal reflux disease) 11/09/2017  . 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 Elevated C-reactive protein (CRP) 07/11/2017  . Right thyroid nodule 05/26/2017  . Mild hypercholesterolemia 05/06/2017  . Generalized osteoarthritis of hand 12/13/2016  . Impingement syndrome of shoulder region, left 11/29/2016  . Anxiety   . Allergic rhinitis   . Insomnia   . Obesity   . Vitamin D deficiency disease     Past Medical History:  Diagnosis Date  . Allergic rhinitis   . Anxiety   . Arthritis   . Asthma    WELL CONTROLLED  . Basal cell carcinoma 05/31/2014    left spinal mid back  . Complication of anesthesia    PT STATES THAT SHE WAS GIVEN SOME TYPE OF ANESTHESIA THAT MADE HER HAVE A SEIZURE IN 2015-NOTE IS IN CHART FROM [place] ANESTHESIA FROM 2015  . Cystic thyroid nodule March 2015   2m on u/s  . Depression   . Fibromyalgia   . GERD (gastroesophageal reflux disease)   . History of kidney stones    SMALL STONE CURRENTLY  . Hypertension   . Insomnia   . Joint pain 04/2018   PT SEEING RHEUMATOLOGIST FOR JOINT PAIN THAT OCCURS APP ONCE A MONTH AND IS SO BAD THAT SHE CANT GET OUT OF BED  . Obesity   . PONV (postoperative nausea and vomiting)   . Right thyroid nodule 05/26/2017   Solid 1 cm RIGHT lobe July 2018  . Seizures ([employer] June 2015   provoked during cervical nerve block  . Skin cancer Aug 2015   removed from back  . Squamous cell carcinoma of skin 08/11/2020   Right forearm, [facility]  . Vitamin D deficiency disease     Family History  Problem Relation Age of Onset  . Hyperlipidemia Mother   . Diabetes Sister   . Hyperlipidemia Sister   . Hypertension Sister   . Hypertension Brother   . Hypertension Son   .  Kidney Stones Son   . Pneumonia Maternal Grandmother   . Heart attack Maternal Grandfather   . Hypertension Sister   . Epilepsy Sister   . Hypertension Sister   . Hypertension Daughter   . Kidney Stones Daughter   . Cancer Neg Hx   . COPD Neg Hx   . Stroke Neg Hx    Past Surgical History:  Procedure Laterality Date  . ABDOMINAL HYSTERECTOMY  2005ish   fibroids, ovaries remain; along with bladder tack  . bladder sling mesh removal  08/2018  . bladder tack  2005ish  . CHOLECYSTECTOMY    . COLONOSCOPY WITH PROPOFOL N/A 06/22/2018   Procedure: COLONOSCOPY WITH PROPOFOL;  Surgeon: Virgel Manifold, MD;  Location: [facility] ENDOSCOPY;  Service: Endoscopy;  Laterality: N/A;  . CYSTO WITH HYDRODISTENSION N/A 05/02/2018   Procedure: CYSTOSCOPY/HYDRODISTENSION;  Surgeon: Royston Cowper, MD;  Location: [facility] ORS;  Service:  Urology;  Laterality: N/A;  . ESOPHAGOGASTRODUODENOSCOPY (EGD) WITH PROPOFOL N/A 06/22/2018   Procedure: ESOPHAGOGASTRODUODENOSCOPY (EGD) WITH PROPOFOL;  Surgeon: Virgel Manifold, MD;  Location: [facility] ENDOSCOPY;  Service: Endoscopy;  Laterality: N/A;  . INCONTINENCE SURGERY    . ROTATOR CUFF REPAIR Left 01/08/2019  . rotator cuff revision Left 12/06/2019  . SKIN CANCER EXCISION  Aug 2015   removed from back   Social History   Social History Narrative  . Not on file   Immunization History  Administered Date(s) Administered  . Pneumococcal Polysaccharide-23 09/11/2010  . Td 11/15/2001  . Tdap 11/15/2008, 11/05/2019     Objective: Vital Signs: BP (!) 150/94 (BP Location: Right Arm, Patient Position: Sitting, Cuff Size: Normal)   Pulse 85   Resp 17   Ht 5' 2.25" (1.581 m)   Wt 181 lb 12.8 oz (82.5 kg)   BMI 32.99 kg/m    Physical Exam Vitals and nursing note reviewed.  Constitutional:      Appearance: She is well-developed.  HENT:     Head: Normocephalic and atraumatic.  Eyes:     Conjunctiva/sclera: Conjunctivae normal.  Cardiovascular:     Rate and Rhythm: Normal rate and regular rhythm.     Heart sounds: Normal heart sounds.  Pulmonary:     Effort: Pulmonary effort is normal.     Breath sounds: Normal breath sounds.  Abdominal:     General: Bowel sounds are normal.     Palpations: Abdomen is soft.  Musculoskeletal:     Cervical back: Normal range of motion.  Lymphadenopathy:     Cervical: No cervical adenopathy.  Skin:    General: Skin is warm and dry.     Capillary Refill: Capillary refill takes less than 2 seconds.  Neurological:     Mental Status: She is alert and oriented to person, place, and time.  Psychiatric:        Behavior: Behavior normal.      Musculoskeletal Exam: C-spine was in good range of motion.  She has thoracic kyphosis.  She had painful range of motion for lumbar spine.  Left shoulder joint abduction was limited to 90 degrees.  Right  shoulder joint was in good range of motion.  Elbow joints and wrist joints with good range of motion.  She had bilateral CMC PIP and DIP thickening consistent with osteoarthritis.  No synovitis was noted.  Hip joints were in good range of motion.  She had tenderness over bilateral trochanteric bursa.  She had good range of motion of bilateral knee joints with discomfort.  No warmth swelling  or effusion was noted.  No swelling or synovitis was noted over ankle joints.  There was bilateral hammertoes.  CDAI Exam: CDAI Score: - Patient Global: -; Provider Global: - Swollen: -; Tender: - Joint Exam 01/28/2021   No joint exam has been documented for this visit   There is currently no information documented on the homunculus. Go to the Rheumatology activity and complete the homunculus joint exam.  Investigation: No additional findings.  Imaging: No results found.  Recent Labs: Lab Results  Component Value Date   WBC 6.3 07/02/2020   HGB 15.0 07/02/2020   PLT 192 07/02/2020   NA 136 07/02/2020   K 3.0 (L) 07/02/2020   CL 101 07/02/2020   CO2 22 07/02/2020   GLUCOSE 157 (H) 07/02/2020   BUN 14 07/02/2020   CREATININE 0.52 07/02/2020   BILITOT 0.8 07/02/2020   ALKPHOS 88 07/02/2020   AST 34 07/02/2020   ALT 47 (H) 07/02/2020   PROT 7.7 07/02/2020   ALBUMIN 4.1 07/02/2020   CALCIUM 9.3 07/02/2020   GFRAA >60 07/02/2020     05/12/18: ANA-, RF<10, CCP<16, CRP 13.4, Sed rate 27, Vitamin D 33, Uric acid 5.0, Lyme - HLA B-27- Speciality Comments: No specialty comments available.  Procedures:  No procedures performed Allergies: Metrizamide, Other, Salicylic acid-sulfur, Bee pollen, Meloxicam, Compazine  [prochlorperazine], Contrast media [iodinated diagnostic agents], Doxycycline, Elemental sulfur, Gabapentin, Lyrica [pregabalin], Codeine, Hydroxychloroquine, Iodine, and Sulfa antibiotics   Assessment / Plan:     Visit Diagnoses: Primary osteoarthritis of both hands -patient had been  evaluated by me in the past.  Prior to that visit she had been seen by 2 different rheumatologist at [facility] clinic.  She has been under care of Dr. Dossie Der after she saw me in 2019.  She continues to have pain and discomfort in multiple joints.  She states Dr. Dossie Der did multiple x-rays and they were consistent with osteoarthritis.  She was advised to take anti-inflammatories.  She states all the medication she has tried so far had intolerance to or had side effects.  She lives in constant pain.  She had no synovitis on my examination.  05/12/18: ANA negative, RF<14, CCP ab<16  Impingement syndrome of shoulder region, left-she had injury to her left shoulder in the past.  She is on [employer]  She had a left shoulder joint abduction was limited to 90 degrees.  Chronic pain of both knees-she complains of pain and discomfort in the bilateral knee joints.  No warmth swelling effusion was noted.  I do not have x-rays available.  She declined getting x-rays.  She had x-rays at Dr. Audelia Hives office.  Primary osteoarthritis of both feet-clinical features of osteoarthritis were noted in her feet with high arches and hammertoes.  Proper fitting shoes with arch support were discussed.  DDD (degenerative disc disease), lumbar-she has been followed by Dr. Lacinda Axon for degenerative disc disease.  She states she gets frequent cortisone injections.  HLA B27 positive-I do not have x-rays available.  She gives history of being HLA-B27 positive at [facility]  She had extensive x-rays in the past which was not consistent with a spondyloarthropathy.  Fibromyalgia-she has generalized hyperalgesia and positive tender points which is consistent with fibromyalgia.  She is currently on Cymbalta and Flexeril.  She has tried gabapentin and Lyrica in the past and had intolerance to both medications.  She is also tried several NSAIDs in the past and had intolerance to it.  Chronic pain syndrome -she has been living in  pain for many years.   She states is difficult for her to handle pain.  She would like to see what other options will be to handle pain.  I will refer her to pain management.  Plan: Ambulatory referral to Physical Medicine Rehab  Benign hypertension-her blood pressure was elevated today.  Have advised her to monitor blood pressure closely and follow-up with her PCP.  Other medical problems are listed as follows:  Mild intermittent asthma without complication  Benign neoplasm of transverse colon  Gastric polyp  History of gastroesophageal reflux (GERD)  Right thyroid nodule  Major depression in remission ([place])  Mild hypercholesterolemia  Vitamin D deficiency disease  Orders: Orders Placed This Encounter  Procedures  . Ambulatory referral to Physical Medicine Rehab   No orders of the defined types were placed in this encounter.   Follow-Up Instructions: Return for Osteoarthritis.  I have nothing further to offer at this point.  Have advised her to return for follow-up as needed.   Bo Merino, MD  Note - This record has been created using Editor, commissioning.  Chart creation errors have been sought, but may not always  have been located. Such creation errors do not reflect on  the standard of medical care.

## 2021-01-28 ENCOUNTER — Encounter: Payer: Self-pay | Admitting: Rheumatology

## 2021-01-28 ENCOUNTER — Ambulatory Visit: Payer: BC Managed Care – PPO | Admitting: Rheumatology

## 2021-01-28 ENCOUNTER — Other Ambulatory Visit: Payer: Self-pay

## 2021-01-28 VITALS — BP 150/94 | HR 85 | Resp 17 | Ht 62.25 in | Wt 181.8 lb

## 2021-01-28 DIAGNOSIS — M19041 Primary osteoarthritis, right hand: Secondary | ICD-10-CM

## 2021-01-28 DIAGNOSIS — M19072 Primary osteoarthritis, left ankle and foot: Secondary | ICD-10-CM

## 2021-01-28 DIAGNOSIS — G894 Chronic pain syndrome: Secondary | ICD-10-CM

## 2021-01-28 DIAGNOSIS — F325 Major depressive disorder, single episode, in full remission: Secondary | ICD-10-CM

## 2021-01-28 DIAGNOSIS — M25562 Pain in left knee: Secondary | ICD-10-CM

## 2021-01-28 DIAGNOSIS — J4520 Mild intermittent asthma, uncomplicated: Secondary | ICD-10-CM

## 2021-01-28 DIAGNOSIS — M5136 Other intervertebral disc degeneration, lumbar region: Secondary | ICD-10-CM

## 2021-01-28 DIAGNOSIS — M25561 Pain in right knee: Secondary | ICD-10-CM | POA: Diagnosis not present

## 2021-01-28 DIAGNOSIS — M19042 Primary osteoarthritis, left hand: Secondary | ICD-10-CM

## 2021-01-28 DIAGNOSIS — Z8719 Personal history of other diseases of the digestive system: Secondary | ICD-10-CM

## 2021-01-28 DIAGNOSIS — E559 Vitamin D deficiency, unspecified: Secondary | ICD-10-CM

## 2021-01-28 DIAGNOSIS — E041 Nontoxic single thyroid nodule: Secondary | ICD-10-CM

## 2021-01-28 DIAGNOSIS — K317 Polyp of stomach and duodenum: Secondary | ICD-10-CM

## 2021-01-28 DIAGNOSIS — M7542 Impingement syndrome of left shoulder: Secondary | ICD-10-CM

## 2021-01-28 DIAGNOSIS — M19071 Primary osteoarthritis, right ankle and foot: Secondary | ICD-10-CM | POA: Diagnosis not present

## 2021-01-28 DIAGNOSIS — Z1589 Genetic susceptibility to other disease: Secondary | ICD-10-CM

## 2021-01-28 DIAGNOSIS — I10 Essential (primary) hypertension: Secondary | ICD-10-CM

## 2021-01-28 DIAGNOSIS — D123 Benign neoplasm of transverse colon: Secondary | ICD-10-CM

## 2021-01-28 DIAGNOSIS — R7982 Elevated C-reactive protein (CRP): Secondary | ICD-10-CM

## 2021-01-28 DIAGNOSIS — M797 Fibromyalgia: Secondary | ICD-10-CM

## 2021-01-28 DIAGNOSIS — G8929 Other chronic pain: Secondary | ICD-10-CM

## 2021-01-28 DIAGNOSIS — E7800 Pure hypercholesterolemia, unspecified: Secondary | ICD-10-CM

## 2021-01-28 DIAGNOSIS — M359 Systemic involvement of connective tissue, unspecified: Secondary | ICD-10-CM

## 2021-01-28 DIAGNOSIS — R7989 Other specified abnormal findings of blood chemistry: Secondary | ICD-10-CM

## 2021-02-03 ENCOUNTER — Ambulatory Visit: Payer: BC Managed Care – PPO | Admitting: Family Medicine

## 2021-02-05 ENCOUNTER — Encounter: Payer: Self-pay | Admitting: Physical Medicine and Rehabilitation

## 2021-02-20 ENCOUNTER — Encounter: Payer: Self-pay | Admitting: Physical Medicine and Rehabilitation

## 2021-02-20 ENCOUNTER — Encounter
Payer: BC Managed Care – PPO | Attending: Physical Medicine and Rehabilitation | Admitting: Physical Medicine and Rehabilitation

## 2021-02-20 ENCOUNTER — Other Ambulatory Visit: Payer: Self-pay

## 2021-02-20 VITALS — BP 143/83 | HR 94 | Temp 98.6°F | Ht 62.25 in | Wt 179.2 lb

## 2021-02-20 DIAGNOSIS — M797 Fibromyalgia: Secondary | ICD-10-CM | POA: Insufficient documentation

## 2021-02-20 DIAGNOSIS — M159 Polyosteoarthritis, unspecified: Secondary | ICD-10-CM

## 2021-02-20 DIAGNOSIS — M7918 Myalgia, other site: Secondary | ICD-10-CM | POA: Insufficient documentation

## 2021-02-20 DIAGNOSIS — M8949 Other hypertrophic osteoarthropathy, multiple sites: Secondary | ICD-10-CM | POA: Diagnosis present

## 2021-02-20 MED ORDER — LEVETIRACETAM 500 MG PO TABS: 500.0000 mg | ORAL_TABLET | Freq: Two times a day (BID) | ORAL | 5 refills | Status: DC

## 2021-02-20 NOTE — Progress Notes (Signed)
Subjective:    Patient ID: Yolanda Pace, female    DOB: 1966/07/23, 55 y.o.   MRN: [id number]  HPI  Patient is a 55 yr old R handed female with DM- A1c of 7.5;  with "all over" chronic pain dx of fibromyalgia, and DJD/OA in multiple joints, as well as chronic LBP with radiculopathy- saw NSU- who saw L4/5 has disc protrusion and :5-S1 small disc protrusion.  Here for evaluation of chronic pain.   L shoulder, Low back, and joints ache all the time, but numbness in L toes.   Has had bleeding from Sulindac, and Itching from Meloxicam.   2003- had bladder sling- had pain in LLE since day 1- was removed in 2019- L groin and LLE and vagina ever since was put in.  When had it removed, some "foregin object issues went away", but pain is worse.  Was told it was in wrong. Arms of it in obturator muscle.    Has poor memory- has to write things down.   - Is on Duloxetine 60 mg daily- for the last 2+ years. - was told has OA in multiple joints, inlcuding hands.   Tried: Lyrica- depression, tingling of mouth   Gabapentin-depression, tingling of lips Meloxicam- itching Never taken Keppra; Naltrexone    Social Hx Not working right now   Pain Inventory Average Pain 8 Pain Right Now 8 My pain is constant, burning, tingling and aching  In the last 24 hours, has pain interfered with the following? General activity 8 Relation with others 9 Enjoyment of life 10 What TIME of day is your pain at its worst? morning , daytime, evening, night and varies Sleep (in general) Poor  Pain is worse with: walking, bending, sitting and standing Pain improves with: rest, heat/ice and medication Relief from Meds: 5  walk without assistance how many minutes can you walk? 10 mins ability to climb steps?  yes do you drive?  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  yes x-rays Dr. Dossie Der   Any changes since last visit?  no New Patient    Family History  Problem Relation Age of Onset  . Hyperlipidemia Mother   . Diabetes Sister   . Hyperlipidemia Sister   . Hypertension Sister   . Hypertension Brother   . Hypertension Son   . Kidney Stones Son   . Pneumonia Maternal Grandmother   . Heart attack Maternal Grandfather   . Hypertension Sister   . Epilepsy Sister   . Hypertension Sister   . Hypertension Daughter   . Kidney Stones Daughter   . Cancer Neg Hx   . COPD Neg Hx   . Stroke Neg Hx    Social History   Socioeconomic History  . Marital status: Married    Spouse name: Not on file  . Number of children: 3  . Years of education: Not on file  . Highest education level: Some college, no degree  Occupational History  . Not on file  Tobacco Use  . Smoking status: Never Smoker  . Smokeless tobacco: Never Used  Vaping Use  . Vaping Use: Never used  Substance and Sexual Activity  . Alcohol use: Yes    Comment: occ  . Drug use: No  . Sexual activity: Yes  Other Topics Concern  . Not on  file  Social History Narrative  . Not on file   Social Determinants of Health   Financial Resource Strain: Not on file  Food Insecurity: Not on file  Transportation Needs: Not on file  Physical Activity: Not on file  Stress: Not on file  Social Connections: Not on file   Past Surgical History:  Procedure Laterality Date  . ABDOMINAL HYSTERECTOMY  2005ish   fibroids, ovaries remain; along with bladder tack  . bladder sling mesh removal  08/2018  . bladder tack  2005ish  . CHOLECYSTECTOMY    . COLONOSCOPY WITH PROPOFOL N/A 06/22/2018   Procedure: COLONOSCOPY WITH PROPOFOL;  Surgeon: Virgel Manifold, MD;  Location: [facility] END[facility]COPY;  Service: Endoscopy;  Laterality: N/A;  . CYSTO WITH HYDRODISTENSION N/A 05/02/2018   Procedure:  CYSTOSCOPY/HYDRODISTENSION;  Surgeon: Royston Cowper, MD;  Location: [facility] ORS;  Service: Urology;  Laterality: N/A;  . ESOPHAGOGASTRODUODENOSCOPY (EGD) WITH PROPOFOL N/A 06/22/2018   Procedure: ESOPHAGOGASTRODUODENOSCOPY (EGD) WITH PROPOFOL;  Surgeon: Virgel Manifold, MD;  Location: [facility] END[facility]COPY;  Service: Endoscopy;  Laterality: N/A;  . INCONTINENCE SURGERY    . ROTATOR CUFF REPAIR Left 01/08/2019  . rotator cuff revision Left 12/06/2019  . SKIN CANCER EXCISION  Aug 2015   removed from back   Past Medical History:  Diagnosis Date  . Allergic rhinitis   . Anxiety   . Arthritis   . Asthma    WELL CONTROLLED  . Basal cell carcinoma 05/31/2014   left spinal mid back  . Complication of anesthesia    PT STATES THAT SHE WAS GIVEN SOME TYPE OF ANESTHESIA THAT MADE HER HAVE A SEIZURE IN 2015-NOTE IS IN CHART FROM [place] ANESTHESIA FROM 2015  . Cystic thyroid nodule March 2015   12mm on u/s  . Depression   . Fibromyalgia   . GERD (gastroesophageal reflux disease)   . History of kidney stones    SMALL STONE CURRENTLY  . Hypertension   . Insomnia   . Joint pain 04/2018   PT SEEING RHEUMATOLOGIST FOR JOINT PAIN THAT OCCURS APP ONCE A MONTH AND IS SO BAD THAT SHE CANT GET OUT OF BED  . Obesity   . PONV (postoperative nausea and vomiting)   . Right thyroid nodule 05/26/2017   Solid 1 cm RIGHT lobe July 2018  . Seizures ([place]) June 2015   provoked during cervical nerve block  . Skin cancer Aug 2015   removed from back  . Squamous cell carcinoma of skin 08/11/2020   Right forearm, EDC  . Vitamin D deficiency disease    BP (!) 143/83   Pulse 94   Temp 98.6 F (37 C)   Ht 5' 2.25" (1.581 m)   Wt 179 lb 3.2 oz (81.3 kg)   SpO2 97%   BMI 32.51 kg/m   Opioid Risk Score:   Fall Risk Score:  `1  Depression screen PHQ 2/9  Depression screen [facility] 2/9 02/20/2021 10/31/2020 08/12/2020 07/04/2020 05/05/2020 11/05/2019 08/03/2019  Decreased Interest 3 1 0 0 0 0 0  Down, Depressed,  Hopeless 1 1 0 0 0 0 0  PHQ - 2 Score 4 2 0 0 0 0 0  Altered sleeping 3 1 - 0 2 0 0  Tired, decreased energy 2 1 - 0 2 0 0  Change in appetite 2 0 - 0 0 0 0  Feeling bad or failure about yourself  3 0 - 0 0 0 0  Trouble concentrating 3 0 - 0 0  0 0  Moving slowly or fidgety/restless 3 0 - 0 0 0 0  Suicidal thoughts 0 0 - 0 0 0 0  PHQ-9 Score 20 4 - 0 4 0 0  Difficult doing work/chores - Somewhat difficult - Not difficult at all Somewhat difficult - -  Some recent data might be hidden   Review of Systems  Constitutional: Positive for diaphoresis and unexpected weight change.       Weight gain  Respiratory: Positive for shortness of breath and wheezing.   Gastrointestinal: Positive for constipation and diarrhea.  Musculoskeletal: Positive for back pain, gait problem and neck pain.       Left side pain to include left hip & left foot Pain in all joints  All other systems reviewed and are negative.      Objective:   Physical Exam Awake, alert, appropriate, tearful at times, NAD Hard to get strength exam due to pain, however pt did try MS: UE's equal- at least 4-/5 in all muscles tested- biceps, triceps, WE grip and finger abd B/L LEs: 5-/5 to 5/5 B/L in LE's HF, KE, KF DF and PF B/L Very tight with trigger points in pecs, upper traps, levators, scalenes, splenius capitus, and rhomboids, as well as lumbar paraspinals.   Neuro: Decreased sensation to light touch on LLE L4-S1 on L No hoffman's no clonus; no increased tone      Assessment & Plan:    Patient is a 55 yr old R handed female with DM- A1c of 7.5;  with "all over" chronic pain dx of fibromyalgia, and DJD/OA in multiple joints, as well as chronic LBP with radiculopathy- saw NSU- who saw L4/5 has disc protrusion and :5-S1 small disc protrusion.  Here for evaluation of chronic pain due to  fibromyalgia, myofascial pain syndrome and DJD.     1. Goes Monday for Physical- Dr Ancil Boozer- I suggest a TSH. Free T4 and free T3; and  Vit D level (takes 2000 units/day of Vit D).   2. Discussed Low dose Naltrexone vs Keppra-  Went over side effects vs benefits of both  3. Keppra/Levicetracem- start  250 mg (1/2 tab) 2x/day x 1 week, then 500 mg 2x/day x 1 week, then 1000 mg 2x/day- for nerve pain. Can be broken if need be.   4. Suggest Neuropathy could be worse from elevated BGs.  It's not the cause, but can make things worse.   5. Call me in 3-4 weeks, to let me know how meds are working.   6. Will get pt back in for trigger point injections.   1.  ,Dr Sharion Balloon- The survivor's Handbook to Fibromyalgia and Myofascial Pain Syndrome 2. Theracane- 2-4 minutes on each trigger point- hold firm pressure, don't massage; can get for $20-30 online- will never need replacing- youtube some videos to use it.  3. Tennis balls- 2-5 minutes on each trigger point- buttocks, back of thighs, calves, low and mid back- can throw in dryer x1 to make softer. 10 minutes on buttocks.  4. Magnesium 400 mg 2-3x/day for muscle tightness- titrate up until loose stools- over the counter.  5. Lidocaine patches- 2-3 patches 12 hrs on;12 hrs off- areas of most pain- never on bottom of feet- they are over the counter.  6. Rolling pin- can use on calves, thighs and arms, and buttocks- roll slowly over muscles firmly- roll towards heart 7. F/U in 1 month for trigger point injections and f/u on pain.   I spent a total of  45 minutes on visit- as detailed above- going over plan as discussed above.

## 2021-02-20 NOTE — Patient Instructions (Signed)
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  Here for evaluation of chronic pain due to  fibromyalgia, myofascial pain syndrome and DJD.     1. Goes Monday for Physical- Dr Ancil Boozer- I suggest a TSH. Free T4 and free T3; and Vit D level (takes 2000 units/day of Vit D).   2. Discussed Low dose Naltrexone vs Keppra-  Went over side effects vs benefits of both  3. Keppra/Levicetracem- start  250 mg (1/2 tab) 2x/day x 1 week, then 500 mg 2x/day x 1 week, then 1000 mg 2x/day- for nerve pain. Can be broken if need be.   4. Suggest Neuropathy could be worse from elevated BGs.  It's not the cause, but can make things worse.   5. Call me in 3-4 weeks, to let me know how meds are working.   6. Will get pt back in for trigger point injections.   1.  ,Dr Sharion Balloon- The survivor's Handbook to Fibromyalgia and Myofascial Pain Syndrome 2. Theracane- 2-4 minutes on each trigger point- hold firm pressure, don't massage; can get for $20-30 online- will never need replacing- youtube some videos to use it.  3. Tennis balls- 2-5 minutes on each trigger point- buttocks, back of thighs, calves, low and mid back- can throw in dryer x1 to make softer. 10 minutes on buttocks.  4. Magnesium 400 mg 2-3x/day for muscle tightness- titrate up until loose stools- over the counter.  5. Lidocaine patches- 2-3 patches 12 hrs on;12 hrs off- areas of most pain- never on bottom of feet- they are over the counter.  6. Rolling pin- can use on calves, thighs and arms, and buttocks- roll slowly over muscles firmly- roll towards heart 7. F/U in 1 month for trigger point injections and f/u on pain.

## 2021-02-23 ENCOUNTER — Other Ambulatory Visit: Payer: Self-pay

## 2021-02-23 ENCOUNTER — Telehealth: Payer: Self-pay

## 2021-02-23 ENCOUNTER — Encounter: Payer: Self-pay | Admitting: Family Medicine

## 2021-02-23 ENCOUNTER — Ambulatory Visit: Payer: BC Managed Care – PPO | Admitting: Family Medicine

## 2021-02-23 VITALS — BP 126/84 | HR 93 | Temp 98.6°F | Resp 16 | Ht 62.0 in | Wt 179.0 lb

## 2021-02-23 DIAGNOSIS — I10 Essential (primary) hypertension: Secondary | ICD-10-CM

## 2021-02-23 DIAGNOSIS — E6609 Other obesity due to excess calories: Secondary | ICD-10-CM

## 2021-02-23 DIAGNOSIS — G894 Chronic pain syndrome: Secondary | ICD-10-CM | POA: Diagnosis not present

## 2021-02-23 DIAGNOSIS — E785 Hyperlipidemia, unspecified: Secondary | ICD-10-CM

## 2021-02-23 DIAGNOSIS — F339 Major depressive disorder, recurrent, unspecified: Secondary | ICD-10-CM

## 2021-02-23 DIAGNOSIS — E1129 Type 2 diabetes mellitus with other diabetic kidney complication: Secondary | ICD-10-CM

## 2021-02-23 DIAGNOSIS — K219 Gastro-esophageal reflux disease without esophagitis: Secondary | ICD-10-CM | POA: Diagnosis not present

## 2021-02-23 DIAGNOSIS — M797 Fibromyalgia: Secondary | ICD-10-CM | POA: Diagnosis not present

## 2021-02-23 DIAGNOSIS — Z0000 Encounter for general adult medical examination without abnormal findings: Secondary | ICD-10-CM

## 2021-02-23 DIAGNOSIS — R5383 Other fatigue: Secondary | ICD-10-CM

## 2021-02-23 DIAGNOSIS — J4520 Mild intermittent asthma, uncomplicated: Secondary | ICD-10-CM

## 2021-02-23 DIAGNOSIS — E1169 Type 2 diabetes mellitus with other specified complication: Secondary | ICD-10-CM

## 2021-02-23 DIAGNOSIS — R809 Proteinuria, unspecified: Secondary | ICD-10-CM

## 2021-02-23 DIAGNOSIS — Z6833 Body mass index (BMI) 33.0-33.9, adult: Secondary | ICD-10-CM

## 2021-02-23 DIAGNOSIS — E559 Vitamin D deficiency, unspecified: Secondary | ICD-10-CM

## 2021-02-23 DIAGNOSIS — Z1211 Encounter for screening for malignant neoplasm of colon: Secondary | ICD-10-CM

## 2021-02-23 DIAGNOSIS — M359 Systemic involvement of connective tissue, unspecified: Secondary | ICD-10-CM

## 2021-02-23 MED ORDER — OZEMPIC (0.25 OR 0.5 MG/DOSE) 2 MG/1.5ML ~~LOC~~ SOPN: 0.2500 mg | PEN_INJECTOR | SUBCUTANEOUS | 0 refills | Status: DC

## 2021-02-23 MED ORDER — OLMESARTAN MEDOXOMIL 40 MG PO TABS: 40.0000 mg | ORAL_TABLET | Freq: Every day | ORAL | 1 refills | Status: DC

## 2021-02-23 MED ORDER — ROSUVASTATIN CALCIUM 5 MG PO TABS: 5.0000 mg | ORAL_TABLET | Freq: Every day | ORAL | 1 refills | Status: DC

## 2021-02-23 MED ORDER — BLOOD GLUCOSE METER KIT: PACK | 0 refills | Status: AC

## 2021-02-23 MED ORDER — CYCLOBENZAPRINE HCL 10 MG PO TABS: 1.0000 | ORAL_TABLET | Freq: Every day | ORAL | 0 refills | Status: DC | PRN

## 2021-02-23 NOTE — Telephone Encounter (Signed)
Copied from [place] [id number]. Topic: Quick Communication - Rx Refill/Question >> Feb 23, 2021  3:03 PM Erick Blinks wrote: Ask for Webb Silversmith, from the pharmacy  Pharmacy needs more directions for Semaglutide,0.25 or 0.5MG /DOS, (OZEMPIC, 0.25 OR 0.5 MG/DOSE,) 2 MG/1.5ML SOPN

## 2021-02-23 NOTE — Patient Instructions (Signed)
Preventive Care 55-55 Years Old, Female Preventive care refers to lifestyle choices and visits with your health care provider that can promote health and wellness. This includes:  A yearly physical exam. This is also called an annual wellness visit.  Regular dental and eye exams.  Immunizations.  Screening for certain conditions.  Healthy lifestyle choices, such as: ? Eating a healthy diet. ? Getting regular exercise. ? Not using drugs or products that contain nicotine and tobacco. ? Limiting alcohol use. What can I expect for my preventive care visit? Physical exam Your health care provider will check your:  Height and weight. These may be used to calculate your BMI (body mass index). BMI is a measurement that tells if you are at a healthy weight.  Heart rate and blood pressure.  Body temperature.  Skin for abnormal spots. Counseling Your health care provider may ask you questions about your:  Past medical problems.  Family's medical history.  Alcohol, tobacco, and drug use.  Emotional well-being.  Home life and relationship well-being.  Sexual activity.  Diet, exercise, and sleep habits.  Work and work Statistician.  Access to firearms.  Method of birth control.  Menstrual cycle.  Pregnancy history. What immunizations do I need? Vaccines are usually given at various ages, according to a schedule. Your health care provider will recommend vaccines for you based on your age, medical history, and lifestyle or other factors, such as travel or where you work.   What tests do I need? Blood tests  Lipid and cholesterol levels. These may be checked every 5 years, or more often if you are over 55 years old.  Hepatitis C test.  Hepatitis B test. Screening  Lung cancer screening. You may have this screening every year starting at age 55 if you have a 30-pack-year history of smoking and currently smoke or have quit within the past 15 years.  Colorectal cancer  screening. ? All adults should have this screening starting at age 55 and continuing until age 17. ? Your health care provider may recommend screening at age 55 if you are at increased risk. ? You will have tests every 1-10 years, depending on your results and the type of screening test.  Diabetes screening. ? This is done by checking your blood sugar (glucose) after you have not eaten for a while (fasting). ? You may have this done every 1-3 years.  Mammogram. ? This may be done every 1-2 years. ? Talk with your health care provider about when you should start having regular mammograms. This may depend on whether you have a family history of breast cancer.  BRCA-related cancer screening. This may be done if you have a family history of breast, ovarian, tubal, or peritoneal cancers.  Pelvic exam and Pap test. ? This may be done every 3 years starting at age 55. ? Starting at age 11, this may be done every 5 years if you have a Pap test in combination with an HPV test. Other tests  STD (sexually transmitted disease) testing, if you are at risk.  Bone density scan. This is done to screen for osteoporosis. You may have this scan if you are at high risk for osteoporosis. Talk with your health care provider about your test results, treatment options, and if necessary, the need for more tests. Follow these instructions at home: Eating and drinking  Eat a diet that includes fresh fruits and vegetables, whole grains, lean protein, and low-fat dairy products.  Take vitamin and mineral supplements  as recommended by your health care provider.  Do not drink alcohol if: ? Your health care provider tells you not to drink. ? You are pregnant, may be pregnant, or are planning to become pregnant.  If you drink alcohol: ? Limit how much you have to 0-1 drink a day. ? Be aware of how much alcohol is in your drink. In the [place] one drink equals one 12 oz bottle of beer (355 mL), one 5 oz glass of  wine (148 mL), or one 1 oz glass of hard liquor (44 mL).   Lifestyle  Take daily care of your teeth and gums. Brush your teeth every morning and night with fluoride toothpaste. Floss one time each day.  Stay active. Exercise for at least 30 minutes 5 or more days each week.  Do not use any products that contain nicotine or tobacco, such as cigarettes, e-cigarettes, and chewing tobacco. If you need help quitting, ask your health care provider.  Do not use drugs.  If you are sexually active, practice safe sex. Use a condom or other form of protection to prevent STIs (sexually transmitted infections).  If you do not wish to become pregnant, use a form of birth control. If you plan to become pregnant, see your health care provider for a prepregnancy visit.  If told by your health care provider, take low-dose aspirin daily starting at age 50.  Find healthy ways to cope with stress, such as: ? Meditation, yoga, or listening to music. ? Journaling. ? Talking to a trusted person. ? Spending time with friends and family. Safety  Always wear your seat belt while driving or riding in a vehicle.  Do not drive: ? If you have been drinking alcohol. Do not ride with someone who has been drinking. ? When you are tired or distracted. ? While texting.  Wear a helmet and other protective equipment during sports activities.  If you have firearms in your house, make sure you follow all gun safety procedures. What's next?  Visit your health care provider once a year for an annual wellness visit.  Ask your health care provider how often you should have your eyes and teeth checked.  Stay up to date on all vaccines. This information is not intended to replace advice given to you by your health care provider. Make sure you discuss any questions you have with your health care provider. Document Revised: 08/05/2020 Document Reviewed: 07/13/2018 [employer] Patient Education  2021 [employer]  

## 2021-02-23 NOTE — Progress Notes (Signed)
Name: Yolanda Pace   MRN: [id number]    DOB: [birth year]   Date:02/23/2021       Progress Note  Subjective  Chief Complaint  Annual Exam   HPI  Patient presents for annual CPE and follow up - she is aware may have additional cost   FMS/elevated CRP/Connective Tissue Disease :she was seen by Dr. Jefferson Fuel in the past and we mistakenly sent her to see Dr Kirby Funk of 2020. She states x-rays were done and also labs , inflammatory markers improved and she was given reassurance that is it OA and to continue to monitor. Takes Duloxetine , takes Flexeril prn only at night. She was recent to PMR, Dr. Dagoberto Ligas and started on Keppra.   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 She states since she has been more depressed she has not been checking her glucose and has not been as restrictive with her diet lately. Urine micro was positive in the past. She is taking an ARB, she is willing to try statin therapy.   Depression moderate  doing was doing well  Duloxetine, she states over the past few weeks not feeling well " What is the point?  My health is so bad. It is so costly to feel better and going to the doctor"   She was diagnosed at age 64 - sudden onset after hysterectomy/bladder sling. She states took years to get back to normal. She was on zoloft for many years, switched to duloxetine because of FMS and joint problems . She has suicidal thoughts but no planning. She states she is looking for a therapist. Discussed mindfulness   GERD: controlled with medication, denies heartburn or indigestionUnchanged   Mild intermittent asthma: seeing Dr. Humphrey Rolls and is off steroid inhaler, uses rescue and also on singulair   Chronic back pain with radiculitis: under the care of Dr. Phyllis Ginger , she states symptoms started suddenly Feb 2021, she had MRI . She had multiple steroid injections, and was referred to neurosurgeon to Dr.  Lacinda Axon. . She is going for more evaluation since the injections stopped helping. Pain is constant on her lower back, pain is higher when standing for walking for over 10 minutes. Pain shoots down the left leg and left toes are numb. No bowel or bladder incontinence.   Diet: balanced but has been splurging lately  Exercise: not enough secondary to pain   [facility] Office Visit from 10/31/2020 in [facility]  AUDIT-C Score 0     Depression: Phq 9 is  positive Depression screen [facility] 2/9 02/23/2021 02/20/2021 10/31/2020 08/12/2020 07/04/2020  Decreased Interest _0 0 0  Down, Depressed, Hopeless _1 0 0  PHQ - 2 Score _2 0 0  Altered sleeping _3 - 0  Tired, decreased energy _4 - 0  Change in appetite 0 2 0 - 0  Feeling bad or failure about yourself  1 3 0 - 0  Trouble concentrating 3 3 0 - 0  Moving slowly or fidgety/restless 0 3 0 - 0  Suicidal thoughts 0 0 0 - 0  PHQ-9 Score _5 - 0  Difficult doing work/chores - - Somewhat difficult - Not difficult at all  Some recent data might be hidden   Hypertension: BP Readings from Last 3 Encounters:  02/23/21 126/84  02/20/21 (!) 143/83  01/28/21 (!) 150/94   Obesity: Wt Readings from Last 3 Encounters:  02/23/21 179 lb (81.2 kg)  02/20/21 179 lb 3.2 oz (81.3 kg)  01/28/21 181 lb 12.8 oz (82.5 kg)   BMI Readings from Last 3 Encounters:  02/23/21 32.74 kg/m  02/20/21 32.51 kg/m  01/28/21 32.99 kg/m     Vaccines:   Shingrix: 34-64 yo and ask insurance if covered when patient above 18 yo - she wants to think about it  Pneumonia: up to date  Flu:she has not had it recently   Hep C Screening: 05/05/2020 STD testing and prevention (HIV/chl/gon/syphilis): 05/05/2020 Intimate partner violence: negative screen  Sexual History : not sexually active secondary to body aches, also has pain with intercourse  Menstrual History/LMP/Abnormal Bleeding: s/p hysterectomy  Incontinence Symptoms: she had  bladder sling, but removed because it was causing pain, she has some stress incontinence   Breast cancer:  - Last Mammogram: 05/08/2020 - BRCA gene screening: N/A  Osteoporosis: Discussed high calcium and vitamin D supplementation, weight bearing exercises  Cervical cancer screening: N/A - s/p hysterectomy  Skin cancer: Discussed monitoring for atypical lesions - up to date with dermatologist - [facility]  Colorectal cancer: 06/22/2018  Lung cancer:  Low Dose CT Chest recommended if Age 64-80 years, 20 pack-year currently smoking OR have quit w/in 15years. Patient does not qualify.   ECG: 07/02/2020  Advanced Care Planning: A voluntary discussion about advance care planning including the explanation and discussion of advance directives.  Discussed health care proxy and Living will, and the patient was able to identify a health care proxy as husband   Lipids: Lab Results  Component Value Date   CHOL 203 (H) 02/06/2018   CHOL 203 (H) 02/06/2018   CHOL 215 (H) 11/09/2017   Lab Results  Component Value Date   HDL 48 (L) 02/06/2018   HDL 48 (L) 02/06/2018   HDL 47 (L) 11/09/2017   Lab Results  Component Value Date   LDLCALC 128 (H) 02/06/2018   LDLCALC 128 (H) 02/06/2018   LDLCALC 142 (H) 11/09/2017   Lab Results  Component Value Date   TRIG 154 (H) 02/06/2018   TRIG 154 (H) 02/06/2018   TRIG 132 11/09/2017   Lab Results  Component Value Date   CHOLHDL 4.2 02/06/2018   CHOLHDL 4.2 02/06/2018   CHOLHDL 4.6 11/09/2017   No results found for: LDLDIRECT  Glucose: Glucose, Bld  Date Value Ref Range Status  07/02/2020 157 (H) 70 - 99 mg/dL Final    Comment:    Glucose reference range applies only to samples taken after fasting for at least 8 hours.  05/05/2020 149 (H) 65 - 99 mg/dL Final    Comment:    .            Fasting reference interval . For someone without known diabetes, a glucose value >125 mg/dL indicates that they may have diabetes and this should be  confirmed with a follow-up test. .   05/12/2018 93 65 - 139 mg/dL Final    Comment:    .        Non-fasting reference interval .     Patient Active Problem List   Diagnosis Date Noted  . 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 Fibromyalgia 02/20/2021  . Myofascial pain dysfunction syndrome 02/20/2021  . Osteoarthritis 10/31/2020  . Connective tissue disease ([place]) 10/31/2020  . Chronic shoulder pain 07/07/2020  . Mild intermittent asthma without complication 99/37/1696  .  Non-intractable vomiting with nausea 07/07/2020  . Traumatic incomplete tear of left rotator cuff 04/23/2020  . Arthralgia 08/03/2019  . Gastric polyp   . Benign neoplasm of transverse colon   . Internal hemorrhoids   . Malignant neoplasm of skin 06/19/2018  . Chronic pain syndrome 06/19/2018  . 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 Kidney stone 02/20/2018  . Renal lesion 02/20/2018  . Benign hypertension 01/21/2018  . Pelvic pain 12/26/2017  . Major depression in remission ([place]) 12/26/2017  . GERD (gastroesophageal reflux disease) 11/09/2017  . 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 Elevated C-reactive protein (CRP) 07/11/2017  . Right thyroid nodule 05/26/2017  . Mild hypercholesterolemia 05/06/2017  . Generalized osteoarthritis of hand 12/13/2016  . Impingement syndrome of shoulder region, left 11/29/2016  . Anxiety   . Allergic rhinitis   . Insomnia   . Obesity   . Vitamin D deficiency disease     Past Surgical History:  Procedure Laterality Date  . ABDOMINAL HYSTERECTOMY  2005ish   fibroids, ovaries remain; along with bladder tack  . bladder sling mesh removal  08/2018  . bladder tack  2005ish  . CHOLECYSTECTOMY    . COLONOSCOPY WITH PROPOFOL N/A 06/22/2018   Procedure: COLONOSCOPY WITH PROPOFOL;  Surgeon: Virgel Manifold, MD;  Location: [facility] ENDOSCOPY;  Service: Endoscopy;  Laterality: N/A;  . CYSTO WITH HYDRODISTENSION N/A  05/02/2018   Procedure: CYSTOSCOPY/HYDRODISTENSION;  Surgeon: Royston Cowper, MD;  Location: [facility] ORS;  Service: Urology;  Laterality: N/A;  . ESOPHAGOGASTRODUODENOSCOPY (EGD) WITH PROPOFOL N/A 06/22/2018   Procedure: ESOPHAGOGASTRODUODENOSCOPY (EGD) WITH PROPOFOL;  Surgeon: Virgel Manifold, MD;  Location: [facility] ENDOSCOPY;  Service: Endoscopy;  Laterality: N/A;  . INCONTINENCE SURGERY    . ROTATOR CUFF REPAIR Left 01/08/2019  . rotator cuff revision Left 12/06/2019  . SKIN CANCER EXCISION  Aug 2015   removed from back    Family History  Problem Relation Age of Onset  . Hyperlipidemia Mother   . Diabetes Sister   . Hyperlipidemia Sister   . Hypertension Sister   . Hypertension Brother   . Hypertension Son   . Kidney Stones Son   . Pneumonia Maternal Grandmother   . Heart attack Maternal Grandfather   . Hypertension Sister   . Epilepsy Sister   . Hypertension Sister   . Hypertension Daughter   . Kidney Stones Daughter   . Cancer Neg Hx   . COPD Neg Hx   . Stroke Neg Hx     Social History   Socioeconomic History  . Marital status: Married    Spouse name: Not on file  . Number of children: 3  . Years of education: Not on file  . Highest education level: Some college, no degree  Occupational History  . Not on file  Tobacco Use  . Smoking status: Never Smoker  . Smokeless tobacco: Never Used  Vaping Use  . Vaping Use: Never used  Substance and Sexual Activity  . Alcohol use: Yes    Comment: occ  . Drug use: No  . Sexual activity: Yes  Other Topics Concern  . Not on file  Social History Narrative  . Not on file   Social Determinants of Health   Financial Resource Strain: Low Risk   . Difficulty of Paying Living Expenses: Not hard at all  Food Insecurity: No Food Insecurity  . Worried About Charity fundraiser in the Last Year: Never true  . Ran Out of Food in the  Last Year: Never true  Transportation Needs: No Transportation Needs  . Lack of Transportation  (Medical): No  . Lack of Transportation (Non-Medical): No  Physical Activity: Inactive  . Days of Exercise per Week: 0 days  . Minutes of Exercise per Session: 0 min  Stress: Stress Concern Present  . Feeling of Stress : To some extent  Social Connections: Moderately Integrated  . Frequency of Communication with Friends and Family: More than three times a week  . Frequency of Social Gatherings with Friends and Family: Once a week  . Attends Religious Services: More than 4 times per year  . Active Member of Clubs or Organizations: No  . Attends Archivist Meetings: Never  . Marital Status: Married  Human resources officer Violence: Not At Risk  . Fear of Current or Ex-Partner: No  . Emotionally Abused: No  . Physically Abused: No  . Sexually Abused: No     Current Outpatient Medications:  .  acetaminophen (TYLENOL) 500 MG tablet, Take by mouth as needed., Disp: , Rfl:  .  albuterol (PROVENTIL) (2.5 MG/3ML) 0.083% nebulizer solution, Take 3 mLs (2.5 mg total) by nebulization every 4 (four) hours as needed for wheezing or shortness of breath., Disp: 75 mL, Rfl: 1 .  albuterol (VENTOLIN HFA) 108 (90 Base) MCG/ACT inhaler, Inhale 2 puffs into the lungs 4 (four) times daily as needed., Disp: , Rfl:  .  blood glucose meter kit and supplies, Dispense based on patient and insurance preference. Use up to four times daily as directed. (FOR ICD-10 E10.9, E11.9)., Disp: 1 each, Rfl: 0 .  Cholecalciferol (VITAMIN D) 2000 units tablet, Take 2,000 Units by mouth daily. , Disp: , Rfl:  .  DULoxetine (CYMBALTA) 60 MG capsule, Take 1 capsule (60 mg total) by mouth daily., Disp: 90 capsule, Rfl: 1 .  ibuprofen (ADVIL,MOTRIN) 600 MG tablet, Take 600 mg by mouth daily with breakfast., Disp: , Rfl:  .  levETIRAcetam (KEPPRA) 500 MG tablet, Take 1 tablet (500 mg total) by mouth 2 (two) times daily. X 1 week, then 1000 mg 2x/day, for nerve pain, Disp: 120 tablet, Rfl: 5 .  levocetirizine (XYZAL) 5 MG  tablet, Take 1 tablet (5 mg total) by mouth every evening., Disp: 90 tablet, Rfl: 1 .  montelukast (SINGULAIR) 10 MG tablet, Take 1 tablet (10 mg total) by mouth at bedtime., Disp: 90 tablet, Rfl: 1 .  omeprazole (PRILOSEC) 20 MG capsule, Take 20 mg by mouth every morning. , Disp: , Rfl:  .  rosuvastatin (CRESTOR) 5 MG tablet, Take 1 tablet (5 mg total) by mouth daily., Disp: 90 tablet, Rfl: 1 .  Semaglutide,0.25 or 0.5MG/DOS, (OZEMPIC, 0.25 OR 0.5 MG/DOSE,) 2 MG/1.5ML SOPN, Inject 0.25-0.5 mg into the skin once a week., Disp: 45 mL, Rfl: 0 .  cyclobenzaprine (FLEXERIL) 10 MG tablet, Take 1 tablet (10 mg total) by mouth daily as needed., Disp: 90 tablet, Rfl: 0 .  olmesartan (BENICAR) 40 MG tablet, Take 1 tablet (40 mg total) by mouth daily. In place of norvasc, Disp: 90 tablet, Rfl: 1  Allergies  Allergen Reactions  . Metrizamide Swelling    Noted in [facility]. chart on 01/06/2012. Premedicate patient prior to contrast media injections.  . Other     [place]: vomiting   . Salicylic Acid-Sulfur     Burst eye vessels  . Bee Pollen     Sneezing, watery eyes, coughing.   . Meloxicam Itching  . Compazine  [Prochlorperazine] Other (See Comments)  Jaws locked up  . Contrast Media [Iodinated Diagnostic Agents] Swelling    Noted in [facility] on 01/06/2012. Premedicate patient prior to contrast media injections.  . Doxycycline Nausea Only    GI upset  . Elemental Sulfur Other (See Comments)    Made all blood vessels in eyes burst  . Gabapentin Nausea And Vomiting    Lip numbness   . Lyrica [Pregabalin]     Tingling, depression, blister mouth   . Codeine Nausea And Vomiting  . Hydroxychloroquine Nausea And Vomiting and Nausea Only    Ear clicking  . Iodine Swelling  . Sulfa Antibiotics Nausea And Vomiting and Other (See Comments)     ROS  Constitutional: Negative for fever or weight change.  Respiratory: Negative for cough and shortness of breath.   Cardiovascular: Negative  for chest pain or palpitations.  Gastrointestinal: Negative for abdominal pain, no bowel changes.  Musculoskeletal: Negative for gait problem or joint swelling.  Skin: Negative for rash.  Neurological: Negative for dizziness or headache.  No other specific complaints in a complete review of systems (except as listed in HPI above).  Objective  Vitals:   02/23/21 1411  BP: 126/84  Pulse: 93  Resp: 16  Temp: 98.6 F (37 C)  TempSrc: Oral  SpO2: 95%  Weight: 179 lb (81.2 kg)  Height: _0  (1.575 m)    Body mass index is 32.74 kg/m.  Physical Exam  Constitutional: Patient appears well-developed and well-nourished. No distress.  HENT: Head: Normocephalic and atraumatic. Ears: B TMs ok, no erythema or effusion; Nose: Not done  Mouth/Throat: not done  Eyes: Conjunctivae and EOM are normal. Pupils are equal, round, and reactive to light. No scleral icterus.  Neck: Normal range of motion. Neck supple. No JVD present. No thyromegaly present.  Cardiovascular: Normal rate, regular rhythm and normal heart sounds.  No murmur heard. No BLE edema. Pulmonary/Chest: Effort normal and breath sounds normal. No respiratory distress. Abdominal: Soft. Bowel sounds are normal, no distension. There is no tenderness. no masses Breast: no lumps or masses, no nipple discharge or rashes FEMALE GENITALIA:  Sees GYN  RECTAL: not done  Musculoskeletal: Normal range of motion, no joint effusions. No gross deformities Neurological: he is alert and oriented to person, place, and time. No cranial nerve deficit. Coordination, balance, strength, speech and gait are normal.  Skin: Skin is warm and dry. No rash noted. No erythema.  Psychiatric: Patient has a normal mood and affect. behavior is normal. Judgment and thought content normal.  Diabetic Foot Exam: Diabetic Foot Exam - Simple   Simple Foot Form Diabetic Foot exam was performed with the following findings: Yes 02/23/2021  3:10 PM  Visual  Inspection No deformities, no ulcerations, no other skin breakdown bilaterally: Yes Sensation Testing Intact to touch and monofilament testing bilaterally: Yes Pulse Check Posterior Tibialis and Dorsalis pulse intact bilaterally: Yes Comments     Fall Risk: Fall Risk  02/23/2021 02/20/2021 10/31/2020 08/12/2020 07/04/2020  Falls in the past year? 0 0 0 0 0  Number falls in past yr: 0 0 0 - 0  Injury with Fall? 0 0 0 - 0  Follow up - - - - Falls evaluation completed     Functional Status Survey: Is the patient deaf or have difficulty hearing?: No Does the patient have difficulty seeing, even when wearing glasses/contacts?: No Does the patient have difficulty concentrating, remembering, or making decisions?: Yes Does the patient have difficulty walking or climbing stairs?: Yes Does  the patient have difficulty dressing or bathing?: No Does the patient have difficulty doing errands alone such as visiting a doctor's office or shopping?: No   Assessment & Plan  1. Well adult exam  - VITAMIN D 25 Hydroxy (Vit-D Deficiency, Fractures) - T3, free - TSH + free T4 - Lipid panel - Microalbumin / creatinine urine ratio - CBC with Differential/Platelet - COMPLETE METABOLIC PANEL WITH GFR - Hemoglobin A1c - Sedimentation rate - C-reactive protein  2. Benign hypertension  - CBC with Differential/Platelet - COMPLETE METABOLIC PANEL WITH GFR - olmesartan (BENICAR) 40 MG tablet; Take 1 tablet (40 mg total) by mouth daily. In place of norvasc  Dispense: 90 tablet; Refill: 1  3. Gastroesophageal reflux disease without esophagitis   4. Chronic pain syndrome   5. Fibromyalgia  - cyclobenzaprine (FLEXERIL) 10 MG tablet; Take 1 tablet (10 mg total) by mouth daily as needed.  Dispense: 90 tablet; Refill: 0  6. Vitamin D deficiency disease  - VITAMIN D 25 Hydroxy (Vit-D Deficiency, Fractures)  7. Connective tissue disease ([place])  - Sedimentation rate - C-reactive protein  8.  Asthma, mild intermittent, well-controlled   9. Diabetes mellitus with microalbuminuria (HCC)  - Lipid panel - Microalbumin / creatinine urine ratio - Hemoglobin A1c - Semaglutide,0.25 or 0.5MG/DOS, (OZEMPIC, 0.25 OR 0.5 MG/DOSE,) 2 MG/1.5ML SOPN; Inject 0.25-0.5 mg into the skin once a week.  Dispense: 45 mL; Refill: 0 - blood glucose meter kit and supplies; Dispense based on patient and insurance preference. Use up to four times daily as directed. (FOR ICD-10 E10.9, E11.9).  Dispense: 1 each; Refill: 0  10. Class 1 obesity due to excess calories without serious comorbidity with body mass index (BMI) of 33.0 to 33.9 in adult  - T3, free - TSH + free T4  11. Major depression, recurrent, chronic ([place])   12. Colon cancer screening  - Ambulatory referral to Gastroenterology  13. Other fatigue  - T3, free - TSH + free T4  14. Dyslipidemia associated with type 2 diabetes mellitus (HCC)  - rosuvastatin (CRESTOR) 5 MG tablet; Take 1 tablet (5 mg total) by mouth daily.  Dispense: 90 tablet; Refill: 1   -USPSTF grade A and B recommendations reviewed with patient; age-appropriate recommendations, preventive care, screening tests, etc discussed and encouraged; healthy living encouraged; see AVS for patient education given to patient -Discussed importance of 150 minutes of physical activity weekly, eat two servings of fish weekly, eat one serving of tree nuts ( cashews, pistachios, pecans, almonds.Marland Kitchen) every other day, eat 6 servings of fruit/vegetables daily and drink plenty of water and avoid sweet beverages.

## 2021-02-24 ENCOUNTER — Encounter: Payer: Self-pay | Admitting: Family Medicine

## 2021-02-24 LAB — CBC WITH DIFFERENTIAL/PLATELET
Absolute Monocytes: 554 cells/uL (ref 200–950)
Basophils Absolute: 69 cells/uL (ref 0–200)
Basophils Relative: 0.9 %
Eosinophils Absolute: 231 cells/uL (ref 15–500)
Eosinophils Relative: 3 %
HCT: 43.1 % (ref 35.0–45.0)
Hemoglobin: 14 g/dL (ref 11.7–15.5)
Lymphs Abs: 2680 cells/uL (ref 850–3900)
MCH: 28.5 pg (ref 27.0–33.0)
MCHC: 32.5 g/dL (ref 32.0–36.0)
MCV: 87.8 fL (ref 80.0–100.0)
MPV: 9.4 fL (ref 7.5–12.5)
Monocytes Relative: 7.2 %
Neutro Abs: 4166 cells/uL (ref 1500–7800)
Neutrophils Relative %: 54.1 %
Platelets: 341 10*3/uL (ref 140–400)
RBC: 4.91 10*6/uL (ref 3.80–5.10)
RDW: 11.8 % (ref 11.0–15.0)
Total Lymphocyte: 34.8 %
WBC: 7.7 10*3/uL (ref 3.8–10.8)

## 2021-02-24 LAB — COMPLETE METABOLIC PANEL WITH GFR
AG Ratio: 2 (calc) (ref 1.0–2.5)
ALT: 17 U/L (ref 6–29)
AST: 13 U/L (ref 10–35)
Albumin: 4.4 g/dL (ref 3.6–5.1)
Alkaline phosphatase (APISO): 86 U/L (ref 37–153)
BUN: 17 mg/dL (ref 7–25)
CO2: 28 mmol/L (ref 20–32)
Calcium: 9.8 mg/dL (ref 8.6–10.4)
Chloride: 108 mmol/L (ref 98–110)
Creat: 0.56 mg/dL (ref 0.50–1.05)
GFR, Est African American: 122 mL/min/{1.73_m2} (ref >=60)
GFR, Est Non African American: 105 mL/min/{1.73_m2} (ref >=60)
Globulin: 2.2 g/dL (calc) (ref 1.9–3.7)
Glucose, Bld: 121 mg/dL (ref 65–139)
Potassium: 4.2 mmol/L (ref 3.5–5.3)
Sodium: 144 mmol/L (ref 135–146)
Total Bilirubin: 0.4 mg/dL (ref 0.2–1.2)
Total Protein: 6.6 g/dL (ref 6.1–8.1)

## 2021-02-24 LAB — HEMOGLOBIN A1C
Hgb A1c MFr Bld: 5.9 % of total Hgb — ABNORMAL HIGH (ref <5.7)
Mean Plasma Glucose: 123 mg/dL
eAG (mmol/L): 6.8 mmol/L

## 2021-02-24 LAB — VITAMIN D 25 HYDROXY (VIT D DEFICIENCY, FRACTURES): Vit D, 25-Hydroxy: 38 ng/mL (ref 30–100)

## 2021-02-24 LAB — MICROALBUMIN / CREATININE URINE RATIO
Creatinine, Urine: 123 mg/dL (ref 20–275)
Microalb Creat Ratio: 9 mcg/mg creat (ref <30)
Microalb, Ur: 1.1 mg/dL

## 2021-02-24 LAB — LIPID PANEL
Cholesterol: 214 mg/dL — ABNORMAL HIGH (ref <200)
HDL: 48 mg/dL — ABNORMAL LOW (ref >=50)
LDL Cholesterol (Calc): 128 mg/dL (calc) — ABNORMAL HIGH
Non-HDL Cholesterol (Calc): 166 mg/dL (calc) — ABNORMAL HIGH (ref <130)
Total CHOL/HDL Ratio: 4.5 (calc) (ref <5.0)
Triglycerides: 238 mg/dL — ABNORMAL HIGH (ref <150)

## 2021-02-24 LAB — T3, FREE: T3, Free: 3.2 pg/mL (ref 2.3–4.2)

## 2021-02-24 LAB — C-REACTIVE PROTEIN: CRP: 5.2 mg/L (ref <8.0)

## 2021-02-24 LAB — SEDIMENTATION RATE: Sed Rate: 6 mm/h (ref 0–30)

## 2021-02-24 LAB — TSH+FREE T4: TSH W/REFLEX TO FT4: 1 mIU/L

## 2021-03-03 ENCOUNTER — Ambulatory Visit: Payer: BC Managed Care – PPO | Admitting: Rheumatology

## 2021-03-05 ENCOUNTER — Encounter: Payer: Self-pay | Admitting: *Deleted

## 2021-03-17 ENCOUNTER — Ambulatory Visit: Payer: Self-pay | Admitting: Family Medicine

## 2021-03-18 ENCOUNTER — Telehealth: Payer: Self-pay

## 2021-03-18 NOTE — Telephone Encounter (Signed)
Received disability paperwork for a December 2020 claim, spoke with patient and contacted Helene Kelp with [employer] to let her know Dr. Tania Ade whom performed patient's shoulder sx would need to complete those forms. Everyone is on the same page and in agreement.

## 2021-03-29 ENCOUNTER — Encounter: Payer: Self-pay | Admitting: Family Medicine

## 2021-03-30 ENCOUNTER — Other Ambulatory Visit: Payer: Self-pay | Admitting: Family Medicine

## 2021-03-30 MED ORDER — RYBELSUS 7 MG PO TABS: 7.0000 mg | ORAL_TABLET | Freq: Every day | ORAL | 2 refills | Status: DC

## 2021-04-01 ENCOUNTER — Encounter: Payer: Self-pay | Admitting: Family Medicine

## 2021-04-01 ENCOUNTER — Other Ambulatory Visit: Payer: Self-pay

## 2021-04-01 DIAGNOSIS — E1129 Type 2 diabetes mellitus with other diabetic kidney complication: Secondary | ICD-10-CM

## 2021-04-01 DIAGNOSIS — R809 Proteinuria, unspecified: Secondary | ICD-10-CM

## 2021-04-02 ENCOUNTER — Other Ambulatory Visit: Payer: Self-pay | Admitting: Emergency Medicine

## 2021-04-03 ENCOUNTER — Ambulatory Visit: Payer: BC Managed Care – PPO | Admitting: Physical Medicine and Rehabilitation

## 2021-04-03 ENCOUNTER — Other Ambulatory Visit: Payer: Self-pay | Admitting: Family Medicine

## 2021-04-03 DIAGNOSIS — E1129 Type 2 diabetes mellitus with other diabetic kidney complication: Secondary | ICD-10-CM

## 2021-04-09 ENCOUNTER — Other Ambulatory Visit: Payer: Self-pay | Admitting: Neurosurgery

## 2021-04-13 ENCOUNTER — Encounter: Payer: Self-pay | Admitting: Family Medicine

## 2021-04-14 ENCOUNTER — Other Ambulatory Visit: Payer: Self-pay | Admitting: Family Medicine

## 2021-04-14 ENCOUNTER — Encounter: Payer: Self-pay | Admitting: Family Medicine

## 2021-04-17 ENCOUNTER — Ambulatory Visit: Payer: BC Managed Care – PPO | Admitting: Physical Medicine and Rehabilitation

## 2021-04-21 ENCOUNTER — Other Ambulatory Visit: Payer: Self-pay

## 2021-04-21 ENCOUNTER — Other Ambulatory Visit
Admission: RE | Admit: 2021-04-21 | Discharge: 2021-04-21 | Disposition: A | Discharge status: Home / Self Care | Payer: BC Managed Care – PPO | Source: Ambulatory Visit | Attending: Neurosurgery | Admitting: Neurosurgery

## 2021-04-21 DIAGNOSIS — Z01818 Encounter for other preprocedural examination: Secondary | ICD-10-CM | POA: Insufficient documentation

## 2021-04-21 HISTORY — DX: Prediabetes: R73.03

## 2021-04-21 LAB — URINALYSIS, ROUTINE W REFLEX MICROSCOPIC
Bacteria, UA: NONE SEEN
Bilirubin Urine: NEGATIVE
Glucose, UA: >=500 mg/dL — AB
Ketones, ur: NEGATIVE mg/dL
Nitrite: NEGATIVE
Protein, ur: NEGATIVE mg/dL
Specific Gravity, Urine: 1.027 (ref 1.005–1.030)
pH: 5 (ref 5.0–8.0)

## 2021-04-21 LAB — SURGICAL PCR SCREEN
MRSA, PCR: NEGATIVE
Staphylococcus aureus: NEGATIVE

## 2021-04-21 LAB — PROTIME-INR
INR: 1 (ref 0.8–1.2)
Prothrombin Time: 12.8 seconds (ref 11.4–15.2)

## 2021-04-21 LAB — APTT: aPTT: 30 seconds (ref 24–36)

## 2021-04-21 NOTE — Patient Instructions (Addendum)
Your procedure is scheduled on: Monday 04/27/21 Report to [facility]. To find out your arrival time please call [phone] between 1PM - 3PM on Friday 04/24/21.  Remember: Instructions that are not followed completely may result in serious medical risk, up to and including death, or upon the discretion of your surgeon and anesthesiologist your surgery may need to be rescheduled.     _X__ 1. Do not eat food after midnight the night before your procedure.                 No gum chewing or hard candies. You may drink clear liquids up to 2 hours                 before you are scheduled to arrive for your surgery- DO not drink clear                 liquids within 2 hours of the start of your surgery.                 Clear Liquids include:  water, apple juice without pulp, clear carbohydrate                 drink such as Clearfast or Gatorade, Black Coffee or Tea (Do not add                 anything to coffee or tea). Diabetics water only  __X__2.  On the morning of surgery brush your teeth with toothpaste and water, you                 may rinse your mouth with mouthwash if you wish.  Do not swallow any              toothpaste of mouthwash.     _X__ 3.  No Alcohol for 24 hours before or after surgery.   _X__ 4.  Do Not Smoke or use e-cigarettes For 24 Hours Prior to Your Surgery.                 Do not use any chewable tobacco products for at least 6 hours prior to                 surgery.  ____  5.  Bring all medications with you on the day of surgery if instructed.   __X__  6.  Notify your doctor if there is any change in your medical condition      (cold, fever, infections).     Do not wear jewelry, make-up, hairpins, clips or nail polish. Do not wear lotions, powders, or perfumes.  Do not shave 48 hours prior to surgery. Men may shave face and neck. Do not bring valuables to the hospital.    [facility] is not responsible for any  belongings or valuables.  Contacts, dentures/partials or body piercings may not be worn into surgery. Bring a case for your contacts, glasses or hearing aids, a denture cup will be supplied. Leave your suitcase in the car. After surgery it may be brought to your room. For patients admitted to the hospital, discharge time is determined by your treatment team.   Patients discharged the day of surgery will not be allowed to drive home.   Please read over the following fact sheets that you were given:   MRSA Information, CHG Soap  __X__ Take these medicines the morning of surgery with A SIP  OF WATER:    1. omeprazole (PRILOSEC) 20 MG capsule (take extra tablet the night before also)  2.   3.   4.  5.  6.  ____ Fleet Enema (as directed)   __X__ Use CHG Soap/SAGE wipes as directed  PATCH TEST FIRST  __X__ Use on the day of surgery  USE YOUR NEBULIZER THE MORNING OF SURGERY  ____ Stop metformin/Janumet/Farxiga 2 days prior to surgery    ____ Take 1/2 of usual insulin dose the night before surgery. No insulin the morning          of surgery.   ____ Stop Blood Thinners Coumadin/Plavix/Xarelto/Pleta/Pradaxa/Eliquis/Effient/Aspirin  on   Or contact your Surgeon, Cardiologist or Medical Doctor regarding  ability to stop your blood thinners  __X__ Stop Anti-inflammatories 7 days before surgery such as Advil, Ibuprofen, Motrin,  BC or Goodies Powder, Naprosyn, Naproxen, Aleve, Aspirin    __X__ Stop all herbal supplements, fish oil or vitamin E until after surgery.    ____ Bring C-Pap to the hospital.

## 2021-04-26 MED ORDER — ORAL CARE MOUTH RINSE: 15.0000 mL | Freq: Once | OROMUCOSAL | Status: DC

## 2021-04-26 MED ORDER — CEFAZOLIN SODIUM-DEXTROSE 2-4 GM/100ML-% IV SOLN
2.0000 g | INTRAVENOUS | Status: AC
  Administered 2021-04-27: 2 g via INTRAVENOUS

## 2021-04-26 MED ORDER — CHLORHEXIDINE GLUCONATE 0.12 % MT SOLN: 15.0000 mL | Freq: Once | OROMUCOSAL | Status: DC

## 2021-04-26 MED ORDER — LACTATED RINGERS IV SOLN: INTRAVENOUS | Status: DC

## 2021-04-26 NOTE — Progress Notes (Signed)
Pharmacy Antibiotic Note  Yolanda Pace is a 55 y.o. female admitted on (Not on file) with surgical prophylaxis.  Pharmacy has been consulted for cefazolin dosing.  TBW = 81.6 kg   Plan: Cefazolin 2 gm IV X 1 60 min pre-op on 6/13 @ 0500 ordered.      No data recorded.  No results for input(s): WBC, CREATININE, LATICACIDVEN, VANCOTROUGH, VANCOPEAK, VANCORANDOM, GENTTROUGH, GENTPEAK, GENTRANDOM, TOBRATROUGH, TOBRAPEAK, TOBRARND, AMIKACINPEAK, AMIKACINTROU, AMIKACIN in the last 168 hours.  CrCl cannot be calculated (Patient's most recent lab result is older than the maximum 21 days allowed.).    Allergies  Allergen Reactions   Betadine [Povidone Iodine] Swelling, Dermatitis and Rash    Pt states site gets infected   Metrizamide Swelling    Noted in [facility] chart on 01/06/2012. Premedicate patient prior to contrast media injections.   Other     Mescalor: vomiting    Salicylic Acid-Sulfur     Burst eye vessels   Bee Pollen     Sneezing, watery eyes, coughing.    Meloxicam Itching   Compazine  [Prochlorperazine] Other (See Comments)    Jaws locked up   Contrast Media [Iodinated Diagnostic Agents] Swelling    Noted in [facility] chart on 01/06/2012. Premedicate patient prior to contrast media injections.   Doxycycline Nausea Only    GI upset   Elemental Sulfur Other (See Comments)    Made all blood vessels in eyes burst   Gabapentin Nausea And Vomiting    Lip numbness    Keppra [Levetiracetam]     Pt was not herself    Lyrica [Pregabalin]     Tingling, depression, blister mouth    Rosuvastatin     Mental fogginess, confusion, fatigue    Codeine Nausea And Vomiting   Hydroxychloroquine Nausea And Vomiting and Nausea Only    Ear clicking   Iodine Swelling   Sulfa Antibiotics Nausea And Vomiting and Other (See Comments)    Antimicrobials this admission:   >>    >>   Dose adjustments this admission:   Microbiology results:  BCx:   UCx:    Sputum:    MRSA PCR:    Thank you for allowing pharmacy to be a part of this patient's care.  Marlee Armenteros D 04/26/2021 11:06 PM

## 2021-04-27 ENCOUNTER — Ambulatory Visit: Payer: BC Managed Care – PPO

## 2021-04-27 ENCOUNTER — Ambulatory Visit: Payer: BC Managed Care – PPO | Admitting: Certified Registered"

## 2021-04-27 ENCOUNTER — Encounter: Payer: Self-pay | Admitting: Neurosurgery

## 2021-04-27 ENCOUNTER — Other Ambulatory Visit: Payer: Self-pay

## 2021-04-27 ENCOUNTER — Encounter
Admission: RE | Disposition: A | Discharge status: Home / Self Care | Payer: Self-pay | Source: Home / Self Care | Attending: Neurosurgery

## 2021-04-27 ENCOUNTER — Ambulatory Visit
Admission: RE | Admit: 2021-04-27 | Discharge: 2021-04-27 | Disposition: A | Discharge status: Home / Self Care | Payer: BC Managed Care – PPO | Attending: Neurosurgery | Admitting: Neurosurgery

## 2021-04-27 DIAGNOSIS — Z91030 Bee allergy status: Secondary | ICD-10-CM | POA: Insufficient documentation

## 2021-04-27 DIAGNOSIS — Z888 Allergy status to other drugs, medicaments and biological substances status: Secondary | ICD-10-CM | POA: Insufficient documentation

## 2021-04-27 DIAGNOSIS — Z885 Allergy status to narcotic agent status: Secondary | ICD-10-CM | POA: Insufficient documentation

## 2021-04-27 DIAGNOSIS — Z85828 Personal history of other malignant neoplasm of skin: Secondary | ICD-10-CM | POA: Insufficient documentation

## 2021-04-27 DIAGNOSIS — Z882 Allergy status to sulfonamides status: Secondary | ICD-10-CM | POA: Diagnosis not present

## 2021-04-27 DIAGNOSIS — Z91041 Radiographic dye allergy status: Secondary | ICD-10-CM | POA: Insufficient documentation

## 2021-04-27 DIAGNOSIS — M5416 Radiculopathy, lumbar region: Secondary | ICD-10-CM | POA: Insufficient documentation

## 2021-04-27 DIAGNOSIS — M48061 Spinal stenosis, lumbar region without neurogenic claudication: Secondary | ICD-10-CM | POA: Insufficient documentation

## 2021-04-27 DIAGNOSIS — Z881 Allergy status to other antibiotic agents status: Secondary | ICD-10-CM | POA: Insufficient documentation

## 2021-04-27 DIAGNOSIS — Z79899 Other long term (current) drug therapy: Secondary | ICD-10-CM | POA: Insufficient documentation

## 2021-04-27 DIAGNOSIS — Z419 Encounter for procedure for purposes other than remedying health state, unspecified: Secondary | ICD-10-CM

## 2021-04-27 DIAGNOSIS — I10 Essential (primary) hypertension: Secondary | ICD-10-CM

## 2021-04-27 HISTORY — PX: HEMI-MICRODISCECTOMY LUMBAR LAMINECTOMY LEVEL 1: SHX5846

## 2021-04-27 LAB — TYPE AND SCREEN
ABO/RH(D): A POS
Antibody Screen: NEGATIVE

## 2021-04-27 SURGERY — HEMI-MICRODISCECTOMY LUMBAR LAMINECTOMY LEVEL 1
Anesthesia: General | Laterality: Left

## 2021-04-27 MED ORDER — MIDAZOLAM HCL 2 MG/2ML IJ SOLN
INTRAMUSCULAR | Status: DC | PRN
  Administered 2021-04-27: 2 mg via INTRAVENOUS

## 2021-04-27 MED ORDER — FENTANYL CITRATE (PF) 100 MCG/2ML IJ SOLN
INTRAMUSCULAR | Status: AC
  Administered 2021-04-27: 25 ug via INTRAVENOUS
  Filled 2021-04-27: qty 2

## 2021-04-27 MED ORDER — PHENYLEPHRINE HCL-NACL 20-0.9 MG/250ML-% IV SOLN
INTRAVENOUS | Status: DC | PRN
  Administered 2021-04-27: 50 ug/min via INTRAVENOUS

## 2021-04-27 MED ORDER — FENTANYL CITRATE (PF) 100 MCG/2ML IJ SOLN
INTRAMUSCULAR | Status: DC | PRN
  Administered 2021-04-27 (×2): 50 ug via INTRAVENOUS

## 2021-04-27 MED ORDER — OXYCODONE HCL 5 MG/5ML PO SOLN: 5.0000 mg | Freq: Once | ORAL | Status: DC | PRN

## 2021-04-27 MED ORDER — FENTANYL CITRATE (PF) 100 MCG/2ML IJ SOLN
25.0000 ug | INTRAMUSCULAR | Status: AC | PRN
  Administered 2021-04-27 (×4): 25 ug via INTRAVENOUS

## 2021-04-27 MED ORDER — THROMBIN 5000 UNITS EX SOLR
CUTANEOUS | Status: DC | PRN
  Administered 2021-04-27: 5000 [IU] via TOPICAL

## 2021-04-27 MED ORDER — ONDANSETRON HCL 4 MG/2ML IJ SOLN
INTRAMUSCULAR | Status: DC | PRN
  Administered 2021-04-27: 4 mg via INTRAVENOUS

## 2021-04-27 MED ORDER — ONDANSETRON HCL 4 MG/2ML IJ SOLN
INTRAMUSCULAR | Status: AC
  Filled 2021-04-27: qty 2

## 2021-04-27 MED ORDER — DEXAMETHASONE SODIUM PHOSPHATE 10 MG/ML IJ SOLN
INTRAMUSCULAR | Status: AC
  Filled 2021-04-27: qty 1

## 2021-04-27 MED ORDER — PROPOFOL 10 MG/ML IV BOLUS
INTRAVENOUS | Status: DC | PRN
  Administered 2021-04-27: 170 mg via INTRAVENOUS

## 2021-04-27 MED ORDER — PROPOFOL 10 MG/ML IV BOLUS
INTRAVENOUS | Status: AC
  Filled 2021-04-27: qty 20

## 2021-04-27 MED ORDER — EPHEDRINE SULFATE 50 MG/ML IJ SOLN
INTRAMUSCULAR | Status: DC | PRN
  Administered 2021-04-27 (×2): 10 mg via INTRAVENOUS

## 2021-04-27 MED ORDER — LIDOCAINE HCL (CARDIAC) PF 100 MG/5ML IV SOSY
PREFILLED_SYRINGE | INTRAVENOUS | Status: DC | PRN
  Administered 2021-04-27: 100 mg via INTRAVENOUS

## 2021-04-27 MED ORDER — CYCLOBENZAPRINE HCL 10 MG PO TABS: 10.0000 mg | ORAL_TABLET | Freq: Three times a day (TID) | ORAL | 0 refills | Status: DC | PRN

## 2021-04-27 MED ORDER — EPHEDRINE 5 MG/ML INJ
INTRAVENOUS | Status: AC
  Filled 2021-04-27: qty 10

## 2021-04-27 MED ORDER — BUPIVACAINE-EPINEPHRINE (PF) 0.5% -1:200000 IJ SOLN
INTRAMUSCULAR | Status: AC
  Filled 2021-04-27: qty 30

## 2021-04-27 MED ORDER — KETAMINE HCL 10 MG/ML IJ SOLN
INTRAMUSCULAR | Status: DC | PRN
  Administered 2021-04-27: 25 mg via INTRAVENOUS

## 2021-04-27 MED ORDER — CEFAZOLIN SODIUM-DEXTROSE 2-4 GM/100ML-% IV SOLN
INTRAVENOUS | Status: AC
  Filled 2021-04-27: qty 100

## 2021-04-27 MED ORDER — KETAMINE HCL 50 MG/ML IJ SOLN
INTRAMUSCULAR | Status: AC
  Filled 2021-04-27: qty 1

## 2021-04-27 MED ORDER — MIDAZOLAM HCL 2 MG/2ML IJ SOLN
INTRAMUSCULAR | Status: AC
  Filled 2021-04-27: qty 2

## 2021-04-27 MED ORDER — OXYCODONE HCL 5 MG PO TABS
5.0000 mg | ORAL_TABLET | Freq: Once | ORAL | Status: DC | PRN
Start: 2021-04-27 — End: 2021-04-27

## 2021-04-27 MED ORDER — APREPITANT 40 MG PO CAPS
ORAL_CAPSULE | ORAL | Status: AC
  Administered 2021-04-27: 40 mg
  Filled 2021-04-27: qty 1

## 2021-04-27 MED ORDER — METHYLPREDNISOLONE ACETATE 40 MG/ML IJ SUSP
INTRAMUSCULAR | Status: DC | PRN
  Administered 2021-04-27: 40 mg

## 2021-04-27 MED ORDER — METHYLPREDNISOLONE ACETATE 40 MG/ML IJ SUSP
INTRAMUSCULAR | Status: AC
  Filled 2021-04-27: qty 1

## 2021-04-27 MED ORDER — SUCCINYLCHOLINE CHLORIDE 20 MG/ML IJ SOLN
INTRAMUSCULAR | Status: DC | PRN
  Administered 2021-04-27: 100 mg via INTRAVENOUS

## 2021-04-27 MED ORDER — LIDOCAINE HCL (PF) 2 % IJ SOLN
INTRAMUSCULAR | Status: AC
  Filled 2021-04-27: qty 5

## 2021-04-27 MED ORDER — DEXAMETHASONE SODIUM PHOSPHATE 10 MG/ML IJ SOLN
INTRAMUSCULAR | Status: DC | PRN
  Administered 2021-04-27: 10 mg via INTRAVENOUS

## 2021-04-27 MED ORDER — ACETAMINOPHEN 10 MG/ML IV SOLN
INTRAVENOUS | Status: AC
  Filled 2021-04-27: qty 100

## 2021-04-27 MED ORDER — FENTANYL CITRATE (PF) 100 MCG/2ML IJ SOLN
INTRAMUSCULAR | Status: AC
  Filled 2021-04-27: qty 2

## 2021-04-27 MED ORDER — BUPIVACAINE-EPINEPHRINE (PF) 0.5% -1:200000 IJ SOLN
INTRAMUSCULAR | Status: DC | PRN
  Administered 2021-04-27: 8 mL

## 2021-04-27 MED ORDER — ONDANSETRON HCL 4 MG/2ML IJ SOLN
4.0000 mg | Freq: Once | INTRAMUSCULAR | Status: AC
  Administered 2021-04-27: 4 mg via INTRAVENOUS

## 2021-04-27 MED ORDER — ROCURONIUM BROMIDE 100 MG/10ML IV SOLN
INTRAVENOUS | Status: DC | PRN
  Administered 2021-04-27: 10 mg via INTRAVENOUS

## 2021-04-27 MED ORDER — CHLORHEXIDINE GLUCONATE 0.12 % MT SOLN
OROMUCOSAL | Status: AC
  Filled 2021-04-27: qty 15

## 2021-04-27 MED ORDER — PHENYLEPHRINE HCL (PRESSORS) 10 MG/ML IV SOLN
INTRAVENOUS | Status: DC | PRN
  Administered 2021-04-27: 200 ug via INTRAVENOUS
  Administered 2021-04-27: 100 ug via INTRAVENOUS
  Administered 2021-04-27: 200 ug via INTRAVENOUS

## 2021-04-27 MED ORDER — ACETAMINOPHEN 10 MG/ML IV SOLN
INTRAVENOUS | Status: DC | PRN
  Administered 2021-04-27: 1000 mg via INTRAVENOUS

## 2021-04-27 MED ORDER — MEPERIDINE HCL 25 MG/ML IJ SOLN: 6.2500 mg | INTRAMUSCULAR | Status: DC | PRN

## 2021-04-27 SURGICAL SUPPLY — 67 items
ADH SKN CLS APL DERMABOND .7 (GAUZE/BANDAGES/DRESSINGS) ×1
AGENT HMST MTR 8 SURGIFLO (HEMOSTASIS) ×1
APL PRP STRL LF DISP 70% ISPRP (MISCELLANEOUS) ×1
APL SRG 60D 8 XTD TIP BNDBL (TIP)
BUR NEURO DRILL SOFT 3.0X3.8M (BURR) ×2 IMPLANT
CANISTER SUCT 1200ML W/VALVE (MISCELLANEOUS) ×2 IMPLANT
CHLORAPREP W/TINT 26 (MISCELLANEOUS) ×2 IMPLANT
COUNTER NEEDLE 20/40 LG (NEEDLE) ×2 IMPLANT
COVER LIGHT HANDLE STERIS (MISCELLANEOUS) ×4 IMPLANT
COVER WAND RF STERILE (DRAPES) ×2 IMPLANT
CUP MEDICINE 2OZ PLAST GRAD ST (MISCELLANEOUS) ×2 IMPLANT
DERMABOND ADVANCED (GAUZE/BANDAGES/DRESSINGS) ×1
DERMABOND ADVANCED .7 DNX12 (GAUZE/BANDAGES/DRESSINGS) ×1 IMPLANT
DRAPE C-ARM 42X72 X-RAY (DRAPES) ×4 IMPLANT
DRAPE INCISE 23X17 IOBAN STRL (DRAPES) ×1
DRAPE INCISE 23X17 STRL (DRAPES) IMPLANT
DRAPE INCISE IOBAN 23X17 STRL (DRAPES) ×1 IMPLANT
DRAPE LAPAROTOMY 100X77 ABD (DRAPES) ×2 IMPLANT
DRAPE MICROSCOPE SPINE 48X150 (DRAPES) ×1 IMPLANT
DRAPE SURG 17X11 SM STRL (DRAPES) ×2 IMPLANT
DRSG OPSITE POSTOP 4X6 (GAUZE/BANDAGES/DRESSINGS) ×1 IMPLANT
DRSG TEGADERM 4X4.75 (GAUZE/BANDAGES/DRESSINGS) IMPLANT
DRSG TELFA 4X3 1S NADH ST (GAUZE/BANDAGES/DRESSINGS) IMPLANT
DURASEAL APPLICATOR TIP (TIP) IMPLANT
DURASEAL SPINE SEALANT 3ML (MISCELLANEOUS) IMPLANT
ELECT CAUTERY BLADE TIP 2.5 (TIP) ×2
ELECT EZSTD 165MM 6.5IN (MISCELLANEOUS) ×2
ELECT REM PT RETURN 9FT ADLT (ELECTROSURGICAL) ×2
ELECTRODE CAUTERY BLDE TIP 2.5 (TIP) ×1 IMPLANT
ELECTRODE EZSTD 165MM 6.5IN (MISCELLANEOUS) ×1 IMPLANT
ELECTRODE REM PT RTRN 9FT ADLT (ELECTROSURGICAL) ×1 IMPLANT
GAUZE SPONGE 4X4 12PLY STRL (GAUZE/BANDAGES/DRESSINGS) ×2 IMPLANT
GLOVE SRG 8 PF TXTR STRL LF DI (GLOVE) ×1 IMPLANT
GLOVE SURG SYN 7.0 (GLOVE) IMPLANT
GLOVE SURG SYN 7.0 PF PI (GLOVE) ×2 IMPLANT
GLOVE SURG SYN 8.0 (GLOVE) ×4 IMPLANT
GLOVE SURG SYN 8.0 PF PI (GLOVE) ×2 IMPLANT
GLOVE SURG UNDER POLY LF SZ7 (GLOVE) IMPLANT
GLOVE SURG UNDER POLY LF SZ8 (GLOVE) ×2
GOWN STRL REUS W/ TWL XL LVL3 (GOWN DISPOSABLE) ×2 IMPLANT
GOWN STRL REUS W/TWL XL LVL3 (GOWN DISPOSABLE) ×4
GRADUATE 1200CC STRL 31836 (MISCELLANEOUS) ×2 IMPLANT
IV CATH ANGIO 12GX3 LT BLUE (NEEDLE) ×1 IMPLANT
KIT SPINAL PRONEVIEW (KITS) ×1 IMPLANT
KIT TURNOVER KIT A (KITS) ×2 IMPLANT
KIT WILSON FRAME (KITS) ×1 IMPLANT
KNIFE BAYONET SHORT DISCETOMY (MISCELLANEOUS) IMPLANT
MANIFOLD NEPTUNE II (INSTRUMENTS) ×2 IMPLANT
MARKER SKIN DUAL TIP RULER LAB (MISCELLANEOUS) ×4 IMPLANT
NDL SAFETY ECLIPSE 18X1.5 (NEEDLE) ×1 IMPLANT
NEEDLE HYPO 18GX1.5 SHARP (NEEDLE) ×2
NEEDLE HYPO 22GX1.5 SAFETY (NEEDLE) ×2 IMPLANT
NS IRRIG 1000ML POUR BTL (IV SOLUTION) ×2 IMPLANT
PACK LAMINECTOMY NEURO (CUSTOM PROCEDURE TRAY) ×2 IMPLANT
PAD ARMBOARD 7.5X6 YLW CONV (MISCELLANEOUS) ×2 IMPLANT
SPOGE SURGIFLO 8M (HEMOSTASIS) ×2
SPONGE SURGIFLO 8M (HEMOSTASIS) ×1 IMPLANT
STAPLER SKIN PROX 35W (STAPLE) IMPLANT
SUT NURALON 4 0 TR CR/8 (SUTURE) IMPLANT
SUT POLYSORB 2-0 5X18 GS-10 (SUTURE) ×2 IMPLANT
SUT VIC AB 0 CT1 18XCR BRD 8 (SUTURE) ×1 IMPLANT
SUT VIC AB 0 CT1 8-18 (SUTURE) ×2
SYR 10ML LL (SYRINGE) ×4 IMPLANT
SYR 30ML LL (SYRINGE) ×2 IMPLANT
SYR 3ML LL SCALE MARK (SYRINGE) ×2 IMPLANT
TOWEL OR 17X26 4PK STRL BLUE (TOWEL DISPOSABLE) ×8 IMPLANT
TUBING CONNECTING 10 (TUBING) ×2 IMPLANT

## 2021-04-27 NOTE — Op Note (Signed)
Operative Note   SURGERY DATE:  04/27/2021   PRE-OP DIAGNOSIS:  Lumbar Stenosis with Lumbar Radiculopathy (m48.062)   POST-OP DIAGNOSIS: Post-Op Diagnosis Codes:  Lumbar Stenosis with Lumbar Radiculopathy (Y77.412)   Procedure(s) with comments: Left l4/5 Hemilaminectomy and Decompression     SURGEON:     * Malen Gauze, MD       Liliane Bade, PA Assistant   ANESTHESIA: General    OPERATIVE FINDINGS: Stenosis at left L4/5   PROCEDURE NOTE Indication: Yolanda Pace presented to the clinic on 3/8 with ongoing left leg pain and numbness. She underwent conservative management to include OTC medications, steroids, steroid injection, physical therapy, and prescription medications without relief. MRI shows an extruded disc fragment on the left L4/5 area and EMG confirmed radiculopathy. Given the symptoms, a left L4/5 hemilaminectomy and discectomy was recommended. The risks of surgery were explained to include hematoma, infection, damage to nerve roots, CSF leak, weakness, numbness, pain, need for future surgery, heart attack, and stroke. She elected to proceed with surgery for symptom relief.    Procedure The patient was brought to the OR after informed consent was obtained. She was given general anesthesia and intubated by the anesthesia service. Vascular access lines were placed.The patient was then placed prone on a Wilson frame ensuring all pressure points were padded. A time-out was performed per protocol.    The patient was sterilely prepped and draped. The fluoroscopy unit was used to identify the L4/5 interspace and an incision marked about 1.5 cm off midline at that level. Local anesthetic was instilled. The skin was opened sharply and cautery used to make a fascial opening. The dilator was placed down through the muscle and docked on the L4/5 interspace. This was confirmed by fluoroscopy. The remaining dilators were inserted until 72mm tube was inserted. Fluoroscopy again confirmed  adequate placement. The dilators were removed leaving only the working channel. The microscope was brought into the field.    Next, a drill bit was used to remove the inferior L4 lamina as well as the medial facet. The underlying ligament was freed and removed with combination of rongeurs. The dura was then seen. All soft tissue overlying this was removed. The dura was then retracted medially and the traversing nerve root identified. There was no obvious free fragment visible beneath this. The disc space was inspected and found to be flat without obvious nerve root compression. A blunt probe was used in the neuroforamina and some additional ligament was removed but no disc fragment expressed. Hemostasis was achieved and area irrigated. Depo-medrol was placed along the nerve root.   The working channel was removed. The fascia was re-approximated with a 0 vicryl.  Next, multiple subcutaneous and dermal layers were closed with 2-0 vicryl until the epidermis was well approximated. The skin was closed with Dermabond.    The patient was returned to supine position and extubated by the anesthesia service. The patient was then taken to the PACU for post-operative care where he was moving extremities symmetrically.    ESTIMATED BLOOD LOSS:   20 cc   SPECIMENS None   IMPLANT None     I performed the case in its entirety with assistance of Liliane Bade, [place], [place]

## 2021-04-27 NOTE — Anesthesia Postprocedure Evaluation (Signed)
Anesthesia Post Note  Patient: Yolanda Pace  Procedure(s) Performed: LEFT L4-5 HEMILAMINECTOMY (Left)  Patient location during evaluation: PACU Anesthesia Type: General Level of consciousness: awake and alert and oriented Pain management: pain level controlled Vital Signs Assessment: post-procedure vital signs reviewed and stable Respiratory status: spontaneous breathing, nonlabored ventilation and respiratory function stable Cardiovascular status: blood pressure returned to baseline and stable Postop Assessment: no signs of nausea or vomiting Anesthetic complications: no   No notable events documented.   Last Vitals:  Vitals:   04/27/21 1426 04/27/21 1430  BP: 126/72 (!) 119/58  Pulse:  85  Resp: 16 15  Temp:    SpO2: 94% 90%    Last Pain:  Vitals:   04/27/21 1426  TempSrc:   PainSc: 4                  Maykel Reitter

## 2021-04-27 NOTE — Anesthesia Procedure Notes (Signed)
Procedure Name: Intubation Date/Time: 04/27/2021 10:30 AM Performed by: Natasha Mead, CRNA Pre-anesthesia Checklist: Patient identified, Emergency Drugs available, Suction available and Patient being monitored Patient Re-evaluated:Patient Re-evaluated prior to induction Oxygen Delivery Method: Circle system utilized Preoxygenation: Pre-oxygenation with 100% oxygen Induction Type: IV induction Ventilation: Mask ventilation without difficulty Laryngoscope Size: Miller and 2 Grade View: Grade II Tube type: Oral Tube size: 7.0 mm Number of attempts: 1 Airway Equipment and Method: Stylet and Oral airway Placement Confirmation: ETT inserted through vocal cords under direct vision, positive ETCO2 and breath sounds checked- equal and bilateral Secured at: 21 cm Tube secured with: Tape Dental Injury: Teeth and Oropharynx as per pre-operative assessment

## 2021-04-27 NOTE — H&P (Signed)
Yolanda Pace is an 55 y.o. female.   Chief Complaint: Leg pain HPI: Ms. Gu is here for evaluation of ongoing symptoms of left leg pain and numbness. She does state that she has had the left leg pain for many years. She did have a bladder surgery which was malpositioned per her report. This was removed. She states the pain from the surgeries were on the medial and lateral side of her thigh going down towards her knee. More recently, about a month ago, she did have onset of sharp back pain going down to the tailbone. This caused some pain going into the heel of her left foot but also some numbness on the lateral side of the foot and toes. This was new. She did undergo recent injections and she does feel like this helped some but she continues to have the symptoms. She denies any right leg symptoms. She did go for physical therapy and she feels like this actually worsened her symptoms. She did go for a MRI of the lumbar spine that showed some stenosis on left at L4/5. She would like to proceed with decompression     Past Medical History:  Diagnosis Date   Allergic rhinitis    Anxiety    Arthritis    Asthma    WELL CONTROLLED   Basal cell carcinoma 05/31/2014   left spinal mid back   Complication of anesthesia    PT STATES THAT SHE WAS GIVEN SOME TYPE OF ANESTHESIA THAT MADE HER HAVE A SEIZURE IN 2015-NOTE IS IN CHART FROM [place] ANESTH[facility]IA FROM 2015   Cystic thyroid nodule March 2015   70mm on u/s   Depression    Fibromyalgia    GERD (gastroesophageal reflux disease)    History of kidney stones    SMALL STONE CURRENTLY   Hypertension    Insomnia    Joint pain 04/2018   PT SEEING RHEUMATOLOGIST FOR JOINT PAIN THAT OCCURS APP ONCE A MONTH AND IS SO BAD THAT SHE CANT GET OUT OF BED   Obesity    PONV (postoperative nausea and vomiting)    Pre-diabetes    Right thyroid nodule 05/26/2017   Solid 1 cm RIGHT lobe July 2018   Seizures [facility]) June 2015   provoked during cervical nerve block    Skin cancer Aug 2015   removed from back   Squamous cell carcinoma of skin 08/11/2020   Right forearm, EDC   Vitamin D deficiency disease     Past Surgical History:  Procedure Laterality Date   ABDOMINAL HYSTERECTOMY  2005ish   fibroids, ovaries remain; along with bladder tack   bladder sling mesh removal  08/2018   bladder tack  2005ish   CHOLECYSTECTOMY     COLONOSCOPY WITH PROPOFOL N/A 06/22/2018   Procedure: COLONOSCOPY WITH PROPOFOL;  Surgeon: Virgel Manifold, MD;  Location: [facility] ENDOSCOPY;  Service: Endoscopy;  Laterality: N/A;   CYSTO WITH HYDRODISTENSION N/A 05/02/2018   Procedure: CYSTOSCOPY/HYDRODISTENSION;  Surgeon: Royston Cowper, MD;  Location: [facility] ORS;  Service: Urology;  Laterality: N/A;   ESOPHAGOGASTRODUODENOSCOPY (EGD) WITH PROPOFOL N/A 06/22/2018   Procedure: ESOPHAGOGASTRODUODENOSCOPY (EGD) WITH PROPOFOL;  Surgeon: Virgel Manifold, MD;  Location: [facility] ENDOSCOPY;  Service: Endoscopy;  Laterality: N/A;   INCONTINENCE SURGERY     ROTATOR CUFF REPAIR Left 01/08/2019   rotator cuff revision Left 12/06/2019   SKIN CANCER EXCISION  Aug 2015   removed from back    Family History  Problem Relation Age of Onset  Hyperlipidemia Mother    Diabetes Sister    Hyperlipidemia Sister    Hypertension Sister    Hypertension Brother    Hypertension Son    Kidney Stones Son    Pneumonia Maternal Grandmother    Heart attack Maternal Grandfather    Hypertension Sister    Epilepsy Sister    Hypertension Sister    Hypertension Daughter    Kidney Stones Daughter    Cancer Neg Hx    COPD Neg Hx    Stroke Neg Hx    Social History:  reports that she has never smoked. She has never used smokeless tobacco. She reports current alcohol use. She reports that she does not use drugs.  Allergies:  Allergies  Allergen Reactions   Betadine [Povidone Iodine] Swelling, Dermatitis and Rash    Pt states site gets infected   Metrizamide Swelling    Noted in [facility] chart on  01/06/2012. Premedicate patient prior to contrast media injections.   Other     Mescalor: vomiting    Salicylic Acid-Sulfur     Burst eye vessels   Bee Pollen     Sneezing, watery eyes, coughing.    Meloxicam Itching   Compazine  [Prochlorperazine] Other (See Comments)    Jaws locked up   Contrast Media [Iodinated Diagnostic Agents] Swelling    Noted in [facility] chart on 01/06/2012. Premedicate patient prior to contrast media injections.   Doxycycline Nausea Only    GI upset   Elemental Sulfur Other (See Comments)    Made all blood vessels in eyes burst   Gabapentin Nausea And Vomiting    Lip numbness    Keppra [Levetiracetam]     Pt was not herself    Lyrica [Pregabalin]     Tingling, depression, blister mouth    Rosuvastatin     Mental fogginess, confusion, fatigue    Codeine Nausea And Vomiting   Hydroxychloroquine Nausea And Vomiting and Nausea Only    Ear clicking   Iodine Swelling   Sulfa Antibiotics Nausea And Vomiting and Other (See Comments)    Medications Prior to Admission  Medication Sig Dispense Refill   acetaminophen (TYLENOL) 500 MG tablet Take 1,000 mg by mouth every 8 (eight) hours as needed for moderate pain.     albuterol (PROVENTIL) (2.5 MG/3ML) 0.083% nebulizer solution Take 3 mLs (2.5 mg total) by nebulization every 4 (four) hours as needed for wheezing or shortness of breath. 75 mL 1   albuterol (VENTOLIN HFA) 108 (90 Base) MCG/ACT inhaler Inhale 2 puffs into the lungs 4 (four) times daily as needed for shortness of breath or wheezing.     Cholecalciferol (VITAMIN D) 2000 units tablet Take 2,000 Units by mouth daily.      cyclobenzaprine (FLEXERIL) 10 MG tablet Take 1 tablet (10 mg total) by mouth daily as needed. (Patient taking differently: Take 1 tablet by mouth daily as needed for muscle spasms.) 90 tablet 0   DULoxetine (CYMBALTA) 60 MG capsule Take 1 capsule (60 mg total) by mouth daily. (Patient taking differently: Take 60 mg by mouth at bedtime.)  90 capsule 1   ibuprofen (ADVIL) 200 MG tablet Take 400-600 mg by mouth every 8 (eight) hours as needed for moderate pain.     levocetirizine (XYZAL) 5 MG tablet Take 1 tablet (5 mg total) by mouth every evening. (Patient taking differently: Take 5 mg by mouth at bedtime.) 90 tablet 1   montelukast (SINGULAIR) 10 MG tablet Take 1 tablet (10 mg  total) by mouth at bedtime. 90 tablet 1   olmesartan (BENICAR) 40 MG tablet Take 1 tablet (40 mg total) by mouth daily. In place of norvasc (Patient taking differently: Take 40 mg by mouth at bedtime. In place of norvasc) 90 tablet 1   omeprazole (PRILOSEC) 20 MG capsule Take 20 mg by mouth every morning.      blood glucose meter kit and supplies Dispense based on patient and insurance preference. Use up to four times daily as directed. (FOR ICD-10 E10.9, E11.9). 1 each 0   levETIRAcetam (KEPPRA) 500 MG tablet Take 1 tablet (500 mg total) by mouth 2 (two) times daily. X 1 week, then 1000 mg 2x/day, for nerve pain 120 tablet 5   Semaglutide (RYBELSUS) 7 MG TABS Take 7 mg by mouth daily. 30 tablet 2    No results found for this or any previous visit (from the past 48 hour(s)). No results found.  Review of Systems General ROS: Negative Psychological ROS: Negative Ophthalmic ROS: Negative ENT ROS: Negative Hematological and Lymphatic ROS: Negative  Endocrine ROS: Negative Respiratory ROS: Negative Cardiovascular ROS: Negative Gastrointestinal ROS: Negative Genito-Urinary ROS: Negative Musculoskeletal ROS: Positive for back pain Neurological ROS: Positive for left leg pain, numbness Dermatological ROS: Negative  Blood pressure (!) 150/67, pulse 80, temperature 98.4 F (36.9 C), temperature source Temporal, resp. rate 16, SpO2 99 %. Physical Exam  General appearance: Alert, cooperative, in no acute distress Head: Normocephalic, atraumatic Eyes: Normal, EOM intact Oropharynx: Moist without lesions CV: Regular rate and rhythm Pulm: Clear to  auscultation Back: Tenderness to palpation over the lower midline spine Ext: No edema in LE bilaterally  Neurologic exam:  Mental status: alertness: alert, affect: normal Speech: fluent and clear Motor:strength 5/5 in right hip flexion, knee flexion, knee extension, dorsiflexion and plantarflexion, and the left she has giveaway strength throughout but appears 5 out of 5 at full effort Sensory: Decreased light touch over the lateral ankle and foot on the left side Gait: normal    Imaging: MRI lumbar spine: There is a normal without a curvature. Alignment appears maintained. There is mild degenerative disease noted there is a small disc central protrusion at L5-S1 without any significant stenosis. At L4-5 there is a disc fragment noted in the foramen above the L4-5 disc space This does cause stenosis and impacts the exiting L4 nerve root    Assessment/Plan Left lumbar radiculopathy  Proceed with left L4/5 decompression     Deetta Perla, MD 04/27/2021, 9:50 AM

## 2021-04-27 NOTE — Discharge Instructions (Addendum)
NEUROSURGERY DISCHARGE INSTRUCTIONS  Admission diagnosis: m54.16 lumbar radiculopathy  Operative procedure: Left L4/5 Hemilaminectomy and Decompression  What to do after you leave the hospital:  Recommended diet: regular diet. Increase protein intake to promote wound healing.  Recommended activity: no lifting or strenuous exercise for 4 weeks. You should walk multiple times per day  Special Instructions  No straining, no heavy lifting > 10lbs x 4 weeks.  Keep incision area clean and dry. May shower tomorrow. No baths or pools for 4 weeks.  Please remove dressing tomorrow, no need to apply a bandage afterwards  You have no sutures to remove, the skin is closed with adhesive  Please take pain medications as directed. Take a stool softener if on pain medications   Please Report any of the following: Nausea or Vomiting, Temperature is greater than 101.75F (38.1C) degrees, Dizziness, Abdominal Pain, Difficulty Breathing or Shortness of Breath, Inability to Eat, drink Fluids, or Take medications, Bleeding, swelling, or drainage from surgical incision sites, New numbness or weakness, and Bowel or bladder dysfunction to the neurosurgeon on call at [phone]  Additional Follow up appointments Please follow up with Dr Lacinda Axon in [place] [facility] as scheduled in 2-3 weeks   Please see below for scheduled appointments:  Future Appointments  Date Time Provider [place]  05/19/2021  2:40 PM Jonetta Osgood, NP [facility] None  05/27/2021  3:00 PM Steele Sizer, MD [place] [facility]  06/09/2021  3:15 PM Brendolyn Patty, MD [facility] None     AMBULATORY SURGERY  DISCHARGE INSTRUCTIONS   The drugs that you were given will stay in your system until tomorrow so for the next 24 hours you should not:  Drive an automobile Make any legal decisions Drink any alcoholic beverage   You may resume regular meals tomorrow.  Today it is better to start with liquids and gradually work up to solid  foods.  You may eat anything you prefer, but it is better to start with liquids, then soup and crackers, and gradually work up to solid foods.   Please notify your doctor immediately if you have any unusual bleeding, trouble breathing, redness and pain at the surgery site, drainage, fever, or pain not relieved by medication.    Additional Instructions:        Please contact your physician with any problems or Same Day Surgery at [phone], Monday through Friday 6 am to 4 pm, or [place] at [facility] number at [phone].

## 2021-04-27 NOTE — Anesthesia Preprocedure Evaluation (Signed)
Anesthesia Evaluation  Patient identified by MRN, date of birth, ID band Patient awake    Reviewed: Allergy & Precautions, NPO status , Patient's Chart, lab work & pertinent test results  History of Anesthesia Complications (+) PONV and history of anesthetic complications  Airway Mallampati: III  TM Distance: >3 FB Neck ROM: Full    Dental no notable dental hx.    Pulmonary asthma ,    breath sounds clear to auscultation- rhonchi (-) wheezing      Cardiovascular hypertension, Pt. on medications (-) CAD, (-) Past MI, (-) Cardiac Stents and (-) CABG  Rhythm:Regular Rate:Normal - Systolic murmurs and - Diastolic murmurs    Neuro/Psych neg Seizures PSYCHIATRIC DISORDERS Anxiety Depression    GI/Hepatic Neg liver ROS, GERD  ,  Endo/Other  Diabetes: prediabetic.  Renal/GU Renal disease: hx of nephrolithiasis.     Musculoskeletal  (+) Arthritis , Fibromyalgia -  Abdominal (+) + obese,   Peds  Hematology negative hematology ROS (+)   Anesthesia Other Findings Past Medical History: No date: Allergic rhinitis No date: Anxiety No date: Arthritis No date: Asthma     Comment:  WELL CONTROLLED 05/31/2014: Basal cell carcinoma     Comment:  left spinal mid back No date: Complication of anesthesia     Comment:  PT STATES THAT SHE WAS GIVEN SOME TYPE OF ANESTHESIA               THAT MADE HER HAVE A SEIZURE IN 2015-NOTE IS IN CHART               FROM [place] ANESTHESIA FROM 2015 March 2015: Cystic thyroid nodule     Comment:  10mm on u/s No date: Depression No date: Fibromyalgia No date: GERD (gastroesophageal reflux disease) No date: History of kidney stones     Comment:  SMALL STONE CURRENTLY No date: Hypertension No date: Insomnia 04/2018: Joint pain     Comment:  PT SEEING RHEUMATOLOGIST FOR JOINT PAIN THAT OCCURS APP               ONCE A MONTH AND IS SO BAD THAT SHE CANT GET OUT OF BED No date: Obesity No date:  PONV (postoperative nausea and vomiting) No date: Pre-diabetes 05/26/2017: Right thyroid nodule     Comment:  Solid 1 cm RIGHT lobe July 2018 June 2015: Seizures [facility])     Comment:  provoked during cervical nerve block Aug 2015: Skin cancer     Comment:  removed from back 08/11/2020: Squamous cell carcinoma of skin     Comment:  Right forearm, EDC No date: Vitamin D deficiency disease   Reproductive/Obstetrics                             Anesthesia Physical Anesthesia Plan  ASA: 3  Anesthesia Plan: General   Post-op Pain Management:    Induction: Intravenous  PONV Risk Score and Plan: 3 and Aprepitant, Ondansetron, Dexamethasone and Midazolam  Airway Management Planned: Oral ETT  Additional Equipment:   Intra-op Plan:   Post-operative Plan: Extubation in OR  Informed Consent: I have reviewed the patients History and Physical, chart, labs and discussed the procedure including the risks, benefits and alternatives for the proposed anesthesia with the patient or authorized representative who has indicated his/her understanding and acceptance.     Dental advisory given  Plan Discussed with: CRNA and Anesthesiologist  Anesthesia Plan Comments:  Anesthesia Quick Evaluation  

## 2021-04-27 NOTE — Transfer of Care (Signed)
Immediate Anesthesia Transfer of Care Note  Patient: Yolanda Pace  Procedure(s) Performed: LEFT L4-5 HEMILAMINECTOMY (Left)  Patient Location: PACU  Anesthesia Type:General  Level of Consciousness: awake and drowsy  Airway & Oxygen Therapy: Patient Spontanous Breathing and Patient connected to face mask oxygen  Post-op Assessment: Report given to RN and Post -op Vital signs reviewed and stable  Post vital signs: stable  Last Vitals:  Vitals Value Taken Time  BP 123/64 04/27/21 1215  Temp 37.4 C 04/27/21 1209  Pulse 92 04/27/21 1216  Resp 22 04/27/21 1216  SpO2 95 % 04/27/21 1216  Vitals shown include unvalidated device data.  Last Pain:  Vitals:   04/27/21 1209  TempSrc:   PainSc: Asleep         Complications: No notable events documented.

## 2021-05-04 ENCOUNTER — Other Ambulatory Visit: Payer: Self-pay | Admitting: Family Medicine

## 2021-05-04 DIAGNOSIS — J3089 Other allergic rhinitis: Secondary | ICD-10-CM

## 2021-05-04 DIAGNOSIS — F325 Major depressive disorder, single episode, in full remission: Secondary | ICD-10-CM

## 2021-05-04 DIAGNOSIS — J302 Other seasonal allergic rhinitis: Secondary | ICD-10-CM

## 2021-05-04 DIAGNOSIS — J4520 Mild intermittent asthma, uncomplicated: Secondary | ICD-10-CM

## 2021-05-04 NOTE — Telephone Encounter (Signed)
Requested Prescriptions  Pending Prescriptions Disp Refills  . montelukast (SINGULAIR) 10 MG tablet [Pharmacy Med Name: Montelukast Sodium 10 MG Oral Tablet] 90 tablet 0    Sig: TAKE 1 TABLET BY MOUTH AT BEDTIME     Pulmonology:  Leukotriene Inhibitors Passed - 05/04/2021  1:37 PM      Passed - Valid encounter within last 12 months    Recent Outpatient Visits          2 months ago Well adult exam   [facility] Steele Sizer, MD   6 months ago Connective tissue disease [facility])   [facility] Steele Sizer, MD   10 months ago SOB (shortness of breath)   [facility] [place], Kristeen Miss, PA-C   12 months ago Depression, major, in remission [facility])   [facility] Steele Sizer, MD   1 year ago Depression, major, in remission [facility])   [facility] Steele Sizer, MD      Future Appointments            In 3 weeks Ancil Boozer, Drue Stager, MD [facility], [facility]           . levocetirizine (XYZAL) 5 MG tablet [Pharmacy Med Name: Levocetirizine Dihydrochloride 5 MG Oral Tablet] 90 tablet 0    Sig: TAKE 1 TABLET BY MOUTH ONCE DAILY IN THE EVENING     Ear, Nose, and Throat:  Antihistamines Passed - 05/04/2021  1:37 PM      Passed - Valid encounter within last 12 months    Recent Outpatient Visits          2 months ago Well adult exam   [facility] Steele Sizer, MD   6 months ago Connective tissue disease [facility])   [facility] Steele Sizer, MD   10 months ago SOB (shortness of breath)   [facility] [place], Kristeen Miss, PA-C   12 months ago Depression, major, in remission [facility])   [facility] Steele Sizer, MD   1 year ago Depression, major, in remission [facility])   [facility] Steele Sizer, MD      Future Appointments            In 3 weeks Steele Sizer, MD [facility], [place]           . DULoxetine (CYMBALTA) 60 MG capsule [Pharmacy Med Name: DULoxetine HCl 60 MG Oral Capsule Delayed Release Particles] 90 capsule 0    Sig: Take 1 capsule by mouth once daily     Psychiatry: Antidepressants - SNRI Passed - 05/04/2021  1:37 PM      Passed - Completed PHQ-2 or PHQ-9 in the last 360 days      Passed - Last BP in normal range    BP Readings from Last 1 Encounters:  04/27/21 126/60         Passed - Valid encounter within last 6 months    Recent Outpatient Visits          2 months ago Well adult exam   [facility] Steele Sizer, MD   6 months ago Connective tissue disease [facility])   [facility] Steele Sizer, MD   10 months ago SOB (shortness of breath)   [facility], Kristeen Miss, PA-C   12 months ago Depression, major, in remission [facility])   [facility] Steele Sizer, MD  1 year ago Depression, major, in remission [facility])   [facility] Steele Sizer, MD      Future Appointments            In 3 weeks Ancil Boozer, Drue Stager, MD [facility], [facility]

## 2021-05-05 ENCOUNTER — Other Ambulatory Visit: Payer: Self-pay | Admitting: Obstetrics and Gynecology

## 2021-05-05 DIAGNOSIS — Z1231 Encounter for screening mammogram for malignant neoplasm of breast: Secondary | ICD-10-CM

## 2021-05-11 ENCOUNTER — Ambulatory Visit
Admission: RE | Admit: 2021-05-11 | Discharge: 2021-05-11 | Disposition: A | Discharge status: Home / Self Care | Payer: BC Managed Care – PPO | Source: Ambulatory Visit | Attending: Obstetrics and Gynecology | Admitting: Obstetrics and Gynecology

## 2021-05-11 ENCOUNTER — Other Ambulatory Visit: Payer: Self-pay

## 2021-05-11 DIAGNOSIS — Z1231 Encounter for screening mammogram for malignant neoplasm of breast: Secondary | ICD-10-CM | POA: Diagnosis not present

## 2021-05-13 ENCOUNTER — Ambulatory Visit: Payer: BC Managed Care – PPO | Admitting: *Deleted

## 2021-05-19 ENCOUNTER — Telehealth: Payer: Self-pay

## 2021-05-19 ENCOUNTER — Ambulatory Visit: Payer: BC Managed Care – PPO | Admitting: Nurse Practitioner

## 2021-05-19 NOTE — Telephone Encounter (Signed)
Lmom  regarding pt left message in answering service

## 2021-05-26 NOTE — Progress Notes (Signed)
Name: Yolanda Pace   MRN: [id number]    DOB: 10-20-1966   Date:05/27/2021       Progress Note  Subjective  Chief Complaint  Follow Up  I connected with  Yolanda Pace  on 05/27/21 at  3:00 PM EDT by a video enabled telemedicine application and verified that I am speaking with the correct person using two identifiers.  I discussed the limitations of evaluation and management by telemedicine and the availability of in person appointments. The patient expressed understanding and agreed to proceed with the virtual visit  Staff also discussed with the patient that there may be a patient responsible charge related to this service. Patient Location: at home  Provider Location: [facility] Additional Individuals present: at home   HPI  URI: she developed symptoms on Sunday, initially sneezing, rhinorrhea and head congestion, she did two in home COVID-19 tests that were negative. She states cough is getting worse and having chest congestion, having to use inhaler more often. She is having some SOB even at rest. She had a fever initially but that has subsided . She did not get COVID-19 vaccines  FMS/elevated CRP/Connective Tissue Disease : she was seen by Dr. Jefferson Fuel in the past and we mistakenly sent her to see Dr Kirby Funk of 2020. She states x-rays were done and also labs , inflammatory markers improved and she was given reassurance that is it OA and to continue to monitor.  Takes Duloxetine , takes Flexeril prn only at night. She was diagnosed with PMR by  Dr. 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 We gave her Rybelsus but never started medication, she states at home her glucose has been within normal limits   Depression moderate  she states feeling much better lately, having more  energy, she has been doing mindful exercises and she states it has helped. Taking duloxetine    GERD: controlled with medication, denies heartburn or indigestion   Mild intermittent asthma: seeing Dr. Humphrey Rolls and is off steroid inhaler, uses rescue and also on singulair . She is not on maintenance steroid at this time. Since cold symptoms this past weekend having to use rescue inhaler more often    Chronic back pain with radiculitis: had lumbar laminectomy done by Dr. 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[place]) 02/23/2021   Fibromyalgia 02/20/2021   Myofascial pain dysfunction syndrome 02/20/2021   Osteoarthritis 10/31/2020   Connective tissue disease ([place]) 10/31/2020   Chronic shoulder pain 07/07/2020   Mild intermittent asthma without complication 06/28/4817   Non-intractable vomiting with nausea 07/07/2020   Traumatic incomplete tear of left rotator cuff 04/23/2020   Arthralgia 08/03/2019   Gastric polyp    Benign neoplasm of transverse colon    Internal hemorrhoids    Malignant neoplasm of skin 06/19/2018   Chronic pain syndrome 56/31/4970   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   Kidney stone 02/20/2018   Renal lesion 02/20/2018   Benign hypertension  01/21/2018   Pelvic pain 12/26/2017   Major depression in remission ([place]) 12/26/2017   GERD (gastroesophageal reflux disease) 11/09/2017   Elevated beta-2 microglobulin 07/11/2017   Elevated C-reactive protein (CRP) 07/11/2017   Right thyroid nodule 05/26/2017   Mild hypercholesterolemia 05/06/2017   Generalized osteoarthritis of hand 12/13/2016   Impingement syndrome of shoulder region, left 11/29/2016   Anxiety    Allergic rhinitis    Insomnia    Obesity    Vitamin D deficiency disease      Past Surgical History:  Procedure Laterality Date   ABDOMINAL HYSTERECTOMY  2005ish   fibroids, ovaries remain; along with bladder tack   bladder sling mesh removal  08/2018   bladder tack  2005ish   CHOLECYSTECTOMY     COLONOSCOPY WITH PROPOFOL N/A 06/22/2018   Procedure: COLONOSCOPY WITH PROPOFOL;  Surgeon: Virgel Manifold, MD;  Location: [facility] ENDOSCOPY;  Service: Endoscopy;  Laterality: N/A;   CYSTO WITH HYDRODISTENSION N/A 05/02/2018   Procedure: CYSTOSCOPY/HYDRODISTENSION;  Surgeon: Royston Cowper, MD;  Location: [facility] ORS;  Service: Urology;  Laterality: N/A;   ESOPHAGOGASTRODUODENOSCOPY (EGD) WITH PROPOFOL N/A 06/22/2018   Procedure: ESOPHAGOGASTRODUODENOSCOPY (EGD) WITH PROPOFOL;  Surgeon: Virgel Manifold, MD;  Location: [facility] ENDOSCOPY;  Service: Endoscopy;  Laterality: N/A;   HEMI-MICRODISCECTOMY LUMBAR LAMINECTOMY LEVEL 1 Left 04/27/2021   Procedure: LEFT L4-5 HEMILAMINECTOMY;  Surgeon: Deetta Perla, MD;  Location: [facility] ORS;  Service: Neurosurgery;  Laterality: Left;   INCONTINENCE SURGERY     ROTATOR CUFF REPAIR Left 01/08/2019   rotator cuff revision Left 12/06/2019   SKIN CANCER EXCISION  Aug 2015   removed from back    Family History  Problem Relation Age of Onset   Hyperlipidemia Mother    Diabetes Sister    Hyperlipidemia Sister    Hypertension Sister    Hypertension Brother    Hypertension Son    Kidney Stones Son    Pneumonia Maternal Grandmother    Heart attack Maternal Grandfather    Hypertension Sister    Epilepsy Sister    Hypertension Sister    Hypertension Daughter    Kidney Stones Daughter    Cancer Neg Hx    COPD Neg Hx    Stroke Neg Hx     Social History   Socioeconomic History   Marital status: Married    Spouse name: Not on file   Number of children: 3   Years of education: Not on file   Highest education level: Some college, no degree  Occupational History   Not on file  Tobacco Use   Smoking status: Never   Smokeless  tobacco: Never  Vaping Use   Vaping Use: Never used  Substance and Sexual Activity   Alcohol use: Yes    Comment: occ   Drug use: No   Sexual activity: Yes  Other Topics Concern   Not on file  Social History Narrative   Not on file   Social Determinants of Health   Financial Resource Strain: Low Risk    Difficulty of Paying Living Expenses: Not hard at all  Food Insecurity: No Food Insecurity   Worried About Charity fundraiser in the Last Year: Never true   [place] in the Last Year: Never true  Transportation Needs: No Transportation Needs   Lack of Transportation (Medical): No   Lack of Transportation (Non-Medical): No  Physical Activity: Inactive   Days of Exercise per Week: 0 days   Minutes of Exercise per Session:  0 min  Stress: Stress Concern Present   Feeling of Stress : To some extent  Social Connections: Moderately Integrated   Frequency of Communication with Friends and Family: More than three times a week   Frequency of Social Gatherings with Friends and Family: Once a week   Attends Religious Services: More than 4 times per year   Active Member of [employer] or Organizations: No   Attends Music therapist: Never   Marital Status: Married  Human resources officer Violence: Not At Risk   Fear of Current or Ex-Partner: No   Emotionally Abused: No   Physically Abused: No   Sexually Abused: No     Current Outpatient Medications:    acetaminophen (TYLENOL) 500 MG tablet, Take 1,000 mg by mouth every 8 (eight) hours as needed for moderate pain., Disp: , Rfl:    albuterol (PROVENTIL) (2.5 MG/3ML) 0.083% nebulizer solution, Take 3 mLs (2.5 mg total) by nebulization every 4 (four) hours as needed for wheezing or shortness of breath., Disp: 75 mL, Rfl: 1   albuterol (VENTOLIN HFA) 108 (90 Base) MCG/ACT inhaler, Inhale 2 puffs into the lungs 4 (four) times daily as needed for shortness of breath or wheezing., Disp: , Rfl:    benzonatate (TESSALON) 100 MG  capsule, Take 1-2 capsules (100-200 mg total) by mouth 2 (two) times daily as needed., Disp: 40 capsule, Rfl: 0   blood glucose meter kit and supplies, Dispense based on patient and insurance preference. Use up to four times daily as directed. (FOR ICD-10 E10.9, E11.9)., Disp: 1 each, Rfl: 0   Cholecalciferol (VITAMIN D) 2000 units tablet, Take 2,000 Units by mouth daily. , Disp: , Rfl:    cyclobenzaprine (FLEXERIL) 10 MG tablet, Take 1 tablet (10 mg total) by mouth 3 (three) times daily as needed for muscle spasms., Disp: 30 tablet, Rfl: 0   DULoxetine (CYMBALTA) 60 MG capsule, Take 1 capsule by mouth once daily, Disp: 90 capsule, Rfl: 0   fluticasone-salmeterol (ADVAIR) 100-50 MCG/ACT AEPB, Inhale 1 puff into the lungs 2 (two) times daily., Disp: 1 each, Rfl: 2   ibuprofen (ADVIL) 200 MG tablet, Take 400-600 mg by mouth every 8 (eight) hours as needed for moderate pain., Disp: , Rfl:    levocetirizine (XYZAL) 5 MG tablet, TAKE 1 TABLET BY MOUTH ONCE DAILY IN THE EVENING, Disp: 90 tablet, Rfl: 0   montelukast (SINGULAIR) 10 MG tablet, TAKE 1 TABLET BY MOUTH AT BEDTIME, Disp: 90 tablet, Rfl: 0   olmesartan (BENICAR) 40 MG tablet, Take 1 tablet (40 mg total) by mouth daily. In place of norvasc (Patient taking differently: Take 40 mg by mouth at bedtime. In place of norvasc), Disp: 90 tablet, Rfl: 1   omeprazole (PRILOSEC) 20 MG capsule, Take 20 mg by mouth every morning. , Disp: , Rfl:    traMADol (ULTRAM) 50 MG tablet, Take 50 mg by mouth at bedtime., Disp: , Rfl:   Allergies  Allergen Reactions   Betadine [Povidone Iodine] Swelling, Dermatitis and Rash    Pt states site gets infected   Metrizamide Swelling    Noted in [facility] on 01/06/2012. Premedicate patient prior to contrast media injections.   Other     [place]: vomiting    Salicylic Acid-Sulfur     Burst eye vessels   Bee Pollen     Sneezing, watery eyes, coughing.    Meloxicam Itching   Compazine  [Prochlorperazine] Other  (See Comments)    Jaws locked up   Contrast  Media [Iodinated Diagnostic Agents] Swelling    Noted in [facility] chart on 01/06/2012. Premedicate patient prior to contrast media injections.   Doxycycline Nausea Only    GI upset   Elemental Sulfur Other (See Comments)    Made all blood vessels in eyes burst   Gabapentin Nausea And Vomiting    Lip numbness    Keppra [Levetiracetam]     Pt was not herself    Lyrica [Pregabalin]     Tingling, depression, blister mouth    Rosuvastatin     Mental fogginess, confusion, fatigue    Codeine Nausea And Vomiting   Hydroxychloroquine Nausea And Vomiting and Nausea Only    Ear clicking   Iodine Swelling   Sulfa Antibiotics Nausea And Vomiting and Other (See Comments)    I personally reviewed active problem list, medication list, allergies, family history with the patient/caregiver today.   ROS  Ten systems reviewed and is negative except as mentioned in HPI  Objective  Virtual encounter, vitals not obtained.  There is no height or weight on file to calculate BMI.  Physical Exam  Awake, alert and oriented, cough sounded dry, slight tachypnea, advised to check pulse ox, speaking in full sentences and in no distress   PHQ2/9: Depression screen [facility] 2/9 05/27/2021 02/23/2021 02/20/2021 10/31/2020 08/12/2020  Decreased Interest 0 _0 0  Down, Depressed, Hopeless 0 _1 0  PHQ - 2 Score 0 _2 0  Altered sleeping _3 -  Tired, decreased energy _4 -  Change in appetite 0 0 2 0 -  Feeling bad or failure about yourself  0 1 3 0 -  Trouble concentrating 0 3 3 0 -  Moving slowly or fidgety/restless 0 0 3 0 -  Suicidal thoughts 0 0 0 0 -  PHQ-9 Score _5 -  Difficult doing work/chores - - - Somewhat difficult -  Some recent data might be hidden   PHQ-2/9 Result is negative.    Fall Risk: Fall Risk  05/27/2021 02/23/2021 02/20/2021 10/31/2020 08/12/2020  Falls in the past year? 0 0 0 0 0  Number falls in past yr: 0 0 0 0 -   Injury with Fall? 0 0 0 0 -  Follow up - - - - -    Assessment & Plan  1. Glycosuria  - Hemoglobin A1c  2. Hematuria, unspecified type  - Urinalysis, Complete  3. Mild intermittent asthma with acute exacerbation in adult  - benzonatate (TESSALON) 100 MG capsule; Take 1-2 capsules (100-200 mg total) by mouth 2 (two) times daily as needed.  Dispense: 40 capsule; Refill: 0 - fluticasone-salmeterol (ADVAIR) 100-50 MCG/ACT AEPB; Inhale 1 puff into the lungs 2 (two) times daily.  Dispense: 1 each; Refill: 2  4. Viral upper respiratory tract infection  - benzonatate (TESSALON) 100 MG capsule; Take 1-2 capsules (100-200 mg total) by mouth 2 (two) times daily as needed.  Dispense: 40 capsule; Refill: 0 - fluticasone-salmeterol (ADVAIR) 100-50 MCG/ACT AEPB; Inhale 1 puff into the lungs 2 (two) times daily.  Dispense: 1 each; Refill: 2  5. Connective tissue disease ([place])   6. Dyslipidemia associated with type 2 diabetes mellitus ([place])  - Hemoglobin A1c   I discussed the assessment and treatment plan with the patient. The patient was provided an opportunity to ask questions and all were answered. The patient agreed with the plan and demonstrated an understanding of the instructions.  The patient  was advised to call back or seek an in-person evaluation if the symptoms worsen or if the condition fails to improve as anticipated.  I provided 25  minutes of non-face-to-face time during this encounter.

## 2021-05-27 ENCOUNTER — Telehealth (INDEPENDENT_AMBULATORY_CARE_PROVIDER_SITE_OTHER): Payer: BC Managed Care – PPO | Admitting: Family Medicine

## 2021-05-27 ENCOUNTER — Encounter: Payer: Self-pay | Admitting: Family Medicine

## 2021-05-27 DIAGNOSIS — J4521 Mild intermittent asthma with (acute) exacerbation: Secondary | ICD-10-CM | POA: Diagnosis not present

## 2021-05-27 DIAGNOSIS — R319 Hematuria, unspecified: Secondary | ICD-10-CM

## 2021-05-27 DIAGNOSIS — R81 Glycosuria: Secondary | ICD-10-CM | POA: Diagnosis not present

## 2021-05-27 DIAGNOSIS — J069 Acute upper respiratory infection, unspecified: Secondary | ICD-10-CM | POA: Diagnosis not present

## 2021-05-27 DIAGNOSIS — M359 Systemic involvement of connective tissue, unspecified: Secondary | ICD-10-CM

## 2021-05-27 DIAGNOSIS — E785 Hyperlipidemia, unspecified: Secondary | ICD-10-CM

## 2021-05-27 DIAGNOSIS — E1169 Type 2 diabetes mellitus with other specified complication: Secondary | ICD-10-CM

## 2021-05-27 MED ORDER — FLUTICASONE-SALMETEROL 100-50 MCG/ACT IN AEPB: 1.0000 | INHALATION_SPRAY | Freq: Two times a day (BID) | RESPIRATORY_TRACT | 2 refills | Status: DC

## 2021-05-27 MED ORDER — BENZONATATE 100 MG PO CAPS: 100.0000 mg | ORAL_CAPSULE | Freq: Two times a day (BID) | ORAL | 0 refills | Status: DC | PRN

## 2021-05-29 ENCOUNTER — Ambulatory Visit: Payer: BC Managed Care – PPO | Admitting: *Deleted

## 2021-06-02 LAB — URINALYSIS, COMPLETE
Bacteria, UA: NONE SEEN /HPF
Bilirubin Urine: NEGATIVE
Glucose, UA: NEGATIVE
Hgb urine dipstick: NEGATIVE
Hyaline Cast: NONE SEEN /LPF
Ketones, ur: NEGATIVE
Nitrite: NEGATIVE
Protein, ur: NEGATIVE
RBC / HPF: NONE SEEN /HPF (ref 0–2)
Specific Gravity, Urine: 1.022 (ref 1.001–1.035)
pH: <=5 (ref 5.0–8.0)

## 2021-06-02 LAB — HEMOGLOBIN A1C
Hgb A1c MFr Bld: 5.8 % of total Hgb — ABNORMAL HIGH (ref <5.7)
Mean Plasma Glucose: 120 mg/dL
eAG (mmol/L): 6.6 mmol/L

## 2021-06-05 ENCOUNTER — Encounter: Payer: Self-pay | Admitting: Family Medicine

## 2021-06-08 ENCOUNTER — Other Ambulatory Visit: Payer: Self-pay

## 2021-06-08 DIAGNOSIS — R8290 Unspecified abnormal findings in urine: Secondary | ICD-10-CM

## 2021-06-08 NOTE — Telephone Encounter (Signed)
Copied from [place] [id number]. Topic: Appointment Scheduling - Scheduling Inquiry for Clinic >> Jun 08, 2021  9:48 AM Valere Dross wrote: Reason for CRM: Pt called in needing to get a urine culture, pt has a previous conversation with PCP about getting it done, but only available appts I see are 08/23. Pt requested a call back from a nurse to see about getting squeezed in sooner.

## 2021-06-08 NOTE — Telephone Encounter (Signed)
Pt aware.

## 2021-06-09 ENCOUNTER — Other Ambulatory Visit: Payer: Self-pay

## 2021-06-09 ENCOUNTER — Ambulatory Visit (INDEPENDENT_AMBULATORY_CARE_PROVIDER_SITE_OTHER): Payer: BC Managed Care – PPO | Admitting: Dermatology

## 2021-06-09 DIAGNOSIS — L821 Other seborrheic keratosis: Secondary | ICD-10-CM

## 2021-06-09 DIAGNOSIS — L578 Other skin changes due to chronic exposure to nonionizing radiation: Secondary | ICD-10-CM | POA: Diagnosis not present

## 2021-06-09 DIAGNOSIS — Z85828 Personal history of other malignant neoplasm of skin: Secondary | ICD-10-CM

## 2021-06-09 DIAGNOSIS — Z1283 Encounter for screening for malignant neoplasm of skin: Secondary | ICD-10-CM | POA: Diagnosis not present

## 2021-06-09 DIAGNOSIS — L570 Actinic keratosis: Secondary | ICD-10-CM | POA: Diagnosis not present

## 2021-06-09 DIAGNOSIS — L814 Other melanin hyperpigmentation: Secondary | ICD-10-CM

## 2021-06-09 DIAGNOSIS — D1800 Hemangioma unspecified site: Secondary | ICD-10-CM

## 2021-06-09 DIAGNOSIS — D225 Melanocytic nevi of trunk: Secondary | ICD-10-CM

## 2021-06-09 DIAGNOSIS — D229 Melanocytic nevi, unspecified: Secondary | ICD-10-CM

## 2021-06-09 NOTE — Patient Instructions (Signed)

## 2021-06-09 NOTE — Progress Notes (Signed)
Follow-Up Visit   Subjective  Yolanda Pace is a 55 y.o. female who presents for the following: Annual Exam (Patient here today for fbse. Patient reports a freckle on lip and spot on right cheek she would like checked today. She has history of skin cancer. ).  Patient here for full body skin exam and skin cancer screening.  The following portions of the chart were reviewed this encounter and updated as appropriate:       Objective  Well appearing patient in no apparent distress; mood and affect are within normal limits.  A full examination was performed including scalp, head, eyes, ears, nose, lips, neck, chest, axillae, abdomen, back, buttocks, bilateral upper extremities, bilateral lower extremities, hands, feet, fingers, toes, fingernails, and toenails. All findings within normal limits unless otherwise noted below.  Right Zygoma x 1 Pink scaly macule   left central lower lip 2 mm medium dark brown macule      right buttock 5 mm medium brown macule   Assessment & Plan  Actinic keratosis Right Zygoma x 1  Actinic keratoses are precancerous spots that appear secondary to cumulative UV radiation exposure/sun exposure over time. They are chronic with expected duration over 1 year. A portion of actinic keratoses will progress to squamous cell carcinoma of the skin. It is not possible to reliably predict which spots will progress to skin cancer and so treatment is recommended to prevent development of skin cancer.  Recommend daily broad spectrum sunscreen SPF 30+ to sun-exposed areas, reapply every 2 hours as needed.  Recommend staying in the shade or wearing long sleeves, sun glasses (UVA+UVB protection) and wide brim hats (4-inch brim around the entire circumference of the hat). Call for new or changing lesions.  Destruction of lesion - Right Zygoma x 1  Destruction method: cryotherapy   Informed consent: discussed and consent obtained   Lesion destroyed using liquid  nitrogen: Yes   Region frozen until ice ball extended beyond lesion: Yes   Outcome: patient tolerated procedure well with no complications   Post-procedure details: wound care instructions given   Additional details:  Prior to procedure, discussed risks of blister formation, small wound, skin dyspigmentation, or rare scar following cryotherapy. Recommend Vaseline ointment to treated areas while healing.   Lentigines left central lower lip  Benign-appearing.  Observation.  Call clinic for new or changing lesions.  Recommend daily use of broad spectrum spf 30+ sunscreen to sun-exposed areas. Rec. Lip balm with spf   Nevus right buttock  Benign-appearing.  Observation.  Call clinic for new or changing lesions.  Recommend daily use of broad spectrum spf 30+ sunscreen to sun-exposed areas.   Lentigines - Scattered tan macules 2 mm medium dark brown macule left central lower lip Photo today see above - Due to sun exposure - Benign-appering, observe - Recommend daily broad spectrum sunscreen SPF 30+ to sun-exposed areas, reapply every 2 hours as needed. - Call for any changes  Seborrheic Keratoses - Stuck-on, waxy, tan-brown papules and/or plaques  - Benign-appearing - Discussed benign etiology and prognosis. - Observe - Call for any changes  Melanocytic Nevi - Tan-brown and/or pink-flesh-colored symmetric macules and papules - Benign appearing on exam today - Observation - Call clinic for new or changing moles - Recommend daily use of broad spectrum spf 30+ sunscreen to sun-exposed areas.   Hemangiomas - Red papules - Discussed benign nature - Observe - Call for any changes  Actinic Damage - Chronic condition, secondary to cumulative UV/sun exposure -  diffuse scaly erythematous macules with underlying dyspigmentation - Recommend daily broad spectrum sunscreen SPF 30+ to sun-exposed areas, reapply every 2 hours as needed.  - Staying in the shade or wearing long sleeves, sun  glasses (UVA+UVB protection) and wide brim hats (4-inch brim around the entire circumference of the hat) are also recommended for sun protection.  - Call for new or changing lesions.  History of Squamous Cell Carcinoma of the Skin - No evidence of recurrence today right forehead (2021) - Recommend regular full body skin exams - Recommend daily broad spectrum sunscreen SPF 30+ to sun-exposed areas, reapply every 2 hours as needed.  - Call if any new or changing lesions are noted between office visits  History of Basal Cell Carcinoma of the Skin - No evidence of recurrence today left spinal mid back (2007) - Recommend regular full body skin exams - Recommend daily broad spectrum sunscreen SPF 30+ to sun-exposed areas, reapply every 2 hours as needed.  - Call if any new or changing lesions are noted between office visits  Skin cancer screening performed today.  Return in about 1 year (around 06/09/2022) for tbse. I, Ruthell Rummage, CMA, am acting as scribe for Brendolyn Patty, MD.  Documentation: I have reviewed the above documentation for accuracy and completeness, and I agree with the above.  Brendolyn Patty MD

## 2021-06-10 LAB — URINALYSIS
Bilirubin Urine: NEGATIVE
Glucose, UA: NEGATIVE
Hgb urine dipstick: NEGATIVE
Ketones, ur: NEGATIVE
Nitrite: NEGATIVE
Protein, ur: NEGATIVE
Specific Gravity, Urine: 1.024 (ref 1.001–1.035)
pH: 5.5 (ref 5.0–8.0)

## 2021-06-17 ENCOUNTER — Other Ambulatory Visit: Payer: Self-pay

## 2021-06-17 ENCOUNTER — Encounter: Payer: Self-pay | Admitting: Nurse Practitioner

## 2021-06-17 ENCOUNTER — Ambulatory Visit: Payer: BC Managed Care – PPO | Admitting: Nurse Practitioner

## 2021-06-17 VITALS — BP 142/68 | HR 72 | Temp 98.4°F | Resp 16 | Ht 63.0 in | Wt 178.8 lb

## 2021-06-19 ENCOUNTER — Other Ambulatory Visit (HOSPITAL_COMMUNITY): Payer: Self-pay | Admitting: Neurosurgery

## 2021-06-19 ENCOUNTER — Other Ambulatory Visit: Payer: Self-pay | Admitting: Neurosurgery

## 2021-06-19 DIAGNOSIS — G8929 Other chronic pain: Secondary | ICD-10-CM

## 2021-06-25 ENCOUNTER — Encounter: Payer: Self-pay | Admitting: Family Medicine

## 2021-06-26 ENCOUNTER — Ambulatory Visit (INDEPENDENT_AMBULATORY_CARE_PROVIDER_SITE_OTHER): Payer: BC Managed Care – PPO | Admitting: Nurse Practitioner

## 2021-06-26 ENCOUNTER — Encounter: Payer: Self-pay | Admitting: Nurse Practitioner

## 2021-06-26 ENCOUNTER — Ambulatory Visit
Admission: RE | Admit: 2021-06-26 | Discharge: 2021-06-26 | Disposition: A | Discharge status: Home / Self Care | Payer: BC Managed Care – PPO | Source: Ambulatory Visit | Attending: Neurosurgery | Admitting: Neurosurgery

## 2021-06-26 ENCOUNTER — Other Ambulatory Visit: Payer: Self-pay

## 2021-06-26 VITALS — BP 152/92 | HR 76 | Temp 97.7°F | Resp 16 | Ht 63.0 in | Wt 174.6 lb

## 2021-06-26 DIAGNOSIS — I10 Essential (primary) hypertension: Secondary | ICD-10-CM

## 2021-06-26 DIAGNOSIS — G8929 Other chronic pain: Secondary | ICD-10-CM

## 2021-06-26 DIAGNOSIS — Z8616 Personal history of COVID-19: Secondary | ICD-10-CM

## 2021-06-26 DIAGNOSIS — M5442 Lumbago with sciatica, left side: Secondary | ICD-10-CM

## 2021-06-26 DIAGNOSIS — R0602 Shortness of breath: Secondary | ICD-10-CM | POA: Diagnosis not present

## 2021-06-26 NOTE — Progress Notes (Signed)
[facility] [place], [place] [address]  Internal MEDICINE  Office Visit Note  Patient Name: Yolanda Pace  [id number]  [id number]  Date of Service: 06/26/2021  Chief Complaint  Patient presents with   Follow-up    Lung function   Asthma    HPI Yolanda Pace presents for a follow up visit for residual post-COVID symptoms.She had COVID in August last year and it has impacted her breathing by causing asthma-like symptoms.  She was seen last year after having COVID for continued shortness of breath. Yolanda Pace done in office today. There was a 2-3 % improvement in her FEV1, FVC and FEV1/FVC ratio when compared to her last spiro in January 2022.  She sees Dr. Steele Sizer for primary care. She has been having elevated blood pressure and palpitations. She will be seen by her PCP soon for this issue.  Blood pressure was rechecked, still elevated, see vitals. Patient encouraged to discuss elevated blood pressure with PCP.    Current Medication: Outpatient Encounter Medications as of 06/26/2021  Medication Sig   acetaminophen (TYLENOL) 500 MG tablet Take 1,000 mg by mouth every 8 (eight) hours as needed for moderate pain.   albuterol (PROVENTIL) (2.5 MG/3ML) 0.083% nebulizer solution Take 3 mLs (2.5 mg total) by nebulization every 4 (four) hours as needed for wheezing or shortness of breath.   albuterol (VENTOLIN HFA) 108 (90 Base) MCG/ACT inhaler Inhale 2 puffs into the lungs 4 (four) times daily as needed for shortness of breath or wheezing.   blood glucose meter kit and supplies Dispense based on patient and insurance preference. Use up to four times daily as directed. (FOR ICD-10 E10.9, E11.9).   Cholecalciferol (VITAMIN D) 2000 units tablet Take 2,000 Units by mouth daily.    cyclobenzaprine (FLEXERIL) 10 MG tablet Take 1 tablet (10 mg total) by mouth 3 (three) times daily as needed for muscle spasms.   DULoxetine (CYMBALTA) 60 MG capsule Take 1 capsule by mouth once daily    ibuprofen (ADVIL) 200 MG tablet Take 400-600 mg by mouth every 8 (eight) hours as needed for moderate pain.   levocetirizine (XYZAL) 5 MG tablet TAKE 1 TABLET BY MOUTH ONCE DAILY IN THE EVENING   montelukast (SINGULAIR) 10 MG tablet TAKE 1 TABLET BY MOUTH AT BEDTIME   olmesartan (BENICAR) 40 MG tablet Take 1 tablet (40 mg total) by mouth daily. In place of norvasc (Patient taking differently: Take 40 mg by mouth at bedtime. In place of norvasc)   omeprazole (PRILOSEC) 20 MG capsule Take 20 mg by mouth every morning.    [DISCONTINUED] benzonatate (TESSALON) 100 MG capsule Take 1-2 capsules (100-200 mg total) by mouth 2 (two) times daily as needed. (Patient not taking: Reported on 06/26/2021)   [DISCONTINUED] fluticasone-salmeterol (ADVAIR) 100-50 MCG/ACT AEPB Inhale 1 puff into the lungs 2 (two) times daily. (Patient not taking: Reported on 06/26/2021)   [DISCONTINUED] traMADol (ULTRAM) 50 MG tablet Take 50 mg by mouth at bedtime. (Patient not taking: Reported on 06/26/2021)   No facility-administered encounter medications on file as of 06/26/2021.    Surgical History: Past Surgical History:  Procedure Laterality Date   ABDOMINAL HYSTERECTOMY  2005ish   fibroids, ovaries remain; along with bladder tack   BACK SURGERY     Lower back   bladder sling mesh removal  08/2018   bladder tack  2005ish   CHOLECYSTECTOMY     COLONOSCOPY WITH PROPOFOL N/A 06/22/2018   Procedure: COLONOSCOPY WITH PROPOFOL;  Surgeon: Vonda Antigua  B, MD;  Location: [facility] ENDOSCOPY;  Service: Endoscopy;  Laterality: N/A;   CYSTO WITH HYDRODISTENSION N/A 05/02/2018   Procedure: CYSTOSCOPY/HYDRODISTENSION;  Surgeon: Royston Cowper, MD;  Location: [facility] ORS;  Service: Urology;  Laterality: N/A;   ESOPHAGOGASTRODUODENOSCOPY (EGD) WITH PROPOFOL N/A 06/22/2018   Procedure: ESOPHAGOGASTRODUODENOSCOPY (EGD) WITH PROPOFOL;  Surgeon: Virgel Manifold, MD;  Location: [facility] ENDOSCOPY;  Service: Endoscopy;  Laterality: N/A;    HEMI-MICRODISCECTOMY LUMBAR LAMINECTOMY LEVEL 1 Left 04/27/2021   Procedure: LEFT L4-5 HEMILAMINECTOMY;  Surgeon: Deetta Perla, MD;  Location: [facility] ORS;  Service: Neurosurgery;  Laterality: Left;   INCONTINENCE SURGERY     ROTATOR CUFF REPAIR Left 01/08/2019   rotator cuff revision Left 12/06/2019   SKIN CANCER EXCISION  06/2014   removed from back    Medical History: Past Medical History:  Diagnosis Date   Allergic rhinitis    Anxiety    Arthritis    Asthma    WELL CONTROLLED   Basal cell carcinoma 05/31/2014   left spinal mid back   Complication of anesthesia    PT STATES THAT SHE WAS GIVEN SOME TYPE OF ANESTHESIA THAT MADE HER HAVE A SEIZURE IN 2015-NOTE IS IN CHART FROM [place] ANESTHESIA FROM 2015   Cystic thyroid nodule March 2015   67m on u/s   Depression    Fibromyalgia    GERD (gastroesophageal reflux disease)    History of kidney stones    SMALL STONE CURRENTLY   Hypertension    Insomnia    Joint pain 04/2018   PT SEEING RHEUMATOLOGIST FOR JOINT PAIN THAT OCCURS APP ONCE A MONTH AND IS SO BAD THAT SHE CANT GET OUT OF BED   Obesity    PONV (postoperative nausea and vomiting)    Pre-diabetes    Right thyroid nodule 05/26/2017   Solid 1 cm RIGHT lobe July 2018   Seizures ([place] June 2015   provoked during cervical nerve block   Skin cancer Aug 2015   removed from back   Squamous cell carcinoma of skin 08/11/2020   Right forearm, [facility]   Vitamin D deficiency disease     Family History: Family History  Problem Relation Age of Onset   Hyperlipidemia Mother    Diabetes Sister    Hyperlipidemia Sister    Hypertension Sister    Hypertension Brother    Hypertension Son    Kidney Stones Son    Pneumonia Maternal Grandmother    Heart attack Maternal Grandfather    Hypertension Sister    Epilepsy Sister    Hypertension Sister    Hypertension Daughter    Kidney Stones Daughter    Cancer Neg Hx    COPD Neg Hx    Stroke Neg Hx     Social History    Socioeconomic History   Marital status: Married    Spouse name: Not on file   Number of children: 3   Years of education: Not on file   Highest education level: Some college, no degree  Occupational History   Not on file  Tobacco Use   Smoking status: Never   Smokeless tobacco: Never  Vaping Use   Vaping Use: Never used  Substance and Sexual Activity   Alcohol use: Yes    Comment: occ   Drug use: No   Sexual activity: Yes  Other Topics Concern   Not on file  Social History Narrative   Not on file   Social Determinants of Health   Financial Resource Strain:  Low Risk    Difficulty of Paying Living Expenses: Not hard at all  Food Insecurity: No Food Insecurity   Worried About Running Out of Food in the Last Year: Never true   Ran Out of Food in the Last Year: Never true  Transportation Needs: No Transportation Needs   Lack of Transportation (Medical): No   Lack of Transportation (Non-Medical): No  Physical Activity: Inactive   Days of Exercise per Week: 0 days   Minutes of Exercise per Session: 0 min  Stress: Stress Concern Present   Feeling of Stress : To some extent  Social Connections: Moderately Integrated   Frequency of Communication with Friends and Family: More than three times a week   Frequency of Social Gatherings with Friends and Family: Once a week   Attends Religious Services: More than 4 times per year   Active Member of [employer] or Organizations: No   Attends Music therapist: Never   Marital Status: Married  Human resources officer Violence: Not At Risk   Fear of Current or Ex-Partner: No   Emotionally Abused: No   Physically Abused: No   Sexually Abused: No      Review of Systems  Constitutional:  Negative for chills, fatigue, fever and unexpected weight change.  HENT: Negative.    Respiratory:  Positive for cough, chest tightness (intermittent) and shortness of breath (intermittent). Negative for wheezing.   Cardiovascular:  Positive for  palpitations. Negative for chest pain.  Genitourinary: Negative.   Neurological:  Negative for dizziness, weakness, light-headedness and headaches.   Vital Signs: BP (!) 152/92 Comment: 156/86  Pulse 76   Temp 97.7 F (36.5 C)   Resp 16   Ht 5' 3"  (1.6 m)   Wt 174 lb 9.6 oz (79.2 kg)   SpO2 97%   BMI 30.93 kg/m    Physical Exam Vitals reviewed.  Constitutional:      General: She is not in acute distress.    Appearance: Normal appearance. She is obese. She is not ill-appearing.  HENT:     Head: Normocephalic and atraumatic.  Cardiovascular:     Rate and Rhythm: Normal rate and regular rhythm.     Pulses: Normal pulses.     Heart sounds: Normal heart sounds. No murmur heard.   No friction rub. No gallop.  Pulmonary:     Effort: Pulmonary effort is normal. No respiratory distress.     Breath sounds: Normal breath sounds. No wheezing.  Skin:    General: Skin is warm and dry.     Capillary Refill: Capillary refill takes less than 2 seconds.  Neurological:     Mental Status: She is alert and oriented to person, place, and time.  Psychiatric:        Mood and Affect: Mood normal.        Behavior: Behavior normal.    Assessment/Plan: 1. SOB (shortness of breath) Spito done in office. Improving, uses rescue inhaler and nebulizer as needed. No refills needed, follow up in 6 months.  - Spirometry with Graph  2. History of COVID-19 Residual cough and SOB from COVID in August last year, follow up in 6 months.   3. Essential hypertension Blood pressure elevated, encouraged patient to let her PCP know so that she can evaluate her and adjust her medications if necessary. Patient states she has already contacted her PCP office about schedule an office visit.    General Counseling: Maebry verbalizes understanding of the findings of todays visit and agrees  with plan of treatment. I have discussed any further diagnostic evaluation that may be needed or ordered today. We also reviewed  her medications today. she has been encouraged to call the office with any questions or concerns that should arise related to todays visit.    Orders Placed This Encounter  Procedures   Spirometry with Graph    No orders of the defined types were placed in this encounter.   Return in about 6 months (around 12/27/2021) for F/U, pulmonary only, Aylla Huffine or DSK.   Total time spent:30 Minutes Time spent includes review of chart, medications, test results, and follow up plan with the patient.   [place] Controlled Substance Database was reviewed by me.  This patient was seen by Jonetta Osgood, FNP-C in collaboration with Dr. Clayborn Bigness as a part of collaborative care agreement.   Andrika Peraza R. Valetta Fuller, MSN, FNP-C Internal medicine

## 2021-07-02 ENCOUNTER — Other Ambulatory Visit: Payer: Self-pay

## 2021-07-02 ENCOUNTER — Ambulatory Visit (INDEPENDENT_AMBULATORY_CARE_PROVIDER_SITE_OTHER): Payer: BC Managed Care – PPO | Admitting: Unknown Physician Specialty

## 2021-07-02 ENCOUNTER — Ambulatory Visit: Payer: Self-pay | Admitting: *Deleted

## 2021-07-02 ENCOUNTER — Encounter: Payer: Self-pay | Admitting: Unknown Physician Specialty

## 2021-07-02 VITALS — BP 180/100 | HR 86 | Temp 98.4°F | Resp 14 | Ht 62.0 in | Wt 173.3 lb

## 2021-07-02 DIAGNOSIS — I10 Essential (primary) hypertension: Secondary | ICD-10-CM

## 2021-07-02 DIAGNOSIS — R002 Palpitations: Secondary | ICD-10-CM

## 2021-07-02 LAB — COMPLETE METABOLIC PANEL WITH GFR
AG Ratio: 2 (calc) (ref 1.0–2.5)
ALT: 27 U/L (ref 6–29)
AST: 17 U/L (ref 10–35)
Albumin: 4.3 g/dL (ref 3.6–5.1)
Alkaline phosphatase (APISO): 79 U/L (ref 37–153)
BUN: 15 mg/dL (ref 7–25)
CO2: 28 mmol/L (ref 20–32)
Calcium: 9.7 mg/dL (ref 8.6–10.4)
Chloride: 107 mmol/L (ref 98–110)
Creat: 0.65 mg/dL (ref 0.50–1.03)
Globulin: 2.1 g/dL (calc) (ref 1.9–3.7)
Glucose, Bld: 89 mg/dL (ref 65–99)
Potassium: 4 mmol/L (ref 3.5–5.3)
Sodium: 143 mmol/L (ref 135–146)
Total Bilirubin: 0.5 mg/dL (ref 0.2–1.2)
Total Protein: 6.4 g/dL (ref 6.1–8.1)
eGFR: 104 mL/min/{1.73_m2} (ref >=60)

## 2021-07-02 LAB — CBC WITH DIFFERENTIAL/PLATELET
Absolute Monocytes: 502 cells/uL (ref 200–950)
Basophils Absolute: 81 cells/uL (ref 0–200)
Basophils Relative: 1 %
Eosinophils Absolute: 113 cells/uL (ref 15–500)
Eosinophils Relative: 1.4 %
HCT: 39.8 % (ref 35.0–45.0)
Hemoglobin: 13.4 g/dL (ref 11.7–15.5)
Lymphs Abs: 2268 cells/uL (ref 850–3900)
MCH: 29.7 pg (ref 27.0–33.0)
MCHC: 33.7 g/dL (ref 32.0–36.0)
MCV: 88.2 fL (ref 80.0–100.0)
MPV: 9.7 fL (ref 7.5–12.5)
Monocytes Relative: 6.2 %
Neutro Abs: 5135 cells/uL (ref 1500–7800)
Neutrophils Relative %: 63.4 %
Platelets: 281 10*3/uL (ref 140–400)
RBC: 4.51 10*6/uL (ref 3.80–5.10)
RDW: 11.9 % (ref 11.0–15.0)
Total Lymphocyte: 28 %
WBC: 8.1 10*3/uL (ref 3.8–10.8)

## 2021-07-02 LAB — TSH: TSH: 0.72 mIU/L

## 2021-07-02 MED ORDER — METOPROLOL SUCCINATE ER 50 MG PO TB24: 50.0000 mg | ORAL_TABLET | Freq: Every day | ORAL | 0 refills | Status: DC

## 2021-07-02 NOTE — Addendum Note (Signed)
Addended by: Kathrine Haddock on: 07/02/2021 03:21 PM   Modules accepted: Orders

## 2021-07-02 NOTE — Telephone Encounter (Signed)
Reason for Disposition  [1] Skipped or extra beat(s) AND [2] increases with exercise or exertion  Answer Assessment - Initial Assessment Questions 1. DESCRIPTION: "Please describe your heart rate or heartbeat that you are having" (e.g., fast/slow, regular/irregular, skipped or extra beats, "palpitations")     My heart starting feeling jiggly a few months ago.   Yesterday I went up and down the steps a couple of times.   My heart was pounding and I felt like I was going to pass out.    She put me on new BP medicine recently so I don't know if it's that or something else.   I have an appt for this on 8/29 but I wanted to see if Dr. Ancil Boozer could work me in sooner.    Dr. Ancil Boozer is aware of this problem.   Triage stopped at this point since Dr. Ancil Boozer aware of her problem per pt.   I will send a message to Dr. Ancil Boozer and see if she can be worked in sooner. 2. ONSET: "When did it start?" (Minutes, hours or days)      *No Answer* 3. DURATION: "How long does it last" (e.g., seconds, minutes, hours)     *No Answer* 4. PATTERN "Does it come and go, or has it been constant since it started?"  "Does it get worse with exertion?"   "Are you feeling it now?"     *No Answer* 5. TAP: "Using your hand, can you tap out what you are feeling on a chair or table in front of you, so that I can hear?" (Note: not all patients can do this)       *No Answer* 6. HEART RATE: "Can you tell me your heart rate?" "How many beats in 15 seconds?"  (Note: not all patients can do this)       *No Answer* 7. RECURRENT SYMPTOM: "Have you ever had this before?" If Yes, ask: "When was the last time?" and "What happened that time?"      *No Answer* 8. CAUSE: "What do you think is causing the palpitations?"     *No Answer* 9. CARDIAC HISTORY: "Do you have any history of heart disease?" (e.g., heart attack, angina, bypass surgery, angioplasty, arrhythmia)      *No Answer* 10. OTHER SYMPTOMS: "Do you have any other symptoms?" (e.g.,  dizziness, chest pain, sweating, difficulty breathing)       *No Answer* 11. PREGNANCY: "Is there any chance you are pregnant?" "When was your last menstrual period?"       *No Answer*  Protocols used: Heart Rate and Heartbeat Questions-A-AH

## 2021-07-02 NOTE — Assessment & Plan Note (Signed)
High today.  Start Metoprolol. Encouraged home monitoring.

## 2021-07-02 NOTE — Telephone Encounter (Signed)
Pt has an appt with Dr. Ancil Boozer on 07/13/2021.   She is calling in this morning to see if she can be worked in sooner.   She feels the palpitations are worse.   See triage notes.  I sent a high priority note to [facility] to see if they can work her in sooner with Dr. Ancil Boozer.

## 2021-07-02 NOTE — Progress Notes (Signed)
BP (!) 180/100   Pulse 86   Temp 98.4 F (36.9 C) (Oral)   Resp 14   Ht '5\' 2"'$  (1.575 m)   Wt 173 lb 4.8 oz (78.6 kg)   SpO2 96%   BMI 31.70 kg/m    Subjective:    Patient ID: Yolanda Pace, female    DOB: 04/17/1966, 55 y.o.   MRN: [id number]  HPI: Yolanda Pace is a 55 y.o. female  Chief Complaint  Patient presents with   Palpitations   Pt is here due to heart palpitations associated with light-headedness.   Pt states she is experiencing episodes when laying on back or left side but also happened after walking up around 6 stairs 3 times.  States it happened another time when working out in the yard and it was hot.  This is ongoing for 3 months.  Worried about an episode last night which seemed to last all night.  Some recent weight loss which is purposeful.    Memory changes with Atorvastatin.  Didn't trial Crestor as suggested by Dr.Sowles.  Pt refusing for now  The 10-year ASCVD risk score Mikey Bussing DC Brooke Bonito., et al., 2013) is: 12%   Values used to calculate the score:     Age: 55 years     Sex: Female     Is Non-Hispanic African American: No     Diabetic: Yes     Tobacco smoker: No     Systolic Blood Pressure: 99991111 mmHg     Is BP treated: Yes     HDL Cholesterol: 48 mg/dL     Total Cholesterol: 214 mg/dL   Relevant past medical, surgical, family and social history reviewed and updated as indicated. Interim medical history since our last visit reviewed. Allergies and medications reviewed and updated.  Review of Systems  Per HPI unless specifically indicated above     Objective:    BP (!) 180/100   Pulse 86   Temp 98.4 F (36.9 C) (Oral)   Resp 14   Ht '5\' 2"'$  (1.575 m)   Wt 173 lb 4.8 oz (78.6 kg)   SpO2 96%   BMI 31.70 kg/m   Wt Readings from Last 3 Encounters:  07/02/21 173 lb 4.8 oz (78.6 kg)  06/26/21 174 lb 9.6 oz (79.2 kg)  06/17/21 178 lb 12.8 oz (81.1 kg)    Physical Exam Constitutional:      General: She is not in acute distress.    Appearance:  Normal appearance. She is well-developed.  HENT:     Head: Normocephalic and atraumatic.  Eyes:     General: Lids are normal. No scleral icterus.       Right eye: No discharge.        Left eye: No discharge.     Conjunctiva/sclera: Conjunctivae normal.  Neck:     Vascular: No carotid bruit or JVD.  Cardiovascular:     Rate and Rhythm: Normal rate and regular rhythm.     Heart sounds: Normal heart sounds.  Pulmonary:     Effort: Pulmonary effort is normal. No respiratory distress.     Breath sounds: Normal breath sounds.  Abdominal:     Palpations: There is no hepatomegaly or splenomegaly.  Musculoskeletal:        General: Normal range of motion.     Cervical back: Normal range of motion and neck supple.  Skin:    General: Skin is warm and dry.     Coloration: Skin is  not pale.     Findings: No rash.  Neurological:     Mental Status: She is alert and oriented to person, place, and time.  Psychiatric:        Behavior: Behavior normal.        Thought Content: Thought content normal.        Judgment: Judgment normal.   EKG shong NSR.  No significant changes from EKG from June   Results for orders placed or performed in visit on 06/08/21  Urinalysis  Result Value Ref Range   Color, Urine YELLOW YELLOW   APPearance CLEAR CLEAR   Specific Gravity, Urine 1.024 1.001 - 1.035   pH 5.5 5.0 - 8.0   Glucose, UA NEGATIVE NEGATIVE   Bilirubin Urine NEGATIVE NEGATIVE   Ketones, ur NEGATIVE NEGATIVE   Hgb urine dipstick NEGATIVE NEGATIVE   Protein, ur NEGATIVE NEGATIVE   Nitrite NEGATIVE NEGATIVE   Leukocytes,Ua 1+ (A) NEGATIVE      Assessment & Plan:   Problem List Items Addressed This Visit       Unprioritized   Benign hypertension (Chronic)    High today.  Start Metoprolol. Encouraged home monitoring.        Relevant Medications   metoprolol succinate (TOPROL-XL) 50 MG 24 hr tablet   Other Visit Diagnoses     Palpitations    -  Primary   with hypertension. Rx for  50 mg Metoprolol. To the ER if another episode considering high blood pressure. Appt with cardiology   Relevant Orders   EKG 12-Lead   Ambulatory referral to Cardiology        Follow up plan: Appt already set for 9/29

## 2021-07-13 ENCOUNTER — Ambulatory Visit (INDEPENDENT_AMBULATORY_CARE_PROVIDER_SITE_OTHER): Payer: BC Managed Care – PPO | Admitting: Family Medicine

## 2021-07-13 ENCOUNTER — Encounter: Payer: Self-pay | Admitting: Family Medicine

## 2021-07-13 ENCOUNTER — Ambulatory Visit
Admission: RE | Admit: 2021-07-13 | Discharge: 2021-07-13 | Disposition: A | Discharge status: Home / Self Care | Payer: BC Managed Care – PPO | Source: Ambulatory Visit | Attending: Neurosurgery | Admitting: Neurosurgery

## 2021-07-13 ENCOUNTER — Other Ambulatory Visit: Payer: Self-pay

## 2021-07-13 VITALS — BP 158/90 | HR 106 | Temp 98.3°F | Ht 63.0 in | Wt 168.9 lb

## 2021-07-13 DIAGNOSIS — M5442 Lumbago with sciatica, left side: Secondary | ICD-10-CM | POA: Insufficient documentation

## 2021-07-13 DIAGNOSIS — R002 Palpitations: Secondary | ICD-10-CM | POA: Diagnosis not present

## 2021-07-13 DIAGNOSIS — G8929 Other chronic pain: Secondary | ICD-10-CM | POA: Diagnosis present

## 2021-07-13 MED ORDER — GADOBUTROL 1 MMOL/ML IV SOLN
7.5000 mL | Freq: Once | INTRAVENOUS | Status: AC | PRN
  Administered 2021-07-13: 7.5 mL via INTRAVENOUS

## 2021-07-13 NOTE — Progress Notes (Signed)
   SUBJECTIVE:   CHIEF COMPLAINT / HPI:   PALPITATIONS - seen previously 8/18 for same, normal labs, EKG but with BP 180/100. Started metoprolol XR '50mg'$ . Referred to Cardiology. - reports palpitations are better though persistent. Reports has been under some stress lately.   - no CP, SOB, N/V.  - no h/o CAD, CHF, arrhythmia, thyroid disease. - ongoing since April.  - has cut out all caffeine. - has not heard about Cardiology appt.    OBJECTIVE:   BP (!) 158/90   Pulse (!) 106   Temp 98.3 F (36.8 C)   Ht '5\' 3"'$  (1.6 m)   Wt 168 lb 14.4 oz (76.6 kg)   SpO2 97%   BMI 29.92 kg/m   Gen: well appearing, in NAD Card: Reg rhythm, tachycardic Lungs: CTAB Ext: WWP, no edema   ASSESSMENT/PLAN:   Palpitations Improved. BP and HR improved since initiation of metoprolol but still not at goal. Labs, EKG unrevealing.  Discussed titration of metoprolol however patient hesitant and wants Cardiology eval first. Instructions provided in patient handout. Note sent to referral coordinator to check status of referral. Return and emergency precautions discussed.      Myles Gip, DO

## 2021-07-13 NOTE — Assessment & Plan Note (Signed)
Improved. BP and HR improved since initiation of metoprolol but still not at goal. Labs, EKG unrevealing.  Discussed titration of metoprolol however patient hesitant and wants Cardiology eval first. Instructions provided in patient handout. Note sent to referral coordinator to check status of referral. Return and emergency precautions discussed.

## 2021-07-13 NOTE — Patient Instructions (Addendum)
It was great to see you!  Our plans for today:  - I recommend taking 1.5 tablets of your current metoprolol prescription to reach '75mg'$ .  - I reached out to our referral coordinator to check on the status of your referral. Once I hear from her, I'll let you know.  - Certainly, if you develop new symptoms, especially chest pain, trouble breathing, nausea, vomiting, seek care immediately.   Take care and seek immediate care sooner if you develop any concerns.   Dr. Ky Barban

## 2021-07-29 ENCOUNTER — Other Ambulatory Visit: Payer: Self-pay | Admitting: Family Medicine

## 2021-07-29 DIAGNOSIS — F325 Major depressive disorder, single episode, in full remission: Secondary | ICD-10-CM

## 2021-07-29 DIAGNOSIS — J4520 Mild intermittent asthma, uncomplicated: Secondary | ICD-10-CM

## 2021-07-29 DIAGNOSIS — J302 Other seasonal allergic rhinitis: Secondary | ICD-10-CM

## 2021-07-29 DIAGNOSIS — J3089 Other allergic rhinitis: Secondary | ICD-10-CM

## 2021-07-29 DIAGNOSIS — I10 Essential (primary) hypertension: Secondary | ICD-10-CM

## 2021-08-20 ENCOUNTER — Encounter: Payer: Self-pay | Admitting: Family Medicine

## 2021-09-07 ENCOUNTER — Encounter: Payer: Self-pay | Admitting: Family Medicine

## 2021-09-07 NOTE — Telephone Encounter (Signed)
Please clarify

## 2021-09-08 ENCOUNTER — Encounter: Payer: Self-pay | Admitting: Family Medicine

## 2021-09-08 ENCOUNTER — Ambulatory Visit (INDEPENDENT_AMBULATORY_CARE_PROVIDER_SITE_OTHER): Payer: BC Managed Care – PPO | Admitting: Family Medicine

## 2021-09-08 ENCOUNTER — Other Ambulatory Visit: Payer: Self-pay

## 2021-09-08 VITALS — BP 132/86 | HR 78 | Temp 98.1°F | Resp 16 | Ht 63.0 in | Wt 162.0 lb

## 2021-09-08 DIAGNOSIS — E785 Hyperlipidemia, unspecified: Secondary | ICD-10-CM

## 2021-09-08 DIAGNOSIS — M797 Fibromyalgia: Secondary | ICD-10-CM

## 2021-09-08 DIAGNOSIS — J3089 Other allergic rhinitis: Secondary | ICD-10-CM | POA: Diagnosis not present

## 2021-09-08 DIAGNOSIS — E1169 Type 2 diabetes mellitus with other specified complication: Secondary | ICD-10-CM

## 2021-09-08 DIAGNOSIS — F325 Major depressive disorder, single episode, in full remission: Secondary | ICD-10-CM

## 2021-09-08 DIAGNOSIS — M359 Systemic involvement of connective tissue, unspecified: Secondary | ICD-10-CM

## 2021-09-08 DIAGNOSIS — G894 Chronic pain syndrome: Secondary | ICD-10-CM

## 2021-09-08 DIAGNOSIS — J4520 Mild intermittent asthma, uncomplicated: Secondary | ICD-10-CM

## 2021-09-08 DIAGNOSIS — J302 Other seasonal allergic rhinitis: Secondary | ICD-10-CM

## 2021-09-08 DIAGNOSIS — M159 Polyosteoarthritis, unspecified: Secondary | ICD-10-CM

## 2021-09-08 DIAGNOSIS — E559 Vitamin D deficiency, unspecified: Secondary | ICD-10-CM

## 2021-09-08 DIAGNOSIS — R202 Paresthesia of skin: Secondary | ICD-10-CM | POA: Diagnosis not present

## 2021-09-08 DIAGNOSIS — M150 Primary generalized (osteo)arthritis: Secondary | ICD-10-CM

## 2021-09-08 DIAGNOSIS — I10 Essential (primary) hypertension: Secondary | ICD-10-CM

## 2021-09-08 LAB — POCT GLYCOSYLATED HEMOGLOBIN (HGB A1C): Hemoglobin A1C: 5.5 % (ref 4.0–5.6)

## 2021-09-08 MED ORDER — MONTELUKAST SODIUM 10 MG PO TABS: 10.0000 mg | ORAL_TABLET | Freq: Every day | ORAL | 1 refills | Status: DC

## 2021-09-08 MED ORDER — CELECOXIB 100 MG PO CAPS: 100.0000 mg | ORAL_CAPSULE | Freq: Every day | ORAL | 0 refills | Status: DC

## 2021-09-08 MED ORDER — DULOXETINE HCL 60 MG PO CPEP: 60.0000 mg | ORAL_CAPSULE | Freq: Every day | ORAL | 1 refills | Status: DC

## 2021-09-08 MED ORDER — OLMESARTAN MEDOXOMIL 40 MG PO TABS: 40.0000 mg | ORAL_TABLET | Freq: Every day | ORAL | 1 refills | Status: DC

## 2021-09-08 MED ORDER — LEVOCETIRIZINE DIHYDROCHLORIDE 5 MG PO TABS: 5.0000 mg | ORAL_TABLET | Freq: Every evening | ORAL | 1 refills | Status: DC

## 2021-09-08 NOTE — Progress Notes (Signed)
Name: Yolanda Pace   MRN: [id number]    DOB: [birth year]   Date:09/08/2021       Progress Note  Subjective  Chief Complaint  Follow Up  HPI  FMS/elevated CRP/Connective Tissue Disease : she was seen by Dr. Jefferson Fuel in the past She states x-rays were done and also labs , inflammatory markers improved and she was given reassurance that is it OA and to continue to monitor.  Takes Duloxetine , takes Flexeril prn only at night for muscle spasms . 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 Discussed importance of statin therapy, but she refused again today . She lost 6 lbs since August . She states no longer drinking sodas or snacking anymore   Depression moderate  she states feeling much better lately, having more energy, she has been doing mindful exercises and she states it has helped. She was very anxious recently and had palpitation - states home stress but doing better now Taking duloxetine    GERD: controlled with medication, denies heartburn or indigestion . Taking Omeprazole  Palpitation: seen by Dr. Ky Barban and was given metoprolol but she stopped taking it, episodes are seldom now, she has a follow up coming up with cardiologist   Mild intermittent asthma: seeing Dr. Humphrey Rolls and is off steroid inhaler, uses rescue and also on singulair . She is not on maintenance steroid at this time. She states last spirometry after covid and states it has improved some but not back to baseline    Chronic back pain with radiculitis: had lumbar laminectomy done by Dr. Lacinda Axon June 2022 she was on tramadol after surgery  but is off medications now, taking flexeril prn only. No bowel or bladder incontinence. She has paresthesias of hands and feet, states chronic and would like b12 checked. She states symptoms did not improve after surgery   Patient Active Problem List   Diagnosis Date Noted   Palpitations 07/13/2021   Diabetes mellitus with microalbuminuria ([place]) 02/23/2021   Fibromyalgia 02/20/2021   Myofascial pain dysfunction syndrome 02/20/2021   Osteoarthritis 10/31/2020   Connective tissue disease ([place]) 10/31/2020   Chronic shoulder pain 07/07/2020   Mild intermittent asthma without complication 41/32/4401   Non-intractable vomiting with nausea 07/07/2020   Traumatic incomplete tear of left rotator cuff 04/23/2020   Arthralgia 08/03/2019   Gastric polyp    Benign neoplasm of transverse colon    Internal hemorrhoids    Malignant neoplasm of skin 06/19/2018   Chronic pain syndrome 02/72/5366   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   Kidney stone 02/20/2018   Renal lesion 02/20/2018   Benign hypertension 01/21/2018   Pelvic pain 12/26/2017   Major depression in remission ([place]) 12/26/2017   GERD (gastroesophageal reflux disease) 11/09/2017   Elevated beta-2 microglobulin 07/11/2017   Elevated C-reactive protein (CRP) 07/11/2017   Right thyroid nodule 05/26/2017   Mild hypercholesterolemia 05/06/2017   Generalized osteoarthritis of hand 12/13/2016   Impingement syndrome of shoulder region, left 11/29/2016  Anxiety    Allergic rhinitis    Insomnia    Obesity    Vitamin D deficiency disease     Past Surgical History:  Procedure Laterality Date   ABDOMINAL HYSTERECTOMY  2005ish   fibroids, ovaries remain; along with bladder tack   BACK SURGERY     Lower back   bladder sling mesh removal  08/2018   bladder tack  2005ish   CHOLECYSTECTOMY     COLONOSCOPY WITH PROPOFOL N/A 06/22/2018   Procedure: COLONOSCOPY WITH PROPOFOL;  Surgeon: Virgel Manifold, MD;   Location: [facility];  Service: Endoscopy;  Laterality: N/A;   CYSTO WITH HYDRODISTENSION N/A 05/02/2018   Procedure: CYSTOSCOPY/HYDRODISTENSION;  Surgeon: Royston Cowper, MD;  Location: [facility];  Service: Urology;  Laterality: N/A;   ESOPHAGOGASTRODUODENOSCOPY (EGD) WITH PROPOFOL N/A 06/22/2018   Procedure: ESOPHAGOGASTRODUODENOSCOPY (EGD) WITH PROPOFOL;  Surgeon: Virgel Manifold, MD;  Location: [facility];  Service: Endoscopy;  Laterality: N/A;   HEMI-MICRODISCECTOMY LUMBAR LAMINECTOMY LEVEL 1 Left 04/27/2021   Procedure: LEFT L4-5 HEMILAMINECTOMY;  Surgeon: Deetta Perla, MD;  Location: [facility];  Service: Neurosurgery;  Laterality: Left;   INCONTINENCE SURGERY     ROTATOR CUFF REPAIR Left 01/08/2019   rotator cuff revision Left 12/06/2019   SKIN CANCER EXCISION  06/2014   removed from back    Family History  Problem Relation Age of Onset   Hyperlipidemia Mother    Diabetes Sister    Hyperlipidemia Sister    Hypertension Sister    Hypertension Brother    Hypertension Son    Kidney Stones Son    Pneumonia Maternal Grandmother    Heart attack Maternal Grandfather    Hypertension Sister    Epilepsy Sister    Hypertension Sister    Hypertension Daughter    Kidney Stones Daughter    Cancer Neg Hx    COPD Neg Hx    Stroke Neg Hx     Social History   Tobacco Use   Smoking status: Never   Smokeless tobacco: Never  Substance Use Topics   Alcohol use: Yes    Comment: occ     Current Outpatient Medications:    acetaminophen (TYLENOL) 500 MG tablet, Take 1,000 mg by mouth every 8 (eight) hours as needed for moderate pain., Disp: , Rfl:    albuterol (PROVENTIL) (2.5 MG/3ML) 0.083% nebulizer solution, Take 3 mLs (2.5 mg total) by nebulization every 4 (four) hours as needed for wheezing or shortness of breath., Disp: 75 mL, Rfl: 1   albuterol (VENTOLIN HFA) 108 (90 Base) MCG/ACT inhaler, Inhale 2 puffs into the lungs 4 (four) times daily as needed for shortness of  breath or wheezing., Disp: , Rfl:    blood glucose meter kit and supplies, Dispense based on patient and insurance preference. Use up to four times daily as directed. (FOR ICD-10 E10.9, E11.9)., Disp: 1 each, Rfl: 0   Cholecalciferol (VITAMIN D) 2000 units tablet, Take 2,000 Units by mouth daily. , Disp: , Rfl:    cyclobenzaprine (FLEXERIL) 10 MG tablet, Take 1 tablet (10 mg total) by mouth 3 (three) times daily as needed for muscle spasms., Disp: 30 tablet, Rfl: 0   DULoxetine (CYMBALTA) 60 MG capsule, Take 1 capsule by mouth once daily, Disp: 30 capsule, Rfl: 0   ibuprofen (ADVIL) 200 MG tablet, Take 400-600 mg by mouth every 8 (eight) hours as needed for moderate pain., Disp: , Rfl:    levocetirizine (XYZAL) 5 MG tablet, TAKE 1 TABLET BY MOUTH ONCE  DAILY IN THE EVENING, Disp: 30 tablet, Rfl: 0   montelukast (SINGULAIR) 10 MG tablet, TAKE 1 TABLET BY MOUTH AT BEDTIME, Disp: 30 tablet, Rfl: 0   olmesartan (BENICAR) 40 MG tablet, Take 1 tablet by mouth once daily, Disp: 30 tablet, Rfl: 0   omeprazole (PRILOSEC) 20 MG capsule, Take 20 mg by mouth every morning. , Disp: , Rfl:    metoprolol succinate (TOPROL-XL) 50 MG 24 hr tablet, Take 1 tablet (50 mg total) by mouth daily. Take with or immediately following a meal. (Patient not taking: Reported on 09/08/2021), Disp: 30 tablet, Rfl: 0  Allergies  Allergen Reactions   Betadine [Povidone Iodine] Swelling, Dermatitis and Rash    Pt states site gets infected   Metrizamide Swelling    Noted in [facility] chart on 01/06/2012. Premedicate patient prior to contrast media injections.   Other     [place]: vomiting    Salicylic Acid-Sulfur     Burst eye vessels   Bee Pollen     Sneezing, watery eyes, coughing.    Meloxicam Itching   Pollen Extract Other (See Comments)    Sneezing, watery eyes, coughing.    Sulindac Anxiety and Other (See Comments)   Compazine  [Prochlorperazine] Other (See Comments)    Jaws locked up   Contrast Media [Iodinated  Diagnostic Agents] Swelling    Noted in [facility]. chart on 01/06/2012. Premedicate patient prior to contrast media injections.   Doxycycline Nausea Only    GI upset   Elemental Sulfur Other (See Comments)    Made all blood vessels in eyes burst   Gabapentin Nausea And Vomiting    Lip numbness    Keppra [Levetiracetam]     Pt was not herself    Lyrica [Pregabalin]     Tingling, depression, blister mouth    Rosuvastatin     Mental fogginess, confusion, fatigue    Codeine Nausea And Vomiting   Hydroxychloroquine Nausea And Vomiting and Nausea Only    Ear clicking   Iodine Swelling   Sulfa Antibiotics Nausea And Vomiting and Other (See Comments)    I personally reviewed active problem list, medication list, allergies, family history, social history, health maintenance with the patient/caregiver today.   ROS  Constitutional: Negative for fever, positive for mild  weight change.  Respiratory: Negative for cough and shortness of breath.   Cardiovascular: Negative for chest pain , she has occasional palpitations.  Gastrointestinal: Negative for abdominal pain, no bowel changes.  Musculoskeletal: Negative for gait problem she has  joint swelling.  Skin: Negative for rash.  Neurological: Negative for dizziness or headache.  No other specific complaints in a complete review of systems (except as listed in HPI above).   Objective  Vitals:   09/08/21 1055  BP: 132/86  Pulse: 78  Resp: 16  Temp: 98.1 F (36.7 C)  SpO2: 96%  Weight: 162 lb (73.5 kg)  Height: _0  (1.6 m)    Body mass index is 28.7 kg/m.  Physical Exam  Constitutional: Patient appears well-developed and well-nourished. Overweight.  No distress.  HEENT: head atraumatic, normocephalic, pupils equal and reactive to light, neck supple Cardiovascular: Normal rate, regular rhythm and normal heart sounds.  No murmur heard. No BLE edema. Pulmonary/Chest: Effort normal and breath sounds normal. No respiratory  distress. Abdominal: Soft.  There is no tenderness. Muscular Skeletal: no redness or increase in warmth, enlargement of both thumbs  Psychiatric: Patient has a normal mood and affect. behavior is normal. Judgment and  thought content normal.   Recent Results (from the past 2160 hour(s))  CBC with Differential/Platelet     Status: None   Collection Time: 07/02/21  3:24 PM  Result Value Ref Range   WBC 8.1 3.8 - 10.8 Thousand/uL   RBC 4.51 3.80 - 5.10 Million/uL   Hemoglobin 13.4 11.7 - 15.5 g/dL   HCT 39.8 35.0 - 45.0 %   MCV 88.2 80.0 - 100.0 fL   MCH 29.7 27.0 - 33.0 pg   MCHC 33.7 32.0 - 36.0 g/dL   RDW 11.9 11.0 - 15.0 %   Platelets 281 140 - 400 Thousand/uL   MPV 9.7 7.5 - 12.5 fL   Neutro Abs 5,135 1,500 - 7,800 cells/uL   Lymphs Abs 2,268 850 - 3,900 cells/uL   Absolute Monocytes 502 200 - 950 cells/uL   Eosinophils Absolute 113 15 - 500 cells/uL   Basophils Absolute 81 0 - 200 cells/uL   Neutrophils Relative % 63.4 %   Total Lymphocyte 28.0 %   Monocytes Relative 6.2 %   Eosinophils Relative 1.4 %   Basophils Relative 1.0 %  COMPLETE METABOLIC PANEL WITH GFR     Status: None   Collection Time: 07/02/21  3:24 PM  Result Value Ref Range   Glucose, Bld 89 65 - 99 mg/dL    Comment: .            Fasting reference interval .    BUN 15 7 - 25 mg/dL   Creat 0.65 0.50 - 1.03 mg/dL   eGFR 104 > OR = 60 mL/min/1.23m    Comment: The eGFR is based on the CKD-EPI 2021 equation. To calculate  the new eGFR from a previous Creatinine or Cystatin C result, go to https://www.kidney.org/professionals/ kdoqi/gfr%5Fcalculator    BUN/Creatinine Ratio NOT APPLICABLE 6 - 22 (calc)   Sodium 143 135 - 146 mmol/L   Potassium 4.0 3.5 - 5.3 mmol/L   Chloride 107 98 - 110 mmol/L   CO2 28 20 - 32 mmol/L   Calcium 9.7 8.6 - 10.4 mg/dL   Total Protein 6.4 6.1 - 8.1 g/dL   Albumin 4.3 3.6 - 5.1 g/dL   Globulin 2.1 1.9 - 3.7 g/dL (calc)   AG Ratio 2.0 1.0 - 2.5 (calc)   Total Bilirubin 0.5  0.2 - 1.2 mg/dL   Alkaline phosphatase (APISO) 79 37 - 153 U/L   AST 17 10 - 35 U/L   ALT 27 6 - 29 U/L  TSH     Status: None   Collection Time: 07/02/21  3:24 PM  Result Value Ref Range   TSH 0.72 mIU/L    Comment:           Reference Range .           > or = 20 Years  0.40-4.50 .                Pregnancy Ranges           First trimester    0.26-2.66           Second trimester   0.55-2.73           Third trimester    0.43-2.91      PHQ2/9: Depression screen [facility] 09/08/2021 07/13/2021 07/02/2021 06/17/2021 05/27/2021  Decreased Interest 0 1 0 0 0  Down, Depressed, Hopeless 0 1 0 0 0  PHQ - 2 Score 0 2 0 0 0  Altered sleeping 0 0 0 - 1  Tired, decreased energy 0 0 0 - 1  Change in appetite 0 0 0 - 0  Feeling bad or failure about yourself  0 0 0 - 0  Trouble concentrating 0 0 0 - 0  Moving slowly or fidgety/restless 0 0 0 - 0  Suicidal thoughts 0 0 0 - 0  PHQ-9 Score 0 2 0 - 2  Difficult doing work/chores - Not difficult at all - - -  Some recent data might be hidden    phq 9 is negative   Fall Risk: Fall Risk  09/08/2021 07/13/2021 07/02/2021 06/17/2021 05/27/2021  Falls in the past year? 0 0 0 0 0  Number falls in past yr: 0 0 0 - 0  Injury with Fall? 0 0 0 - 0  Risk for fall due to : No Fall Risks - - No Fall Risks -  Follow up Falls prevention discussed - Falls evaluation completed Falls evaluation completed -      Functional Status Survey: Is the patient deaf or have difficulty hearing?: No Does the patient have difficulty seeing, even when wearing glasses/contacts?: No Does the patient have difficulty concentrating, remembering, or making decisions?: No Does the patient have difficulty walking or climbing stairs?: No Does the patient have difficulty dressing or bathing?: No Does the patient have difficulty doing errands alone such as visiting a doctor's office or shopping?: No    Assessment & Plan  1. Dyslipidemia associated with type 2 diabetes mellitus  (HCC)  - POCT HgB A1C  2. Paresthesia  - B12 and Folate Panel  3. Depression, major, in remission ([place])  - DULoxetine (CYMBALTA) 60 MG capsule; Take 1 capsule (60 mg total) by mouth daily.  Dispense: 90 capsule; Refill: 1  4. Perennial allergic rhinitis with seasonal variation  - levocetirizine (XYZAL) 5 MG tablet; Take 1 tablet (5 mg total) by mouth every evening.  Dispense: 90 tablet; Refill: 1 - montelukast (SINGULAIR) 10 MG tablet; Take 1 tablet (10 mg total) by mouth at bedtime.  Dispense: 90 tablet; Refill: 1  5. Asthma, mild intermittent, well-controlled  - montelukast (SINGULAIR) 10 MG tablet; Take 1 tablet (10 mg total) by mouth at bedtime.  Dispense: 90 tablet; Refill: 1  6. Benign hypertension  - olmesartan (BENICAR) 40 MG tablet; Take 1 tablet (40 mg total) by mouth daily.  Dispense: 90 tablet; Refill: 1  7. Fibromyalgia  - celecoxib (CELEBREX) 100 MG capsule; Take 1 capsule (100 mg total) by mouth daily.  Dispense: 30 capsule; Refill: 0  8. Chronic pain syndrome  - celecoxib (CELEBREX) 100 MG capsule; Take 1 capsule (100 mg total) by mouth daily.  Dispense: 30 capsule; Refill: 0  9. Connective tissue disease (HCC)  - celecoxib (CELEBREX) 100 MG capsule; Take 1 capsule (100 mg total) by mouth daily.  Dispense: 30 capsule; Refill: 0  10. Vitamin D deficiency disease   11. Primary osteoarthritis involving multiple joints  - celecoxib (CELEBREX) 100 MG capsule; Take 1 capsule (100 mg total) by mouth daily.  Dispense: 30 capsule; Refill: 0

## 2021-09-08 NOTE — Telephone Encounter (Signed)
Her appt is next Tuesday 09/15/21. You dont have anything available before then

## 2021-09-09 LAB — B12 AND FOLATE PANEL
Folate: 10.8 ng/mL
Vitamin B-12: 425 pg/mL (ref 200–1100)

## 2021-09-15 ENCOUNTER — Ambulatory Visit: Payer: BC Managed Care – PPO | Admitting: Family Medicine

## 2021-09-15 ENCOUNTER — Ambulatory Visit: Payer: BC Managed Care – PPO | Admitting: Cardiovascular Disease

## 2021-09-17 ENCOUNTER — Encounter: Payer: Self-pay | Admitting: Dermatology

## 2021-09-18 LAB — HM DIABETES EYE EXAM

## 2021-09-22 ENCOUNTER — Other Ambulatory Visit: Payer: Self-pay

## 2021-09-22 ENCOUNTER — Encounter: Payer: Self-pay | Admitting: Cardiovascular Disease

## 2021-09-22 ENCOUNTER — Ambulatory Visit (INDEPENDENT_AMBULATORY_CARE_PROVIDER_SITE_OTHER): Payer: BC Managed Care – PPO | Admitting: Cardiovascular Disease

## 2021-09-22 VITALS — BP 120/82 | HR 72 | Ht 63.0 in | Wt 162.0 lb

## 2021-09-22 DIAGNOSIS — I10 Essential (primary) hypertension: Secondary | ICD-10-CM | POA: Diagnosis not present

## 2021-09-22 DIAGNOSIS — F419 Anxiety disorder, unspecified: Secondary | ICD-10-CM

## 2021-09-22 DIAGNOSIS — R002 Palpitations: Secondary | ICD-10-CM

## 2021-09-22 DIAGNOSIS — E7800 Pure hypercholesterolemia, unspecified: Secondary | ICD-10-CM | POA: Diagnosis not present

## 2021-09-22 DIAGNOSIS — R809 Proteinuria, unspecified: Secondary | ICD-10-CM

## 2021-09-22 DIAGNOSIS — F325 Major depressive disorder, single episode, in full remission: Secondary | ICD-10-CM

## 2021-09-22 DIAGNOSIS — E1129 Type 2 diabetes mellitus with other diabetic kidney complication: Secondary | ICD-10-CM | POA: Diagnosis not present

## 2021-09-22 NOTE — Patient Instructions (Addendum)
Medication Instructions:  No changes  Metoprolol succinate 1/2 pill (25 mg) only as needed for palpitations (no new script needed)  If you need a refill on your cardiac medications before your next appointment, please call your pharmacy.   Lab work: No new labs needed  Testing/Procedures: No new testing needed  Follow-Up: At [facility], you and your health needs are our priority.  As part of our continuing mission to provide you with exceptional heart care, we have created designated Provider Care Teams.  These Care Teams include your primary Cardiologist (physician) and Advanced Practice Providers (APPs -  Physician Assistants and Nurse Practitioners) who all work together to provide you with the care you need, when you need it.  You will need a follow up appointment as needed  Providers on your designated Care Team:   Murray Hodgkins, NP Christell Faith, PA-C Cadence Kathlen Mody, [place]  COVID-19 Vaccine Information can be found at: [email or web address].uk For questions related to vaccine distribution or appointments, please email [email or web address] or call [phone].

## 2021-09-22 NOTE — Progress Notes (Addendum)
Cardiology Office Note  Date:  09/23/2021   ID:  KRUPA Pace, DOB 29-Dec-1965, MRN [id number]  PCP:  Steele Sizer, MD   Chief Complaint  Patient presents with   New Patient (Initial Visit)    New patient to establish care with provider for palpitations x 6 months and seem to be worse when she lie on her left side. The palpitations has not changed since they began 6 months ago. Medications verbally reviewed with patient.     HPI:  [name] is a 55 year old woman with past medical history of Diabetes Chronic pain, arthralgias, back pain Connective tissue disease, osteoarthritis Depression GERD Who presents by referral from Yolanda Pace for consultation of her palpitations  Seen by primary care July 13, 2021 Had symptoms of palpitations  Notes indicating similar symptoms  07/02/21 EKG stable at that time, but with BP 180/100.  Started metoprolol XR 37m.  Palpitations somewhat better but still persistent Stopped the metoprolol   Reported stress/anxiety Might lose job  Not on metoprolol succinate as prescribed by primary care  Lab work reviewed  A1c  5.5 Total [place] all as high as 231, most recently 214 with LDL 128 Would prefer not to be on a statin  Palpitations when laying back, on left side in bed Concerned it could be from stress as detailed above  EKG personally reviewed by myself on todays visit Normal sinus rhythm rate 72 bpm no significant ST-T wave changes  CT scan March 2019 reviewed no significant aortic atherosclerosis   PMH:   has a past medical history of Allergic rhinitis, Anxiety, Arthritis, Asthma, Basal cell carcinoma (041/[id number]/3094, Complication of anesthesia, Cystic thyroid nodule (March 2015), Depression, Fibromyalgia, GERD (gastroesophageal reflux disease), History of kidney stones, Hypertension, Insomnia, Joint pain (04/2018), Obesity, PONV (postoperative nausea and vomiting), Pre-diabetes, Right thyroid nodule (05/26/2017), Seizures ([place]  (June 2015), Skin cancer (Aug 2015), Squamous cell carcinoma of skin (08/11/2020), and Vitamin D deficiency disease.  PSH:    Past Surgical History:  Procedure Laterality Date   ABDOMINAL HYSTERECTOMY  2005ish   fibroids, ovaries remain; along with bladder tack   BACK SURGERY     Lower back   bladder sling mesh removal  08/2018   bladder tack  2005ish   CHOLECYSTECTOMY     COLONOSCOPY WITH PROPOFOL N/A 06/22/2018   Procedure: COLONOSCOPY WITH PROPOFOL;  Surgeon: TVirgel Manifold MD;  Location: [facility] ENDOSCOPY;  Service: Endoscopy;  Laterality: N/A;   CYSTO WITH HYDRODISTENSION N/A 05/02/2018   Procedure: CYSTOSCOPY/HYDRODISTENSION;  Surgeon: WRoyston Cowper MD;  Location: [facility] ORS;  Service: Urology;  Laterality: N/A;   ESOPHAGOGASTRODUODENOSCOPY (EGD) WITH PROPOFOL N/A 06/22/2018   Procedure: ESOPHAGOGASTRODUODENOSCOPY (EGD) WITH PROPOFOL;  Surgeon: TVirgel Manifold MD;  Location: [facility] ENDOSCOPY;  Service: Endoscopy;  Laterality: N/A;   HEMI-MICRODISCECTOMY LUMBAR LAMINECTOMY LEVEL 1 Left 04/27/2021   Procedure: LEFT L4-5 HEMILAMINECTOMY;  Surgeon: CDeetta Perla MD;  Location: [facility] ORS;  Service: Neurosurgery;  Laterality: Left;   INCONTINENCE SURGERY     ROTATOR CUFF REPAIR Left 01/08/2019   rotator cuff revision Left 12/06/2019   SKIN CANCER EXCISION  06/2014   removed from back    Current Outpatient Medications  Medication Sig Dispense Refill   acetaminophen (TYLENOL) 500 MG tablet Take 1,000 mg by mouth every 8 (eight) hours as needed for moderate pain.     albuterol (PROVENTIL) (2.5 MG/3ML) 0.083% nebulizer solution Take 3 mLs (2.5 mg total) by nebulization every 4 (four) hours as needed for wheezing or  shortness of breath. 75 mL 1   albuterol (VENTOLIN HFA) 108 (90 Base) MCG/ACT inhaler Inhale 2 puffs into the lungs 4 (four) times daily as needed for shortness of breath or wheezing.     blood glucose meter kit and supplies Dispense based on patient and insurance  preference. Use up to four times daily as directed. (FOR ICD-10 E10.9, E11.9). 1 each 0   Cholecalciferol (VITAMIN D) 2000 units tablet Take 2,000 Units by mouth daily.      DULoxetine (CYMBALTA) 20 MG capsule Take 20 mg by mouth daily.     DULoxetine (CYMBALTA) 30 MG capsule Take 30 mg by mouth daily.     DULoxetine (CYMBALTA) 60 MG capsule Take 1 capsule (60 mg total) by mouth daily. 90 capsule 1   ibuprofen (ADVIL) 200 MG tablet Take 400-600 mg by mouth every 8 (eight) hours as needed for moderate pain.     levocetirizine (XYZAL) 5 MG tablet Take 1 tablet (5 mg total) by mouth every evening. 90 tablet 1   montelukast (SINGULAIR) 10 MG tablet Take 1 tablet (10 mg total) by mouth at bedtime. 90 tablet 1   olmesartan (BENICAR) 40 MG tablet Take 1 tablet (40 mg total) by mouth daily. 90 tablet 1   omeprazole (PRILOSEC) 20 MG capsule Take 20 mg by mouth every morning.      celecoxib (CELEBREX) 100 MG capsule Take 1 capsule (100 mg total) by mouth daily. (Patient not taking: Reported on 09/22/2021) 30 capsule 0   cyclobenzaprine (FLEXERIL) 10 MG tablet Take 1 tablet (10 mg total) by mouth 3 (three) times daily as needed for muscle spasms. (Patient not taking: Reported on 09/22/2021) 30 tablet 0   No current facility-administered medications for this visit.     Allergies:   Betadine [povidone iodine], Metrizamide, Other, Salicylic acid-sulfur, Bee pollen, Meloxicam, Pollen extract, Sulindac, Compazine  [prochlorperazine], Contrast media [iodinated diagnostic agents], Doxycycline, Elemental sulfur, Gabapentin, Keppra [levetiracetam], Lyrica [pregabalin], Rosuvastatin, Codeine, Hydroxychloroquine, Iodine, and Sulfa antibiotics   Social History:  The patient  reports that she has never smoked. She has never used smokeless tobacco. She reports current alcohol use. She reports that she does not use drugs.   Family History:   family history includes Diabetes in her sister; Epilepsy in her sister; Heart  attack in her maternal grandfather; Hyperlipidemia in her mother and sister; Hypertension in her brother, daughter, sister, sister, sister, and son; Kidney Stones in her daughter and son; Pneumonia in her maternal grandmother.    Review of Systems: Review of Systems  Constitutional: Negative.   HENT: Negative.    Respiratory: Negative.    Cardiovascular: Negative.   Gastrointestinal: Negative.   Musculoskeletal: Negative.   Neurological: Negative.   Psychiatric/Behavioral: Negative.    All other systems reviewed and are negative.   PHYSICAL EXAM: VS:  BP 120/82 (BP Location: Left Arm, Patient Position: Sitting, Cuff Size: Normal)   Pulse 72   Ht 5' 3" (1.6 m)   Wt 162 lb (73.5 kg)   SpO2 96%   BMI 28.70 kg/m  , BMI Body mass index is 28.7 kg/m. GEN: Well nourished, well developed, in no acute distress HEENT: normal Neck: no JVD, carotid bruits, or masses Cardiac: RRR; no murmurs, rubs, or gallops,no edema  Respiratory:  clear to auscultation bilaterally, normal work of breathing GI: soft, nontender, nondistended, + BS MS: no deformity or atrophy Skin: warm and dry, no rash Neuro:  Strength and sensation are intact Psych: euthymic mood, full affect     Recent Labs: 07/02/2021: ALT 27; BUN 15; Creat 0.65; Hemoglobin 13.4; Platelets 281; Potassium 4.0; Sodium 143; TSH 0.72    Lipid Panel Lab Results  Component Value Date   CHOL 214 (H) 02/23/2021   HDL 48 (L) 02/23/2021   LDLCALC 128 (H) 02/23/2021   TRIG 238 (H) 02/23/2021      Wt Readings from Last 3 Encounters:  09/22/21 162 lb (73.5 kg)  09/08/21 162 lb (73.5 kg)  07/13/21 168 lb 14.4 oz (76.6 kg)      ASSESSMENT AND PLAN:  Problem List Items Addressed This Visit       Cardiology Problems   Benign hypertension (Chronic)   Relevant Orders   EKG 12-Lead   Mild hypercholesterolemia     Other   Palpitations   Diabetes mellitus with microalbuminuria (HCC) - Primary   Major depression in remission (HCC)    Relevant Medications   DULoxetine (CYMBALTA) 30 MG capsule   DULoxetine (CYMBALTA) 20 MG capsule   Anxiety   Relevant Medications   DULoxetine (CYMBALTA) 30 MG capsule   DULoxetine (CYMBALTA) 20 MG capsule   Diabetes type 2 no complications Numbers much improved, A1c 5.5 We have encouraged continued exercise, careful diet management in an effort to lose weight.  Palpitations Unable to exclude APCs, PVCs, ZIO monitor offered Limited scope in her symptoms, feels they are exacerbated by anxiety recent stressors She will call us if symptoms get worse, monitor could be ordered  Depression/anxiety Exacerbated by various issues, health issues, disability left shoulder, been out of work, may lose her job  Hyperlipidemia There was no significant aortic atherosclerosis on CT scan Diabetes well controlled Discussed CT coronary calcium scoring for any chest pain or shortness of breath symptoms Prefers not to be on a statin Could consider Zetia   Total encounter time more than 60 minutes  Greater than 50% was spent in counseling and coordination of care with the patient  Patient seen in consultation for Gabriel Cirri I will be referred back to her office for ongoing care of the issues detailed above   Signed, Dossie Arbour, M.D., Ph.D. [facility] [place], [place] [phone]

## 2021-11-06 ENCOUNTER — Encounter: Payer: Self-pay | Admitting: Internal Medicine

## 2021-12-04 NOTE — Progress Notes (Signed)
Appointment was cancelled.

## 2021-12-15 ENCOUNTER — Encounter: Payer: Self-pay | Admitting: Family Medicine

## 2021-12-16 ENCOUNTER — Other Ambulatory Visit: Payer: Self-pay

## 2021-12-16 DIAGNOSIS — G894 Chronic pain syndrome: Secondary | ICD-10-CM

## 2021-12-16 DIAGNOSIS — M25512 Pain in left shoulder: Secondary | ICD-10-CM

## 2021-12-16 DIAGNOSIS — M159 Polyosteoarthritis, unspecified: Secondary | ICD-10-CM

## 2021-12-16 NOTE — Telephone Encounter (Signed)
Referral placed.

## 2021-12-24 ENCOUNTER — Other Ambulatory Visit: Payer: Self-pay | Admitting: Family Medicine

## 2021-12-24 NOTE — Telephone Encounter (Signed)
Requested medications are due for refill today.  yes  Requested medications are on the active medications list.  yes  Last refill. 04/27/2021 #30 0 refills  Future visit scheduled.   yes  Notes to clinic.  Medication not delegated.    Requested Prescriptions  Pending Prescriptions Disp Refills   cyclobenzaprine (FLEXERIL) 10 MG tablet [Pharmacy Med Name: Cyclobenzaprine HCl 10 MG Oral Tablet] 90 tablet 0    Sig: TAKE 1 TABLET BY MOUTH ONCE DAILY AS NEEDED     Not Delegated - Analgesics:  Muscle Relaxants Failed - 12/24/2021  6:00 PM      Failed - This refill cannot be delegated      Passed - Valid encounter within last 6 months    Recent Outpatient Visits           3 months ago Dyslipidemia associated with type 2 diabetes mellitus [facility])   [facility] Steele Sizer, MD   5 months ago Palpitations   [place], DO   5 months ago Palpitations   [place], NP   7 months ago Glycosuria   [facility] Steele Sizer, MD   10 months ago Well adult exam   [facility] Steele Sizer, MD       Future Appointments             In 2 months Ancil Boozer, Drue Stager, MD [facility], [place]   In 5 months Brendolyn Patty, MD [place]

## 2021-12-25 ENCOUNTER — Other Ambulatory Visit: Payer: Self-pay

## 2021-12-25 ENCOUNTER — Encounter: Payer: Self-pay | Admitting: Physician Assistant

## 2021-12-25 ENCOUNTER — Ambulatory Visit: Payer: BC Managed Care – PPO | Admitting: Physician Assistant

## 2021-12-25 VITALS — BP 136/77 | HR 80 | Temp 98.5°F | Resp 16 | Ht 63.0 in | Wt 161.6 lb

## 2021-12-25 DIAGNOSIS — I10 Essential (primary) hypertension: Secondary | ICD-10-CM | POA: Diagnosis not present

## 2021-12-25 DIAGNOSIS — Z8616 Personal history of COVID-19: Secondary | ICD-10-CM | POA: Diagnosis not present

## 2021-12-25 DIAGNOSIS — R0602 Shortness of breath: Secondary | ICD-10-CM

## 2021-12-25 NOTE — Progress Notes (Signed)
[facility] [place], [place] [address]  Pulmonary Sleep Medicine   Office Visit Note  Patient Name: Yolanda Pace DOB: [birth year] MRN [id number]  Date of Service: 12/25/2021  Complaints/HPI: Pt is here for routine pulmonary follow up due to post covid symptoms. She had covid in Aug 2021 and caused some asthma symptoms. Arlyce Harman today shows slight improvement with FEV1 of 2.2, 85% predicted which is normal with FVC 2.6, 79%. Gets Sob more so with laying down at times and thinks this may be due to some of her anxiety. Reports she did see cardiology due to [facility] and some palpitations and reports eveything looked ok and he had reviewed some prior imaging of her spine in which he could visualize her heart. Denies having an echo and discussed considering this if continued SOB and if any ankle swelling develops. May also consider PSG vs ONO to evaluate sleep though denies a problem with this.  ROS  General: (-) fever, (-) chills, (-) night sweats, (-) weakness Skin: (-) rashes, (-) itching,. Eyes: (-) visual changes, (-) redness, (-) itching. Nose and Sinuses: (-) nasal stuffiness or itchiness, (-) postnasal drip, (-) nosebleeds, (-) sinus trouble. Mouth and Throat: (-) sore throat, (-) hoarseness. Neck: (-) swollen glands, (-) enlarged thyroid, (-) neck pain. Respiratory: - cough, (-) bloody sputum, + shortness of breath, - wheezing. Cardiovascular: - ankle swelling, (-) chest pain. Lymphatic: (-) lymph node enlargement. Neurologic: (-) numbness, (-) tingling. Psychiatric: (-) anxiety, (-) depression   Current Medication: Outpatient Encounter Medications as of 12/25/2021  Medication Sig   acetaminophen (TYLENOL) 500 MG tablet Take 1,000 mg by mouth every 8 (eight) hours as needed for moderate pain.   albuterol (PROVENTIL) (2.5 MG/3ML) 0.083% nebulizer solution Take 3 mLs (2.5 mg total) by nebulization every 4 (four) hours as needed for wheezing or shortness of breath.    albuterol (VENTOLIN HFA) 108 (90 Base) MCG/ACT inhaler Inhale 2 puffs into the lungs 4 (four) times daily as needed for shortness of breath or wheezing.   blood glucose meter kit and supplies Dispense based on patient and insurance preference. Use up to four times daily as directed. (FOR ICD-10 E10.9, E11.9).   celecoxib (CELEBREX) 100 MG capsule Take 1 capsule (100 mg total) by mouth daily.   Cholecalciferol (VITAMIN D) 2000 units tablet Take 2,000 Units by mouth daily.    cyclobenzaprine (FLEXERIL) 10 MG tablet Take 1 tablet (10 mg total) by mouth 3 (three) times daily as needed for muscle spasms.   DULoxetine (CYMBALTA) 20 MG capsule Take 20 mg by mouth daily.   DULoxetine (CYMBALTA) 30 MG capsule Take 30 mg by mouth daily.   DULoxetine (CYMBALTA) 60 MG capsule Take 1 capsule (60 mg total) by mouth daily.   ibuprofen (ADVIL) 200 MG tablet Take 400-600 mg by mouth every 8 (eight) hours as needed for moderate pain.   levocetirizine (XYZAL) 5 MG tablet Take 1 tablet (5 mg total) by mouth every evening.   montelukast (SINGULAIR) 10 MG tablet Take 1 tablet (10 mg total) by mouth at bedtime.   olmesartan (BENICAR) 40 MG tablet Take 1 tablet (40 mg total) by mouth daily.   omeprazole (PRILOSEC) 20 MG capsule Take 20 mg by mouth every morning.    No facility-administered encounter medications on file as of 12/25/2021.    Surgical History: Past Surgical History:  Procedure Laterality Date   ABDOMINAL HYSTERECTOMY  2005ish   fibroids, ovaries remain; along with bladder tack   BACK  SURGERY     Lower back   bladder sling mesh removal  08/2018   bladder tack  2005ish   CHOLECYSTECTOMY     COLONOSCOPY WITH PROPOFOL N/A 06/22/2018   Procedure: COLONOSCOPY WITH PROPOFOL;  Surgeon: Virgel Manifold, MD;  Location: [facility] ENDOSCOPY;  Service: Endoscopy;  Laterality: N/A;   CYSTO WITH HYDRODISTENSION N/A 05/02/2018   Procedure: CYSTOSCOPY/HYDRODISTENSION;  Surgeon: Royston Cowper, MD;  Location:  [facility] ORS;  Service: Urology;  Laterality: N/A;   ESOPHAGOGASTRODUODENOSCOPY (EGD) WITH PROPOFOL N/A 06/22/2018   Procedure: ESOPHAGOGASTRODUODENOSCOPY (EGD) WITH PROPOFOL;  Surgeon: Virgel Manifold, MD;  Location: [facility] ENDOSCOPY;  Service: Endoscopy;  Laterality: N/A;   HEMI-MICRODISCECTOMY LUMBAR LAMINECTOMY LEVEL 1 Left 04/27/2021   Procedure: LEFT L4-5 HEMILAMINECTOMY;  Surgeon: Deetta Perla, MD;  Location: [facility] ORS;  Service: Neurosurgery;  Laterality: Left;   INCONTINENCE SURGERY     ROTATOR CUFF REPAIR Left 01/08/2019   rotator cuff revision Left 12/06/2019   SKIN CANCER EXCISION  06/2014   removed from back    Medical History: Past Medical History:  Diagnosis Date   Allergic rhinitis    Anxiety    Arthritis    Asthma    WELL CONTROLLED   Basal cell carcinoma 05/31/2014   left spinal mid back   Complication of anesthesia    PT STATES THAT SHE WAS GIVEN SOME TYPE OF ANESTHESIA THAT MADE HER HAVE A SEIZURE IN 2015-NOTE IS IN CHART FROM [place] ANESTHESIA FROM 2015   Cystic thyroid nodule March 2015   65m on u/s   Depression    Fibromyalgia    GERD (gastroesophageal reflux disease)    History of kidney stones    SMALL STONE CURRENTLY   Hypertension    Insomnia    Joint pain 04/2018   PT SEEING RHEUMATOLOGIST FOR JOINT PAIN THAT OCCURS APP ONCE A MONTH AND IS SO BAD THAT SHE CANT GET OUT OF BED   Obesity    PONV (postoperative nausea and vomiting)    Pre-diabetes    Right thyroid nodule 05/26/2017   Solid 1 cm RIGHT lobe July 2018   [place]iz[place] 2015   provoked during cervical nerve block   Skin cancer Aug 2015   removed from back   Squamous cell carcinoma of skin 08/11/2020   Right forearm, EDC   Vitamin D deficiency disease     Family History: Family History  Problem Relation Age of Onset   Hyperlipidemia Mother    Diabetes Sister    Hyperlipidemia Sister    Hypertension Sister    Hypertension Brother    Hypertension Son    Kidney Stones Son     Pneumonia Maternal Grandmother    Heart attack Maternal Grandfather    Hypertension Sister    Epilepsy Sister    Hypertension Sister    Hypertension Daughter    Kidney Stones Daughter    Cancer Neg Hx    COPD Neg Hx    Stroke Neg Hx     Social History: Social History   Socioeconomic History   Marital status: Married    Spouse name: Not on file   Number of children: 3   Years of education: Not on file   Highest education level: Some college, no degree  Occupational History   Not on file  Tobacco Use   Smoking status: Never   Smokeless tobacco: Never  Vaping Use   Vaping Use: Never used  Substance and Sexual Activity   Alcohol use:  Yes    Comment: occ   Drug use: No   Sexual activity: Yes  Other Topics Concern   Not on file  Social History Narrative   Not on file   Social Determinants of Health   Financial Resource Strain: Low Risk    Difficulty of Paying Living Expenses: Not hard at all  Food Insecurity: No Food Insecurity   Worried About Charity fundraiser in the Last Year: Never true   [place] in the Last Year: Never true  Transportation Needs: No Transportation Needs   Lack of Transportation (Medical): No   Lack of Transportation (Non-Medical): No  Physical Activity: Inactive   Days of Exercise per Week: 0 days   Minutes of Exercise per Session: 0 min  Stress: Stress Concern Present   Feeling of Stress : To some extent  Social Connections: Moderately Integrated   Frequency of Communication with Friends and Family: More than three times a week   Frequency of Social Gatherings with Friends and Family: Once a week   Attends Religious Services: More than 4 times per year   Active Member of [employer] or Organizations: No   Attends Archivist Meetings: Never   Marital Status: Married  Human resources officer Violence: Not At Risk   Fear of Current or Ex-Partner: No   Emotionally Abused: No   Physically Abused: No   Sexually Abused: No    Vital  Signs: Blood pressure 136/77, pulse 80, temperature 98.5 F (36.9 C), resp. rate 16, height _0  (1.6 m), weight 161 lb 9.6 oz (73.3 kg), SpO2 95 %.  Examination: General Appearance: The patient is well-developed, well-nourished, and in no distress. Skin: Gross inspection of skin unremarkable. Head: normocephalic, no gross deformities. Eyes: no gross deformities noted. ENT: ears appear grossly normal no exudates. Neck: Supple. No thyromegaly. No LAD. Respiratory: Lungs clear to ausculation bilaterally. Cardiovascular: Normal S1 and S2 without murmur or rub. Extremities: No cyanosis. pulses are equal. Neurologic: Alert and oriented. No involuntary movements.  LABS: No results found for this or any previous visit (from the past 2160 hour(s)).  Radiology: MR Lumbar Spine W Wo Contrast  Result Date: 07/13/2021 CLINICAL DATA:  Left leg pain and numbness. Pain is increased since lumbar decompression is 04/27/2021 EXAM: MRI LUMBAR SPINE WITHOUT AND WITH CONTRAST TECHNIQUE: Multiplanar and multiecho pulse sequences of the lumbar spine were obtained without and with intravenous contrast. CONTRAST:  7.22m GADAVIST GADOBUTROL 1 MMOL/ML IV SOLN COMPARISON:  MRI of lumbar spine 12/28/2019 FINDINGS: Segmentation: 5 non rib-bearing lumbar type vertebral bodies are present. The lowest fully formed vertebral body is L5. Alignment: No significant listhesis is present. Lumbar lordosis preserved. Mild rightward curvature at L4 is stable. Vertebrae: Mild fatty endplate marrow changes are again noted at L5-S1 bilaterally and on the left at L4-5. Conus medullaris and cauda equina: Conus extends to the L1-2 level. Conus and cauda equina appear normal. Paraspinal and other soft tissues: Chest Limited imaging the abdomen is unremarkable. There is no significant adenopathy. No solid organ lesions are present. Disc levels: T12-L1: Since minimal disc bulge is present without significant stenosis. L1-2: Negative. L2-3:  Mild disc bulging is asymmetric to the right. Is no significant stenosis is present. L3-4: A leftward disc protrusion is again seen. Mild left foraminal narrowing is stable. L4-5: Left laminectomy is present. Enhancing granulation tissue noted on the left. No significant residual or recurrent stenosis. L5-S1: Shallow central disc protrusion is present. No significant stenosis or change. IMPRESSION:  1. Stable postoperative changes on the left at L4-5 without significant residual or recurrent stenosis. 2. Stable mild left foraminal narrowing at L3-4. 3. Shallow central disc protrusion at L5-S1 without significant stenosis or change. 4. Since minimal disc bulge at T12-L1 is present without significant stenosis. Electronically Signed   By: San Morelle M.D.   On: 07/13/2021 21:37    No results found.  No results found.    Assessment and Plan: Patient Active Problem List   Diagnosis Date Noted   Palpitations 07/13/2021   Diabetes mellitus with microalbuminuria ([place]) 02/23/2021   Fibromyalgia 02/20/2021   Myofascial pain dysfunction syndrome 02/20/2021   Connective tissue disease ([place]) 10/31/2020   Chronic shoulder pain 07/07/2020   Mild intermittent asthma without complication 16/08/9603   Traumatic incomplete tear of left rotator cuff 04/23/2020   Arthralgia 08/03/2019   Gastric polyp    Benign neoplasm of transverse colon    Internal hemorrhoids    Malignant neoplasm of skin 06/19/2018   Chronic pain syndrome 54/07/8118   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 Shortness of breath - Spirometry with Graph shows improvement, may use inhaler as needed. May consider echo vs PSG/ONO if any continued SOB with laying down that does not appear to be due to anxiety or if ankle swelling occurs.  2. History of COVID-19 Lung function continue to improve since covid, will continue to monitor  3. Essential hypertension Improved, followed by PCP   General Counseling: I have discussed the findings of the evaluation and examination with Jadie.  I have also discussed any further diagnostic evaluation thatmay be needed or ordered today. Tameyah verbalizes understanding of the findings of todays visit. We also reviewed her medications today and discussed drug interactions and side effects including but not limited excessive drowsiness and altered mental states. We also discussed that there is always a risk not just to her but also people around her. she has been encouraged to call the office with any questions or concerns that should arise related to todays visit.  Orders Placed This Encounter  Procedures   Spirometry with Graph    Order Specific Question:   Where should this test be performed?    Answer:   Other     Time spent: 30  I have personally obtained a history, examined the patient, evaluated laboratory and imaging results, formulated the assessment and plan and placed orders. This patient was seen by Drema Dallas, PA-C in collaboration with Dr. Devona Konig as a part of collaborative care agreement.     Allyne Gee, MD [facility] Pulmonary and Critical Care Sleep medicine

## 2021-12-29 ENCOUNTER — Ambulatory Visit: Payer: BC Managed Care – PPO | Admitting: Family Medicine

## 2022-01-19 ENCOUNTER — Other Ambulatory Visit: Payer: Self-pay | Admitting: Family Medicine

## 2022-01-19 DIAGNOSIS — J3089 Other allergic rhinitis: Secondary | ICD-10-CM

## 2022-01-19 DIAGNOSIS — J302 Other seasonal allergic rhinitis: Secondary | ICD-10-CM

## 2022-01-19 DIAGNOSIS — J4520 Mild intermittent asthma, uncomplicated: Secondary | ICD-10-CM

## 2022-03-09 NOTE — Progress Notes (Signed)
Name: Yolanda Pace   MRN: [id number]    DOB: Feb 08, 1966   Date:03/10/2022 ? ?     Progress Note ? ?Subjective ? ?Chief Complaint ? ?Follow Up ? ?HPI ? ?FMS/elevated CRP/Connective Tissue Disease : she was seen by Dr. Jefferson Fuel in the past She states x-rays were done and also labs , inflammatory markers improved and she was given reassurance that is it OA and to continue to monitor.  Takes Duloxetine  but only at 20 mg now , she takes flexeril prn for sleep. . She still has daily pain, and much worse since went down on dose of Duloxetine  Diagnosed with some form of connective tissue disease ? ?DM in obese: glucose in urine, A1C was  6.4% , 6.1 % it went up to 7.5 % 5.9 % 5.5% with dietary modification. Discussed importance of statin therapy, but she refused again today. We will recheck labs  ? ?Depression moderate  she was on Duloxetine for FMS and because she knew she would lose her insurance ( she was on [employer] ) she weaned self off back in March , she completely weaned self off since June 2023. She felt very depressed, and while weaned she had sweats, vivid dreams, brain zap, she felt very scared, developed heart palpitations. She felt suicidal at one point, cried a lot.Her daughter took her to a walk in clinic back in September 2022 and she resumed medication Duloxetine at 20 mg daily, she is very scared of resuming a higher dose because she is afraid of having the symptoms again. She is scared of taking any medications.  ? ?GERD: controlled with medication, denies heartburn or indigestion . Taking Omeprazole ? ?Mild intermittent asthma: seeing Dr. Humphrey Rolls and is off steroid inhaler, uses rescue and also on singulair . She is not on maintenance steroid at this time. She is not living the house due to fear of germs and not sick in a long time, states the phobia is improving  ?  ?Chronic back pain with radiculitis: had lumbar laminectomy done by Dr. Lacinda Axon June 2022 currently taking low dose duloxetine and flexeril  prn . No bowel or bladder incontinence. She states pain is getting worse again and had NCS done by Dr. Manuella Ghazi and will follow up with Dr. Phyllis Ginger.  ? ?She continues to feel very tired we will check TSH ? ?Patient Active Problem List  ? Diagnosis Date Noted  ? Palpitations 07/13/2021  ? Diabetes mellitus with microalbuminuria ([place]) 02/23/2021  ? Fibromyalgia 02/20/2021  ? Myofascial pain dysfunction syndrome 02/20/2021  ? Connective tissue disease ([place]) 10/31/2020  ? Chronic shoulder pain 07/07/2020  ? Mild intermittent asthma without complication 86/76/7209  ? Traumatic incomplete tear of left rotator cuff 04/23/2020  ? Arthralgia 08/03/2019  ? Gastric polyp   ? Benign neoplasm of transverse colon   ? Internal hemorrhoids   ? Malignant neoplasm of skin 06/19/2018  ? Chronic pain syndrome 06/19/2018  ? 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  ? Kidney stone 02/20/2018  ? Renal lesion 02/20/2018  ? Benign hypertension 01/21/2018  ? Pelvic pain 12/26/2017  ? Major depression in remission ([place]) 12/26/2017  ? GERD (gastroesophageal reflux disease) 11/09/2017  ? Elevated beta-2 microglobulin 07/11/2017  ? Right thyroid nodule 05/26/2017  ? Mild hypercholesterolemia 05/06/2017  ? Generalized osteoarthritis of hand 12/13/2016  ? Impingement syndrome of shoulder region, left 11/29/2016  ? Anxiety   ? Allergic rhinitis   ? Insomnia   ? Vitamin D deficiency disease   ? ? ?  Past Surgical History:  ?Procedure Laterality Date  ? ABDOMINAL HYSTERECTOMY  2005ish  ? fibroids, ovaries remain; along with bladder tack  ? BACK SURGERY    ? Lower back  ? bladder sling mesh removal  08/2018  ? bladder tack  2005ish  ? CHOLECYSTECTOMY    ? COLONOSCOPY WITH PROPOFOL N/A 06/22/2018  ? Procedure: COLONOSCOPY WITH PROPOFOL;  Surgeon: Virgel Manifold, MD;  Location: [facility];  Service: Endoscopy;  Laterality: N/A;  ? CYSTO WITH HYDRODISTENSION N/A 05/02/2018  ? Procedure: CYSTOSCOPY/HYDRODISTENSION;  Surgeon:  Royston Cowper, MD;  Location: [facility];  Service: Urology;  Laterality: N/A;  ? ESOPHAGOGASTRODUODENOSCOPY (EGD) WITH PROPOFOL N/A 06/22/2018  ? Procedure: ESOPHAGOGASTRODUODENOSCOPY (EGD) WITH PROPOFOL;  Surgeon: Virgel Manifold, MD;  Location: [facility];  Service: Endoscopy;  Laterality: N/A;  ? HEMI-MICRODISCECTOMY LUMBAR LAMINECTOMY LEVEL 1 Left 04/27/2021  ? Procedure: LEFT L4-5 HEMILAMINECTOMY;  Surgeon: Deetta Perla, MD;  Location: [facility];  Service: Neurosurgery;  Laterality: Left;  ? INCONTINENCE SURGERY    ? ROTATOR CUFF REPAIR Left 01/08/2019  ? rotator cuff revision Left 12/06/2019  ? SKIN CANCER EXCISION  06/2014  ? removed from back  ? ? ?Family History  ?Problem Relation Age of Onset  ? Hyperlipidemia Mother   ? Diabetes Sister   ? Hyperlipidemia Sister   ? Hypertension Sister   ? Hypertension Brother   ? Hypertension Son   ? Kidney Stones Son   ? Pneumonia Maternal Grandmother   ? Heart attack Maternal Grandfather   ? Hypertension Sister   ? Epilepsy Sister   ? Hypertension Sister   ? Hypertension Daughter   ? Kidney Stones Daughter   ? Cancer Neg Hx   ? COPD Neg Hx   ? Stroke Neg Hx   ? ? ?Social History  ? ?Tobacco Use  ? Smoking status: Never  ? Smokeless tobacco: Never  ?Substance Use Topics  ? Alcohol use: Yes  ?  Comment: occ  ? ? ? ?Current Outpatient Medications:  ?  acetaminophen (TYLENOL) 500 MG tablet, Take 1,000 mg by mouth every 8 (eight) hours as needed for moderate pain., Disp: , Rfl:  ?  albuterol (PROVENTIL) (2.5 MG/3ML) 0.083% nebulizer solution, Take 3 mLs (2.5 mg total) by nebulization every 4 (four) hours as needed for wheezing or shortness of breath., Disp: 75 mL, Rfl: 1 ?  albuterol (VENTOLIN HFA) 108 (90 Base) MCG/ACT inhaler, Inhale 2 puffs into the lungs 4 (four) times daily as needed for shortness of breath or wheezing., Disp: , Rfl:  ?  blood glucose meter kit and supplies, Dispense based on patient and insurance preference. Use up to four times daily as  directed. (FOR ICD-10 E10.9, E11.9)., Disp: 1 each, Rfl: 0 ?  Cholecalciferol (VITAMIN D) 2000 units tablet, Take 2,000 Units by mouth daily. , Disp: , Rfl:  ?  cyclobenzaprine (FLEXERIL) 10 MG tablet, TAKE 1 TABLET BY MOUTH ONCE DAILY AS NEEDED, Disp: 90 tablet, Rfl: 0 ?  fluticasone (FLONASE) 50 MCG/ACT nasal spray, Place into both nostrils., Disp: , Rfl:  ?  ibuprofen (ADVIL) 200 MG tablet, Take 400-600 mg by mouth every 8 (eight) hours as needed for moderate pain., Disp: , Rfl:  ?  DULoxetine (CYMBALTA) 30 MG capsule, Take 1 capsule (30 mg total) by mouth daily., Disp: 90 capsule, Rfl: 1 ?  levocetirizine (XYZAL) 5 MG tablet, Take 1 tablet (5 mg total) by mouth every evening., Disp: 90 tablet, Rfl: 1 ?  montelukast (SINGULAIR) 10 MG  tablet, Take 1 tablet (10 mg total) by mouth at bedtime., Disp: 90 tablet, Rfl: 1 ?  olmesartan (BENICAR) 40 MG tablet, Take 1 tablet (40 mg total) by mouth daily., Disp: 90 tablet, Rfl: 1 ?  omeprazole (PRILOSEC) 20 MG capsule, Take 1 capsule (20 mg total) by mouth every morning., Disp: 90 capsule, Rfl: 1 ? ?Allergies  ?Allergen Reactions  ? Betadine [Povidone Iodine] Swelling, Dermatitis and Rash  ?  Pt states site gets infected  ? Metrizamide Swelling  ?  Noted in [place]. chart on 01/06/2012. Premedicate patient prior to contrast media injections.  ? Other   ?  [place]: vomiting   ? Salicylic Acid-Sulfur   ?  Burst eye vessels  ? Bee Pollen   ?  Sneezing, watery eyes, coughing.   ? Meloxicam Itching  ? Pollen Extract Other (See Comments)  ?  Sneezing, watery eyes, coughing.   ? Sulindac Anxiety and Other (See Comments)  ? Compazine  [Prochlorperazine] Other (See Comments)  ?  Jaws locked up  ? Contrast Media [Iodinated Contrast Media] Swelling  ?  Noted in [place]. chart on 01/06/2012. Premedicate patient prior to contrast media injections.  ? Doxycycline Nausea Only  ?  GI upset  ? Elemental Sulfur Other (See Comments)  ?  Made all blood vessels in eyes burst  ? Gabapentin  Nausea And Vomiting  ?  Lip numbness   ? Keppra [Levetiracetam]   ?  Pt was not herself   ? Lyrica [Pregabalin]   ?  Tingling, depression, blister mouth   ? Rosuvastatin   ?  Mental fogginess, confusion, fat

## 2022-03-10 ENCOUNTER — Ambulatory Visit: Payer: BC Managed Care – PPO | Admitting: Family Medicine

## 2022-03-10 ENCOUNTER — Encounter: Payer: Self-pay | Admitting: Family Medicine

## 2022-03-10 ENCOUNTER — Telehealth: Payer: Self-pay

## 2022-03-10 VITALS — BP 134/74 | HR 77 | Temp 98.2°F | Resp 16 | Ht 63.0 in | Wt 163.6 lb

## 2022-03-10 DIAGNOSIS — E559 Vitamin D deficiency, unspecified: Secondary | ICD-10-CM

## 2022-03-10 DIAGNOSIS — J4520 Mild intermittent asthma, uncomplicated: Secondary | ICD-10-CM

## 2022-03-10 DIAGNOSIS — J302 Other seasonal allergic rhinitis: Secondary | ICD-10-CM

## 2022-03-10 DIAGNOSIS — M359 Systemic involvement of connective tissue, unspecified: Secondary | ICD-10-CM

## 2022-03-10 DIAGNOSIS — J3089 Other allergic rhinitis: Secondary | ICD-10-CM

## 2022-03-10 DIAGNOSIS — F339 Major depressive disorder, recurrent, unspecified: Secondary | ICD-10-CM | POA: Diagnosis not present

## 2022-03-10 DIAGNOSIS — I10 Essential (primary) hypertension: Secondary | ICD-10-CM | POA: Diagnosis not present

## 2022-03-10 DIAGNOSIS — R5383 Other fatigue: Secondary | ICD-10-CM

## 2022-03-10 DIAGNOSIS — E785 Hyperlipidemia, unspecified: Secondary | ICD-10-CM

## 2022-03-10 DIAGNOSIS — Z1211 Encounter for screening for malignant neoplasm of colon: Secondary | ICD-10-CM | POA: Diagnosis not present

## 2022-03-10 DIAGNOSIS — E1169 Type 2 diabetes mellitus with other specified complication: Secondary | ICD-10-CM | POA: Diagnosis not present

## 2022-03-10 DIAGNOSIS — G894 Chronic pain syndrome: Secondary | ICD-10-CM

## 2022-03-10 DIAGNOSIS — M797 Fibromyalgia: Secondary | ICD-10-CM

## 2022-03-10 DIAGNOSIS — K219 Gastro-esophageal reflux disease without esophagitis: Secondary | ICD-10-CM

## 2022-03-10 MED ORDER — OLMESARTAN MEDOXOMIL 40 MG PO TABS: 40.0000 mg | ORAL_TABLET | Freq: Every day | ORAL | 1 refills | Status: DC

## 2022-03-10 MED ORDER — OMEPRAZOLE 20 MG PO CPDR: 20.0000 mg | DELAYED_RELEASE_CAPSULE | ORAL | 1 refills | Status: DC

## 2022-03-10 MED ORDER — MONTELUKAST SODIUM 10 MG PO TABS: 10.0000 mg | ORAL_TABLET | Freq: Every day | ORAL | 1 refills | Status: DC

## 2022-03-10 MED ORDER — DULOXETINE HCL 30 MG PO CPEP: 30.0000 mg | ORAL_CAPSULE | Freq: Every day | ORAL | 1 refills | Status: DC

## 2022-03-10 MED ORDER — LEVOCETIRIZINE DIHYDROCHLORIDE 5 MG PO TABS: 5.0000 mg | ORAL_TABLET | Freq: Every evening | ORAL | 1 refills | Status: DC

## 2022-03-10 NOTE — Telephone Encounter (Signed)
LVM for pt to return my call.

## 2022-03-11 LAB — COMPLETE METABOLIC PANEL WITH GFR
AG Ratio: 1.8 (calc) (ref 1.0–2.5)
ALT: 10 U/L (ref 6–29)
AST: 10 U/L (ref 10–35)
Albumin: 4.4 g/dL (ref 3.6–5.1)
Alkaline phosphatase (APISO): 63 U/L (ref 37–153)
BUN: 18 mg/dL (ref 7–25)
CO2: 27 mmol/L (ref 20–32)
Calcium: 10 mg/dL (ref 8.6–10.4)
Chloride: 106 mmol/L (ref 98–110)
Creat: 0.69 mg/dL (ref 0.50–1.03)
Globulin: 2.5 g/dL (calc) (ref 1.9–3.7)
Glucose, Bld: 94 mg/dL (ref 65–99)
Potassium: 4.7 mmol/L (ref 3.5–5.3)
Sodium: 141 mmol/L (ref 135–146)
Total Bilirubin: 0.7 mg/dL (ref 0.2–1.2)
Total Protein: 6.9 g/dL (ref 6.1–8.1)
eGFR: 102 mL/min/{1.73_m2} (ref >=60)

## 2022-03-11 LAB — CBC WITH DIFFERENTIAL/PLATELET
Absolute Monocytes: 427 cells/uL (ref 200–950)
Basophils Absolute: 70 cells/uL (ref 0–200)
Basophils Relative: 1 %
Eosinophils Absolute: 98 cells/uL (ref 15–500)
Eosinophils Relative: 1.4 %
HCT: 43.8 % (ref 35.0–45.0)
Hemoglobin: 14.7 g/dL (ref 11.7–15.5)
Lymphs Abs: 2219 cells/uL (ref 850–3900)
MCH: 29.8 pg (ref 27.0–33.0)
MCHC: 33.6 g/dL (ref 32.0–36.0)
MCV: 88.8 fL (ref 80.0–100.0)
MPV: 9.7 fL (ref 7.5–12.5)
Monocytes Relative: 6.1 %
Neutro Abs: 4186 cells/uL (ref 1500–7800)
Neutrophils Relative %: 59.8 %
Platelets: 315 10*3/uL (ref 140–400)
RBC: 4.93 10*6/uL (ref 3.80–5.10)
RDW: 12.3 % (ref 11.0–15.0)
Total Lymphocyte: 31.7 %
WBC: 7 10*3/uL (ref 3.8–10.8)

## 2022-03-11 LAB — TSH: TSH: 0.99 mIU/L (ref 0.40–4.50)

## 2022-03-11 LAB — LIPID PANEL
Cholesterol: 214 mg/dL — ABNORMAL HIGH (ref <200)
HDL: 63 mg/dL (ref >=50)
LDL Cholesterol (Calc): 131 mg/dL (calc) — ABNORMAL HIGH
Non-HDL Cholesterol (Calc): 151 mg/dL (calc) — ABNORMAL HIGH (ref <130)
Total CHOL/HDL Ratio: 3.4 (calc) (ref <5.0)
Triglycerides: 97 mg/dL (ref <150)

## 2022-03-11 LAB — HEMOGLOBIN A1C
Hgb A1c MFr Bld: 5.3 % of total Hgb (ref <5.7)
Mean Plasma Glucose: 105 mg/dL
eAG (mmol/L): 5.8 mmol/L

## 2022-03-12 ENCOUNTER — Telehealth: Payer: Self-pay

## 2022-03-12 NOTE — Telephone Encounter (Signed)
CALLED PATIENT NO ANSWER LEFT VOICEMAIL FOR A CALL BACK °Letter sent °

## 2022-03-17 ENCOUNTER — Encounter: Payer: Self-pay | Admitting: Family Medicine

## 2022-03-18 NOTE — Progress Notes (Signed)
Name: Yolanda Pace   MRN: [id number]    DOB: [birth year]   Date:03/19/2022 ? ?     Progress Note ? ?Subjective ? ?Chief Complaint ? ?Left leg pain  ? ?HPI ? ?Left lower leg varicose vein: she states no trauma or change in activity but was in pain throughout the night three nights ago, she looked in the mirror and saw a enlarged vein, not tender to touch, more a throbbing feeling, denies leg edema, no redness, she did not have the vein before the pain started. She states not as painful now, feels a little sore but no longer throbbing ? ?Patient Active Problem List  ? Diagnosis Date Noted  ? Symptomatic varicose veins, left 03/19/2022  ? Palpitations 07/13/2021  ? Diabetes mellitus with microalbuminuria ([place]) 02/23/2021  ? Fibromyalgia 02/20/2021  ? Myofascial pain dysfunction syndrome 02/20/2021  ? Connective tissue disease ([place]) 10/31/2020  ? Chronic shoulder pain 07/07/2020  ? Mild intermittent asthma without complication 70/62/3762  ? Traumatic incomplete tear of left rotator cuff 04/23/2020  ? Arthralgia 08/03/2019  ? Gastric polyp   ? Benign neoplasm of transverse colon   ? Internal hemorrhoids   ? Malignant neoplasm of skin 06/19/2018  ? Chronic pain syndrome 06/19/2018  ? 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  ? Kidney stone 02/20/2018  ? Renal lesion 02/20/2018  ? Benign hypertension 01/21/2018  ? Pelvic pain 12/26/2017  ? Major depression in remission ([place]) 12/26/2017  ? GERD (gastroesophageal reflux disease) 11/09/2017  ? Elevated beta-2 microglobulin 07/11/2017  ? Right thyroid nodule 05/26/2017  ? Mild hypercholesterolemia 05/06/2017  ? Generalized osteoarthritis of hand 12/13/2016  ? Impingement syndrome of shoulder region, left 11/29/2016  ? Anxiety   ? Allergic rhinitis   ? Insomnia   ? Vitamin D deficiency disease   ? ? ?Past Surgical History:  ?Procedure Laterality Date  ? ABDOMINAL HYSTERECTOMY  2005ish  ? fibroids, ovaries remain; along with bladder tack  ? BACK SURGERY    ?  Lower back  ? bladder sling mesh removal  08/2018  ? bladder tack  2005ish  ? CHOLECYSTECTOMY    ? COLONOSCOPY WITH PROPOFOL N/A 06/22/2018  ? Procedure: COLONOSCOPY WITH PROPOFOL;  Surgeon: Virgel Manifold, MD;  Location: [facility];  Service: Endoscopy;  Laterality: N/A;  ? CYSTO WITH HYDRODISTENSION N/A 05/02/2018  ? Procedure: CYSTOSCOPY/HYDRODISTENSION;  Surgeon: Royston Cowper, MD;  Location: [facility];  Service: Urology;  Laterality: N/A;  ? ESOPHAGOGASTRODUODENOSCOPY (EGD) WITH PROPOFOL N/A 06/22/2018  ? Procedure: ESOPHAGOGASTRODUODENOSCOPY (EGD) WITH PROPOFOL;  Surgeon: Virgel Manifold, MD;  Location: [facility];  Service: Endoscopy;  Laterality: N/A;  ? HEMI-MICRODISCECTOMY LUMBAR LAMINECTOMY LEVEL 1 Left 04/27/2021  ? Procedure: LEFT L4-5 HEMILAMINECTOMY;  Surgeon: Deetta Perla, MD;  Location: [facility];  Service: Neurosurgery;  Laterality: Left;  ? INCONTINENCE SURGERY    ? ROTATOR CUFF REPAIR Left 01/08/2019  ? rotator cuff revision Left 12/06/2019  ? SKIN CANCER EXCISION  06/2014  ? removed from back  ? ? ?Family History  ?Problem Relation Age of Onset  ? Hyperlipidemia Mother   ? Diabetes Sister   ? Hyperlipidemia Sister   ? Hypertension Sister   ? Hypertension Brother   ? Hypertension Son   ? Kidney Stones Son   ? Pneumonia Maternal Grandmother   ? Heart attack Maternal Grandfather   ? Hypertension Sister   ? Epilepsy Sister   ? Hypertension Sister   ? Hypertension Daughter   ? Kidney Stones Daughter   ?  Cancer Neg Hx   ? COPD Neg Hx   ? Stroke Neg Hx   ? ? ?Social History  ? ?Tobacco Use  ? Smoking status: Never  ? Smokeless tobacco: Never  ?Substance Use Topics  ? Alcohol use: Yes  ?  Comment: occ  ? ? ? ?Current Outpatient Medications:  ?  acetaminophen (TYLENOL) 500 MG tablet, Take 1,000 mg by mouth every 8 (eight) hours as needed for moderate pain., Disp: , Rfl:  ?  albuterol (PROVENTIL) (2.5 MG/3ML) 0.083% nebulizer solution, Take 3 mLs (2.5 mg total) by nebulization every 4  (four) hours as needed for wheezing or shortness of breath., Disp: 75 mL, Rfl: 1 ?  albuterol (VENTOLIN HFA) 108 (90 Base) MCG/ACT inhaler, Inhale 2 puffs into the lungs 4 (four) times daily as needed for shortness of breath or wheezing., Disp: , Rfl:  ?  blood glucose meter kit and supplies, Dispense based on patient and insurance preference. Use up to four times daily as directed. (FOR ICD-10 E10.9, E11.9)., Disp: 1 each, Rfl: 0 ?  Cholecalciferol (VITAMIN D) 2000 units tablet, Take 2,000 Units by mouth daily. , Disp: , Rfl:  ?  cyclobenzaprine (FLEXERIL) 10 MG tablet, TAKE 1 TABLET BY MOUTH ONCE DAILY AS NEEDED, Disp: 90 tablet, Rfl: 0 ?  DULoxetine (CYMBALTA) 30 MG capsule, Take 1 capsule (30 mg total) by mouth daily., Disp: 90 capsule, Rfl: 1 ?  fluticasone (FLONASE) 50 MCG/ACT nasal spray, Place into both nostrils., Disp: , Rfl:  ?  ibuprofen (ADVIL) 200 MG tablet, Take 400-600 mg by mouth every 8 (eight) hours as needed for moderate pain., Disp: , Rfl:  ?  levocetirizine (XYZAL) 5 MG tablet, Take 1 tablet (5 mg total) by mouth every evening., Disp: 90 tablet, Rfl: 1 ?  montelukast (SINGULAIR) 10 MG tablet, Take 1 tablet (10 mg total) by mouth at bedtime., Disp: 90 tablet, Rfl: 1 ?  olmesartan (BENICAR) 40 MG tablet, Take 1 tablet (40 mg total) by mouth daily., Disp: 90 tablet, Rfl: 1 ?  omeprazole (PRILOSEC) 20 MG capsule, Take 1 capsule (20 mg total) by mouth every morning., Disp: 90 capsule, Rfl: 1 ? ?Allergies  ?Allergen Reactions  ? Betadine [Povidone Iodine] Swelling, Dermatitis and Rash  ?  Pt states site gets infected  ? Metrizamide Swelling  ?  Noted in [place]. chart on 01/06/2012. Premedicate patient prior to contrast media injections.  ? Other   ?  [place]: vomiting   ? Salicylic Acid-Sulfur   ?  Burst eye vessels  ? Bee Pollen   ?  Sneezing, watery eyes, coughing.   ? Meloxicam Itching  ? Pollen Extract Other (See Comments)  ?  Sneezing, watery eyes, coughing.   ? Sulindac Anxiety and Other (See  Comments)  ? Compazine  [Prochlorperazine] Other (See Comments)  ?  Jaws locked up  ? Contrast Media [Iodinated Contrast Media] Swelling  ?  Noted in [place]. chart on 01/06/2012. Premedicate patient prior to contrast media injections.  ? Doxycycline Nausea Only  ?  GI upset  ? Elemental Sulfur Other (See Comments)  ?  Made all blood vessels in eyes burst  ? Gabapentin Nausea And Vomiting  ?  Lip numbness   ? Keppra [Levetiracetam]   ?  Pt was not herself   ? Lyrica [Pregabalin]   ?  Tingling, depression, blister mouth   ? Rosuvastatin   ?  Mental fogginess, confusion, fatigue   ? Codeine Nausea And Vomiting  ? Hydroxychloroquine  Nausea And Vomiting and Nausea Only  ?  Ear clicking  ? Iodine Swelling  ? Sulfa Antibiotics Nausea And Vomiting and Other (See Comments)  ? ? ?I personally reviewed active problem list, medication list, allergies, family history, social history with the patient/caregiver today. ? ? ?ROS ? ?Ten systems reviewed and is negative except as mentioned in HPI  ? ?Objective ? ?Vitals:  ? 03/19/22 0815  ?BP: 126/84  ?Pulse: 80  ?Resp: 16  ?SpO2: 94%  ?Weight: 166 lb (75.3 kg)  ?Height: 5' 3"  (1.6 m)  ? ? ?Body mass index is 29.41 kg/m?. ? ?Physical Exam ? ?Constitutional: Patient appears well-developed and well-nourished.  No distress.  ?HEENT: head atraumatic, normocephalic, pupils equal and reactive to light, neck supple ?Cardiovascular: Normal rate, regular rhythm and normal heart sounds.  No murmur heard. No BLE edema. Palpable and visible vein on left popliteal fossa and upper calf , no redness or soreness to touch , no calf tenderness  ?Pulmonary/Chest: Effort normal and breath sounds normal. No respiratory distress. ?Abdominal: Soft.  There is no tenderness. ?Psychiatric: Patient has a normal mood and affect. behavior is normal. Judgment and thought content normal.  ? ?Recent Results (from the past 2160 hour(s))  ?HgB A1c     Status: None  ? Collection Time: 03/10/22 10:51 AM  ?Result Value  Ref Range  ? Hgb A1c MFr Bld 5.3 <5.7 % of total Hgb  ?  Comment: For the purpose of screening for the presence of ?diabetes: ?. ?<5.7%       Consistent with the absence of diabetes ?5.7-6.4%    Consis

## 2022-03-19 ENCOUNTER — Encounter: Payer: Self-pay | Admitting: Family Medicine

## 2022-03-19 ENCOUNTER — Ambulatory Visit (INDEPENDENT_AMBULATORY_CARE_PROVIDER_SITE_OTHER): Payer: BC Managed Care – PPO | Admitting: Family Medicine

## 2022-03-19 VITALS — BP 126/84 | HR 80 | Resp 16 | Ht 63.0 in | Wt 166.0 lb

## 2022-03-19 DIAGNOSIS — I83892 Varicose veins of left lower extremities with other complications: Secondary | ICD-10-CM

## 2022-03-19 DIAGNOSIS — Z1211 Encounter for screening for malignant neoplasm of colon: Secondary | ICD-10-CM

## 2022-03-19 NOTE — Assessment & Plan Note (Signed)
Explained it does not look like a DVT or phlebitis  ?Compression stocking hoses likely not beneficial since it is located on popliteal fossa ?Referral placed for vascular surgeon ? ?

## 2022-03-29 ENCOUNTER — Other Ambulatory Visit: Payer: Self-pay | Admitting: Family Medicine

## 2022-03-29 ENCOUNTER — Other Ambulatory Visit: Payer: Self-pay | Admitting: Obstetrics and Gynecology

## 2022-03-29 DIAGNOSIS — Z1231 Encounter for screening mammogram for malignant neoplasm of breast: Secondary | ICD-10-CM

## 2022-04-07 NOTE — Progress Notes (Unsigned)
Name: Yolanda Pace   MRN: [id number]    DOB: 09-Jan-1966   Date:04/08/2022       Progress Note  Subjective  Chief Complaint  Follow Up  HPI  She came in for a 6 weeks follow up on depression, we adjusted Duloxetine from 20 mg to 30 mg, she is taking it at night and has noticed improvement of her mood and also OCD ( not washing her hands as often and not wearing a surgical mask everywhere) but has noticed difficulty sleeping. Mind is very busy at night. Discussed how to move the Duloxetine dose for the morning and we will try hydroxizine for sleep   Patient Active Problem List   Diagnosis Date Noted   Symptomatic varicose veins, left 03/19/2022   Palpitations 07/13/2021   Diabetes mellitus with microalbuminuria ([place]) 02/23/2021   Fibromyalgia 02/20/2021   Myofascial pain dysfunction syndrome 02/20/2021   Connective tissue disease ([place]) 10/31/2020   Chronic shoulder pain 07/07/2020   Mild intermittent asthma without complication 81/11/7508   Traumatic incomplete tear of left rotator cuff 04/23/2020   Arthralgia 08/03/2019   Gastric polyp    Benign neoplasm of transverse colon    Internal hemorrhoids    Malignant neoplasm of skin 06/19/2018   Chronic pain syndrome 25/85/2778   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   Kidney stone 02/20/2018   Renal lesion 02/20/2018   Benign hypertension 01/21/2018   Pelvic pain 12/26/2017   Major depression in remission ([place]) 12/26/2017   GERD (gastroesophageal reflux disease) 11/09/2017   Elevated beta-2 microglobulin 07/11/2017   Right thyroid nodule 05/26/2017   Mild hypercholesterolemia 05/06/2017   Generalized osteoarthritis of hand 12/13/2016   Impingement syndrome of shoulder region, left 11/29/2016   Anxiety    Allergic rhinitis    Insomnia    Vitamin D deficiency disease     Past Surgical History:  Procedure Laterality Date   ABDOMINAL HYSTERECTOMY  2005ish   fibroids, ovaries remain; along with bladder  tack   BACK SURGERY     Lower back   bladder sling mesh removal  08/2018   bladder tack  2005ish   CHOLECYSTECTOMY     COLONOSCOPY WITH PROPOFOL N/A 06/22/2018   Procedure: COLONOSCOPY WITH PROPOFOL;  Surgeon: Virgel Manifold, MD;  Location: [facility];  Service: Endoscopy;  Laterality: N/A;   CYSTO WITH HYDRODISTENSION N/A 05/02/2018   Procedure: CYSTOSCOPY/HYDRODISTENSION;  Surgeon: Royston Cowper, MD;  Location: [facility];  Service: Urology;  Laterality: N/A;   ESOPHAGOGASTRODUODENOSCOPY (EGD) WITH PROPOFOL N/A 06/22/2018   Procedure: ESOPHAGOGASTRODUODENOSCOPY (EGD) WITH PROPOFOL;  Surgeon: Virgel Manifold, MD;  Location: [facility];  Service: Endoscopy;  Laterality: N/A;   HEMI-MICRODISCECTOMY LUMBAR LAMINECTOMY LEVEL 1 Left 04/27/2021   Procedure: LEFT L4-5 HEMILAMINECTOMY;  Surgeon: Deetta Perla, MD;  Location: [facility];  Service: Neurosurgery;  Laterality: Left;   INCONTINENCE SURGERY     ROTATOR CUFF REPAIR Left 01/08/2019   rotator cuff revision Left 12/06/2019   SKIN CANCER EXCISION  06/2014   removed from back    Family History  Problem Relation Age of Onset   Hyperlipidemia Mother    Diabetes Sister    Hyperlipidemia Sister    Hypertension Sister    Hypertension Brother    Hypertension Son    Kidney Stones Son    Pneumonia Maternal Grandmother    Heart attack Maternal Grandfather    Hypertension Sister    Epilepsy Sister    Hypertension Sister    Hypertension Daughter  Kidney Stones Daughter    Cancer Neg Hx    COPD Neg Hx    Stroke Neg Hx     Social History   Tobacco Use   Smoking status: Never   Smokeless tobacco: Never  Substance Use Topics   Alcohol use: Yes    Comment: occ     Current Outpatient Medications:    acetaminophen (TYLENOL) 500 MG tablet, Take 1,000 mg by mouth every 8 (eight) hours as needed for moderate pain., Disp: , Rfl:    albuterol (PROVENTIL) (2.5 MG/3ML) 0.083% nebulizer solution, Take 3 mLs (2.5 mg  total) by nebulization every 4 (four) hours as needed for wheezing or shortness of breath., Disp: 75 mL, Rfl: 1   albuterol (VENTOLIN HFA) 108 (90 Base) MCG/ACT inhaler, Inhale 2 puffs into the lungs 4 (four) times daily as needed for shortness of breath or wheezing., Disp: , Rfl:    blood glucose meter kit and supplies, Dispense based on patient and insurance preference. Use up to four times daily as directed. (FOR ICD-10 E10.9, E11.9)., Disp: 1 each, Rfl: 0   Cholecalciferol (VITAMIN D) 2000 units tablet, Take 2,000 Units by mouth daily. , Disp: , Rfl:    cyclobenzaprine (FLEXERIL) 10 MG tablet, TAKE 1 TABLET BY MOUTH ONCE DAILY AS NEEDED, Disp: 90 tablet, Rfl: 0   DULoxetine (CYMBALTA) 30 MG capsule, Take 1 capsule (30 mg total) by mouth daily., Disp: 90 capsule, Rfl: 1   fluticasone (FLONASE) 50 MCG/ACT nasal spray, Place into both nostrils., Disp: , Rfl:    ibuprofen (ADVIL) 200 MG tablet, Take 400-600 mg by mouth every 8 (eight) hours as needed for moderate pain., Disp: , Rfl:    levocetirizine (XYZAL) 5 MG tablet, Take 1 tablet (5 mg total) by mouth every evening., Disp: 90 tablet, Rfl: 1   montelukast (SINGULAIR) 10 MG tablet, Take 1 tablet (10 mg total) by mouth at bedtime., Disp: 90 tablet, Rfl: 1   olmesartan (BENICAR) 40 MG tablet, Take 1 tablet (40 mg total) by mouth daily., Disp: 90 tablet, Rfl: 1   omeprazole (PRILOSEC) 20 MG capsule, Take 1 capsule (20 mg total) by mouth every morning., Disp: 90 capsule, Rfl: 1  Allergies  Allergen Reactions   Betadine [Povidone Iodine] Swelling, Dermatitis and Rash    Pt states site gets infected   Metrizamide Swelling    Noted in [facility] chart on 01/06/2012. Premedicate patient prior to contrast media injections.   Other     [place]: vomiting    Salicylic Acid-Sulfur     Burst eye vessels   Bee Pollen     Sneezing, watery eyes, coughing.    Meloxicam Itching   Pollen Extract Other (See Comments)    Sneezing, watery eyes, coughing.     Sulindac Anxiety and Other (See Comments)   Compazine  [Prochlorperazine] Other (See Comments)    Jaws locked up   Contrast Media [Iodinated Contrast Media] Swelling    Noted in [facility]. chart on 01/06/2012. Premedicate patient prior to contrast media injections.   Doxycycline Nausea Only    GI upset   Elemental Sulfur Other (See Comments)    Made all blood vessels in eyes burst   Gabapentin Nausea And Vomiting    Lip numbness    Keppra [Levetiracetam]     Pt was not herself    Lyrica [Pregabalin]     Tingling, depression, blister mouth    Rosuvastatin     Mental fogginess, confusion, fatigue    Codeine  Nausea And Vomiting   Hydroxychloroquine Nausea And Vomiting and Nausea Only    Ear clicking   Iodine Swelling   Sulfa Antibiotics Nausea And Vomiting and Other (See Comments)    I personally reviewed active problem list, medication list, allergies, family history, social history, health maintenance with the patient/caregiver today.   ROS  Ten systems reviewed and is negative except as mentioned in HPI   Objective  Vitals:   04/08/22 1034  BP: 128/82  Pulse: 84  Resp: 16  SpO2: 96%  Weight: 166 lb (75.3 kg)  Height: _0  (1.575 m)    Body mass index is 30.36 kg/m.  Physical Exam  Constitutional: Patient appears well-developed and well-nourished. Obese  No distress.  HEENT: head atraumatic, normocephalic, pupils equal and reactive to light, neck supple Cardiovascular: Normal rate, regular rhythm and normal heart sounds.  No murmur heard. No BLE edema. Pulmonary/Chest: Effort normal and breath sounds normal. No respiratory distress. Abdominal: Soft.  There is no tenderness. Psychiatric: Patient has a normal mood and affect. behavior is normal. Judgment and thought content normal.   Recent Results (from the past 2160 hour(s))  HgB A1c     Status: None   Collection Time: 03/10/22 10:51 AM  Result Value Ref Range   Hgb A1c MFr Bld 5.3 <5.7 % of total Hgb     Comment: For the purpose of screening for the presence of diabetes: . <5.7%       Consistent with the absence of diabetes 5.7-6.4%    Consistent with increased risk for diabetes             (prediabetes) > or =6.5%  Consistent with diabetes . This assay result is consistent with a decreased risk of diabetes. . Currently, no consensus exists regarding use of hemoglobin A1c for diagnosis of diabetes in children. . According to American Diabetes Association (ADA) guidelines, hemoglobin A1c <7.0% represents optimal control in non-pregnant diabetic patients. Different metrics may apply to specific patient populations.  Standards of Medical Care in Diabetes(ADA). .    Mean Plasma Glucose 105 mg/dL   eAG (mmol/L) 5.8 mmol/L  Lipid panel     Status: Abnormal   Collection Time: 03/10/22 10:51 AM  Result Value Ref Range   Cholesterol 214 (H) <200 mg/dL   HDL 63 > OR = 50 mg/dL   Triglycerides 97 <150 mg/dL   LDL Cholesterol (Calc) 131 (H) mg/dL (calc)    Comment: Reference range: <100 . Desirable range <100 mg/dL for primary prevention;   <70 mg/dL for patients with CHD or diabetic patients  with > or = 2 CHD risk factors. Marland Kitchen LDL-C is now calculated using the Yolanda-Hopkins  calculation, which is a validated novel method providing  better accuracy than the Friedewald equation in the  estimation of LDL-C.  Cresenciano Genre et al. Annamaria Helling. 5035;465(68): 2061-2068  (http://education.[employer].com/faq/FAQ164)    Total CHOL/HDL Ratio 3.4 <5.0 (calc)   Non-HDL Cholesterol (Calc) 151 (H) <130 mg/dL (calc)    Comment: For patients with diabetes plus 1 major ASCVD risk  factor, treating to a non-HDL-C goal of <100 mg/dL  (LDL-C of <70 mg/dL) is considered a therapeutic  option.   CBC with Differential/Platelet     Status: None   Collection Time: 03/10/22 10:51 AM  Result Value Ref Range   WBC 7.0 3.8 - 10.8 Thousand/uL   RBC 4.93 3.80 - 5.10 Million/uL   Hemoglobin 14.7 11.7 - 15.5  g/dL   HCT 43.8 35.0 - 45.0 %  MCV 88.8 80.0 - 100.0 fL   MCH 29.8 27.0 - 33.0 pg   MCHC 33.6 32.0 - 36.0 g/dL   RDW 12.3 11.0 - 15.0 %   Platelets 315 140 - 400 Thousand/uL   MPV 9.7 7.5 - 12.5 fL   Neutro Abs 4,186 1,500 - 7,800 cells/uL   Lymphs Abs 2,219 850 - 3,900 cells/uL   Absolute Monocytes 427 200 - 950 cells/uL   Eosinophils Absolute 98 15 - 500 cells/uL   Basophils Absolute 70 0 - 200 cells/uL   Neutrophils Relative % 59.8 %   Total Lymphocyte 31.7 %   Monocytes Relative 6.1 %   Eosinophils Relative 1.4 %   Basophils Relative 1.0 %  COMPLETE METABOLIC PANEL WITH GFR     Status: None   Collection Time: 03/10/22 10:51 AM  Result Value Ref Range   Glucose, Bld 94 65 - 99 mg/dL    Comment: .            Fasting reference interval .    BUN 18 7 - 25 mg/dL   Creat 0.69 0.50 - 1.03 mg/dL   eGFR 102 > OR = 60 mL/min/1.63m    Comment: The eGFR is based on the CKD-EPI 2021 equation. To calculate  the new eGFR from a previous Creatinine or Cystatin C result, go to https://www.kidney.org/professionals/ kdoqi/gfr%5Fcalculator    BUN/Creatinine Ratio NOT APPLICABLE 6 - 22 (calc)   Sodium 141 135 - 146 mmol/L   Potassium 4.7 3.5 - 5.3 mmol/L   Chloride 106 98 - 110 mmol/L   CO2 27 20 - 32 mmol/L   Calcium 10.0 8.6 - 10.4 mg/dL   Total Protein 6.9 6.1 - 8.1 g/dL   Albumin 4.4 3.6 - 5.1 g/dL   Globulin 2.5 1.9 - 3.7 g/dL (calc)   AG Ratio 1.8 1.0 - 2.5 (calc)   Total Bilirubin 0.7 0.2 - 1.2 mg/dL   Alkaline phosphatase (APISO) 63 37 - 153 U/L   AST 10 10 - 35 U/L   ALT 10 6 - 29 U/L  TSH     Status: None   Collection Time: 03/10/22 10:51 AM  Result Value Ref Range   TSH 0.99 0.40 - 4.50 mIU/L    PHQ2/9:    04/08/2022   10:30 AM 03/19/2022    8:11 AM 03/10/2022    9:57 AM 09/08/2021   11:03 AM 07/13/2021    2:56 PM  Depression screen PHQ 2/9  Decreased Interest _0 0 1  Down, Depressed, Hopeless _1 0 1  PHQ - 2 Score _2 0 2  Altered sleeping _3 0 0   Tired, decreased energy _4 0 0  Change in appetite _5 0 0  Feeling bad or failure about yourself  _6 0 0  Trouble concentrating _7 0 0  Moving slowly or fidgety/restless 0 0 0 0 0  Suicidal thoughts 0 0 1 0 0  PHQ-9 Score _8 0 2  Difficult doing work/chores   Very difficult  Not difficult at all    phq 9 is positive   Fall Risk:    04/08/2022   10:30 AM 03/19/2022    8:11 AM 03/10/2022    9:56 AM 09/08/2021   10:54 AM 07/13/2021    2:56 PM  Fall Risk   Falls in the past year? 0 0 0 0 0  Number falls in past yr: 0 0  0 0  Injury with Fall? 0 0  0 0  Risk for fall due to : No Fall Risks No Fall Risks No Fall Risks No Fall Risks   Follow up Falls prevention discussed Falls prevention discussed Falls prevention discussed Falls prevention discussed       Functional Status Survey: Is the patient deaf or have difficulty hearing?: No Does the patient have difficulty seeing, even when wearing glasses/contacts?: No Does the patient have difficulty concentrating, remembering, or making decisions?: Yes Does the patient have difficulty walking or climbing stairs?: Yes Does the patient have difficulty dressing or bathing?: No Does the patient have difficulty doing errands alone such as visiting a doctor's office or shopping?: Yes    Assessment & Plan  Problem List Items Addressed This Visit     Insomnia due to mental condition    We will try Hydroxizine 10-30 mg qhs and if no response switch to Temazepam 15 mg and if no improvement seroquel 25 mg.  Discussed possible side effects with patient Hold flexeril when taking this medications if needed        Relevant Medications   hydrOXYzine (ATARAX) 10 MG tablet   Major depression, recurrent, chronic (HCC) - Primary   Relevant Medications   hydrOXYzine (ATARAX) 10 MG tablet

## 2022-04-08 ENCOUNTER — Encounter: Payer: Self-pay | Admitting: Family Medicine

## 2022-04-08 ENCOUNTER — Ambulatory Visit: Payer: BC Managed Care – PPO | Admitting: Family Medicine

## 2022-04-08 VITALS — BP 128/82 | HR 84 | Resp 16 | Ht 62.0 in | Wt 166.0 lb

## 2022-04-08 DIAGNOSIS — F339 Major depressive disorder, recurrent, unspecified: Secondary | ICD-10-CM

## 2022-04-08 DIAGNOSIS — F5105 Insomnia due to other mental disorder: Secondary | ICD-10-CM | POA: Diagnosis not present

## 2022-04-08 MED ORDER — HYDROXYZINE HCL 10 MG PO TABS: 10.0000 mg | ORAL_TABLET | Freq: Every evening | ORAL | 0 refills | Status: DC | PRN

## 2022-04-08 NOTE — Assessment & Plan Note (Signed)
We will try Hydroxizine 10-30 mg qhs and if no response switch to Temazepam 15 mg and if no improvement seroquel 25 mg.  Discussed possible side effects with patient Hold flexeril when taking this medications if needed

## 2022-04-27 ENCOUNTER — Ambulatory Visit: Payer: BC Managed Care – PPO | Admitting: Dermatology

## 2022-04-27 DIAGNOSIS — L578 Other skin changes due to chronic exposure to nonionizing radiation: Secondary | ICD-10-CM

## 2022-04-27 DIAGNOSIS — D489 Neoplasm of uncertain behavior, unspecified: Secondary | ICD-10-CM

## 2022-04-27 DIAGNOSIS — W57XXXA Bitten or stung by nonvenomous insect and other nonvenomous arthropods, initial encounter: Secondary | ICD-10-CM | POA: Diagnosis not present

## 2022-04-27 DIAGNOSIS — S20162A Insect bite (nonvenomous) of breast, left breast, initial encounter: Secondary | ICD-10-CM | POA: Diagnosis not present

## 2022-04-27 DIAGNOSIS — D225 Melanocytic nevi of trunk: Secondary | ICD-10-CM | POA: Diagnosis not present

## 2022-04-27 DIAGNOSIS — L570 Actinic keratosis: Secondary | ICD-10-CM | POA: Diagnosis not present

## 2022-04-27 DIAGNOSIS — D229 Melanocytic nevi, unspecified: Secondary | ICD-10-CM

## 2022-04-27 HISTORY — DX: Melanocytic nevi, unspecified: D22.9

## 2022-04-27 NOTE — Patient Instructions (Addendum)
Biopsy Wound Care Instructions  Leave the original bandage on for 24 hours if possible.  If the bandage becomes soaked or soiled before that time, it is OK to remove it and examine the wound.  A small amount of post-operative bleeding is normal.  If excessive bleeding occurs, remove the bandage, place gauze over the site and apply continuous pressure (no peeking) over the area for 30 minutes. If this does not work, please call our clinic as soon as possible or page your doctor if it is after hours.   Once a day, cleanse the wound with soap and water. It is fine to shower. If a thick crust develops you may use a Q-tip dipped into dilute hydrogen peroxide (mix 1:1 with water) to dissolve it.  Hydrogen peroxide can slow the healing process, so use it only as needed.    After washing, apply petroleum jelly (Vaseline) or an antibiotic ointment if your doctor prescribed one for you, followed by a bandage.    For best healing, the wound should be covered with a layer of ointment at all times. If you are not able to keep the area covered with a bandage to hold the ointment in place, this may mean re-applying the ointment several times a day.  Continue this wound care until the wound has healed and is no longer open.   Itching and mild discomfort is normal during the healing process. However, if you develop pain or severe itching, please call our office.   If you have any discomfort, you can take Tylenol (acetaminophen) or ibuprofen as directed on the bottle. (Please do not take these if you have an allergy to them or cannot take them for another reason).  Some redness, tenderness and white or yellow material in the wound is normal healing.  If the area becomes very sore and red, or develops a thick yellow-green material (pus), it may be infected; please notify us.    If you have stitches, return to clinic as directed to have the stitches removed. You will continue wound care for 2-3 days after the stitches  are removed.   Wound healing continues for up to one year following surgery. It is not unusual to experience pain in the scar from time to time during the interval.  If the pain becomes severe or the scar thickens, you should notify the office.    A slight amount of redness in a scar is expected for the first six months.  After six months, the redness will fade and the scar will soften and fade.  The color difference becomes less noticeable with time.  If there are any problems, return for a post-op surgery check at your earliest convenience.  To improve the appearance of the scar, you can use silicone scar gel, cream, or sheets (such as Mederma or Serica) every night for up to one year. These are available over the counter (without a prescription).  Please call our office at [phone] for any questions or concerns.    Actinic keratoses are precancerous spots that appear secondary to cumulative UV radiation exposure/sun exposure over time. They are chronic with expected duration over 1 year. A portion of actinic keratoses will progress to squamous cell carcinoma of the skin. It is not possible to reliably predict which spots will progress to skin cancer and so treatment is recommended to prevent development of skin cancer.  Recommend daily broad spectrum sunscreen SPF 30+ to sun-exposed areas, reapply every 2 hours as needed.    Recommend staying in the shade or wearing long sleeves, sun glasses (UVA+UVB protection) and wide brim hats (4-inch brim around the entire circumference of the hat). Call for new or changing lesions.    Cryotherapy Aftercare  Wash gently with soap and water everyday.   Apply Vaseline and Band-Aid daily until healed.    Due to recent changes in healthcare laws, you may see results of your pathology and/or laboratory studies on MyChart before the doctors have had a chance to review them. We understand that in some cases there may be results that are confusing or  concerning to you. Please understand that not all results are received at the same time and often the doctors may need to interpret multiple results in order to provide you with the best plan of care or course of treatment. Therefore, we ask that you please give us 2 business days to thoroughly review all your results before contacting the office for clarification. Should we see a critical lab result, you will be contacted sooner.   If You Need Anything After Your Visit  If you have any questions or concerns for your doctor, please call our main line at [phone] and press option 4 to reach your doctor's medical assistant. If no one answers, please leave a voicemail as directed and we will return your call as soon as possible. Messages left after 4 pm will be answered the following business day.   You may also send us a message via MyChart. We typically respond to MyChart messages within 1-2 business days.  For prescription refills, please ask your pharmacy to contact our office. Our fax number is [phone].  If you have an urgent issue when the clinic is closed that cannot wait until the next business day, you can page your doctor at the number below.    Please note that while we do our best to be available for urgent issues outside of office hours, we are not available 24/7.   If you have an urgent issue and are unable to reach us, you may choose to seek medical care at your doctor's office, retail clinic, urgent care center, or emergency room.  If you have a medical emergency, please immediately call 911 or go to the emergency department.  Pager Numbers  - Dr. Kowalski: [phone]  - Dr. Moye: [phone]  - Dr. Stewart: [phone]  In the event of inclement weather, please call our main line at [phone] for an update on the status of any delays or closures.  Dermatology Medication Tips: Please keep the boxes that topical medications come in in order to help keep track  of the instructions about where and how to use these. Pharmacies typically print the medication instructions only on the boxes and not directly on the medication tubes.   If your medication is too expensive, please contact our office at [phone] option 4 or send us a message through MyChart.   We are unable to tell what your co-pay for medications will be in advance as this is different depending on your insurance coverage. However, we may be able to find a substitute medication at lower cost or fill out paperwork to get insurance to cover a needed medication.   If a prior authorization is required to get your medication covered by your insurance company, please allow us 1-2 business days to complete this process.  Drug prices often vary depending on where the prescription is filled and some pharmacies may offer cheaper prices.  The website   www.goodrx.com contains coupons for medications through different pharmacies. The prices here do not account for what the cost may be with help from insurance (it may be cheaper with your insurance), but the website can give you the price if you did not use any insurance.  - You can print the associated coupon and take it with your prescription to the pharmacy.  - You may also stop by our office during regular business hours and pick up a GoodRx coupon card.  - If you need your prescription sent electronically to a different pharmacy, notify our office through [facility] MyChart or by phone at [phone] option 4.     Si Usted Necesita Algo Despus de Su Visita  Tambin puede enviarnos un mensaje a travs de MyChart. Por lo general respondemos a los mensajes de MyChart en el transcurso de 1 a 2 das hbiles.  Para renovar recetas, por favor pida a su farmacia que se ponga en contacto con nuestra oficina. Nuestro nmero de fax es el [phone].  Si tiene un asunto urgente cuando la clnica est cerrada y que no puede esperar hasta el siguiente da  hbil, puede llamar/localizar a su doctor(a) al nmero que aparece a continuacin.   Por favor, tenga en cuenta que aunque hacemos todo lo posible para estar disponibles para asuntos urgentes fuera del horario de oficina, no estamos disponibles las 24 horas del da, los 7 das de la semana.   Si tiene un problema urgente y no puede comunicarse con nosotros, puede optar por buscar atencin mdica  en el consultorio de su doctor(a), en una clnica privada, en un centro de atencin urgente o en una sala de emergencias.  Si tiene una emergencia mdica, por favor llame inmediatamente al 911 o vaya a la sala de emergencias.  Nmeros de bper  - Dr. Kowalski: [phone]  - Dra. Moye: [phone]  - Dra. Stewart: [phone]  En caso de inclemencias del tiempo, por favor llame a nuestra lnea principal al [phone] para una actualizacin sobre el estado de cualquier retraso o cierre.  Consejos para la medicacin en dermatologa: Por favor, guarde las cajas en las que vienen los medicamentos de uso tpico para ayudarle a seguir las instrucciones sobre dnde y cmo usarlos. Las farmacias generalmente imprimen las instrucciones del medicamento slo en las cajas y no directamente en los tubos del medicamento.   Si su medicamento es muy caro, por favor, pngase en contacto con nuestra oficina llamando al [phone] y presione la opcin 4 o envenos un mensaje a travs de MyChart.   No podemos decirle cul ser su copago por los medicamentos por adelantado ya que esto es diferente dependiendo de la cobertura de su seguro. Sin embargo, es posible que podamos encontrar un medicamento sustituto a menor costo o llenar un formulario para que el seguro cubra el medicamento que se considera necesario.   Si se requiere una autorizacin previa para que su compaa de seguros cubra su medicamento, por favor permtanos de 1 a 2 das hbiles para completar este proceso.  Los precios de los medicamentos  varan con frecuencia dependiendo del lugar de dnde se surte la receta y alguna farmacias pueden ofrecer precios ms baratos.  El sitio web www.goodrx.com tiene cupones para medicamentos de diferentes farmacias. Los precios aqu no tienen en cuenta lo que podra costar con la ayuda del seguro (puede ser ms barato con su seguro), pero el sitio web puede darle el precio si no utiliz ningn seguro.  - Puede imprimir   imprimir el cupn correspondiente y llevarlo con su receta a la farmacia.  - Tambin puede pasar por nuestra oficina durante el horario de atencin regular y recoger una tarjeta de cupones de GoodRx.  - Si necesita que su receta se enve electrnicamente a una farmacia diferente, informe a nuestra oficina a travs de MyChart de [place] o por telfono llamando al [phone] y presione la opcin 4.  

## 2022-04-27 NOTE — Progress Notes (Signed)
Follow-Up Visit   Subjective  Yolanda Pace is a 56 y.o. female who presents for the following: Skin Problem (Patient reports she noticed 2 days ago, a spot at left side under bra. Denies pain with area. Also reports a spot at right brow, right cheek and a tick bite at left breast.).   The patient has spots, moles and lesions to be evaluated, some may be new or changing and the patient has concerns that these could be cancer.  The following portions of the chart were reviewed this encounter and updated as appropriate:      Review of Systems: No other skin or systemic complaints except as noted in HPI or Assessment and Plan.   Objective  Well appearing patient in no apparent distress; mood and affect are within normal limits.  A focused examination was performed including face, left breast, left . Relevant physical exam findings are noted in the Assessment and Plan.  Left Breast Pink edematous papule at left breast  left anterior flank at inframammary 0.7 cm medium brown macule with darker center and edge        right zygoma x 1, right upper eyebrow x 1 (2) Erythematous thin papules/macules with gritty scale.    Assessment & Plan  Bug bite without infection, initial encounter Left Breast  Benign, observe for expanding red rash  Discussed could prescribe topical steroid cream for itch.  Patient deferred treatment at this time.   Neoplasm of uncertain behavior left anterior flank at inframammary  Epidermal / dermal shaving  Lesion diameter (cm):  1 Informed consent: discussed and consent obtained   Patient was prepped and draped in usual sterile fashion: Area prepped with alcohol. Anesthesia: the lesion was anesthetized in a standard fashion   Anesthetic:  1% lidocaine w/ epinephrine 1-100,000 buffered w/ 8.4% NaHCO3 Instrument used: flexible razor blade   Hemostasis achieved with: pressure, aluminum chloride and electrodesiccation   Outcome: patient tolerated  procedure well   Post-procedure details: wound care instructions given   Post-procedure details comment:  Ointment and small bandage applied.   Specimen 1 - Surgical pathology Differential Diagnosis: Nevus r/o dysplasia   Check Margins: No  Nevus r/o dysplasia   Actinic keratosis (2) right zygoma x 1, right upper eyebrow x 1  Actinic keratoses are precancerous spots that appear secondary to cumulative UV radiation exposure/sun exposure over time. They are chronic with expected duration over 1 year. A portion of actinic keratoses will progress to squamous cell carcinoma of the skin. It is not possible to reliably predict which spots will progress to skin cancer and so treatment is recommended to prevent development of skin cancer.  Recommend daily broad spectrum sunscreen SPF 30+ to sun-exposed areas, reapply every 2 hours as needed.  Recommend staying in the shade or wearing long sleeves, sun glasses (UVA+UVB protection) and wide brim hats (4-inch brim around the entire circumference of the hat). Call for new or changing lesions.  Destruction of lesion - right zygoma x 1, right upper eyebrow x 1  Destruction method: cryotherapy   Informed consent: discussed and consent obtained   Lesion destroyed using liquid nitrogen: Yes   Region frozen until ice ball extended beyond lesion: Yes   Outcome: patient tolerated procedure well with no complications   Post-procedure details: wound care instructions given   Additional details:  Prior to procedure, discussed risks of blister formation, small wound, skin dyspigmentation, or rare scar following cryotherapy. Recommend Vaseline ointment to treated areas while healing.  Actinic skin damage Neck - Anterior  - chronic, secondary to cumulative UV radiation exposure/sun exposure over time - diffuse scaly erythematous macules with underlying dyspigmentation - Recommend daily broad spectrum sunscreen SPF 30+ to sun-exposed areas, reapply every 2  hours as needed.  - Recommend staying in the shade or wearing long sleeves, sun glasses (UVA+UVB protection) and wide brim hats (4-inch brim around the entire circumference of the hat). - Call for new or changing lesions.   Discussed the treatment option of BBL/laser.  Typically we recommend 1-3 treatment sessions about 5-8 weeks apart for best results.  The patient's condition may require "maintenance treatments" in the future.  The fee for BBL / laser treatments is $350 per treatment session for the whole face.  A fee can be quoted for other parts of the body. Insurance typically does not pay for BBL/laser treatments and therefore the fee is an out-of-pocket cost.    Return if symptoms worsen or fail to improve, for keep follow up as schedule 06/15/2022. I, Ruthell Rummage, CMA, am acting as scribe for Brendolyn Patty, MD.  Documentation: I have reviewed the above documentation for accuracy and completeness, and I agree with the above.  Brendolyn Patty MD

## 2022-05-01 ENCOUNTER — Other Ambulatory Visit: Payer: Self-pay | Admitting: Neurosurgery

## 2022-05-01 DIAGNOSIS — M5136 Other intervertebral disc degeneration, lumbar region: Secondary | ICD-10-CM

## 2022-05-01 DIAGNOSIS — M5416 Radiculopathy, lumbar region: Secondary | ICD-10-CM

## 2022-05-03 ENCOUNTER — Telehealth: Payer: Self-pay

## 2022-05-03 NOTE — Telephone Encounter (Signed)
Advised pt of bx results/sh ?

## 2022-05-03 NOTE — Telephone Encounter (Signed)
-----   Message from Brendolyn Patty, MD sent at 05/03/2022  9:54 AM EDT ----- Skin , left anterior flank at inframammary [place], LATERAL MARGIN INVOLVED  Mildly atypical mole, recommend observation   - please call patient

## 2022-05-10 ENCOUNTER — Ambulatory Visit: Payer: BC Managed Care – PPO | Admitting: Family Medicine

## 2022-05-10 ENCOUNTER — Encounter: Payer: Self-pay | Admitting: Family Medicine

## 2022-05-10 VITALS — BP 128/80 | HR 86 | Temp 98.3°F | Resp 16 | Ht 63.0 in | Wt 170.4 lb

## 2022-05-10 DIAGNOSIS — F5105 Insomnia due to other mental disorder: Secondary | ICD-10-CM

## 2022-05-10 DIAGNOSIS — F339 Major depressive disorder, recurrent, unspecified: Secondary | ICD-10-CM | POA: Diagnosis not present

## 2022-05-10 MED ORDER — TEMAZEPAM 7.5 MG PO CAPS: 7.5000 mg | ORAL_CAPSULE | Freq: Every evening | ORAL | 0 refills | Status: DC | PRN

## 2022-05-10 NOTE — Assessment & Plan Note (Signed)
She could not tolerate hydroxyzine , made her groggy, we will try temazepam and if not able to tolerate she will call back for trazodone Discussed possible side effects and also that it is a controlled substance

## 2022-05-14 ENCOUNTER — Encounter (INDEPENDENT_AMBULATORY_CARE_PROVIDER_SITE_OTHER): Payer: BC Managed Care – PPO

## 2022-05-14 ENCOUNTER — Ambulatory Visit
Admission: RE | Admit: 2022-05-14 | Discharge: 2022-05-14 | Disposition: A | Discharge status: Home / Self Care | Payer: BC Managed Care – PPO | Source: Ambulatory Visit | Attending: Family Medicine | Admitting: Family Medicine

## 2022-05-14 ENCOUNTER — Encounter (INDEPENDENT_AMBULATORY_CARE_PROVIDER_SITE_OTHER): Payer: BC Managed Care – PPO | Admitting: Vascular Surgery

## 2022-05-14 DIAGNOSIS — Z1231 Encounter for screening mammogram for malignant neoplasm of breast: Secondary | ICD-10-CM | POA: Insufficient documentation

## 2022-05-17 ENCOUNTER — Other Ambulatory Visit: Payer: Self-pay

## 2022-05-17 ENCOUNTER — Other Ambulatory Visit: Payer: Self-pay | Admitting: Family Medicine

## 2022-05-17 DIAGNOSIS — R928 Other abnormal and inconclusive findings on diagnostic imaging of breast: Secondary | ICD-10-CM

## 2022-05-17 DIAGNOSIS — N6489 Other specified disorders of breast: Secondary | ICD-10-CM

## 2022-05-19 ENCOUNTER — Other Ambulatory Visit: Payer: Self-pay | Admitting: Otolaryngology

## 2022-05-19 ENCOUNTER — Other Ambulatory Visit: Payer: Self-pay | Admitting: Family Medicine

## 2022-05-19 DIAGNOSIS — R928 Other abnormal and inconclusive findings on diagnostic imaging of breast: Secondary | ICD-10-CM

## 2022-05-19 DIAGNOSIS — N6489 Other specified disorders of breast: Secondary | ICD-10-CM

## 2022-05-19 DIAGNOSIS — R42 Dizziness and giddiness: Secondary | ICD-10-CM

## 2022-05-31 ENCOUNTER — Telehealth: Payer: Self-pay

## 2022-05-31 MED ORDER — PREDNISONE 50 MG PO TABS: ORAL_TABLET | ORAL | 0 refills | Status: DC

## 2022-05-31 NOTE — Telephone Encounter (Signed)
Phone call to patient to review instructions for 13 hr prep for MRI w/ contrast on 06/08/22  at 9:40 am. Prescription called into [facility]. Pt aware and verbalized understanding of instructions. Pt to take 50 mg of prednisone on 7/24 at 8:40 pm, 50 mg of prednisone on 7/25 at 2:40 am, and 50 mg of prednisone on 7/25 at 8:40 am. Pt is also to take 50 mg of benadryl on 7/25 at 8:40 am. Please call [phone] with any questions.

## 2022-06-01 ENCOUNTER — Ambulatory Visit
Admission: RE | Admit: 2022-06-01 | Discharge: 2022-06-01 | Disposition: A | Discharge status: Home / Self Care | Payer: BC Managed Care – PPO | Source: Ambulatory Visit | Attending: Family Medicine | Admitting: Family Medicine

## 2022-06-01 ENCOUNTER — Other Ambulatory Visit: Payer: Self-pay | Admitting: Family Medicine

## 2022-06-01 DIAGNOSIS — N6489 Other specified disorders of breast: Secondary | ICD-10-CM

## 2022-06-01 DIAGNOSIS — R928 Other abnormal and inconclusive findings on diagnostic imaging of breast: Secondary | ICD-10-CM

## 2022-06-08 ENCOUNTER — Ambulatory Visit
Admission: RE | Admit: 2022-06-08 | Discharge: 2022-06-08 | Disposition: A | Discharge status: Home / Self Care | Payer: BC Managed Care – PPO | Source: Ambulatory Visit | Attending: Otolaryngology | Admitting: Otolaryngology

## 2022-06-08 DIAGNOSIS — R42 Dizziness and giddiness: Secondary | ICD-10-CM

## 2022-06-08 MED ORDER — GADOBENATE DIMEGLUMINE 529 MG/ML IV SOLN
15.0000 mL | Freq: Once | INTRAVENOUS | Status: AC | PRN
  Administered 2022-06-08: 15 mL via INTRAVENOUS

## 2022-06-15 ENCOUNTER — Ambulatory Visit: Payer: BC Managed Care – PPO | Admitting: Dermatology

## 2022-06-15 DIAGNOSIS — L814 Other melanin hyperpigmentation: Secondary | ICD-10-CM

## 2022-06-15 DIAGNOSIS — L578 Other skin changes due to chronic exposure to nonionizing radiation: Secondary | ICD-10-CM | POA: Diagnosis not present

## 2022-06-15 DIAGNOSIS — D2271 Melanocytic nevi of right lower limb, including hip: Secondary | ICD-10-CM | POA: Diagnosis not present

## 2022-06-15 DIAGNOSIS — Z86018 Personal history of other benign neoplasm: Secondary | ICD-10-CM

## 2022-06-15 DIAGNOSIS — D225 Melanocytic nevi of trunk: Secondary | ICD-10-CM

## 2022-06-15 DIAGNOSIS — Z1283 Encounter for screening for malignant neoplasm of skin: Secondary | ICD-10-CM | POA: Diagnosis not present

## 2022-06-15 DIAGNOSIS — D229 Melanocytic nevi, unspecified: Secondary | ICD-10-CM

## 2022-06-15 DIAGNOSIS — L821 Other seborrheic keratosis: Secondary | ICD-10-CM

## 2022-06-15 DIAGNOSIS — D1800 Hemangioma unspecified site: Secondary | ICD-10-CM

## 2022-06-15 DIAGNOSIS — Z85828 Personal history of other malignant neoplasm of skin: Secondary | ICD-10-CM

## 2022-06-15 NOTE — Progress Notes (Signed)
Follow-Up Visit   Subjective  Yolanda Pace is a 56 y.o. female who presents for the following: Annual Exam (The patient presents for Total-Body Skin Exam (TBSE) for skin cancer screening and mole check.  The patient has spots, moles and lesions to be evaluated, some may be new or changing and the patient has concerns that these could be cancer. Patient with hx of SCC, BCC, AK's and dysplastic nevus. ).   The following portions of the chart were reviewed this encounter and updated as appropriate:       Review of Systems:  No other skin or systemic complaints except as noted in HPI or Assessment and Plan.  Objective  Well appearing patient in no apparent distress; mood and affect are within normal limits.  A full examination was performed including scalp, head, eyes, ears, nose, lips, neck, chest, axillae, abdomen, back, buttocks, bilateral upper extremities, bilateral lower extremities, hands, feet, fingers, toes, fingernails, and toenails. All findings within normal limits unless otherwise noted below.  right buttock, right upper anterior thigh 5 mm medium brown macule, darker edge at right buttock 8 x 5 mm medium brown macule at right upper anterior thigh       left central lower lip 2 mm medium dark brown macule, photo compared, no change    Assessment & Plan  Nevus right buttock, right upper anterior thigh  Benign-appearing.  Observation.  Call clinic for new or changing lesions.  Recommend daily use of broad spectrum spf 30+ sunscreen to sun-exposed areas.    Lentigines left central lower lip  Stable, Benign-appearing.  Observation.  Call clinic for new or changing lesions.  Recommend daily use of broad spectrum spf 30+ sunscreen to sun-exposed areas.    Lentigines - Scattered tan macules - Due to sun exposure - Benign-appearing, observe - Recommend daily broad spectrum sunscreen SPF 30+ to sun-exposed areas, reapply every 2 hours as needed. - Call for any  changes  Seborrheic Keratoses - Stuck-on, waxy, tan-brown papules and/or plaques  - Benign-appearing - Discussed benign etiology and prognosis. - Observe - Call for any changes  Melanocytic Nevi - Tan-brown and/or pink-flesh-colored symmetric macules and papules - Benign appearing on exam today - Observation - Call clinic for new or changing moles - Recommend daily use of broad spectrum spf 30+ sunscreen to sun-exposed areas.   Hemangiomas - Red papules - Discussed benign nature - Observe - Call for any changes  Actinic Damage - Chronic condition, secondary to cumulative UV/sun exposure - diffuse scaly erythematous macules with underlying dyspigmentation - Recommend daily broad spectrum sunscreen SPF 30+ to sun-exposed areas, reapply every 2 hours as needed.  - Staying in the shade or wearing long sleeves, sun glasses (UVA+UVB protection) and wide brim hats (4-inch brim around the entire circumference of the hat) are also recommended for sun protection.  - Call for new or changing lesions.  Skin cancer screening performed today.  History of Basal Cell Carcinoma of the Skin - No evidence of recurrence today at left spinal mid back - Recommend regular full body skin exams - Recommend daily broad spectrum sunscreen SPF 30+ to sun-exposed areas, reapply every 2 hours as needed.  - Call if any new or changing lesions are noted between office visits  History of Dysplastic Nevi - No evidence of recurrence today at left anterior flank at inframammary - Recommend regular full body skin exams - Recommend daily broad spectrum sunscreen SPF 30+ to sun-exposed areas, reapply every 2 hours as needed.  -  Call if any new or changing lesions are noted between office visits  History of Squamous Cell Carcinoma of the Skin - No evidence of recurrence today at right forearm - Recommend regular full body skin exams - Recommend daily broad spectrum sunscreen SPF 30+ to sun-exposed areas,  reapply every 2 hours as needed.  - Call if any new or changing lesions are noted between office visits  Return in about 1 year (around 06/16/2023) for TBSE.  Graciella Belton, RMA, am acting as scribe for Brendolyn Patty, MD .  Documentation: I have reviewed the above documentation for accuracy and completeness, and I agree with the above.  Brendolyn Patty MD

## 2022-06-15 NOTE — Patient Instructions (Signed)
Melanoma ABCDEs  Melanoma is the most dangerous type of skin cancer, and is the leading cause of death from skin disease.  You are more likely to develop melanoma if you: Have light-colored skin, light-colored eyes, or red or blond hair Spend a lot of time in the sun Tan regularly, either outdoors or in a tanning bed Have had blistering sunburns, especially during childhood Have a close family member who has had a melanoma Have atypical moles or large birthmarks  Early detection of melanoma is key since treatment is typically straightforward and cure rates are extremely high if we catch it early.   The first sign of melanoma is often a change in a mole or a new dark spot.  The ABCDE system is a way of remembering the signs of melanoma.  A for asymmetry:  The two halves do not match. B for border:  The edges of the growth are irregular. C for color:  A mixture of colors are present instead of an even brown color. D for diameter:  Melanomas are usually (but not always) greater than 6mm - the size of a pencil eraser. E for evolution:  The spot keeps changing in size, shape, and color.  Please check your skin once per month between visits. You can use a small mirror in front and a large mirror behind you to keep an eye on the back side or your body.   If you see any new or changing lesions before your next follow-up, please call to schedule a visit.  Please continue daily skin protection including broad spectrum sunscreen SPF 30+ to sun-exposed areas, reapplying every 2 hours as needed when you're outdoors.    Due to recent changes in healthcare laws, you may see results of your pathology and/or laboratory studies on MyChart before the doctors have had a chance to review them. We understand that in some cases there may be results that are confusing or concerning to you. Please understand that not all results are received at the same time and often the doctors may need to interpret multiple  results in order to provide you with the best plan of care or course of treatment. Therefore, we ask that you please give us 2 business days to thoroughly review all your results before contacting the office for clarification. Should we see a critical lab result, you will be contacted sooner.   If You Need Anything After Your Visit  If you have any questions or concerns for your doctor, please call our main line at [phone] and press option 4 to reach your doctor's medical assistant. If no one answers, please leave a voicemail as directed and we will return your call as soon as possible. Messages left after 4 pm will be answered the following business day.   You may also send us a message via MyChart. We typically respond to MyChart messages within 1-2 business days.  For prescription refills, please ask your pharmacy to contact our office. Our fax number is [phone].  If you have an urgent issue when the clinic is closed that cannot wait until the next business day, you can page your doctor at the number below.    Please note that while we do our best to be available for urgent issues outside of office hours, we are not available 24/7.   If you have an urgent issue and are unable to reach us, you may choose to seek medical care at your doctor's office, retail clinic, urgent care   center, or emergency room.  If you have a medical emergency, please immediately call 911 or go to the emergency department.  Pager Numbers  - Dr. Kowalski: [phone]  - Dr. Moye: [phone]  - Dr. Stewart: [phone]  In the event of inclement weather, please call our main line at [phone] for an update on the status of any delays or closures.  Dermatology Medication Tips: Please keep the boxes that topical medications come in in order to help keep track of the instructions about where and how to use these. Pharmacies typically print the medication instructions only on the boxes and not  directly on the medication tubes.   If your medication is too expensive, please contact our office at [phone] option 4 or send us a message through MyChart.   We are unable to tell what your co-pay for medications will be in advance as this is different depending on your insurance coverage. However, we may be able to find a substitute medication at lower cost or fill out paperwork to get insurance to cover a needed medication.   If a prior authorization is required to get your medication covered by your insurance company, please allow us 1-2 business days to complete this process.  Drug prices often vary depending on where the prescription is filled and some pharmacies may offer cheaper prices.  The website www.goodrx.com contains coupons for medications through different pharmacies. The prices here do not account for what the cost may be with help from insurance (it may be cheaper with your insurance), but the website can give you the price if you did not use any insurance.  - You can print the associated coupon and take it with your prescription to the pharmacy.  - You may also stop by our office during regular business hours and pick up a GoodRx coupon card.  - If you need your prescription sent electronically to a different pharmacy, notify our office through [facility] MyChart or by phone at [phone] option 4.     Si Usted Necesita Algo Despus de Su Visita  Tambin puede enviarnos un mensaje a travs de MyChart. Por lo general respondemos a los mensajes de MyChart en el transcurso de 1 a 2 das hbiles.  Para renovar recetas, por favor pida a su farmacia que se ponga en contacto con nuestra oficina. Nuestro nmero de fax es el [phone].  Si tiene un asunto urgente cuando la clnica est cerrada y que no puede esperar hasta el siguiente da hbil, puede llamar/localizar a su doctor(a) al nmero que aparece a continuacin.   Por favor, tenga en cuenta que aunque hacemos  todo lo posible para estar disponibles para asuntos urgentes fuera del horario de oficina, no estamos disponibles las 24 horas del da, los 7 das de la semana.   Si tiene un problema urgente y no puede comunicarse con nosotros, puede optar por buscar atencin mdica  en el consultorio de su doctor(a), en una clnica privada, en un centro de atencin urgente o en una sala de emergencias.  Si tiene una emergencia mdica, por favor llame inmediatamente al 911 o vaya a la sala de emergencias.  Nmeros de bper  - Dr. Kowalski: [phone]  - Dra. Moye: [phone]  - Dra. Stewart: [phone]  En caso de inclemencias del tiempo, por favor llame a nuestra lnea principal al [phone] para una actualizacin sobre el estado de cualquier retraso o cierre.  Consejos para la medicacin en dermatologa: Por favor, guarde las cajas en las   que vienen los medicamentos de uso tpico para ayudarle a seguir las instrucciones sobre dnde y cmo usarlos. Las farmacias generalmente imprimen las instrucciones del medicamento slo en las cajas y no directamente en los tubos del medicamento.   Si su medicamento es muy caro, por favor, pngase en contacto con nuestra oficina llamando al [phone] y presione la opcin 4 o envenos un mensaje a travs de MyChart.   No podemos decirle cul ser su copago por los medicamentos por adelantado ya que esto es diferente dependiendo de la cobertura de su seguro. Sin embargo, es posible que podamos encontrar un medicamento sustituto a menor costo o llenar un formulario para que el seguro cubra el medicamento que se considera necesario.   Si se requiere una autorizacin previa para que su compaa de seguros cubra su medicamento, por favor permtanos de 1 a 2 das hbiles para completar este proceso.  Los precios de los medicamentos varan con frecuencia dependiendo del lugar de dnde se surte la receta y alguna farmacias pueden ofrecer precios ms baratos.  El  sitio web www.goodrx.com tiene cupones para medicamentos de diferentes farmacias. Los precios aqu no tienen en cuenta lo que podra costar con la ayuda del seguro (puede ser ms barato con su seguro), pero el sitio web puede darle el precio si no utiliz ningn seguro.  - Puede imprimir el cupn correspondiente y llevarlo con su receta a la farmacia.  - Tambin puede pasar por nuestra oficina durante el horario de atencin regular y recoger una tarjeta de cupones de GoodRx.  - Si necesita que su receta se enve electrnicamente a una farmacia diferente, informe a nuestra oficina a travs de MyChart de [facility] o por telfono llamando al [phone] y presione la opcin 4.  

## 2022-06-25 ENCOUNTER — Ambulatory Visit: Payer: BC Managed Care – PPO | Admitting: Physician Assistant

## 2022-06-29 ENCOUNTER — Encounter (INDEPENDENT_AMBULATORY_CARE_PROVIDER_SITE_OTHER): Payer: BC Managed Care – PPO

## 2022-06-29 ENCOUNTER — Encounter (INDEPENDENT_AMBULATORY_CARE_PROVIDER_SITE_OTHER): Payer: BC Managed Care – PPO | Admitting: Vascular Surgery

## 2022-06-29 IMAGING — RF DG C-ARM 1-60 MIN
1 series · 2 of 2 positions shown · non-contrast
Comparison: MRI lumbar spine 12/28/2019.

CLINICAL DATA: Intraoperative localization imaging for patient
undergoing L4-5 lumbar surgery.

EXAM:
LUMBAR SPINE - 2-3 VIEW; DG C-ARM 1-60 MIN

[Series 1: dg x-ray · 0.20mm/px · 2 of 2 slices shown]
[im 1/2]
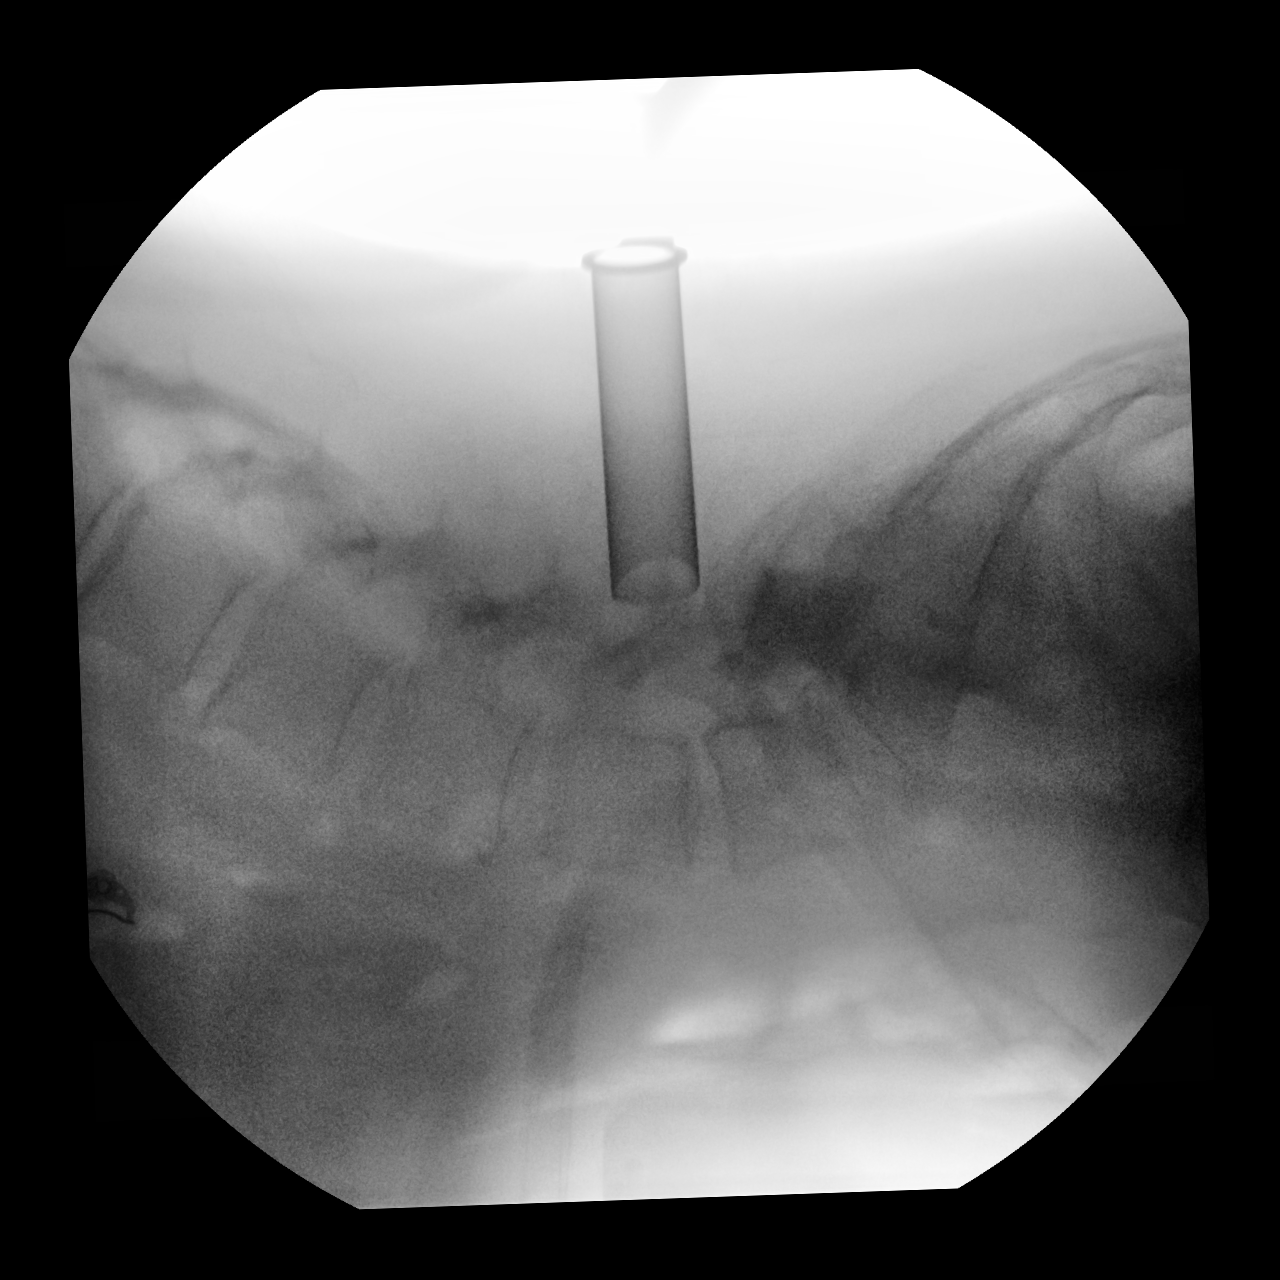
[im 2/2]
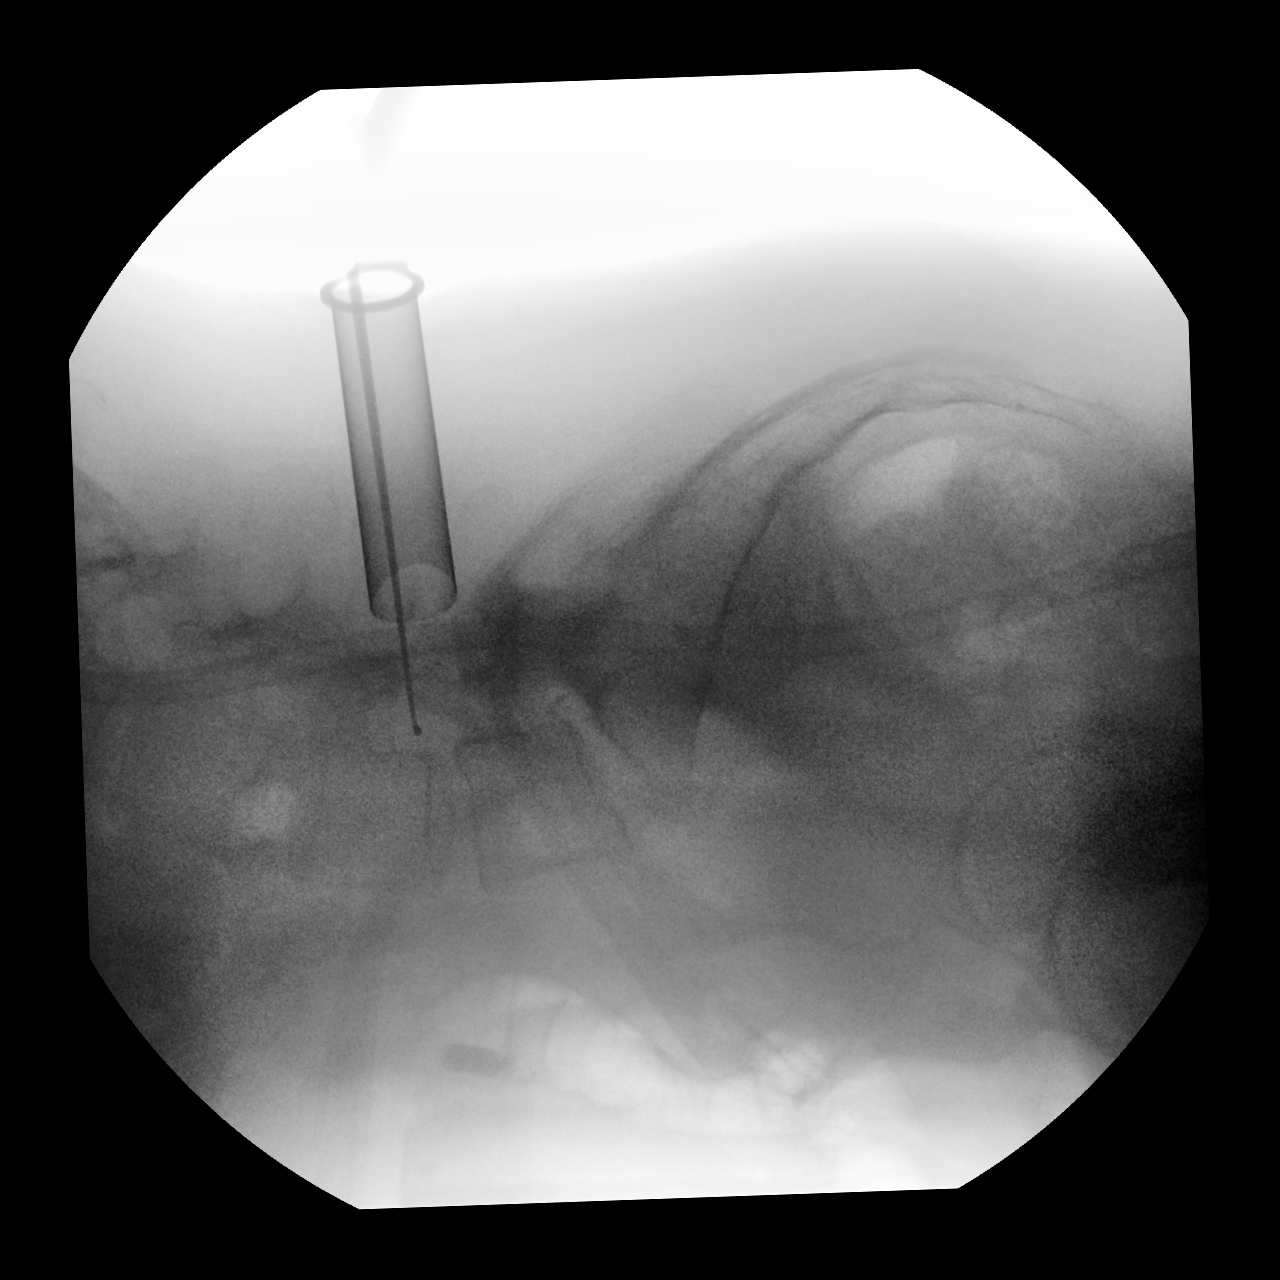

[2 of 2 positions shown; findings below may reference images not displayed]

FINDINGS: Two fluoroscopic intraoperative spot views demonstrate localization
of the L4-5 level. No acute finding.
IMPRESSION: Imaging for L4-5 localization.

## 2022-07-07 ENCOUNTER — Telehealth: Payer: Self-pay

## 2022-07-07 NOTE — Telephone Encounter (Signed)
MR mailed to [facility]: [id number] [facility] [address] [address]

## 2022-08-10 ENCOUNTER — Ambulatory Visit: Payer: BC Managed Care – PPO | Admitting: Family Medicine

## 2022-08-12 ENCOUNTER — Other Ambulatory Visit: Payer: Self-pay | Admitting: Family Medicine

## 2022-08-12 DIAGNOSIS — F5105 Insomnia due to other mental disorder: Secondary | ICD-10-CM

## 2022-08-18 ENCOUNTER — Other Ambulatory Visit: Payer: Self-pay | Admitting: Family Medicine

## 2022-08-18 DIAGNOSIS — K219 Gastro-esophageal reflux disease without esophagitis: Secondary | ICD-10-CM

## 2022-08-18 DIAGNOSIS — I10 Essential (primary) hypertension: Secondary | ICD-10-CM

## 2022-08-18 DIAGNOSIS — J302 Other seasonal allergic rhinitis: Secondary | ICD-10-CM

## 2022-08-21 ENCOUNTER — Other Ambulatory Visit: Payer: Self-pay | Admitting: Family Medicine

## 2022-08-21 DIAGNOSIS — F5105 Insomnia due to other mental disorder: Secondary | ICD-10-CM

## 2022-08-23 ENCOUNTER — Other Ambulatory Visit: Payer: Self-pay

## 2022-08-23 NOTE — Progress Notes (Unsigned)
Name: Yolanda Pace   MRN: [id number]    DOB: [birth year]   Date:08/23/2022       Progress Note  Subjective  Chief Complaint  Follow Up  HPI  Depression: we adjusted Duloxetine from 20 mg to 30 mg, she is taking it at night and has noticed improvement of her mood and also OCD ( not washing her hands as often and not wearing a surgical mask everywhere) , she states some days she feels like she is back to baseline but still has a few days a week that she has lack of motivation, spends the day eating - she does not want to change medications or increase dose of Duloxetine because she is afraid of withdrawals. She tried hdyroxizine and slept well but felt groggy the following day, we will try temazepam and if needed she will contact me and we can try trazodone   Recent biopsy of trunk lesion: still healing, tender to touch, borders not cleared by Dr. Nicole Kindred just to monitor . She has follow up with August   FMS/elevated CRP/Connective Tissue Disease : she was seen by Dr. Jefferson Fuel in the past She states x-rays were done and also labs , inflammatory markers improved and she was given reassurance that is it OA and to continue to monitor.  Takes Duloxetine  but only at 20 mg now , she takes flexeril prn for sleep. . 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 Discussed importance of statin therapy, but she refused again today. We will recheck labs   GERD: controlled with medication, denies heartburn or indigestion . Taking Omeprazole  Mild intermittent asthma: seeing Dr. Humphrey Rolls and is off steroid inhaler, uses rescue and also on singulair . She is not on maintenance steroid at this time. She is not living the house due to fear of germs and not sick in a long time, states the phobia is improving    Chronic  back pain with radiculitis: had lumbar laminectomy done by Dr. Lacinda Axon June 2022 currently taking low dose duloxetine and flexeril prn . No bowel or bladder incontinence. She states pain is getting worse again and had NCS done by Dr. Manuella Ghazi and will follow up with Dr. Phyllis Ginger.   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   Major depression, recurrent, chronic ([place]) 04/08/2022   Symptomatic varicose veins, left 03/19/2022   Palpitations 07/13/2021   Diabetes mellitus with microalbuminuria ([place]) 02/23/2021   Fibromyalgia 02/20/2021   Connective tissue disease ([place]) 10/31/2020   Chronic shoulder pain 07/07/2020   Mild intermittent asthma without complication 09/13/1313   Traumatic incomplete tear of left rotator cuff 04/23/2020   Arthralgia 08/03/2019   Gastric polyp    Benign neoplasm of transverse colon    Internal hemorrhoids    Malignant neoplasm of skin 06/19/2018   Chronic pain syndrome 38/88/7579   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   Kidney stone 02/20/2018   Renal lesion 02/20/2018   Benign hypertension 01/21/2018   Pelvic pain 12/26/2017   GERD (gastroesophageal reflux disease) 11/09/2017   Elevated beta-2 microglobulin 07/11/2017   Right thyroid nodule 05/26/2017   Mild hypercholesterolemia 05/06/2017   Generalized osteoarthritis of hand 12/13/2016  Impingement syndrome of shoulder region, left 11/29/2016   Anxiety    Allergic rhinitis    Vitamin D deficiency disease     Past Surgical History:  Procedure Laterality Date   ABDOMINAL HYSTERECTOMY  2005ish   fibroids, ovaries remain; along with bladder tack   BACK SURGERY     Lower back   bladder sling mesh removal  08/2018   bladder tack  2005ish   CHOLECYSTECTOMY     COLONOSCOPY WITH PROPOFOL N/A 06/22/2018   Procedure: COLONOSCOPY WITH PROPOFOL;  Surgeon: Virgel Manifold, MD;  Location: [facility];  Service: Endoscopy;   Laterality: N/A;   CYSTO WITH HYDRODISTENSION N/A 05/02/2018   Procedure: CYSTOSCOPY/HYDRODISTENSION;  Surgeon: Royston Cowper, MD;  Location: [facility] OR[facility];  Service: Urology;  Laterality: N/A;   ESOPHAGOGASTRODUODENOSCOPY (EGD) WITH PROPOFOL N/A 06/22/2018   Procedure: ESOPHAGOGASTRODUODENOSCOPY (EGD) WITH PROPOFOL;  Surgeon: Virgel Manifold, MD;  Location: [facility] END[facility];  Service: Endoscopy;  Laterality: N/A;   HEMI-MICRODISCECTOMY LUMBAR LAMINECTOMY LEVEL 1 Left 04/27/2021   Procedure: LEFT L4-5 HEMILAMINECTOMY;  Surgeon: Deetta Perla, MD;  Location: [facility] OR[facility];  Service: Neurosurgery;  Laterality: Left;   INCONTINENCE SURGERY     ROTATOR CUFF REPAIR Left 01/08/2019   rotator cuff revision Left 12/06/2019   SKIN CANCER EXCISION  06/2014   removed from back    Family History  Problem Relation Age of Onset   Hyperlipidemia Mother    Diabetes Sister    Hyperlipidemia Sister    Hypertension Sister    Hypertension Brother    Hypertension Son    Kidney Stones Son    Pneumonia Maternal Grandmother    Heart attack Maternal Grandfather    Hypertension Sister    Epilepsy Sister    Hypertension Sister    Hypertension Daughter    Kidney Stones Daughter    Cancer Neg Hx    COPD Neg Hx    Stroke Neg Hx     Social History   Tobacco Use   Smoking status: Never   Smokeless tobacco: Never  Substance Use Topics   Alcohol use: Yes    Comment: occ     Current Outpatient Medications:    acetaminophen (TYLENOL) 500 MG tablet, Take 1,000 mg by mouth every 8 (eight) hours as needed for moderate pain., Disp: , Rfl:    albuterol (PROVENTIL) (2.5 MG/3ML) 0.083% nebulizer solution, Take 3 mLs (2.5 mg total) by nebulization every 4 (four) hours as needed for wheezing or shortness of breath., Disp: 75 mL, Rfl: 1   albuterol (VENTOLIN HFA) 108 (90 Base) MCG/ACT inhaler, Inhale 2 puffs into the lungs 4 (four) times daily as needed for shortness of breath or wheezing., Disp: , Rfl:    blood  glucose meter kit and supplies, Dispense based on patient and insurance preference. Use up to four times daily as directed. (FOR ICD-10 E10.9, E11.9)., Disp: 1 each, Rfl: 0   Cholecalciferol (VITAMIN D) 2000 units tablet, Take 2,000 Units by mouth daily. , Disp: , Rfl:    DULoxetine (CYMBALTA) 30 MG capsule, Take 1 capsule (30 mg total) by mouth daily., Disp: 90 capsule, Rfl: 1   fluticasone (FLONASE) 50 MCG/ACT nasal spray, Place into both nostrils., Disp: , Rfl:    ibuprofen (ADVIL) 200 MG tablet, Take 400-600 mg by mouth every 8 (eight) hours as needed for moderate pain., Disp: , Rfl:    levocetirizine (XYZAL) 5 MG tablet, TAKE 1 TABLET BY MOUTH ONCE DAILY IN THE EVENING, Disp: 90 tablet, Rfl: 0  montelukast (SINGULAIR) 10 MG tablet, Take 1 tablet (10 mg total) by mouth at bedtime., Disp: 90 tablet, Rfl: 1   olmesartan (BENICAR) 40 MG tablet, Take 1 tablet by mouth once daily, Disp: 90 tablet, Rfl: 0   omeprazole (PRILOSEC) 20 MG capsule, Take 1 capsule by mouth once daily in the morning, Disp: 90 capsule, Rfl: 0   predniSONE (DELTASONE) 50 MG tablet, Pt to take 50 mg of prednisone on 7/24 at 8:40 pm, 50 mg of prednisone on 7/25 at 2:40 am, and 50 mg of prednisone on 7/25 at 8:40 am. Pt is also to take 50 mg of benadryl on 7/25 at 8:40 am. Please call [phone] with any questions., Disp: 3 tablet, Rfl: 0   temazepam (RESTORIL) 7.5 MG capsule, TAKE 1 TO 2 CAPSULES BY MOUTH AT BEDTIME AS NEEDED FOR SLEEP, Disp: 30 capsule, Rfl: 0  Allergies  Allergen Reactions   Betadine [Povidone Iodine] Swelling, Dermatitis and Rash    Pt states site gets infected   Metrizamide Swelling    Noted in [facility]. chart on 01/06/2012. Premedicate patient prior to contrast media injections.   Other     [place]: vomiting    Salicylic Acid-Sulfur     Burst eye vessels   Bee Pollen     Sneezing, watery eyes, coughing.    Meloxicam Itching   Pollen Extract Other (See Comments)    Sneezing, watery eyes,  coughing.    Sulindac Anxiety and Other (See Comments)   Compazine  [Prochlorperazine] Other (See Comments)    Jaws locked up   Contrast Media [Iodinated Contrast Media] Swelling    Noted in [place]. chart on 01/06/2012. Premedicate patient prior to contrast media injections.   Doxycycline Nausea Only    GI upset   Elemental Sulfur Other (See Comments)    Made all blood vessels in eyes burst   Gabapentin Nausea And Vomiting    Lip numbness    Keppra [Levetiracetam]     Pt was not herself    Lyrica [Pregabalin]     Tingling, depression, blister mouth    Rosuvastatin     Mental fogginess, confusion, fatigue    Codeine Nausea And Vomiting   Hydroxychloroquine Nausea And Vomiting and Nausea Only    Ear clicking   Iodine Swelling   Sulfa Antibiotics Nausea And Vomiting and Other (See Comments)    I personally reviewed active problem list, medication list, allergies, family history, social history, health maintenance with the patient/caregiver today.   ROS  ***  Objective  There were no vitals filed for this visit.  There is no height or weight on file to calculate BMI.  Physical Exam ***  No results found for this or any previous visit (from the past 2160 hour(s)).  Diabetic Foot Exam: Diabetic Foot Exam - Simple   No data filed    ***  PHQ2/9:    05/10/2022    3:14 PM 04/08/2022   10:30 AM 03/19/2022    8:11 AM 03/10/2022    9:57 AM 09/08/2021   11:03 AM  Depression screen PHQ 2/9  Decreased Interest 1 1 1 1  0  Down, Depressed, Hopeless 2 1 1 2  0  PHQ - 2 Score 3 2 2 3  0  Altered sleeping 2 3 3 3  0  Tired, decreased energy 2 3 3 1  0  Change in appetite 0 2 3 1  0  Feeling bad or failure about yourself  0 2 2 3  0  Trouble concentrating 3  3 1 2  0  Moving slowly or fidgety/restless 0 0 0 0 0  Suicidal thoughts 0 0 0 1 0  PHQ-9 Score 10 15 14 14  0  Difficult doing work/chores Somewhat difficult   Very difficult     phq 9 is {gen pos [id number]}   Fall  Risk:    05/10/2022    3:07 PM 04/08/2022   10:30 AM 03/19/2022    8:11 AM 03/10/2022    9:56 AM 09/08/2021   10:54 AM  Fall Risk   Falls in the past year? 0 0 0 0 0  Number falls in past yr: 0 0 0  0  Injury with Fall? 0 0 0  0  Risk for fall due to : No Fall Risks No Fall Risks No Fall Risks No Fall Risks No Fall Risks  Follow up Falls prevention discussed;Education provided Falls prevention discussed Falls prevention discussed Falls prevention discussed Falls prevention discussed      Functional Status Survey:      Assessment & Plan  *** There are no diagnoses linked to this encounter.

## 2022-08-24 ENCOUNTER — Ambulatory Visit: Payer: BC Managed Care – PPO | Admitting: Family Medicine

## 2022-08-24 ENCOUNTER — Encounter: Payer: Self-pay | Admitting: Family Medicine

## 2022-08-24 VITALS — BP 122/78 | HR 81 | Temp 98.0°F | Resp 16 | Ht 63.0 in | Wt 178.1 lb

## 2022-08-24 DIAGNOSIS — G8929 Other chronic pain: Secondary | ICD-10-CM

## 2022-08-24 DIAGNOSIS — M797 Fibromyalgia: Secondary | ICD-10-CM

## 2022-08-24 DIAGNOSIS — Z1211 Encounter for screening for malignant neoplasm of colon: Secondary | ICD-10-CM

## 2022-08-24 DIAGNOSIS — J3089 Other allergic rhinitis: Secondary | ICD-10-CM | POA: Diagnosis not present

## 2022-08-24 DIAGNOSIS — M159 Polyosteoarthritis, unspecified: Secondary | ICD-10-CM

## 2022-08-24 DIAGNOSIS — E1169 Type 2 diabetes mellitus with other specified complication: Secondary | ICD-10-CM | POA: Diagnosis not present

## 2022-08-24 DIAGNOSIS — M79645 Pain in left finger(s): Secondary | ICD-10-CM

## 2022-08-24 DIAGNOSIS — J4520 Mild intermittent asthma, uncomplicated: Secondary | ICD-10-CM

## 2022-08-24 DIAGNOSIS — J302 Other seasonal allergic rhinitis: Secondary | ICD-10-CM

## 2022-08-24 DIAGNOSIS — F5105 Insomnia due to other mental disorder: Secondary | ICD-10-CM

## 2022-08-24 DIAGNOSIS — F339 Major depressive disorder, recurrent, unspecified: Secondary | ICD-10-CM

## 2022-08-24 DIAGNOSIS — E785 Hyperlipidemia, unspecified: Secondary | ICD-10-CM

## 2022-08-24 DIAGNOSIS — M79644 Pain in right finger(s): Secondary | ICD-10-CM

## 2022-08-24 DIAGNOSIS — I10 Essential (primary) hypertension: Secondary | ICD-10-CM

## 2022-08-24 DIAGNOSIS — K219 Gastro-esophageal reflux disease without esophagitis: Secondary | ICD-10-CM

## 2022-08-24 LAB — POCT GLYCOSYLATED HEMOGLOBIN (HGB A1C): Hemoglobin A1C: 5.3 % (ref 4.0–5.6)

## 2022-08-24 MED ORDER — EZETIMIBE 10 MG PO TABS: 10.0000 mg | ORAL_TABLET | Freq: Every day | ORAL | 3 refills | Status: DC

## 2022-08-24 MED ORDER — LEVOCETIRIZINE DIHYDROCHLORIDE 5 MG PO TABS: 5.0000 mg | ORAL_TABLET | Freq: Every evening | ORAL | 0 refills | Status: DC

## 2022-08-24 MED ORDER — DULOXETINE HCL 30 MG PO CPEP: 30.0000 mg | ORAL_CAPSULE | Freq: Every day | ORAL | 0 refills | Status: DC

## 2022-08-24 MED ORDER — BUPROPION HCL ER (XL) 150 MG PO TB24: 150.0000 mg | ORAL_TABLET | Freq: Every day | ORAL | 0 refills | Status: DC

## 2022-08-24 MED ORDER — TEMAZEPAM 7.5 MG PO CAPS: 7.5000 mg | ORAL_CAPSULE | Freq: Every day | ORAL | 0 refills | Status: DC

## 2022-08-24 MED ORDER — OLMESARTAN MEDOXOMIL 40 MG PO TABS
40.0000 mg | ORAL_TABLET | Freq: Every day | ORAL | 0 refills | Status: DC
Start: 2022-08-24 — End: 2022-12-27

## 2022-08-24 MED ORDER — OMEPRAZOLE 20 MG PO CPDR: 20.0000 mg | DELAYED_RELEASE_CAPSULE | Freq: Every morning | ORAL | 0 refills | Status: DC

## 2022-08-24 MED ORDER — MONTELUKAST SODIUM 10 MG PO TABS: 10.0000 mg | ORAL_TABLET | Freq: Every day | ORAL | 0 refills | Status: DC

## 2022-08-25 LAB — MICROALBUMIN / CREATININE URINE RATIO
Creatinine, Urine: 112 mg/dL (ref 20–275)
Microalb Creat Ratio: 4 mcg/mg creat (ref <30)
Microalb, Ur: 0.5 mg/dL

## 2022-09-09 ENCOUNTER — Ambulatory Visit: Payer: BC Managed Care – PPO | Admitting: Dermatology

## 2022-09-09 DIAGNOSIS — L719 Rosacea, unspecified: Secondary | ICD-10-CM | POA: Diagnosis not present

## 2022-09-09 DIAGNOSIS — L821 Other seborrheic keratosis: Secondary | ICD-10-CM

## 2022-09-09 DIAGNOSIS — R21 Rash and other nonspecific skin eruption: Secondary | ICD-10-CM

## 2022-09-09 MED ORDER — HYDROCORTISONE 2.5 % EX CREA: TOPICAL_CREAM | CUTANEOUS | 1 refills | Status: AC

## 2022-09-09 MED ORDER — RHOFADE 1 % EX CREA: TOPICAL_CREAM | CUTANEOUS | 2 refills | Status: DC

## 2022-09-09 NOTE — Patient Instructions (Addendum)
Rosacea is a chronic progressive skin condition usually affecting the face of adults, causing redness and/or acne bumps. It is treatable but not curable. It sometimes affects the eyes (ocular rosacea) as well. It may respond to topical and/or systemic medication and can flare with stress, sun exposure, alcohol, exercise, topical steroids (including hydrocortisone/cortisone 10) and some foods.  Daily application of broad spectrum spf 30+ sunscreen to face is recommended to reduce flares.  Recommend Free and Clear laundry soap, Vanicream products.  Due to recent changes in healthcare laws, you may see results of your pathology and/or laboratory studies on MyChart before the doctors have had a chance to review them. We understand that in some cases there may be results that are confusing or concerning to you. Please understand that not all results are received at the same time and often the doctors may need to interpret multiple results in order to provide you with the best plan of care or course of treatment. Therefore, we ask that you please give [place] 2 business days to thoroughly review all your results before contacting the office for clarification. Should we see a critical lab result, you will be contacted sooner.   If You Need Anything After Your Visit  If you have any questions or concerns for your doctor, please call our main line at [phone] and press option 4 to reach your doctor's medical assistant. If no one answers, please leave a voicemail as directed and we will return your call as soon as possible. Messages left after 4 pm will be answered the following business day.   You may also send [place] a message via [place]. We typically respond to MyChart messages within 1-2 business days.  For prescription refills, please ask your pharmacy to contact our office. Our fax number is [phone].  If you have an urgent issue when the clinic is closed that cannot wait until the next business day, you  can page your doctor at the number below.    Please note that while we do our best to be available for urgent issues outside of office hours, we are not available 24/7.   If you have an urgent issue and are unable to reach [place], you may choose to seek medical care at your doctor's office, retail clinic, urgent care center, or emergency room.  If you have a medical emergency, please immediately call 911 or go to the emergency department.  Pager Numbers  - Dr. Nehemiah Massed: [phone]  - Dr. Laurence Ferrari: [phone]  - Dr. Nicole Kindred: [phone]  In the event of inclement weather, please call our main line at [phone] for an update on the status of any delays or closures.  Dermatology Medication Tips: Please keep the boxes that topical medications come in in order to help keep track of the instructions about where and how to use these. Pharmacies typically print the medication instructions only on the boxes and not directly on the medication tubes.   If your medication is too expensive, please contact our office at [phone] option 4 or send [place] a message through [place].   We are unable to tell what your co-pay for medications will be in advance as this is different depending on your insurance coverage. However, we may be able to find a substitute medication at lower cost or fill out paperwork to get insurance to cover a needed medication.   If a prior authorization is required to get your medication covered by your insurance company, please allow [place] 1-2 business days  to complete this process.  Drug prices often vary depending on where the prescription is filled and some pharmacies may offer cheaper prices.  The website www.goodrx.com contains coupons for medications through different pharmacies. The prices here do not account for what the cost may be with help from insurance (it may be cheaper with your insurance), but the website can give you the price if you did not use any insurance.  -  You can print the associated coupon and take it with your prescription to the pharmacy.  - You may also stop by our office during regular business hours and pick up a GoodRx coupon card.  - If you need your prescription sent electronically to a different pharmacy, notify our office through [facility] or by phone at [phone] option 4.     Si Usted Necesita Algo Despus de Su Visita  Tambin puede enviarnos un mensaje a travs de Pharmacist, community. Por lo general respondemos a los mensajes de MyChart en el transcurso de 1 a 2 das hbiles.  Para renovar recetas, por favor pida a su farmacia que se ponga en contacto con nuestra oficina. Harland Dingwall de fax es [place] [phone].  Si tiene un asunto urgente cuando la clnica est cerrada y que no puede esperar hasta el siguiente da hbil, puede llamar/localizar a su doctor(a) al nmero que aparece a continuacin.   Por favor, tenga en cuenta que aunque hacemos todo lo posible para estar disponibles para asuntos urgentes fuera del horario de [place], no estamos disponibles las 24 horas del da, los 7 das de la [place].   Si tiene un problema urgente y no puede comunicarse con nosotros, puede optar por buscar atencin mdica  en el consultorio de su doctor(a), en una clnica privada, en un centro de atencin urgente o en una sala de emergencias.  Si tiene Engineering geologist, por favor llame inmediatamente al 911 o vaya a la sala de emergencias.  Nmeros de bper  - Dr. Nehemiah Massed: [phone]  - Dra. Moye: [phone]  - Dra. Nicole Kindred: [phone]  En caso de inclemencias del [place], por favor llame a Johnsie Kindred principal al [phone] para una actualizacin sobre el [place] de cualquier retraso o cierre.  Consejos para la medicacin en dermatologa: Por favor, guarde las cajas en las que vienen los medicamentos de uso tpico para ayudarle a seguir las instrucciones sobre dnde y cmo usarlos. Las farmacias generalmente imprimen  las instrucciones del medicamento slo en las cajas y no directamente en los tubos del [place].   Si su medicamento es muy caro, por favor, pngase en contacto con Zigmund Daniel llamando al [phone] y presione la opcin 4 o envenos un mensaje a travs de Pharmacist, community.   No podemos decirle cul ser su copago por los medicamentos por adelantado ya que esto es diferente dependiendo de la cobertura de su seguro. Sin embargo, es posible que podamos encontrar un medicamento sustituto a Electrical engineer un formulario para que el seguro cubra el medicamento que se considera necesario.   Si se requiere una autorizacin previa para que su compaa de seguros [place] su medicamento, por favor permtanos de 1 a 2 das hbiles para completar este proceso.  Los precios de los medicamentos varan con frecuencia dependiendo del Environmental consultant de dnde se surte la receta y alguna farmacias pueden ofrecer precios ms baratos.  El sitio web www.goodrx.com tiene cupones para medicamentos de Airline pilot. Los precios aqu no tienen en cuenta lo que podra costar con la [employer]  del seguro (puede ser ms barato con su seguro), pero el sitio web puede darle el precio si no Field seismologist.  - Puede imprimir el cupn correspondiente y llevarlo con su receta a la farmacia.  - Tambin puede pasar por nuestra oficina durante el horario de atencin regular y Charity fundraiser una tarjeta de cupones de GoodRx.  - Si necesita que su receta se enve electrnicamente a una farmacia diferente, informe a nuestra oficina a travs de MyChart de [place] o por telfono llamando al [phone] y presione la opcin 4.

## 2022-09-09 NOTE — Progress Notes (Signed)
    Follow-Up Visit   Subjective  Yolanda Pace is a 56 y.o. female who presents for the following: Rash (Patient advises the rash at right side of face has been there for about a month. Sometimes itches. She has tried some lotion on it and has taken Benadryl with no improvement. Last weekend she noticed some redness and burning at face. ).  Patient normally uses [employer] liquid laundry soap but has switched to pods.   The following portions of the chart were reviewed this encounter and updated as appropriate:   Tobacco  Allergies  Meds  Problems  Med Hx  Surg Hx  Fam Hx      Review of Systems:  No other skin or systemic complaints except as noted in HPI or Assessment and Plan.  Objective  Well appearing patient in no apparent distress; mood and affect are within normal limits.  A focused examination was performed including face, neck. Relevant physical exam findings are noted in the Assessment and Plan.  face Scaly pink papules coalescing to plaques at right cheek, left upper and lower eyelid and neck  face Mid face erythema     Assessment & Plan  Rash face  Ddx contact dermatitis, atopic dermatitis  Start HC 2.5% cream to affected areas of rash 1-2 times daily for up to 1 week.  If rash is recurrent, consider patch testing.  Recommend Free and Clear laundry soap, Vanicream products.  hydrocortisone 2.5 % cream - face Apply 1-2 times daily for up to 1 week to affected areas of rash.  Rosacea face  Chronic and persistent condition with duration or expected duration over one year. Condition is bothersome/symptomatic for patient. Currently flared.  Rosacea is a chronic progressive skin condition usually affecting the face of adults, causing redness and/or acne bumps. It is treatable but not curable. It sometimes affects the eyes (ocular rosacea) as well. It may respond to topical and/or systemic medication and can flare with stress, sun exposure, alcohol,  exercise, topical steroids (including hydrocortisone/cortisone 10) and some foods.  Daily application of broad spectrum spf 30+ sunscreen to face is recommended to reduce flares.  Discussed the treatment option of BBL/laser.  Typically we recommend 1-3 treatment sessions about 5-8 weeks apart for best results.  The patient's condition may require "maintenance treatments" in the future.  The fee for BBL / laser treatments is $350 per treatment session for the whole face.  A fee can be quoted for other parts of the body. Insurance typically does not pay for BBL/laser treatments and therefore the fee is an out-of-pocket cost.  Start Rhofade once daily in the morning.  If not covered consider [employer] oxymetazoline/ivermectin cream   Oxymetazoline HCl (RHOFADE) 1 % CREA - face Apply once daily in the morning.  Seborrheic Keratoses - Stuck-on, waxy, tan-brown papules and/or plaques  - Benign-appearing - Discussed benign etiology and prognosis. - Observe - Call for any changes  Return for as scheduled, TBSE.  Graciella Belton, RMA, am acting as scribe for Forest Gleason, MD .  Documentation: I have reviewed the above documentation for accuracy and completeness, and I agree with the above.  Forest Gleason, MD

## 2022-09-13 ENCOUNTER — Encounter (INDEPENDENT_AMBULATORY_CARE_PROVIDER_SITE_OTHER): Payer: Self-pay

## 2022-09-13 ENCOUNTER — Encounter: Payer: Self-pay | Admitting: Dermatology

## 2022-09-13 LAB — HM DIABETES EYE EXAM

## 2022-12-03 ENCOUNTER — Ambulatory Visit
Admission: RE | Admit: 2022-12-03 | Discharge: 2022-12-03 | Disposition: A | Discharge status: Home / Self Care | Payer: BC Managed Care – PPO | Source: Ambulatory Visit | Attending: Family Medicine | Admitting: Family Medicine

## 2022-12-03 ENCOUNTER — Other Ambulatory Visit: Payer: Self-pay | Admitting: Family Medicine

## 2022-12-03 DIAGNOSIS — N6489 Other specified disorders of breast: Secondary | ICD-10-CM

## 2022-12-16 ENCOUNTER — Other Ambulatory Visit: Payer: Self-pay | Admitting: Family Medicine

## 2022-12-16 DIAGNOSIS — F339 Major depressive disorder, recurrent, unspecified: Secondary | ICD-10-CM

## 2022-12-16 DIAGNOSIS — M797 Fibromyalgia: Secondary | ICD-10-CM

## 2022-12-24 NOTE — Progress Notes (Unsigned)
Name: Yolanda Pace   MRN: [id number]    DOB: Dec 19, 1965   Date:12/24/2022       Progress Note  Subjective  Chief Complaint  Follow Up  HPI  Depression: we adjusted Duloxetine from 20 mg to 30 mg, and she is was doing better, OCD symptoms had improved and mood was better, she is still depressed but afraid to  to change medications or increase dose of Duloxetine because she is afraid of withdrawals.Discussed adding wellbutrin since she does not need to wean self off and may help with energy and she is willing to try it. She is sleeping better with Temazepam and we will send a refill today Hydroxizine made her feel groggy, trazodone did not work in the past   Dizziness and abnormal Brain MRI: seen by ENT and is going to see neurologist tomorrow.   IMPRESSION: 1. No internal auditory canal or cerebellopontine angle mass lesion or focus of abnormal contrast enhancement. 2. 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 Mass effect is noted on the anterior left temporal lobe. 3. Minimal amount of nonspecific T2 hyperintense lesions of the white matter, may represent early chronic microangiopathy  FMS/elevated CRP/Connective Tissue Disease : she was seen by Dr. Jefferson Fuel in the past She states x-rays were done and also labs , inflammatory markers improved and she was given reassurance that is it OA and to continue to monitor.  Takes Duloxetine  49m , she takes flexeril prn for sleep. 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 Discussed importance of statin therapy but it caused her muscle pain in the past we will try adding zetia today   GERD:  controlled with medication, denies heartburn or indigestion . Taking Omeprazole and symptoms are stable   Mild intermittent asthma: seeing Dr. KHumphrey Rollsand is off steroid inhaler, uses rescue and also on singulair . She is not on maintenance steroid at this time.    Chronic back pain with radiculitis: had lumbar laminectomy done by Dr. CLacinda AxonJune 2022 currently taking low dose duloxetine and flexeril prn . No bowel or bladder incontinence. She sees Dr. 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 [place] 02/23/2021   Fibromyalgia 02/20/2021   Connective tissue disease ([place] 10/31/2020   Chronic shoulder pain 07/07/2020   Mild intermittent asthma without complication [id number]  Traumatic incomplete tear of left rotator cuff 04/23/2020   Arthralgia 08/03/2019   Gastric polyp    Benign neoplasm of transverse colon    Internal hemorrhoids    Malignant neoplasm of skin 06/19/2018   Chronic pain syndrome [id number]  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   Kidney stone 02/20/2018   Renal lesion 02/20/2018   Benign hypertension 01/21/2018   Pelvic pain 12/26/2017   GERD (gastroesophageal reflux disease) 11/09/2017   Elevated beta-2 microglobulin 07/11/2017   Right thyroid  nodule 05/26/2017   Mild hypercholesterolemia 05/06/2017   Generalized osteoarthritis of hand 12/13/2016   Impingement syndrome of shoulder region, left 11/29/2016   Anxiety    Allergic rhinitis    Vitamin D deficiency disease     Past Surgical History:  Procedure Laterality Date   ABDOMINAL HYSTERECTOMY  2005ish   fibroids, ovaries remain; along with bladder tack   BACK SURGERY     Lower back   bladder sling mesh removal  08/2018   bladder tack  2005ish    CHOLECYSTECTOMY     COLONOSCOPY WITH PROPOFOL N/A 06/22/2018   Procedure: COLONOSCOPY WITH PROPOFOL;  Surgeon: Virgel Manifold, MD;  Location: [facility];  Service: Endoscopy;  Laterality: N/A;   CYSTO WITH HYDRODISTENSION N/A 05/02/2018   Procedure: CYSTOSCOPY/HYDRODISTENSION;  Surgeon: Royston Cowper, MD;  Location: [facility] OR[facility];  Service: Urology;  Laterality: N/A;   ESOPHAGOGASTRODUODENOSCOPY (EGD) WITH PROPOFOL N/A 06/22/2018   Procedure: ESOPHAGOGASTRODUODENOSCOPY (EGD) WITH PROPOFOL;  Surgeon: Virgel Manifold, MD;  Location: [facility];  Service: Endoscopy;  Laterality: N/A;   HEMI-MICRODISCECTOMY LUMBAR LAMINECTOMY LEVEL 1 Left 04/27/2021   Procedure: LEFT L4-5 HEMILAMINECTOMY;  Surgeon: Deetta Perla, MD;  Location: [facility] OR[facility];  Service: Neurosurgery;  Laterality: Left;   INCONTINENCE SURGERY     ROTATOR CUFF REPAIR Left 01/08/2019   rotator cuff revision Left 12/06/2019   SKIN CANCER EXCISION  06/2014   removed from back    Family History  Problem Relation Age of Onset   Hyperlipidemia Mother    Diabetes Sister    Hyperlipidemia Sister    Hypertension Sister    Hypertension Brother    Hypertension Son    Kidney Stones Son    Pneumonia Maternal Grandmother    Heart attack Maternal Grandfather    Hypertension Sister    Epilepsy Sister    Hypertension Sister    Hypertension Daughter    Kidney Stones Daughter    Cancer Neg Hx    COPD Neg Hx    Stroke Neg Hx     Social History   Tobacco Use   Smoking status: Never   Smokeless tobacco: Never  Substance Use Topics   Alcohol use: Yes    Comment: occ     Current Outpatient Medications:    acetaminophen (TYLENOL) 500 MG tablet, Take 1,000 mg by mouth every 8 (eight) hours as needed for moderate pain., Disp: , Rfl:    albuterol (PROVENTIL) (2.5 MG/3ML) 0.083% nebulizer solution, Take 3 mLs (2.5 mg total) by nebulization every 4 (four) hours as needed for wheezing or shortness of breath., Disp: 75 mL, Rfl:  1   albuterol (VENTOLIN HFA) 108 (90 Base) MCG/ACT inhaler, Inhale 2 puffs into the lungs 4 (four) times daily as needed for shortness of breath or wheezing., Disp: , Rfl:    blood glucose meter kit and supplies, Dispense based on patient and insurance preference. Use up to four times daily as directed. (FOR ICD-10 E10.9, E11.9)., Disp: 1 each, Rfl: 0   buPROPion (WELLBUTRIN XL) 150 MG 24 hr tablet, Take 1 tablet (150 mg total) by mouth daily., Disp: 90 tablet, Rfl: 0   Cholecalciferol (VITAMIN D) 2000 units tablet, Take 2,000 Units by mouth daily. , Disp: , Rfl:    DULoxetine (CYMBALTA) 30 MG capsule, Take 1 capsule by mouth once daily, Disp: 90 capsule, Rfl: 0   ezetimibe (ZETIA) 10 MG tablet, Take 1 tablet (10 mg total) by mouth daily., Disp: 90 tablet, Rfl: 3   fluticasone (FLONASE) 50  MCG/ACT nasal spray, Place into both nostrils. (Patient not taking: Reported on 08/24/2022), Disp: , Rfl:    hydrocortisone 2.5 % cream, Apply 1-2 times daily for up to 1 week to affected areas of rash., Disp: 30 g, Rfl: 1   ibuprofen (ADVIL) 200 MG tablet, Take 400-600 mg by mouth every 8 (eight) hours as needed for moderate pain., Disp: , Rfl:    levocetirizine (XYZAL) 5 MG tablet, Take 1 tablet (5 mg total) by mouth every evening., Disp: 90 tablet, Rfl: 0   montelukast (SINGULAIR) 10 MG tablet, Take 1 tablet (10 mg total) by mouth at bedtime., Disp: 90 tablet, Rfl: 0   olmesartan (BENICAR) 40 MG tablet, Take 1 tablet (40 mg total) by mouth daily., Disp: 90 tablet, Rfl: 0   omeprazole (PRILOSEC) 20 MG capsule, Take 1 capsule (20 mg total) by mouth every morning., Disp: 90 capsule, Rfl: 0   Oxymetazoline HCl (RHOFADE) 1 % CREA, Apply once daily in the morning., Disp: 30 g, Rfl: 2   temazepam (RESTORIL) 7.5 MG capsule, Take 1 capsule (7.5 mg total) by mouth at bedtime., Disp: 90 capsule, Rfl: 0  Allergies  Allergen Reactions   Betadine [Povidone Iodine] Swelling, Dermatitis and Rash    Pt states site gets  infected   Metrizamide Swelling    Noted in [facility] chart on 01/06/2012. Premedicate patient prior to contrast media injections.   Other     [place]: vomiting    Salicylic Acid-Sulfur     Burst eye vessels   Bee Pollen     Sneezing, watery eyes, coughing.    Meloxicam Itching   Pollen Extract Other (See Comments)    Sneezing, watery eyes, coughing.    Sulindac Anxiety and Other (See Comments)   Compazine  [Prochlorperazine] Other (See Comments)    Jaws locked up   Contrast Media [Iodinated Contrast Media] Swelling    Noted in [facility]. chart on 01/06/2012. Premedicate patient prior to contrast media injections.   Doxycycline Nausea Only    GI upset   Elemental Sulfur Other (See Comments)    Made all blood vessels in eyes burst   Gabapentin Nausea And Vomiting    Lip numbness    Keppra [Levetiracetam]     Pt was not herself    Lyrica [Pregabalin]     Tingling, depression, blister mouth    Rosuvastatin     Mental fogginess, confusion, fatigue    Codeine Nausea And Vomiting   Hydroxychloroquine Nausea And Vomiting and Nausea Only    Ear clicking   Iodine Swelling   Sulfa Antibiotics Nausea And Vomiting and Other (See Comments)    I personally reviewed active problem list, medication list, allergies, family history, social history, health maintenance with the patient/caregiver today.   ROS  ***  Objective  There were no vitals filed for this visit.  There is no height or weight on file to calculate BMI.  Physical Exam ***  No results found for this or any previous visit (from the past 2160 hour(s)).   PHQ2/9:    08/24/2022    8:48 AM 05/10/2022    3:14 PM 04/08/2022   10:30 AM 03/19/2022    8:11 AM 03/10/2022    9:57 AM  Depression screen PHQ 2/9  Decreased Interest 2 1 1 1 1  $ Down, Depressed, Hopeless 2 2 1 1 2  $ PHQ - 2 Score 4 3 2 2 3  $ Altered sleeping 1 2 3 3 3  $ Tired, decreased energy 1  $2 3 3 1  W$ Change in appetite 3 0 2 3 1  $ Feeling bad or failure  about yourself  1 0 2 2 3  $ Trouble concentrating 3 3 3 1 2  $ Moving slowly or fidgety/restless 0 0 0 0 0  Suicidal thoughts 2 0 0 0 1  PHQ-9 Score 15 10 15 14 14  $ Difficult doing work/chores Very difficult Somewhat difficult   Very difficult    phq 9 is {gen pos [id number]   Fall Risk:    08/24/2022    8:44 AM 05/10/2022    3:07 PM 04/08/2022   10:30 AM 03/19/2022    8:11 AM 03/10/2022    9:56 AM  [place] in the past year? 0 0 0 0 0  Number falls in past yr:  0 0 0   Injury with Fall?  0 0 0   Risk for fall due to : No Fall Risks No Fall Risks No Fall Risks No Fall Risks No Fall Risks  Follow up Falls prevention discussed Falls prevention discussed;Education provided Falls prevention discussed Falls prevention discussed Falls prevention discussed      Functional Status Survey:      Assessment & Plan  *** There are no diagnoses linked to this encounter.

## 2022-12-27 ENCOUNTER — Encounter: Payer: Self-pay | Admitting: Family Medicine

## 2022-12-27 ENCOUNTER — Ambulatory Visit: Payer: BC Managed Care – PPO | Admitting: Family Medicine

## 2022-12-27 VITALS — BP 130/78 | HR 81 | Resp 16 | Ht 63.0 in | Wt 184.0 lb

## 2022-12-27 DIAGNOSIS — J4520 Mild intermittent asthma, uncomplicated: Secondary | ICD-10-CM

## 2022-12-27 DIAGNOSIS — J3089 Other allergic rhinitis: Secondary | ICD-10-CM

## 2022-12-27 DIAGNOSIS — F339 Major depressive disorder, recurrent, unspecified: Secondary | ICD-10-CM

## 2022-12-27 DIAGNOSIS — K219 Gastro-esophageal reflux disease without esophagitis: Secondary | ICD-10-CM

## 2022-12-27 DIAGNOSIS — J302 Other seasonal allergic rhinitis: Secondary | ICD-10-CM

## 2022-12-27 DIAGNOSIS — E785 Hyperlipidemia, unspecified: Secondary | ICD-10-CM | POA: Diagnosis not present

## 2022-12-27 DIAGNOSIS — F5105 Insomnia due to other mental disorder: Secondary | ICD-10-CM

## 2022-12-27 DIAGNOSIS — M159 Polyosteoarthritis, unspecified: Secondary | ICD-10-CM

## 2022-12-27 DIAGNOSIS — I10 Essential (primary) hypertension: Secondary | ICD-10-CM

## 2022-12-27 DIAGNOSIS — M797 Fibromyalgia: Secondary | ICD-10-CM

## 2022-12-27 DIAGNOSIS — M150 Primary generalized (osteo)arthritis: Secondary | ICD-10-CM

## 2022-12-27 DIAGNOSIS — E1169 Type 2 diabetes mellitus with other specified complication: Secondary | ICD-10-CM | POA: Diagnosis not present

## 2022-12-27 LAB — POCT GLYCOSYLATED HEMOGLOBIN (HGB A1C): Hemoglobin A1C: 6.3 % — AB (ref 4.0–5.6)

## 2022-12-27 MED ORDER — BUDESONIDE-FORMOTEROL FUMARATE 160-4.5 MCG/ACT IN AERO: 2.0000 | INHALATION_SPRAY | Freq: Two times a day (BID) | RESPIRATORY_TRACT | 3 refills | Status: DC

## 2022-12-27 MED ORDER — OLMESARTAN MEDOXOMIL 40 MG PO TABS: 40.0000 mg | ORAL_TABLET | Freq: Every day | ORAL | 1 refills | Status: DC

## 2022-12-27 MED ORDER — MONTELUKAST SODIUM 10 MG PO TABS: 10.0000 mg | ORAL_TABLET | Freq: Every day | ORAL | 1 refills | Status: DC

## 2022-12-27 MED ORDER — LEVOCETIRIZINE DIHYDROCHLORIDE 5 MG PO TABS: 5.0000 mg | ORAL_TABLET | Freq: Every evening | ORAL | 1 refills | Status: DC

## 2022-12-27 MED ORDER — DULOXETINE HCL 30 MG PO CPEP: 30.0000 mg | ORAL_CAPSULE | Freq: Every day | ORAL | 0 refills | Status: DC

## 2022-12-27 MED ORDER — OMEPRAZOLE 20 MG PO CPDR: 20.0000 mg | DELAYED_RELEASE_CAPSULE | Freq: Every morning | ORAL | 1 refills | Status: DC

## 2022-12-27 NOTE — Patient Instructions (Signed)
Sharyn Lull at [place] inside of [address]  Insight website to find the profile of therapist

## 2023-01-03 ENCOUNTER — Ambulatory Visit: Payer: BC Managed Care – PPO | Admitting: Dermatology

## 2023-01-03 VITALS — BP 148/86

## 2023-01-03 DIAGNOSIS — L309 Dermatitis, unspecified: Secondary | ICD-10-CM

## 2023-01-03 MED ORDER — ZORYVE 0.3 % EX CREA: TOPICAL_CREAM | CUTANEOUS | 1 refills | Status: AC

## 2023-01-03 NOTE — Patient Instructions (Addendum)
Start Zoryve Cream - Apply to affected areas face once daily until improved.  Your prescription was sent to [facility] in [place]. A representative from [facility] will contact you within 3 business hours to verify your address and insurance information to schedule a free delivery. If for any reason you do not receive a phone call from them, please reach out to them. Their phone number is [phone] and their hours are Monday-Friday 9:00 am-5:00 pm.    Due to recent changes in healthcare laws, you may see results of your pathology and/or laboratory studies on MyChart before the doctors have had a chance to review them. We understand that in some cases there may be results that are confusing or concerning to you. Please understand that not all results are received at the same time and often the doctors may need to interpret multiple results in order to provide you with the best plan of care or course of treatment. Therefore, we ask that you please give [place] 2 business days to thoroughly review all your results before contacting the office for clarification. Should we see a critical lab result, you will be contacted sooner.   If You Need Anything After Your Visit  If you have any questions or concerns for your doctor, please call our main line at [phone] and press option 4 to reach your doctor's medical assistant. If no one answers, please leave a voicemail as directed and we will return your call as soon as possible. Messages left after 4 pm will be answered the following business day.   You may also send [place] a message via [place]. We typically respond to MyChart messages within 1-2 business days.  For prescription refills, please ask your pharmacy to contact our office. Our fax number is [phone].  If you have an urgent issue when the clinic is closed that cannot wait until the next business day, you can page your doctor at the number below.    Please note that while we do our  best to be available for urgent issues outside of office hours, we are not available 24/7.   If you have an urgent issue and are unable to reach [place], you may choose to seek medical care at your doctor's office, retail clinic, urgent care center, or emergency room.  If you have a medical emergency, please immediately call 911 or go to the emergency department.  Pager Numbers  - Dr. Nehemiah Massed: [phone]  - Dr. Laurence Ferrari: [phone]  - Dr. Nicole Kindred: [phone]  In the event of inclement weather, please call our main line at [phone] for an update on the status of any delays or closures.  Dermatology Medication Tips: Please keep the boxes that topical medications come in in order to help keep track of the instructions about where and how to use these. Pharmacies typically print the medication instructions only on the boxes and not directly on the medication tubes.   If your medication is too expensive, please contact our office at [phone] option 4 or send [place] a message through [place].   We are unable to tell what your co-pay for medications will be in advance as this is different depending on your insurance coverage. However, we may be able to find a substitute medication at lower cost or fill out paperwork to get insurance to cover a needed medication.   If a prior authorization is required to get your medication covered by your insurance company, please allow [place] 1-2 business days to complete this  process.  Drug prices often vary depending on where the prescription is filled and some pharmacies may offer cheaper prices.  The website www.goodrx.com contains coupons for medications through different pharmacies. The prices here do not account for what the cost may be with help from insurance (it may be cheaper with your insurance), but the website can give you the price if you did not use any insurance.  - You can print the associated coupon and take it with your prescription to the  pharmacy.  - You may also stop by our office during regular business hours and pick up a GoodRx coupon card.  - If you need your prescription sent electronically to a different pharmacy, notify our office through [facility] or by phone at [phone] option 4.     Si Usted Necesita Algo Despus de Su Visita  Tambin puede enviarnos un mensaje a travs de Pharmacist, community. Por lo general respondemos a los mensajes de MyChart en el transcurso de 1 a 2 das hbiles.  Para renovar recetas, por favor pida a su farmacia que se ponga en contacto con nuestra oficina. Harland Dingwall de fax es [place] [phone].  Si tiene un asunto urgente cuando la clnica est cerrada y que no puede esperar hasta el siguiente da hbil, puede llamar/localizar a su doctor(a) al nmero que aparece a continuacin.   Por favor, tenga en cuenta que aunque hacemos todo lo posible para estar disponibles para asuntos urgentes fuera del horario de [place], no estamos disponibles las 24 horas del da, los 7 das de la [place].   Si tiene un problema urgente y no puede comunicarse con nosotros, puede optar por buscar atencin mdica  en el consultorio de su doctor(a), en una clnica privada, en un centro de atencin urgente o en una sala de emergencias.  Si tiene Engineering geologist, por favor llame inmediatamente al 911 o vaya a la sala de emergencias.  Nmeros de bper  - Dr. Nehemiah Massed: [phone]  - Dra. Moye: [phone]  - Dra. Nicole Kindred: [phone]  En caso de inclemencias del Center, por favor llame a Johnsie Kindred principal al [phone] para una actualizacin sobre el [place] de cualquier retraso o cierre.  Consejos para la medicacin en dermatologa: Por favor, guarde las cajas en las que vienen los medicamentos de uso tpico para ayudarle a seguir las instrucciones sobre dnde y cmo usarlos. Las farmacias generalmente imprimen las instrucciones del medicamento slo en las cajas y no directamente en los  tubos del [place].   Si su medicamento es muy caro, por favor, pngase en contacto con Zigmund Daniel llamando al [phone] y presione la opcin 4 o envenos un mensaje a travs de Pharmacist, community.   No podemos decirle cul ser su copago por los medicamentos por adelantado ya que esto es diferente dependiendo de la cobertura de su seguro. Sin embargo, es posible que podamos encontrar un medicamento sustituto a Electrical engineer un formulario para que el seguro cubra el medicamento que se considera necesario.   Si se requiere una autorizacin previa para que su compaa de seguros [place] su medicamento, por favor permtanos de 1 a 2 das hbiles para completar este proceso.  Los precios de los medicamentos varan con frecuencia dependiendo del Environmental consultant de dnde se surte la receta y alguna farmacias pueden ofrecer precios ms baratos.  El sitio web www.goodrx.com tiene cupones para medicamentos de Airline pilot. Los precios aqu no tienen en cuenta lo que podra costar con la ayuda del seguro (puede  ser ms barato con su seguro), pero el sitio web puede darle el precio si no Field seismologist.  - Puede imprimir el cupn correspondiente y llevarlo con su receta a la farmacia.  - Tambin puede pasar por nuestra oficina durante el horario de atencin regular y Charity fundraiser una tarjeta de cupones de GoodRx.  - Si necesita que su receta se enve electrnicamente a una farmacia diferente, informe a nuestra oficina a travs de MyChart de [place] o por telfono llamando al [phone] y presione la opcin 4.

## 2023-01-03 NOTE — Progress Notes (Signed)
   Follow-Up Visit   Subjective  Yolanda Pace is a 57 y.o. female who presents for the following: Dry spots (Left cheek, glabella, right eyebrow, right cheek. Present for 3 months, dry, not itchy. Has tried multiple moisturizers, but areas stay dry. ).    The following portions of the chart were reviewed this encounter and updated as appropriate:       Review of Systems:  No other skin or systemic complaints except as noted in HPI or Assessment and Plan.  Objective  Well appearing patient in no apparent distress; mood and affect are within normal limits.  A focused examination was performed including face. Relevant physical exam findings are noted in the Assessment and Plan.  left malar cheek, nasal root, right eyebrow Pink scaliness.    Assessment & Plan  Dermatitis left malar cheek, nasal root, right eyebrow  Seborrheic Dermatitis vs Atopic Dermatitis, Chronic and persistent condition with duration or expected duration over one year. Condition is bothersome/symptomatic for patient. Currently flared.   Start Zoryve Cream Apply to AA face QD until improved 60g 1Rf. Sent to [facility]. Lot WBBD Exp 01/2024  Sample of CeraVe Psoriasis Cream given.   Seborrheic Dermatitis  -  is a chronic persistent rash characterized by pinkness and scaling most commonly of the mid face but also can occur on the scalp (dandruff), ears; mid chest, mid back and groin.  It tends to be exacerbated by stress and cooler weather.  People who have neurologic disease may experience new onset or exacerbation of existing seborrheic dermatitis.  The condition is not curable but treatable and can be controlled.  Atopic dermatitis (eczema) is a chronic, relapsing, pruritic condition that can significantly affect quality of life. It is often associated with allergic rhinitis and/or asthma and can require treatment with topical medications, phototherapy, or in severe cases biologic injectable medication (Dupixent;  Adbry) or Oral JAK inhibitors.   Roflumilast (ZORYVE) 0.3 % CREA - left malar cheek, nasal root, right eyebrow Apply to affected areas face once daily until improved.   Return as scheduled, for TBSE.  IJamesetta Orleans, CMA, am acting as scribe for Brendolyn Patty, MD .  Documentation: I have reviewed the above documentation for accuracy and completeness, and I agree with the above.  Brendolyn Patty MD

## 2023-03-23 ENCOUNTER — Encounter: Payer: Self-pay | Admitting: Dermatology

## 2023-03-23 ENCOUNTER — Ambulatory Visit: Payer: BC Managed Care – PPO | Admitting: Dermatology

## 2023-03-23 VITALS — BP 141/85 | HR 81

## 2023-03-23 DIAGNOSIS — L309 Dermatitis, unspecified: Secondary | ICD-10-CM

## 2023-03-23 MED ORDER — KETOCONAZOLE 2 % EX CREA: TOPICAL_CREAM | CUTANEOUS | 2 refills | Status: AC

## 2023-03-23 MED ORDER — EUCRISA 2 % EX OINT: TOPICAL_OINTMENT | CUTANEOUS | 1 refills | Status: AC

## 2023-03-23 NOTE — Patient Instructions (Addendum)
Start Ketoconazole 2% cream twice daily to affected areas on face. Apply 1st. Start Eucrisa ointment twice daily to affected areas on face. Apply 2nd. Sample given today.  If improves decrease to applying one in morning and one at bedtime.  If continues to improved can decrease to using topical that works best for you.      Gentle Skin Care Guide  1. Bathe no more than once a day.  2. Avoid bathing in hot water  3. Use a mild soap like Dove, Vanicream, Cetaphil, CeraVe. Can use Lever 2000 or Cetaphil antibacterial soap  4. Use soap only where you need it. On most days, use it under your arms, between your legs, and on your feet. Let the water rinse other areas unless visibly dirty.  5. When you get out of the bath/shower, use a towel to gently blot your skin dry, don't rub it.  6. While your skin is still a little damp, apply a moisturizing cream such as Vanicream, CeraVe, Cetaphil, Eucerin, Sarna lotion or plain Vaseline Jelly. For hands apply Neutrogena [place] Hand Cream or Excipial Hand Cream.  7. Reapply moisturizer any time you start to itch or feel dry.  8. Sometimes using free and clear laundry detergents can be helpful. Fabric softener sheets should be avoided. Downy Free & Gentle liquid, or any liquid fabric softener that is free of dyes and perfumes, it acceptable to use  9. If your doctor has given you prescription creams you may apply moisturizers over them      Due to recent changes in healthcare laws, you may see results of your pathology and/or laboratory studies on MyChart before the doctors have had a chance to review them. We understand that in some cases there may be results that are confusing or concerning to you. Please understand that not all results are received at the same time and often the doctors may need to interpret multiple results in order to provide you with the best plan of care or course of treatment. Therefore, we ask that you please give [place] 2  business days to thoroughly review all your results before contacting the office for clarification. Should we see a critical lab result, you will be contacted sooner.   If You Need Anything After Your Visit  If you have any questions or concerns for your doctor, please call our main line at [phone] and press option 4 to reach your doctor's medical assistant. If no one answers, please leave a voicemail as directed and we will return your call as soon as possible. Messages left after 4 pm will be answered the following business day.   You may also send [place] a message via MyChart. We typically respond to MyChart messages within 1-2 business days.  For prescription refills, please ask your pharmacy to contact our office. Our fax number is [phone].  If you have an urgent issue when the clinic is closed that cannot wait until the next business day, you can page your doctor at the number below.    Please note that while we do our best to be available for urgent issues outside of office hours, we are not available 24/7.   If you have an urgent issue and are unable to reach [place], you may choose to seek medical care at your doctor's office, retail clinic, urgent care center, or emergency room.  If you have a medical emergency, please immediately call 911 or go to the emergency department.  Pager Numbers  -  Dr. Gwen Pounds: [phone]  - Dr. Neale Burly: [phone]  - Dr. Roseanne Reno: [phone]  In the event of inclement weather, please call our main line at [phone] for an update on the status of any delays or closures.  Dermatology Medication Tips: Please keep the boxes that topical medications come in in order to help keep track of the instructions about where and how to use these. Pharmacies typically print the medication instructions only on the boxes and not directly on the medication tubes.   If your medication is too expensive, please contact our office at [phone] option 4 or  send [place] a message through MyChart.   We are unable to tell what your co-pay for medications will be in advance as this is different depending on your insurance coverage. However, we may be able to find a substitute medication at lower cost or fill out paperwork to get insurance to cover a needed medication.   If a prior authorization is required to get your medication covered by your insurance company, please allow [place] 1-2 business days to complete this process.  Drug prices often vary depending on where the prescription is filled and some pharmacies may offer cheaper prices.  The website www.goodrx.com contains coupons for medications through different pharmacies. The prices here do not account for what the cost may be with help from insurance (it may be cheaper with your insurance), but the website can give you the price if you did not use any insurance.  - You can print the associated coupon and take it with your prescription to the pharmacy.  - You may also stop by our office during regular business hours and pick up a GoodRx coupon card.  - If you need your prescription sent electronically to a different pharmacy, notify our office through [facility] or by phone at [phone] option 4.     Si Usted Necesita Algo Despus de Su Visita  Tambin puede enviarnos un mensaje a travs de Clinical cytogeneticist. Por lo general respondemos a los mensajes de MyChart en el transcurso de 1 a 2 das hbiles.  Para renovar recetas, por favor pida a su farmacia que se ponga en contacto con nuestra oficina. Annie Sable de fax es [place] [phone].  Si tiene un asunto urgente cuando la clnica est cerrada y que no puede esperar hasta el siguiente da hbil, puede llamar/localizar a su doctor(a) al nmero que aparece a continuacin.   Por favor, tenga en cuenta que aunque hacemos todo lo posible para estar disponibles para asuntos urgentes fuera del horario de [place], no estamos disponibles las 24 horas del  da, los 7 [address]  [address] de la [place].   Si tiene un problema urgente y no puede comunicarse con nosotros, puede optar por buscar atencin mdica  en el consultorio de su doctor(a), en una clnica privada, en un centro de atencin urgente o en una sala de emergencias.  Si tiene Engineer, drilling, por favor llame inmediatamente al 911 o vaya a la sala de emergencias.  Nmeros de bper  - Dr. Gwen Pounds: [phone]  - Dra. Moye: [phone]  - Dra. Roseanne Reno: [phone]  En caso de inclemencias del [place], por favor llame a Lacy Duverney principal al [phone] para una actualizacin sobre el [place] de cualquier retraso o cierre.  Consejos para la medicacin en dermatologa: Por favor, guarde las cajas en las que vienen los medicamentos de uso tpico para ayudarle a seguir las instrucciones sobre dnde y cmo usarlos. Las farmacias generalmente imprimen las instrucciones del medicamento  slo en las cajas y no directamente en los tubos del medicamento.   Si su medicamento es muy caro, por favor, pngase en contacto con Rolm Gala llamando al [phone] y presione la opcin 4 o envenos un mensaje a travs de Clinical cytogeneticist.   No podemos decirle cul ser su copago por los medicamentos por adelantado ya que esto es diferente dependiendo de la cobertura de su seguro. Sin embargo, es posible que podamos encontrar un medicamento sustituto a Audiological scientist un formulario para que el seguro cubra el medicamento que se considera necesario.   Si se requiere una autorizacin previa para que su compaa de seguros [place] su medicamento, por favor permtanos de 1 a 2 das hbiles para completar [address]  Los precios de los medicamentos varan con frecuencia dependiendo del Environmental consultant de dnde se surte la receta y alguna farmacias pueden ofrecer precios ms baratos.  El sitio web www.goodrx.com tiene cupones para medicamentos de Health and safety inspector. Los precios aqu no tienen en cuenta lo que podra  costar con la ayuda del seguro (puede ser ms barato con su seguro), pero el sitio web puede darle el precio si no utiliz Tourist information centre manager.  - Puede imprimir el cupn correspondiente y llevarlo con su receta a la farmacia.  - Tambin puede pasar por nuestra oficina durante el horario de atencin regular y Education officer, museum una tarjeta de cupones de GoodRx.  - Si necesita que su receta se enve electrnicamente a una farmacia diferente, informe a nuestra oficina a travs de MyChart de [place] o por telfono llamando al [phone] y presione la opcin 4.

## 2023-03-23 NOTE — Progress Notes (Signed)
   Follow Up Visit   Subjective  Yolanda Pace is a 57 y.o. female who presents for the following: Rash on face. Was seen here 01/03/2023 for same condition. Dryness is better but not resolved. Has used HC 2.5% cream and Zoryve- didn't help. Has not resolved. Not using anything right now.  Uses Cetaphil moisturizing sunscreen in mornings. Denies itching. C/O flaky, scaly skin.   The following portions of the chart were reviewed this encounter and updated as appropriate: medications, allergies, medical history  Review of Systems:  No other skin or systemic complaints except as noted in HPI or Assessment and Plan.  Objective  Well appearing patient in no apparent distress; mood and affect are within normal limits.  A focused examination was performed of the following areas: face  Relevant exam findings are noted in the Assessment and Plan.    Assessment & Plan     Dermatitis. Seborrheic dermatitis vs atopic dermatitis.  Exam: pink scaly patch at left infra-ocular and medial cheek, eyebrows clear today  Chronic and persistent condition with duration or expected duration over one year. Condition is symptomatic/ bothersome to patient. Not currently at goal.   Treatment Plan: Start Ketoconazole 2% cream twice daily to affected areas on face. Apply 1st. Start Eucrisa ointment twice daily to affected areas on face. Apply 2nd. Sample given today.  If improves decrease to applying one in morning and one at bedtime.    Return in about 1 month (around 04/23/2023) for Rash Follow Up.  I, Yolanda Pace, CMA, am acting as scribe for Willeen Niece, MD.   Documentation: I have reviewed the above documentation for accuracy and completeness, and I agree with the above.  Willeen Niece, MD

## 2023-03-25 NOTE — Progress Notes (Unsigned)
Name: Yolanda Pace   MRN: [id number]    DOB: [birth year]   Date:03/28/2023       Progress Note  Subjective  Chief Complaint  Follow Up  HPI  MDD recurrent : we adjusted Duloxetine from 20 mg to 30 mg, and she is was doing better, OCD symptoms had improved and mood was better, she is feeling more depressed, we gave her Wellbutrin Fall 2023 but she never started due to fear of side effects . Phq 9 is still  high She is sleeping better with Temazepam that she takes prn only ( Hydroxyzine caused her to feel groggy and Trazodone did not work), she found a Librarian, academic and she scheduled her first visit to August 2024, she as a lot of health anxiety and is afraid of getting a colonoscopy and finding out she has cancer.  Dizziness and abnormal Brain MRI: seen by ENT and saw neurologist had vestibular rehab and is feeling better now   IMPRESSION: 1. No internal auditory canal or cerebellopontine angle mass lesion or focus of abnormal contrast enhancement. 2. 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 Mass effect is noted on the anterior left temporal lobe. 3. Minimal amount of nonspecific T2 hyperintense lesions of the white matter, may represent early chronic microangiopathy  FMS/elevated CRP/Connective Tissue Disease : she was seen by Dr. Gareth Morgan in the past She states x-rays were done and also labs , inflammatory markers improved and she was given reassurance that is it OA and to continue to monitor.  Takes Duloxetine  30 mg She has daily pain, she also has multiple areas of OA and worse pain has been on her thumbs, she had more injection by ortho at [facility] - Dr. Landry Mellow and it help. Unchanged   DM in obese: glucose in urine, A1C was  6.4% , 6.1 % it went up to 7.5 % 5.9 % 5.5% down to 5.3 %, it is now stable at 6.3 %  discussed starting therapy but she would like to get depression under  control first.  . Discussed importance of statin therapy but it caused her muscle pain in the past , gave her zetia Fall 2023 and tolerating medication well . We will recheck labs today. She takes ARB   GERD: controlled with medication, denies heartburn or indigestion . Taking Omeprazole and symptoms are controlled   Mild intermittent asthma: seeing Dr. Welton Flakes and is off steroid inhaler, uses rescue and also on singulair  Doing well at this time    Chronic back pain with radiculitis: had lumbar laminectomy done by Dr. Adriana Simas June 2022 currently taking low dose duloxetine  No bowel or bladder incontinence. She is under the care of Dr. 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   Major depression, recurrent, chronic (HCC) 04/08/2022   Symptomatic varicose veins, left 03/19/2022   Palpitations 07/13/2021   Diabetes mellitus with microalbuminuria (HCC) 02/23/2021   Fibromyalgia 02/20/2021   Connective tissue disease (HCC) 10/31/2020   Chronic shoulder pain 07/07/2020   Mild intermittent asthma without complication 07/07/2020   Traumatic incomplete tear of left rotator cuff 04/23/2020   Arthralgia 08/03/2019   Gastric polyp    Benign neoplasm of transverse colon    Internal hemorrhoids  Malignant neoplasm of skin 06/19/2018   Chronic pain syndrome 06/19/2018   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   Kidney stone 02/20/2018   Renal lesion 02/20/2018   Benign hypertension 01/21/2018   Pelvic pain 12/26/2017   GERD (gastroesophageal reflux disease) 11/09/2017   Elevated beta-2 microglobulin 07/11/2017   Right thyroid nodule 05/26/2017   Mild hypercholesterolemia 05/06/2017   Generalized osteoarthritis of hand 12/13/2016   Impingement syndrome of shoulder region, left 11/29/2016   Anxiety     Allergic rhinitis    Vitamin D deficiency disease     Past Surgical History:  Procedure Laterality Date   ABDOMINAL HYSTERECTOMY  2005ish   fibroids, ovaries remain; along with bladder tack   BACK SURGERY     Lower back   bladder sling mesh removal  08/2018   bladder tack  2005ish   CHOLECYSTECTOMY     COLONOSCOPY WITH PROPOFOL N/A 06/22/2018   Procedure: COLONOSCOPY WITH PROPOFOL;  Surgeon: Pasty Spillers, MD;  Location: [facility] END[facility];  Service: Endoscopy;  Laterality: N/A;   CYSTO WITH HYDRODISTENSION N/A 05/02/2018   Procedure: CYSTOSCOPY/HYDRODISTENSION;  Surgeon: Orson Ape, MD;  Location: [facility] ORS;  Service: Urology;  Laterality: N/A;   ESOPHAGOGASTRODUODENOSCOPY (EGD) WITH PROPOFOL N/A 06/22/2018   Procedure: ESOPHAGOGASTRODUODENOSCOPY (EGD) WITH PROPOFOL;  Surgeon: Pasty Spillers, MD;  Location: [facility] ENDOSCOPY;  Service: Endoscopy;  Laterality: N/A;   HEMI-MICRODISCECTOMY LUMBAR LAMINECTOMY LEVEL 1 Left 04/27/2021   Procedure: LEFT L4-5 HEMILAMINECTOMY;  Surgeon: Lucy Chris, MD;  Location: [facility] ORS;  Service: Neurosurgery;  Laterality: Left;   INCONTINENCE SURGERY     ROTATOR CUFF REPAIR Left 01/08/2019   rotator cuff revision Left 12/06/2019   SKIN CANCER EXCISION  06/2014   removed from back    Family History  Problem Relation Age of Onset   Hyperlipidemia Mother    Diabetes Sister    Hyperlipidemia Sister    Hypertension Sister    Hypertension Brother    Hypertension Son    Kidney Stones Son    Pneumonia Maternal Grandmother    Heart attack Maternal Grandfather    Hypertension Sister    Epilepsy Sister    Hypertension Sister    Hypertension Daughter    Kidney Stones Daughter    Cancer Neg Hx    COPD Neg Hx    Stroke Neg Hx     Social History   Tobacco Use   Smoking status: Never   Smokeless tobacco: Never  Substance Use Topics   Alcohol use: Yes    Comment: occ     Current Outpatient Medications:    acetaminophen (TYLENOL)  500 MG tablet, Take 1,000 mg by mouth every 8 (eight) hours as needed for moderate pain., Disp: , Rfl:    albuterol (PROVENTIL) (2.5 MG/3ML) 0.083% nebulizer solution, Take 3 mLs (2.5 mg total) by nebulization every 4 (four) hours as needed for wheezing or shortness of breath., Disp: 75 mL, Rfl: 1   albuterol (VENTOLIN HFA) 108 (90 Base) MCG/ACT inhaler, Inhale 2 puffs into the lungs 4 (four) times daily as needed for shortness of breath or wheezing., Disp: , Rfl:    blood glucose meter kit and supplies, Dispense based on patient and insurance preference. Use up to four times daily as directed. (FOR ICD-10 E10.9, E11.9)., Disp: 1 each, Rfl: 0   budesonide-formoterol (SYMBICORT) 160-4.5 MCG/ACT inhaler, Inhale 2 puffs into the lungs 2 (two) times daily., Disp: 1 each, Rfl: 3   Cholecalciferol (VITAMIN D) 2000 units tablet, Take  2,000 Units by mouth daily. , Disp: , Rfl:    Crisaborole (EUCRISA) 2 % OINT, Apply twice daily to affected areas on face, Disp: 60 g, Rfl: 1   ezetimibe (ZETIA) 10 MG tablet, Take 1 tablet (10 mg total) by mouth daily., Disp: 90 tablet, Rfl: 3   fluticasone (FLONASE) 50 MCG/ACT nasal spray, Place into both nostrils., Disp: , Rfl:    hydrocortisone 2.5 % cream, Apply 1-2 times daily for up to 1 week to affected areas of rash., Disp: 30 g, Rfl: 1   ibuprofen (ADVIL) 200 MG tablet, Take 400-600 mg by mouth every 8 (eight) hours as needed for moderate pain., Disp: , Rfl:    ketoconazole (NIZORAL) 2 % cream, Apply twice daily to affected areas on face., Disp: 30 g, Rfl: 2   levocetirizine (XYZAL) 5 MG tablet, Take 1 tablet (5 mg total) by mouth every evening., Disp: 90 tablet, Rfl: 1   montelukast (SINGULAIR) 10 MG tablet, Take 1 tablet (10 mg total) by mouth at bedtime., Disp: 90 tablet, Rfl: 1   olmesartan (BENICAR) 40 MG tablet, Take 1 tablet (40 mg total) by mouth daily., Disp: 90 tablet, Rfl: 1   omeprazole (PRILOSEC) 20 MG capsule, Take 1 capsule (20 mg total) by mouth every  morning., Disp: 90 capsule, Rfl: 1   Oxymetazoline HCl (RHOFADE) 1 % CREA, Apply once daily in the morning., Disp: 30 g, Rfl: 2   Roflumilast (ZORYVE) 0.3 % CREA, Apply to affected areas face once daily until improved., Disp: 60 g, Rfl: 1   temazepam (RESTORIL) 7.5 MG capsule, Take 1 capsule (7.5 mg total) by mouth at bedtime., Disp: 90 capsule, Rfl: 0   DULoxetine (CYMBALTA) 30 MG capsule, Take 1 capsule (30 mg total) by mouth daily., Disp: 90 capsule, Rfl: 0  Allergies  Allergen Reactions   Betadine [Povidone Iodine] Swelling, Dermatitis and Rash    Pt states site gets infected   Metrizamide Swelling    Noted in [facility] chart on 01/06/2012. Premedicate patient prior to contrast media injections.   Other     Mescalor: vomiting    Salicylic Acid-Sulfur     Burst eye vessels   Bee Pollen     Sneezing, watery eyes, coughing.    Meloxicam Itching   Pollen Extract Other (See Comments)    Sneezing, watery eyes, coughing.    Sulindac Anxiety and Other (See Comments)   Compazine  [Prochlorperazine] Other (See Comments)    Jaws locked up   Contrast Media [Iodinated Contrast Media] Swelling    Noted in [facility] chart on 01/06/2012. Premedicate patient prior to contrast media injections.   Doxycycline Nausea Only    GI upset   Elemental Sulfur Other (See Comments)    Made all blood vessels in eyes burst   Gabapentin Nausea And Vomiting    Lip numbness    Keppra [Levetiracetam]     Pt was not herself    Lyrica [Pregabalin]     Tingling, depression, blister mouth    Rosuvastatin     Mental fogginess, confusion, fatigue    Codeine Nausea And Vomiting   Hydroxychloroquine Nausea And Vomiting and Nausea Only    Ear clicking   Iodine Swelling   Sulfa Antibiotics Nausea And Vomiting and Other (See Comments)    I personally reviewed active problem list, medication list, allergies, family history, social history, notes from last encounter with the patient/caregiver  today.   ROS  Constitutional: Negative for fever or weight change.  Respiratory: Negative for cough and shortness of breath.   Cardiovascular: Negative for chest pain or palpitations.  Gastrointestinal: Negative for abdominal pain, no bowel changes.  Musculoskeletal: Negative for gait problem or joint swelling.  Skin: Negative for rash.  Neurological: Negative for dizziness or headache.  No other specific complaints in a complete review of systems (except as listed in HPI above).   Objective   Vitals:   03/28/23 0920 03/28/23 0924  BP: 138/84 136/82  Pulse: 82   Resp: 16   SpO2: 93%   Weight: 187 lb (84.8 kg)   Height: 5\' 3"  (1.6 m)     Body mass index is 33.13 kg/m.  Physical Exam  Constitutional: Patient appears well-developed and well-nourished. Obese  No distress.  HEENT: head atraumatic, normocephalic, pupils equal and reactive to light, neck supple Cardiovascular: Normal rate, regular rhythm and normal heart sounds.  No murmur heard. No BLE edema. Pulmonary/Chest: Effort normal and breath sounds normal. No respiratory distress. Abdominal: Soft.  There is no tenderness. Psychiatric: Patient has a normal mood and affect. behavior is normal. Judgment and thought content normal.   Recent Results (from the past 2160 hour(s))  POCT HgB A1C     Status: Abnormal   Collection Time: 03/28/23  8:44 AM  Result Value Ref Range   Hemoglobin A1C 6.3 (A) 4.0 - 5.6 %   HbA1c POC (<> result, manual entry)     HbA1c, POC (prediabetic range)     HbA1c, POC (controlled diabetic range)       Diabetic Foot Exam: Diabetic Foot Exam - Simple   Simple Foot Form Visual Inspection No deformities, no ulcerations, no other skin breakdown bilaterally: Yes Sensation Testing Intact to touch and monofilament testing bilaterally: Yes Pulse Check Posterior Tibialis and Dorsalis pulse intact bilaterally: Yes Comments      PHQ2/9:    03/28/2023    8:43 AM 12/27/2022    9:10 AM  08/24/2022    8:48 AM 05/10/2022    3:14 PM 04/08/2022   10:30 AM  Depression screen PHQ 2/9  Decreased Interest 2 2 2 1 1   Down, Depressed, Hopeless 2 2 2 2 1   PHQ - 2 Score 4 4 4 3 2   Altered sleeping 3 2 1 2 3   Tired, decreased energy 3 1 1 2 3   Change in appetite 1 3 3  0 2  Feeling bad or failure about yourself  2 2 1  0 2  Trouble concentrating 2 3 3 3 3   Moving slowly or fidgety/restless 0 0 0 0 0  Suicidal thoughts 2 2 2  0 0  PHQ-9 Score 17 17 15 10 15   Difficult doing work/chores   Very difficult Somewhat difficult     phq 9 is positive   Fall Risk:    03/28/2023    8:43 AM 12/27/2022    9:09 AM 08/24/2022    8:44 AM 05/10/2022    3:07 PM 04/08/2022   10:30 AM  Fall Risk   Falls in the past year? 0 0 0 0 0  Number falls in past yr: 0 0  0 0  Injury with Fall? 0 0  0 0  Risk for fall due to : No Fall Risks No Fall Risks No Fall Risks No Fall Risks No Fall Risks  Follow up Falls prevention discussed Falls prevention discussed Falls prevention discussed Falls prevention discussed;Education provided Falls prevention discussed      Functional Status Survey: Is the patient deaf or have difficulty hearing?:  No Does the patient have difficulty seeing, even when wearing glasses/contacts?: No Does the patient have difficulty concentrating, remembering, or making decisions?: Yes Does the patient have difficulty walking or climbing stairs?: Yes Does the patient have difficulty dressing or bathing?: No Does the patient have difficulty doing errands alone such as visiting a doctor's office or shopping?: Yes    Assessment & Plan  1. Dyslipidemia associated with type 2 diabetes mellitus (HCC)  - POCT HgB A1C - COMPLETE METABOLIC PANEL WITH GFR - [facility] Diabetes Foot Exam - Lipid panel  2. Major depression, recurrent, chronic (HCC)  - DULoxetine (CYMBALTA) 30 MG capsule; Take 1 capsule (30 mg total) by mouth daily.  Dispense: 90 capsule; Refill: 0  3. Connective tissue  disease (HCC)  Seen by rheumatologist, on prn medication for now   4. Primary osteoarthritis involving multiple joints  - DULoxetine (CYMBALTA) 30 MG capsule; Take 1 capsule (30 mg total) by mouth daily.  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 GERD without esophagitis  Doing well   7. Asthma, mild intermittent, well-controlled  Doing well on montelukast   8. Fibromyalgia  - DULoxetine (CYMBALTA) 30 MG capsule; Take 1 capsule (30 mg total) by mouth daily.  Dispense: 90 capsule; Refill: 0  9. Benign hypertension  -CBC with Differential/Platelet

## 2023-03-28 ENCOUNTER — Ambulatory Visit: Payer: BC Managed Care – PPO | Admitting: Family Medicine

## 2023-03-28 ENCOUNTER — Encounter: Payer: Self-pay | Admitting: Family Medicine

## 2023-03-28 VITALS — BP 136/82 | HR 82 | Resp 16 | Ht 63.0 in | Wt 187.0 lb

## 2023-03-28 DIAGNOSIS — F339 Major depressive disorder, recurrent, unspecified: Secondary | ICD-10-CM

## 2023-03-28 DIAGNOSIS — E785 Hyperlipidemia, unspecified: Secondary | ICD-10-CM

## 2023-03-28 DIAGNOSIS — I10 Essential (primary) hypertension: Secondary | ICD-10-CM

## 2023-03-28 DIAGNOSIS — M797 Fibromyalgia: Secondary | ICD-10-CM

## 2023-03-28 DIAGNOSIS — J4520 Mild intermittent asthma, uncomplicated: Secondary | ICD-10-CM

## 2023-03-28 DIAGNOSIS — E1169 Type 2 diabetes mellitus with other specified complication: Secondary | ICD-10-CM

## 2023-03-28 DIAGNOSIS — K219 Gastro-esophageal reflux disease without esophagitis: Secondary | ICD-10-CM

## 2023-03-28 DIAGNOSIS — M359 Systemic involvement of connective tissue, unspecified: Secondary | ICD-10-CM

## 2023-03-28 DIAGNOSIS — F5105 Insomnia due to other mental disorder: Secondary | ICD-10-CM

## 2023-03-28 DIAGNOSIS — M150 Primary generalized (osteo)arthritis: Secondary | ICD-10-CM

## 2023-03-28 DIAGNOSIS — M159 Polyosteoarthritis, unspecified: Secondary | ICD-10-CM

## 2023-03-28 LAB — POCT GLYCOSYLATED HEMOGLOBIN (HGB A1C): Hemoglobin A1C: 6.3 % — AB (ref 4.0–5.6)

## 2023-03-28 MED ORDER — DULOXETINE HCL 30 MG PO CPEP: 30.0000 mg | ORAL_CAPSULE | Freq: Every day | ORAL | 0 refills | Status: DC

## 2023-03-29 LAB — CBC WITH DIFFERENTIAL/PLATELET
Absolute Monocytes: 401 cells/uL (ref 200–950)
Basophils Absolute: 89 cells/uL (ref 0–200)
Basophils Relative: 1.5 %
Eosinophils Absolute: 183 cells/uL (ref 15–500)
Eosinophils Relative: 3.1 %
HCT: 41.1 % (ref 35.0–45.0)
Hemoglobin: 13.6 g/dL (ref 11.7–15.5)
Lymphs Abs: 2154 cells/uL (ref 850–3900)
MCH: 29.6 pg (ref 27.0–33.0)
MCHC: 33.1 g/dL (ref 32.0–36.0)
MCV: 89.3 fL (ref 80.0–100.0)
MPV: 9.8 fL (ref 7.5–12.5)
Monocytes Relative: 6.8 %
Neutro Abs: 3074 cells/uL (ref 1500–7800)
Neutrophils Relative %: 52.1 %
Platelets: 282 10*3/uL (ref 140–400)
RBC: 4.6 10*6/uL (ref 3.80–5.10)
RDW: 12.1 % (ref 11.0–15.0)
Total Lymphocyte: 36.5 %
WBC: 5.9 10*3/uL (ref 3.8–10.8)

## 2023-03-29 LAB — COMPLETE METABOLIC PANEL WITH GFR
AG Ratio: 1.8 (calc) (ref 1.0–2.5)
ALT: 20 U/L (ref 6–29)
AST: 13 U/L (ref 10–35)
Albumin: 4.2 g/dL (ref 3.6–5.1)
Alkaline phosphatase (APISO): 79 U/L (ref 37–153)
BUN: 15 mg/dL (ref 7–25)
CO2: 28 mmol/L (ref 20–32)
Calcium: 9.8 mg/dL (ref 8.6–10.4)
Chloride: 106 mmol/L (ref 98–110)
Creat: 0.59 mg/dL (ref 0.50–1.03)
Globulin: 2.3 g/dL (calc) (ref 1.9–3.7)
Glucose, Bld: 125 mg/dL — ABNORMAL HIGH (ref 65–99)
Potassium: 4.6 mmol/L (ref 3.5–5.3)
Sodium: 142 mmol/L (ref 135–146)
Total Bilirubin: 0.5 mg/dL (ref 0.2–1.2)
Total Protein: 6.5 g/dL (ref 6.1–8.1)
eGFR: 105 mL/min/{1.73_m2} (ref >=60)

## 2023-03-29 LAB — LIPID PANEL
Cholesterol: 235 mg/dL — ABNORMAL HIGH (ref <200)
HDL: 51 mg/dL (ref >=50)
LDL Cholesterol (Calc): 152 mg/dL (calc) — ABNORMAL HIGH
Non-HDL Cholesterol (Calc): 184 mg/dL (calc) — ABNORMAL HIGH (ref <130)
Total CHOL/HDL Ratio: 4.6 (calc) (ref <5.0)
Triglycerides: 185 mg/dL — ABNORMAL HIGH (ref <150)

## 2023-04-01 ENCOUNTER — Other Ambulatory Visit: Payer: Self-pay

## 2023-04-01 ENCOUNTER — Telehealth: Payer: Self-pay

## 2023-04-01 DIAGNOSIS — Z86010 Personal history of colonic polyps: Secondary | ICD-10-CM

## 2023-04-01 MED ORDER — NA SULFATE-K SULFATE-MG SULF 17.5-3.13-1.6 GM/177ML PO SOLN: 1.0000 | Freq: Once | ORAL | 0 refills | Status: AC

## 2023-04-01 NOTE — Telephone Encounter (Signed)
Gastroenterology Pre-Procedure Review  Request Date: 04/28/23 Requesting Physician: Dr. Allegra Lai  PATIENT REVIEW QUESTIONS: The patient responded to the following health history questions as indicated:    1. Are you having any GI issues? no 2. Do you have a personal history of Polyps? yes (last colonoscopy performed by Dr. Maximino Greenland on 06/22/18 polyps were noted) 3. Do you have a family history of Colon Cancer or Polyps? no 4. Diabetes Mellitus? no 5. Joint replacements in the past 12 months?no 6. Major health problems in the past 3 months?no 7. Any artificial heart valves, MVP, or defibrillator?no    MEDICATIONS & ALLERGIES:    Patient reports the following regarding taking any anticoagulation/antiplatelet therapy:   Plavix, Coumadin, Eliquis, Xarelto, Lovenox, Pradaxa, Brilinta, or Effient? no Aspirin? no  Patient confirms/reports the following medications:  Current Outpatient Medications  Medication Sig Dispense Refill   acetaminophen (TYLENOL) 500 MG tablet Take 1,000 mg by mouth every 8 (eight) hours as needed for moderate pain.     albuterol (PROVENTIL) (2.5 MG/3ML) 0.083% nebulizer solution Take 3 mLs (2.5 mg total) by nebulization every 4 (four) hours as needed for wheezing or shortness of breath. 75 mL 1   albuterol (VENTOLIN HFA) 108 (90 Base) MCG/ACT inhaler Inhale 2 puffs into the lungs 4 (four) times daily as needed for shortness of breath or wheezing.     blood glucose meter kit and supplies Dispense based on patient and insurance preference. Use up to four times daily as directed. (FOR ICD-10 E10.9, E11.9). 1 each 0   budesonide-formoterol (SYMBICORT) 160-4.5 MCG/ACT inhaler Inhale 2 puffs into the lungs 2 (two) times daily. 1 each 3   Cholecalciferol (VITAMIN D) 2000 units tablet Take 2,000 Units by mouth daily.      Crisaborole (EUCRISA) 2 % OINT Apply twice daily to affected areas on face 60 g 1   DULoxetine (CYMBALTA) 30 MG capsule Take 1 capsule (30 mg total) by mouth  daily. 90 capsule 0   ezetimibe (ZETIA) 10 MG tablet Take 1 tablet (10 mg total) by mouth daily. 90 tablet 3   fluticasone (FLONASE) 50 MCG/ACT nasal spray Place into both nostrils.     hydrocortisone 2.5 % cream Apply 1-2 times daily for up to 1 week to affected areas of rash. 30 g 1   ibuprofen (ADVIL) 200 MG tablet Take 400-600 mg by mouth every 8 (eight) hours as needed for moderate pain.     ketoconazole (NIZORAL) 2 % cream Apply twice daily to affected areas on face. 30 g 2   levocetirizine (XYZAL) 5 MG tablet Take 1 tablet (5 mg total) by mouth every evening. 90 tablet 1   montelukast (SINGULAIR) 10 MG tablet Take 1 tablet (10 mg total) by mouth at bedtime. 90 tablet 1   olmesartan (BENICAR) 40 MG tablet Take 1 tablet (40 mg total) by mouth daily. 90 tablet 1   omeprazole (PRILOSEC) 20 MG capsule Take 1 capsule (20 mg total) by mouth every morning. 90 capsule 1   Oxymetazoline HCl (RHOFADE) 1 % CREA Apply once daily in the morning. 30 g 2   Roflumilast (ZORYVE) 0.3 % CREA Apply to affected areas face once daily until improved. 60 g 1   temazepam (RESTORIL) 7.5 MG capsule Take 1 capsule (7.5 mg total) by mouth at bedtime. 90 capsule 0   No current facility-administered medications for this visit.    Patient confirms/reports the following allergies:  Allergies  Allergen Reactions   Betadine [Povidone Iodine] Swelling, Dermatitis and Rash  Pt states site gets infected   Metrizamide Swelling    Noted in [facility]. chart on 01/06/2012. Premedicate patient prior to contrast media injections.   Other     Mescalor: vomiting    Salicylic Acid-Sulfur     Burst eye vessels   Bee Pollen     Sneezing, watery eyes, coughing.    Meloxicam Itching   Pollen Extract Other (See Comments)    Sneezing, watery eyes, coughing.    Sulindac Anxiety and Other (See Comments)   Compazine  [Prochlorperazine] Other (See Comments)    Jaws locked up   Contrast Media [Iodinated Contrast Media] Swelling     Noted in [facility] chart on 01/06/2012. Premedicate patient prior to contrast media injections.   Doxycycline Nausea Only    GI upset   Elemental Sulfur Other (See Comments)    Made all blood vessels in eyes burst   Gabapentin Nausea And Vomiting    Lip numbness    Keppra [Levetiracetam]     Pt was not herself    Lyrica [Pregabalin]     Tingling, depression, blister mouth    Rosuvastatin     Mental fogginess, confusion, fatigue    Codeine Nausea And Vomiting   Hydroxychloroquine Nausea And Vomiting and Nausea Only    Ear clicking   Iodine Swelling   Sulfa Antibiotics Nausea And Vomiting and Other (See Comments)    No orders of the defined types were placed in this encounter.   AUTHORIZATION INFORMATION Primary Insurance: 1D#: Group #:  Secondary Insurance: 1D#: Group #:  SCHEDULE INFORMATION: Date: 04/28/23 Time: Location: [facility]

## 2023-04-01 NOTE — Telephone Encounter (Signed)
Pt left vmm to schedule colonoscopy please return call 

## 2023-04-04 ENCOUNTER — Telehealth: Payer: Self-pay | Admitting: Cardiovascular Disease

## 2023-04-04 DIAGNOSIS — E7800 Pure hypercholesterolemia, unspecified: Secondary | ICD-10-CM

## 2023-04-04 NOTE — Telephone Encounter (Signed)
Pt would like a callback regarding a Calcium Score. Please advise

## 2023-04-12 NOTE — Telephone Encounter (Signed)
Pt made aware and calcium score order placed. Pt also provided the below number to schedule at her convenience  - Call [phone] .   Antonieta Iba, MD  [facility] Triage23 hours ago (9:07 AM)    Yes we can order CT calcium scoring Thx TG

## 2023-04-20 ENCOUNTER — Telehealth: Payer: Self-pay

## 2023-04-20 ENCOUNTER — Ambulatory Visit: Payer: BC Managed Care – PPO | Admitting: Dermatology

## 2023-04-20 NOTE — Telephone Encounter (Signed)
Patient has requested to cancel her colonoscopy scheduled with Dr. Allegra Lai on 04/28/23.  She said she is having trouble swallowing.  I informed her that we could see her in the office for this and schedule her to see our PA-Tina.  She declined stating that she has had a nodule on her thyroid that ENT doctor has been following for years and will get his opinion first.  Then if he decides she needs an EGD she will call our office to schedule an office visit.    Colonoscopy has been canceled with Trish in Endo.  Thanks, [place], [place]

## 2023-04-20 NOTE — Telephone Encounter (Signed)
Pt left  message to cancel and reschedule procedure please return call

## 2023-04-22 NOTE — Telephone Encounter (Signed)
error 

## 2023-04-28 ENCOUNTER — Ambulatory Visit: Admit: 2023-04-28 | Payer: BC Managed Care – PPO | Admitting: Gastroenterology

## 2023-04-28 SURGERY — COLONOSCOPY WITH PROPOFOL
Anesthesia: General

## 2023-05-02 ENCOUNTER — Ambulatory Visit
Admission: RE | Admit: 2023-05-02 | Discharge: 2023-05-02 | Disposition: A | Discharge status: Home / Self Care | Payer: BC Managed Care – PPO | Source: Ambulatory Visit | Attending: Cardiovascular Disease | Admitting: Cardiovascular Disease

## 2023-05-02 DIAGNOSIS — E7800 Pure hypercholesterolemia, unspecified: Secondary | ICD-10-CM | POA: Insufficient documentation

## 2023-05-05 ENCOUNTER — Other Ambulatory Visit: Payer: Self-pay | Admitting: Otolaryngology

## 2023-05-05 DIAGNOSIS — E042 Nontoxic multinodular goiter: Secondary | ICD-10-CM

## 2023-05-17 ENCOUNTER — Ambulatory Visit
Admission: RE | Admit: 2023-05-17 | Discharge: 2023-05-17 | Disposition: A | Discharge status: Home / Self Care | Payer: BC Managed Care – PPO | Source: Ambulatory Visit | Attending: Otolaryngology | Admitting: Otolaryngology

## 2023-05-17 DIAGNOSIS — E042 Nontoxic multinodular goiter: Secondary | ICD-10-CM

## 2023-05-20 ENCOUNTER — Other Ambulatory Visit: Payer: Self-pay | Admitting: Otolaryngology

## 2023-05-20 DIAGNOSIS — E041 Nontoxic single thyroid nodule: Secondary | ICD-10-CM

## 2023-06-10 ENCOUNTER — Ambulatory Visit
Admission: RE | Admit: 2023-06-10 | Discharge: 2023-06-10 | Disposition: A | Discharge status: Home / Self Care | Payer: BC Managed Care – PPO | Source: Ambulatory Visit | Attending: Family Medicine | Admitting: Family Medicine

## 2023-06-10 DIAGNOSIS — N6489 Other specified disorders of breast: Secondary | ICD-10-CM

## 2023-06-18 ENCOUNTER — Telehealth: Payer: Self-pay

## 2023-06-18 ENCOUNTER — Telehealth: Payer: BC Managed Care – PPO | Admitting: Family Medicine

## 2023-06-18 DIAGNOSIS — U071 COVID-19: Secondary | ICD-10-CM | POA: Diagnosis not present

## 2023-06-18 MED ORDER — NIRMATRELVIR/RITONAVIR (PAXLOVID)TABLET: 3.0000 | ORAL_TABLET | Freq: Two times a day (BID) | ORAL | 0 refills | Status: AC

## 2023-06-18 MED ORDER — ONDANSETRON HCL 4 MG PO TABS: 8.0000 mg | ORAL_TABLET | Freq: Three times a day (TID) | ORAL | 0 refills | Status: AC | PRN

## 2023-06-18 MED ORDER — MOLNUPIRAVIR EUA 200MG CAPSULE: 4.0000 | ORAL_CAPSULE | Freq: Two times a day (BID) | ORAL | 0 refills | Status: AC

## 2023-06-18 NOTE — Patient Instructions (Signed)

## 2023-06-18 NOTE — Addendum Note (Signed)
Addended by: Georgana Curio on: 06/18/2023 06:33 PM   Modules accepted: Orders

## 2023-06-18 NOTE — Telephone Encounter (Signed)
Daughter Jamyiah Labella who was at the pharmacy picking up medication ordered for her mother by the provider, called on her mother's behalf to request Paxlovid ( @ 6:18pm).  Daughter states that the medication ordered for COVID is $600.00 and now wants Paxlovid which is much more reasonably priced.  Mother had previously refused Paxlovid because she was afraid it would interact negatively with her Cymbalta. Pharmacist has assured the daughter there is no literature suggesting an interaction between the two medications.   I called the patient and spoke to her directly and confirmed that the patient would take Paxlovid based on her daughter's recommendation..  She remains concerned about the interaction of the two medications but was reassured by the provider earlier today, that there was no documented interaction with the two medications and that it was initially recommended as the drug of choice when she spoke to the provider.   I spoke with Georgana Curio, APP who has placed the order for Paxlovid and the patient's daughter is going to pick it up. Patient and her daughter were on the call with Georgana Curio, earlier today.   Susette Racer, RN Virtual Care & Clinical Informatics 06/18/2023 9:19 PM

## 2023-06-18 NOTE — Progress Notes (Signed)
Virtual Visit Consent   Yolanda Pace, you are scheduled for a virtual visit with a [facility] provider today. Just as with appointments in the office, your consent must be obtained to participate. Your consent will be active for this visit and any virtual visit you may have with one of our providers in the next 365 days. If you have a MyChart account, a copy of this consent can be sent to you electronically.  As this is a virtual visit, video technology does not allow for your provider to perform a traditional examination. This may limit your provider's ability to fully assess your condition. If your provider identifies any concerns that need to be evaluated in person or the need to arrange testing (such as labs, EKG, etc.), we will make arrangements to do so. Although advances in technology are sophisticated, we cannot ensure that it will always work on either your end or our end. If the connection with a video visit is poor, the visit may have to be switched to a telephone visit. With either a video or telephone visit, we are not always able to ensure that we have a secure connection.  By engaging in this virtual visit, you consent to the provision of healthcare and authorize for your insurance to be billed (if applicable) for the services provided during this visit. Depending on your insurance coverage, you may receive a charge related to this service.  I need to obtain your verbal consent now. Are you willing to proceed with your visit today? Yolanda Pace has provided verbal consent on 06/18/2023 for a virtual visit (video or telephone). Yolanda Curio, FNP  Date: 06/18/2023 4:12 PM  Virtual Visit via Video Note   I, Yolanda Pace, connected with  Yolanda Pace  ([id number], [birth year]) on 06/18/23 at  4:00 PM EDT by a video-enabled telemedicine application and verified that I am speaking with the correct person using two identifiers.  Location: Patient: Virtual Visit Location Patient: Home Provider:  Virtual Visit Location Provider: Home Office   I discussed the limitations of evaluation and management by telemedicine and the availability of in person appointments. The patient expressed understanding and agreed to proceed.    History of Present Illness: Yolanda Pace is a 57 y.o. who identifies as a female who was assigned female at birth, and is being seen today for covid positive testing today with sx since yesterday. Reports fever cough, headache, nausea and vomiting. She had covid 2 yrs ago and ended up in ICU on a vent. She is not wheezing and has no sob. She is in no distress. Her daughter is helping her with the call. 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   Major depression, recurrent, chronic (HCC) 04/08/2022   Symptomatic varicose veins, left 03/19/2022   Palpitations 07/13/2021   Diabetes mellitus with microalbuminuria (HCC) 02/23/2021   Fibromyalgia 02/20/2021   Connective tissue disease (HCC) 10/31/2020   Chronic shoulder pain 07/07/2020   Mild intermittent asthma without complication 07/07/2020   Traumatic incomplete tear of left rotator cuff 04/23/2020   Arthralgia 08/03/2019   Gastric polyp    Benign neoplasm of transverse colon    Internal hemorrhoids    Malignant neoplasm of skin 06/19/2018   Chronic pain syndrome 06/19/2018   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   Kidney stone 02/20/2018   Renal lesion 02/20/2018   Benign hypertension  01/21/2018   Pelvic pain 12/26/2017   GERD (gastroesophageal reflux disease) 11/09/2017   Elevated beta-2 microglobulin 07/11/2017   Right thyroid nodule 05/26/2017   Mild hypercholesterolemia 05/06/2017   Generalized osteoarthritis of hand 12/13/2016   Impingement syndrome of shoulder region, left 11/29/2016   Anxiety    Allergic rhinitis    Vitamin D deficiency disease     Allergies:  Allergies  Allergen  Reactions   Betadine [Povidone Iodine] Swelling, Dermatitis and Rash    Pt states site gets infected   Metrizamide Swelling    Noted in [facility] chart on 01/06/2012. Premedicate patient prior to contrast media injections.   Other     Mescalor: vomiting    Salicylic Acid-Sulfur     Burst eye vessels   Bee Pollen     Sneezing, watery eyes, coughing.    Meloxicam Itching   Pollen Extract Other (See Comments)    Sneezing, watery eyes, coughing.    Sulindac Anxiety and Other (See Comments)   Compazine  [Prochlorperazine] Other (See Comments)    Jaws locked up   Contrast Media [Iodinated Contrast Media] Swelling    Noted in [facility] chart on 01/06/2012. Premedicate patient prior to contrast media injections.   Doxycycline Nausea Only    GI upset   Elemental Sulfur Other (See Comments)    Made all blood vessels in eyes burst   Gabapentin Nausea And Vomiting    Lip numbness    Keppra [Levetiracetam]     Pt was not herself    Lyrica [Pregabalin]     Tingling, depression, blister mouth    Rosuvastatin     Mental fogginess, confusion, fatigue    Codeine Nausea And Vomiting   Hydroxychloroquine Nausea And Vomiting and Nausea Only    Ear clicking   Iodine Swelling   Sulfa Antibiotics Nausea And Vomiting and Other (See Comments)   Medications:  Current Outpatient Medications:    acetaminophen (TYLENOL) 500 MG tablet, Take 1,000 mg by mouth every 8 (eight) hours as needed for moderate pain., Disp: , Rfl:    albuterol (PROVENTIL) (2.5 MG/3ML) 0.083% nebulizer solution, Take 3 mLs (2.5 mg total) by nebulization every 4 (four) hours as needed for wheezing or shortness of breath., Disp: 75 mL, Rfl: 1   albuterol (VENTOLIN HFA) 108 (90 Base) MCG/ACT inhaler, Inhale 2 puffs into the lungs 4 (four) times daily as needed for shortness of breath or wheezing., Disp: , Rfl:    blood glucose meter kit and supplies, Dispense based on patient and insurance preference. Use up to four times daily as  directed. (FOR ICD-10 E10.9, E11.9)., Disp: 1 each, Rfl: 0   budesonide-formoterol (SYMBICORT) 160-4.5 MCG/ACT inhaler, Inhale 2 puffs into the lungs 2 (two) times daily., Disp: 1 each, Rfl: 3   Cholecalciferol (VITAMIN D) 2000 units tablet, Take 2,000 Units by mouth daily. , Disp: , Rfl:    Crisaborole (EUCRISA) 2 % OINT, Apply twice daily to affected areas on face, Disp: 60 g, Rfl: 1   DULoxetine (CYMBALTA) 30 MG capsule, Take 1 capsule (30 mg total) by mouth daily., Disp: 90 capsule, Rfl: 0   ezetimibe (ZETIA) 10 MG tablet, Take 1 tablet (10 mg total) by mouth daily., Disp: 90 tablet, Rfl: 3   fluticasone (FLONASE) 50 MCG/ACT nasal spray, Place into both nostrils., Disp: , Rfl:    hydrocortisone 2.5 % cream, Apply 1-2 times daily for up to 1 week to affected areas of rash., Disp: 30 g, Rfl: 1  ibuprofen (ADVIL) 200 MG tablet, Take 400-600 mg by mouth every 8 (eight) hours as needed for moderate pain., Disp: , Rfl:    ketoconazole (NIZORAL) 2 % cream, Apply twice daily to affected areas on face., Disp: 30 g, Rfl: 2   levocetirizine (XYZAL) 5 MG tablet, Take 1 tablet (5 mg total) by mouth every evening., Disp: 90 tablet, Rfl: 1   montelukast (SINGULAIR) 10 MG tablet, Take 1 tablet (10 mg total) by mouth at bedtime., Disp: 90 tablet, Rfl: 1   olmesartan (BENICAR) 40 MG tablet, Take 1 tablet (40 mg total) by mouth daily., Disp: 90 tablet, Rfl: 1   omeprazole (PRILOSEC) 20 MG capsule, Take 1 capsule (20 mg total) by mouth every morning., Disp: 90 capsule, Rfl: 1   Oxymetazoline HCl (RHOFADE) 1 % CREA, Apply once daily in the morning., Disp: 30 g, Rfl: 2   Roflumilast (ZORYVE) 0.3 % CREA, Apply to affected areas face once daily until improved., Disp: 60 g, Rfl: 1   temazepam (RESTORIL) 7.5 MG capsule, Take 1 capsule (7.5 mg total) by mouth at bedtime., Disp: 90 capsule, Rfl: 0  Observations/Objective: Patient is well-developed, well-nourished in no acute distress.  Resting comfortably  at home.   Head is normocephalic, atraumatic.  No labored breathing.  Speech is clear and coherent with logical content.  Patient is alert and oriented at baseline.    Assessment and Plan: 1. COVID  Increase fluids, ED if sx worsen. Zofran 8 mg dose sent due to promethazine allergy.   Follow Up Instructions: I discussed the assessment and treatment plan with the patient. The patient was provided an opportunity to ask questions and all were answered. The patient agreed with the plan and demonstrated an understanding of the instructions.  A copy of instructions were sent to the patient via MyChart unless otherwise noted below.     The patient was advised to call back or seek an in-person evaluation if the symptoms worsen or if the condition fails to improve as anticipated.  Time:  I spent 10 minutes with the patient via telehealth technology discussing the above problems/concerns.    Yolanda Curio, FNP

## 2023-06-21 ENCOUNTER — Encounter: Payer: BC Managed Care – PPO | Admitting: Dermatology

## 2023-06-24 NOTE — Progress Notes (Unsigned)
Name: Yolanda Pace   MRN: [id number]    DOB: August 24, 1966   Date:06/24/2023       Progress Note  Subjective  Chief Complaint  Follow Up  HPI  MDD recurrent : we adjusted Duloxetine from 20 mg to 30 mg, and she is was doing better, OCD symptoms had improved and mood was better, she is feeling more depressed, we gave her Wellbutrin Fall 2023 but she never started due to fear of side effects . Phq 9 is still  high She is sleeping better with Temazepam that she takes prn only ( Hydroxyzine caused her to feel groggy and Trazodone did not work), she found a Librarian, academic and she scheduled her first visit to August 2024, she as a lot of health anxiety and is afraid of getting a colonoscopy and finding out she has cancer.  Dizziness and abnormal Brain MRI: seen by ENT and saw neurologist had vestibular rehab and is feeling better now   IMPRESSION: 1. No internal auditory canal or cerebellopontine angle mass lesion or focus of abnormal contrast enhancement. 2. 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 Mass effect is noted on the anterior left temporal lobe. 3. Minimal amount of nonspecific T2 hyperintense lesions of the white matter, may represent early chronic microangiopathy  FMS/elevated CRP/Connective Tissue Disease : she was seen by Dr. Gareth Morgan in the past She states x-rays were done and also labs , inflammatory markers improved and she was given reassurance that is it OA and to continue to monitor.  Takes Duloxetine  30 mg She has daily pain, she also has multiple areas of OA and worse pain has been on her thumbs, she had more injection by ortho at [facility] - Dr. Landry Mellow and it help. Unchanged   DM in obese: glucose in urine, A1C was  6.4% , 6.1 % it went up to 7.5 % 5.9 % 5.5% down to 5.3 %, it is now stable at 6.3 %  discussed starting therapy but she would like to get depression under  control first.  . Discussed importance of statin therapy but it caused her muscle pain in the past , gave her zetia Fall 2023 and tolerating medication well . We will recheck labs today. She takes ARB   GERD: controlled with medication, denies heartburn or indigestion . Taking Omeprazole and symptoms are controlled   Mild intermittent asthma: seeing Dr. Welton Flakes and is off steroid inhaler, uses rescue and also on singulair  Doing well at this time    Chronic back pain with radiculitis: had lumbar laminectomy done by Dr. Adriana Simas June 2022 currently taking low dose duloxetine  No bowel or bladder incontinence. She is under the care of Dr. 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   Major depression, recurrent, chronic (HCC) 04/08/2022   Symptomatic varicose veins, left 03/19/2022   Palpitations 07/13/2021   Diabetes mellitus with microalbuminuria (HCC) 02/23/2021   Fibromyalgia 02/20/2021   Connective tissue disease (HCC) 10/31/2020   Chronic shoulder pain 07/07/2020   Mild intermittent asthma without complication 07/07/2020   Traumatic incomplete tear of left rotator cuff 04/23/2020   Arthralgia 08/03/2019   Gastric polyp    Benign neoplasm of transverse colon    Internal hemorrhoids  Malignant neoplasm of skin 06/19/2018   Chronic pain syndrome 06/19/2018   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   Kidney stone 02/20/2018   Renal lesion 02/20/2018   Benign hypertension 01/21/2018   Pelvic pain 12/26/2017   GERD (gastroesophageal reflux disease) 11/09/2017   Elevated beta-2 microglobulin 07/11/2017   Right thyroid nodule 05/26/2017   Mild hypercholesterolemia 05/06/2017   Generalized osteoarthritis of hand 12/13/2016   Impingement syndrome of shoulder region, left 11/29/2016   Anxiety     Allergic rhinitis    Vitamin D deficiency disease     Past Surgical History:  Procedure Laterality Date   ABDOMINAL HYSTERECTOMY  2005ish   fibroids, ovaries remain; along with bladder tack   BACK SURGERY     Lower back   bladder sling mesh removal  08/2018   bladder tack  2005ish   CHOLECYSTECTOMY     COLONOSCOPY WITH PROPOFOL N/A 06/22/2018   Procedure: COLONOSCOPY WITH PROPOFOL;  Surgeon: Pasty Spillers, MD;  Location: [facility];  Service: Endoscopy;  Laterality: N/A;   CYSTO WITH HYDRODISTENSION N/A 05/02/2018   Procedure: CYSTOSCOPY/HYDRODISTENSION;  Surgeon: Orson Ape, MD;  Location: [facility];  Service: Urology;  Laterality: N/A;   ESOPHAGOGASTRODUODENOSCOPY (EGD) WITH PROPOFOL N/A 06/22/2018   Procedure: ESOPHAGOGASTRODUODENOSCOPY (EGD) WITH PROPOFOL;  Surgeon: Pasty Spillers, MD;  Location: [facility];  Service: Endoscopy;  Laterality: N/A;   HEMI-MICRODISCECTOMY LUMBAR LAMINECTOMY LEVEL 1 Left 04/27/2021   Procedure: LEFT L4-5 HEMILAMINECTOMY;  Surgeon: Lucy Chris, MD;  Location: [facility] ORS;  Service: Neurosurgery;  Laterality: Left;   INCONTINENCE SURGERY     ROTATOR CUFF REPAIR Left 01/08/2019   rotator cuff revision Left 12/06/2019   SKIN CANCER EXCISION  06/2014   removed from back    Family History  Problem Relation Age of Onset   Hyperlipidemia Mother    Diabetes Sister    Hyperlipidemia Sister    Hypertension Sister    Hypertension Brother    Hypertension Son    Kidney Stones Son    Pneumonia Maternal Grandmother    Heart attack Maternal Grandfather    Hypertension Sister    Epilepsy Sister    Hypertension Sister    Hypertension Daughter    Kidney Stones Daughter    Cancer Neg Hx    COPD Neg Hx    Stroke Neg Hx     Social History   Tobacco Use   Smoking status: Never   Smokeless tobacco: Never  Substance Use Topics   Alcohol use: Yes    Comment: occ     Current Outpatient Medications:    acetaminophen (TYLENOL)  500 MG tablet, Take 1,000 mg by mouth every 8 (eight) hours as needed for moderate pain., Disp: , Rfl:    albuterol (PROVENTIL) (2.5 MG/3ML) 0.083% nebulizer solution, Take 3 mLs (2.5 mg total) by nebulization every 4 (four) hours as needed for wheezing or shortness of breath., Disp: 75 mL, Rfl: 1   albuterol (VENTOLIN HFA) 108 (90 Base) MCG/ACT inhaler, Inhale 2 puffs into the lungs 4 (four) times daily as needed for shortness of breath or wheezing., Disp: , Rfl:    blood glucose meter kit and supplies, Dispense based on patient and insurance preference. Use up to four times daily as directed. (FOR ICD-10 E10.9, E11.9)., Disp: 1 each, Rfl: 0   budesonide-formoterol (SYMBICORT) 160-4.5 MCG/ACT inhaler, Inhale 2 puffs into the lungs 2 (two) times daily., Disp: 1 each, Rfl: 3   Cholecalciferol (VITAMIN D) 2000 units tablet, Take  2,000 Units by mouth daily. , Disp: , Rfl:    Crisaborole (EUCRISA) 2 % OINT, Apply twice daily to affected areas on face, Disp: 60 g, Rfl: 1   DULoxetine (CYMBALTA) 30 MG capsule, Take 1 capsule (30 mg total) by mouth daily., Disp: 90 capsule, Rfl: 0   ezetimibe (ZETIA) 10 MG tablet, Take 1 tablet (10 mg total) by mouth daily., Disp: 90 tablet, Rfl: 3   fluticasone (FLONASE) 50 MCG/ACT nasal spray, Place into both nostrils., Disp: , Rfl:    hydrocortisone 2.5 % cream, Apply 1-2 times daily for up to 1 week to affected areas of rash., Disp: 30 g, Rfl: 1   ibuprofen (ADVIL) 200 MG tablet, Take 400-600 mg by mouth every 8 (eight) hours as needed for moderate pain., Disp: , Rfl:    ketoconazole (NIZORAL) 2 % cream, Apply twice daily to affected areas on face., Disp: 30 g, Rfl: 2   levocetirizine (XYZAL) 5 MG tablet, Take 1 tablet (5 mg total) by mouth every evening., Disp: 90 tablet, Rfl: 1   montelukast (SINGULAIR) 10 MG tablet, Take 1 tablet (10 mg total) by mouth at bedtime., Disp: 90 tablet, Rfl: 1   olmesartan (BENICAR) 40 MG tablet, Take 1 tablet (40 mg total) by mouth  daily., Disp: 90 tablet, Rfl: 1   omeprazole (PRILOSEC) 20 MG capsule, Take 1 capsule (20 mg total) by mouth every morning., Disp: 90 capsule, Rfl: 1   Oxymetazoline HCl (RHOFADE) 1 % CREA, Apply once daily in the morning., Disp: 30 g, Rfl: 2   Roflumilast (ZORYVE) 0.3 % CREA, Apply to affected areas face once daily until improved., Disp: 60 g, Rfl: 1   temazepam (RESTORIL) 7.5 MG capsule, Take 1 capsule (7.5 mg total) by mouth at bedtime., Disp: 90 capsule, Rfl: 0  Allergies  Allergen Reactions   Betadine [Povidone Iodine] Swelling, Dermatitis and Rash    Pt states site gets infected   Metrizamide Swelling    Noted in [facility] chart on 01/06/2012. Premedicate patient prior to contrast media injections.   Other     Mescalor: vomiting    Salicylic Acid-Sulfur     Burst eye vessels   Bee Pollen     Sneezing, watery eyes, coughing.    Meloxicam Itching   Pollen Extract Other (See Comments)    Sneezing, watery eyes, coughing.    Sulindac Anxiety and Other (See Comments)   Compazine  [Prochlorperazine] Other (See Comments)    Jaws locked up   Contrast Media [Iodinated Contrast Media] Swelling    Noted in [facility] chart on 01/06/2012. Premedicate patient prior to contrast media injections.   Doxycycline Nausea Only    GI upset   Elemental Sulfur Other (See Comments)    Made all blood vessels in eyes burst   Gabapentin Nausea And Vomiting    Lip numbness    Keppra [Levetiracetam]     Pt was not herself    Lyrica [Pregabalin]     Tingling, depression, blister mouth    Rosuvastatin     Mental fogginess, confusion, fatigue    Codeine Nausea And Vomiting   Hydroxychloroquine Nausea And Vomiting and Nausea Only    Ear clicking   Iodine Swelling   Sulfa Antibiotics Nausea And Vomiting and Other (See Comments)    I personally reviewed active problem list, medication list, allergies, family history, social history, health maintenance with the patient/caregiver  today.   ROS  ***  Objective  There were no vitals filed for  this visit.  There is no height or weight on file to calculate BMI.  Physical Exam ***   PHQ2/9:    03/28/2023    8:43 AM 12/27/2022    9:10 AM 08/24/2022    8:48 AM 05/10/2022    3:14 PM 04/08/2022   10:30 AM  Depression screen PHQ 2/9  Decreased Interest 2 2 2 1 1   Down, Depressed, Hopeless 2 2 2 2 1   PHQ - 2 Score 4 4 4 3 2   Altered sleeping 3 2 1 2 3   Tired, decreased energy 3 1 1 2 3   Change in appetite 1 3 3  0 2  Feeling bad or failure about yourself  2 2 1  0 2  Trouble concentrating 2 3 3 3 3   Moving slowly or fidgety/restless 0 0 0 0 0  Suicidal thoughts 2 2 2  0 0  PHQ-9 Score 17 17 15 10 15   Difficult doing work/chores   Very difficult Somewhat difficult     phq 9 is {gen pos [id number]}   Fall Risk:    03/28/2023    8:43 AM 12/27/2022    9:09 AM 08/24/2022    8:44 AM 05/10/2022    3:07 PM 04/08/2022   10:30 AM  Fall Risk   Falls in the past year? 0 0 0 0 0  Number falls in past yr: 0 0  0 0  Injury with Fall? 0 0  0 0  Risk for fall due to : No Fall Risks No Fall Risks No Fall Risks No Fall Risks No Fall Risks  Follow up Falls prevention discussed Falls prevention discussed Falls prevention discussed Falls prevention discussed;Education provided Falls prevention discussed      Functional Status Survey:      Assessment & Plan  *** There are no diagnoses linked to this encounter.

## 2023-06-27 ENCOUNTER — Ambulatory Visit: Payer: BC Managed Care – PPO | Admitting: Family Medicine

## 2023-06-27 ENCOUNTER — Encounter: Payer: Self-pay | Admitting: Family Medicine

## 2023-06-27 VITALS — BP 132/80 | HR 86 | Resp 16 | Ht 63.0 in | Wt 184.0 lb

## 2023-06-27 DIAGNOSIS — K219 Gastro-esophageal reflux disease without esophagitis: Secondary | ICD-10-CM | POA: Diagnosis not present

## 2023-06-27 DIAGNOSIS — J302 Other seasonal allergic rhinitis: Secondary | ICD-10-CM

## 2023-06-27 DIAGNOSIS — F331 Major depressive disorder, recurrent, moderate: Secondary | ICD-10-CM | POA: Diagnosis not present

## 2023-06-27 DIAGNOSIS — E785 Hyperlipidemia, unspecified: Secondary | ICD-10-CM

## 2023-06-27 DIAGNOSIS — F339 Major depressive disorder, recurrent, unspecified: Secondary | ICD-10-CM

## 2023-06-27 DIAGNOSIS — Z8616 Personal history of COVID-19: Secondary | ICD-10-CM

## 2023-06-27 DIAGNOSIS — J4520 Mild intermittent asthma, uncomplicated: Secondary | ICD-10-CM

## 2023-06-27 DIAGNOSIS — M359 Systemic involvement of connective tissue, unspecified: Secondary | ICD-10-CM | POA: Diagnosis not present

## 2023-06-27 DIAGNOSIS — E1169 Type 2 diabetes mellitus with other specified complication: Secondary | ICD-10-CM

## 2023-06-27 DIAGNOSIS — I10 Essential (primary) hypertension: Secondary | ICD-10-CM

## 2023-06-27 DIAGNOSIS — M797 Fibromyalgia: Secondary | ICD-10-CM

## 2023-06-27 DIAGNOSIS — J3089 Other allergic rhinitis: Secondary | ICD-10-CM

## 2023-06-27 LAB — POCT GLYCOSYLATED HEMOGLOBIN (HGB A1C): Hemoglobin A1C: 6.7 % — AB (ref 4.0–5.6)

## 2023-06-27 MED ORDER — MONTELUKAST SODIUM 10 MG PO TABS: 10.0000 mg | ORAL_TABLET | Freq: Every day | ORAL | 1 refills | Status: DC

## 2023-06-27 MED ORDER — OMEPRAZOLE 20 MG PO CPDR: 20.0000 mg | DELAYED_RELEASE_CAPSULE | Freq: Every morning | ORAL | 1 refills | Status: DC

## 2023-06-27 MED ORDER — DULOXETINE HCL 30 MG PO CPEP
30.0000 mg | ORAL_CAPSULE | Freq: Every day | ORAL | 1 refills | Status: DC
Start: 2023-06-27 — End: 2024-01-24

## 2023-06-27 MED ORDER — LEVOCETIRIZINE DIHYDROCHLORIDE 5 MG PO TABS: 5.0000 mg | ORAL_TABLET | Freq: Every evening | ORAL | 1 refills | Status: DC

## 2023-06-27 MED ORDER — OLMESARTAN MEDOXOMIL 40 MG PO TABS: 40.0000 mg | ORAL_TABLET | Freq: Every day | ORAL | 1 refills | Status: DC

## 2023-07-25 ENCOUNTER — Encounter: Payer: Self-pay | Admitting: Dietician

## 2023-07-25 ENCOUNTER — Encounter: Payer: BC Managed Care – PPO | Attending: Family Medicine | Admitting: Dietician

## 2023-07-25 DIAGNOSIS — E1169 Type 2 diabetes mellitus with other specified complication: Secondary | ICD-10-CM | POA: Diagnosis present

## 2023-07-25 DIAGNOSIS — E785 Hyperlipidemia, unspecified: Secondary | ICD-10-CM | POA: Insufficient documentation

## 2023-07-25 DIAGNOSIS — Z713 Dietary counseling and surveillance: Secondary | ICD-10-CM | POA: Diagnosis not present

## 2023-07-25 DIAGNOSIS — R809 Proteinuria, unspecified: Secondary | ICD-10-CM

## 2023-07-25 NOTE — Patient Instructions (Addendum)
Check your blood sugar each morning before eating or drinking (fasting). Look for numbers between under 130 mg/dL Check your blood sugar 2 hours after you begin eating a meal. Look for numbers under 180 mg/dL at all times.  Your goal A1c is below 7.0%  Work towards eating three meals a day, about 5-6 hours apart!  Begin to recognize carbohydrates, proteins, and non-starchy vegetables in your food choices!  Begin to build your meals using the proportions of the Balanced Plate. First, select your carb choice(s) for the meal. Make this 25% of your meal. Next, select your source of protein to pair with your carb choice(s). Make this another 25% of your meal. Choose lean or lowfat proteins! Finally, complete your meal with a variety of non-starchy vegetables. Make this the remaining 50% of your meal.

## 2023-07-25 NOTE — Progress Notes (Signed)
Diabetes Self-Management Education  Visit Type: First/Initial  Appt. Start Time: 0930 Appt. End Time: 1050  07/25/2023  [name], identified by name and date of birth, is a 57 y.o. female with a diagnosis of Diabetes: Type 2.   ASSESSMENT  There were no vitals taken for this visit. There is no height or weight on file to calculate BMI.  Pt reports history of dealing with a lot of joint pain, started Cymbalta and noticed elevations in glucose, blood pressure, cholesterol, pt reports having severe depression with SI when discontinuing Cymbalta, is back on it but has occasional bouts of mild depression. Pt reports fear of medication since bout of severe depression. Pt reports trying to work with a psychologist but insurance will not cover it. Pt reports eating poorly on days of depression.  Pt reports trying to get away from soda, states diet sodas upset their stomach. Pt reports dealing with reflux somewhat regularly, taking Omeprazole. Pt reports fall in August that is causing some lingering neck and knee pain, currently doing PT for neck. Pt reports lower back pain and shoulder pain that inhibit physical activity as well.   Diabetes Self-Management Education - 07/25/23 0949       Visit Information   Visit Type First/Initial      Initial Visit   Diabetes Type Type 2    Date Diagnosed 06/2023      Health Coping   How would you rate your overall health? Fair      Psychosocial Assessment   Patient Belief/Attitude about Diabetes Other (comment)   Motivated, but feels burnout a/w depression   What is the hardest part about your diabetes right now, causing you the most concern, or is the most worrisome to you about your diabetes?   Other (comment)   Depression   Self-care barriers Debilitated state due to current medical condition    Self-management support Support group;Friends;Family   Daughter   Other persons present Patient    Patient Concerns Problem Solving    Special Needs  None    Preferred Learning Style No preference indicated    Learning Readiness Contemplating    How often do you need to have someone help you when you read instructions, pamphlets, or other written materials from your doctor or pharmacy? 3 - Sometimes    What is the last grade level you completed in school? Some college      Pre-Education Assessment   Patient understands the diabetes disease and treatment process. Needs Instruction    Patient understands incorporating nutritional management into lifestyle. Needs Instruction    Patient undertands incorporating physical activity into lifestyle. Needs Instruction    Patient understands using medications safely. Needs Instruction    Patient understands monitoring blood glucose, interpreting and using results Needs Instruction    Patient understands prevention, detection, and treatment of acute complications. Needs Instruction    Patient understands prevention, detection, and treatment of chronic complications. Needs Instruction    Patient understands how to develop strategies to address psychosocial issues. Needs Instruction    Patient understands how to develop strategies to promote health/change behavior. Needs Instruction      Complications   Last HgB A1C per patient/outside source 6.7 %   06/27/2023   Have you had a dilated eye exam in the past 12 months? Yes    Have you had a dental exam in the past 12 months? Yes    Are you checking your feet? Yes    How many days  per week are you checking your feet? 4      Dietary Intake   Breakfast Oatmeal w/ dates, walnuts    Lunch Mexican lettuce bowl w/ steak, corn, beans, rice, cheese, tortilla chips, Half unsweet tea    Dinner Small serving of steak, and potatoes, water    Beverage(s) water, half unsweet tea      Activity / Exercise   Activity / Exercise Type ADL's   Back/nerve pain inhibits physical activity   How many days per week do you exercise? 0    How many minutes per day do you  exercise? 0    Total minutes per week of exercise 0      Patient Education   Previous Diabetes Education No    Disease Pathophysiology Factors that contribute to the development of diabetes;Explored patient's options for treatment of their diabetes    Healthy Eating Role of diet in the treatment of diabetes and the relationship between the three main macronutrients and blood glucose level    Monitoring Identified appropriate SMBG and/or A1C goals.    Chronic complications Relationship between chronic complications and blood glucose control;Identified and discussed with patient  current chronic complications;Lipid levels, blood glucose control and heart disease    Diabetes Stress and Support Identified and addressed patients feelings and concerns about diabetes;Worked with patient to identify barriers to care and solutions;Role of stress on diabetes;Brainstormed with patient on coping mechanisms for social situations, getting support from significant others, dealing with feelings about diabetes    Lifestyle and Health Coping Helped patient develop diabetes management plan for (enter comment)      Individualized Goals (developed by patient)   Nutrition Follow meal plan discussed;General guidelines for healthy choices and portions discussed    Medications Not Applicable    Monitoring  Test my blood glucose as discussed    Problem Solving Addressing barriers to behavior change    Reducing Risk examine blood glucose patterns    Health Coping Ask for help with psychological, social, or emotional issues;Discuss barriers to diabetes care with support person/system (comment specifics as needed)      Post-Education Assessment   Patient understands the diabetes disease and treatment process. Comprehends key points    Patient understands incorporating nutritional management into lifestyle. Comprehends key points    Patient undertands incorporating physical activity into lifestyle. Comprehends key points     Patient understands using medications safely. N/A    Patient understands monitoring blood glucose, interpreting and using results Comprehends key points    Patient understands prevention, detection, and treatment of acute complications. N/A    Patient understands prevention, detection, and treatment of chronic complications. Comprehends key points    Patient understands how to develop strategies to address psychosocial issues. Comprehends key points    Patient understands how to develop strategies to promote health/change behavior. Comprehends key points      Outcomes   Expected Outcomes Demonstrated interest in learning but significant barriers to change   Depressive moods   Future DMSE 4-6 wks;2 months    Program Status Not Completed             Individualized Plan for Diabetes Self-Management Training:   Learning Objective:  Patient will have a greater understanding of diabetes self-management. Patient education plan is to attend individual and/or group sessions per assessed needs and concerns.   Plan:   Patient Instructions  Check your blood sugar each morning before eating or drinking (fasting). Look for numbers between under 130 mg/dL  Check your blood sugar 2 hours after you begin eating a meal. Look for numbers under 180 mg/dL at all times.  Your goal A1c is below 7.0%  Work towards eating three meals a day, about 5-6 hours apart!  Begin to recognize carbohydrates, proteins, and non-starchy vegetables in your food choices!  Begin to build your meals using the proportions of the Balanced Plate. First, select your carb choice(s) for the meal. Make this 25% of your meal. Next, select your source of protein to pair with your carb choice(s). Make this another 25% of your meal. Choose lean or lowfat proteins! Finally, complete your meal with a variety of non-starchy vegetables. Make this the remaining 50% of your meal.   Expected Outcomes:  Demonstrated interest in  learning but significant barriers to change (Depressive moods)  Education material provided: My Plate  If problems or questions, patient to contact team via:  Phone and Email  Future DSME appointment: 4-6 wks, 2 months

## 2023-07-27 ENCOUNTER — Ambulatory Visit: Payer: BC Managed Care – PPO | Admitting: Dermatology

## 2023-07-27 VITALS — BP 147/88 | HR 81

## 2023-07-27 DIAGNOSIS — L853 Xerosis cutis: Secondary | ICD-10-CM

## 2023-07-27 DIAGNOSIS — L814 Other melanin hyperpigmentation: Secondary | ICD-10-CM

## 2023-07-27 DIAGNOSIS — W908XXA Exposure to other nonionizing radiation, initial encounter: Secondary | ICD-10-CM | POA: Diagnosis not present

## 2023-07-27 DIAGNOSIS — D1801 Hemangioma of skin and subcutaneous tissue: Secondary | ICD-10-CM

## 2023-07-27 DIAGNOSIS — L578 Other skin changes due to chronic exposure to nonionizing radiation: Secondary | ICD-10-CM

## 2023-07-27 DIAGNOSIS — L2089 Other atopic dermatitis: Secondary | ICD-10-CM

## 2023-07-27 DIAGNOSIS — D225 Melanocytic nevi of trunk: Secondary | ICD-10-CM

## 2023-07-27 DIAGNOSIS — L719 Rosacea, unspecified: Secondary | ICD-10-CM

## 2023-07-27 DIAGNOSIS — L209 Atopic dermatitis, unspecified: Secondary | ICD-10-CM

## 2023-07-27 DIAGNOSIS — L821 Other seborrheic keratosis: Secondary | ICD-10-CM

## 2023-07-27 DIAGNOSIS — Z1283 Encounter for screening for malignant neoplasm of skin: Secondary | ICD-10-CM

## 2023-07-27 DIAGNOSIS — D229 Melanocytic nevi, unspecified: Secondary | ICD-10-CM

## 2023-07-27 DIAGNOSIS — Z85828 Personal history of other malignant neoplasm of skin: Secondary | ICD-10-CM

## 2023-07-27 DIAGNOSIS — Z7189 Other specified counseling: Secondary | ICD-10-CM

## 2023-07-27 DIAGNOSIS — D2271 Melanocytic nevi of right lower limb, including hip: Secondary | ICD-10-CM

## 2023-07-27 DIAGNOSIS — Z86018 Personal history of other benign neoplasm: Secondary | ICD-10-CM

## 2023-07-27 MED ORDER — OPZELURA 1.5 % EX CREA: TOPICAL_CREAM | CUTANEOUS | 1 refills | Status: DC

## 2023-07-27 MED ORDER — IVERMECTIN 1 % EX CREA: TOPICAL_CREAM | CUTANEOUS | 11 refills | Status: AC

## 2023-07-27 NOTE — Progress Notes (Signed)
Follow-Up Visit   Subjective  Yolanda Pace is a 57 y.o. female who presents for the following: Skin Cancer Screening and Full Body Skin Exam  The patient presents for Total-Body Skin Exam (TBSE) for skin cancer screening and mole check. The patient has spots, moles and lesions to be evaluated, some may be new or changing and the patient may have concern these could be cancer.  The following portions of the chart were reviewed this encounter and updated as appropriate: medications, allergies, medical history  Review of Systems:  No other skin or systemic complaints except as noted in HPI or Assessment and Plan.  Objective  Well appearing patient in no apparent distress; mood and affect are within normal limits.  A full examination was performed including scalp, head, eyes, ears, nose, lips, neck, chest, axillae, abdomen, back, buttocks, bilateral upper extremities, bilateral lower extremities, hands, feet, fingers, toes, fingernails, and toenails. All findings within normal limits unless otherwise noted below.   Relevant physical exam findings are noted in the Assessment and Plan.    Assessment & Plan   SKIN CANCER SCREENING PERFORMED TODAY.  ACTINIC DAMAGE - Chronic condition, secondary to cumulative UV/sun exposure - diffuse scaly erythematous macules with underlying dyspigmentation - Recommend daily broad spectrum sunscreen SPF 30+ to sun-exposed areas, reapply every 2 hours as needed.  - Staying in the shade or wearing long sleeves, sun glasses (UVA+UVB protection) and wide brim hats (4-inch brim around the entire circumference of the hat) are also recommended for sun protection.  - Call for new or changing lesions.  LENTIGINES, SEBORRHEIC KERATOSES, HEMANGIOMAS - Benign normal skin lesions - Benign-appearing - Call for any changes  MELANOCYTIC NEVI 5.0 mm medium brown macule, darker edge at right buttock, stable compared to previous TBSE 8.0 x 5.0 mm medium brown macule at  right upper anterior thigh, stable compared to previous TBSE - Tan-brown and/or pink-flesh-colored symmetric macules and papules - Benign appearing on exam today - Observation - Call clinic for new or changing moles - Recommend daily use of broad spectrum spf 30+ sunscreen to sun-exposed areas.   HISTORY OF BASAL CELL CARCINOMA OF THE SKIN - No evidence of recurrence today - Recommend regular full body skin exams - Recommend daily broad spectrum sunscreen SPF 30+ to sun-exposed areas, reapply every 2 hours as needed.  - Call if any new or changing lesions are noted between office visits  HISTORY OF SQUAMOUS CELL CARCINOMA OF THE SKIN - No evidence of recurrence today - Recommend regular full body skin exams - Recommend daily broad spectrum sunscreen SPF 30+ to sun-exposed areas, reapply every 2 hours as needed.  - Call if any new or changing lesions are noted between office visits  HISTORY OF DYSPLASTIC NEVUS No evidence of recurrence today Recommend regular full body skin exams Recommend daily broad spectrum sunscreen SPF 30+ to sun-exposed areas, reapply every 2 hours as needed.  Call if any new or changing lesions are noted between office visits  ATOPIC DERMATITIS Exam: Pink scaly patches on the cheeks, eyelids, and eyebrows  1% BSA  Chronic and persistent condition with duration or expected duration over one year. Condition is bothersome/symptomatic for patient. Currently flared.  Unable to tolerate Eucrisa due to burning.  HC 2.5% cream did not work.   Atopic dermatitis (eczema) is a chronic, relapsing, pruritic condition that can significantly affect quality of life. It is often associated with allergic rhinitis and/or asthma and can require treatment with topical medications, phototherapy, or in  severe cases biologic injectable medication (Dupixent; Adbry) or Oral JAK inhibitors.  Treatment Plan: Start Opzelura cream to aa's every day/bid PRN. Patient has tried and failed  Eucrisa and HC 2.5% cream. If not covered will send in Elidel cream.  Recommend gentle skin care.  ROSACEA Exam Mid face erythema with telangiectasias +/- scattered inflammatory papules on the R cheek  Chronic and persistent condition with duration or expected duration over one year. Condition is bothersome/symptomatic for patient. Currently flared.  Rosacea is a chronic progressive skin condition usually affecting the face of adults, causing redness and/or acne bumps. It is treatable but not curable. It sometimes affects the eyes (ocular rosacea) as well. It may respond to topical and/or systemic medication and can flare with stress, sun exposure, alcohol, exercise, topical steroids (including hydrocortisone/cortisone 10) and some foods.  Daily application of broad spectrum spf 30+ sunscreen to face is recommended to reduce flares.  Treatment Plan: Counseling for BBL / IPL / Laser and Coordination of Care Discussed the treatment option of Broad Band Light (BBL) /Intense Pulsed Light (IPL)/ Laser for skin discoloration, including brown spots and redness.  Typically we recommend at least 1-3 treatment sessions about 5-8 weeks apart for best results.  Cannot have tanned skin when BBL performed, and regular use of sunscreen/photoprotection is advised after the procedure to help maintain results. The patient's condition may also require "maintenance treatments" in the future.  The fee for BBL / laser treatments is $350 per treatment session for the whole face.  A fee can be quoted for other parts of the body.  Insurance typically does not pay for BBL/laser treatments and therefore the fee is an out-of-pocket cost. Recommend prophylactic valtrex treatment. Once scheduled for procedure, will send Rx in prior to patient's appointment.   Start SM50 Oxymetazoline 1% / Ivermectin 1% / Niacinamide 2% Cream apply QAM.  Xerosis - diffuse xerotic patches feet - recommend gentle, hydrating skin care - gentle  skin care handout given Recommend starting moisturizer with exfoliant (Urea, Salicylic acid, or Lactic acid) one to two times daily to help smooth rough and bumpy skin.  OTC options include Cetaphil Rough and Bumpy lotion (Urea), Eucerin Roughness Relief lotion or spot treatment cream (Urea), CeraVe SA lotion/cream for Rough and Bumpy skin (Sal Acid), Gold Bond Rough and Bumpy cream (Sal Acid), and AmLactin 12% lotion/cream (Lactic Acid).  If applying in morning, also apply sunscreen to sun-exposed areas, since these exfoliating moisturizers can increase sensitivity to sun.  LENTIGINES 2.0 medium dark brown macule of the L lower lip, stable compared to photo from 06/09/2021 Exam: scattered tan macules Due to sun exposure Treatment Plan: Benign-appearing, observe. Recommend daily broad spectrum sunscreen SPF 30+ to sun-exposed areas, reapply every 2 hours as needed.  Call for any changes  Return in about 1 year (around 07/26/2024) for TBSE.  Maylene Roes, CMA, am acting as scribe for Willeen Niece, MD .  Documentation: I have reviewed the above documentation for accuracy and completeness, and I agree with the above.  Willeen Niece, MD

## 2023-07-27 NOTE — Patient Instructions (Addendum)
Instructions for [facility] Medications  One or more of your medications was sent to the [facility] mail order compounding pharmacy. You will receive an email from them and can purchase the medicine through that link. It will then be mailed to your home at the address you confirmed. If for any reason you do not receive an email from them, please check your spam folder. If you still do not find the email, please let us know. [facility] phone number is [phone].  Recommend starting moisturizer with exfoliant (Urea, Salicylic acid, or Lactic acid) one to two times daily to help smooth rough and bumpy skin.  OTC options include Cetaphil Rough and Bumpy lotion (Urea), Eucerin Roughness Relief lotion or spot treatment cream (Urea), CeraVe SA lotion/cream for Rough and Bumpy skin (Sal Acid), Gold Bond Rough and Bumpy cream (Sal Acid), and AmLactin 12% lotion/cream (Lactic Acid).  If applying in morning, also apply sunscreen to sun-exposed areas, since these exfoliating moisturizers can increase sensitivity to sun.   Due to recent changes in healthcare laws, you may see results of your pathology and/or laboratory studies on MyChart before the doctors have had a chance to review them. We understand that in some cases there may be results that are confusing or concerning to you. Please understand that not all results are received at the same time and often the doctors may need to interpret multiple results in order to provide you with the best plan of care or course of treatment. Therefore, we ask that you please give [place] 2 business days to thoroughly review all your results before contacting the office for clarification. Should we see a critical lab result, you will be contacted sooner.   If You Need Anything After Your Visit  If you have any questions or concerns for your doctor, please call our main line at [phone] and press option 4 to reach your doctor's medical assistant. If no one  answers, please leave a voicemail as directed and we will return your call as soon as possible. Messages left after 4 pm will be answered the following business day.   You may also send [place] a message via MyChart. We typically respond to MyChart messages within 1-2 business days.  For prescription refills, please ask your pharmacy to contact our office. Our fax number is [phone].  If you have an urgent issue when the clinic is closed that cannot wait until the next business day, you can page your doctor at the number below.    Please note that while we do our best to be available for urgent issues outside of office hours, we are not available 24/7.   If you have an urgent issue and are unable to reach [place], you may choose to seek medical care at your doctor's office, retail clinic, urgent care center, or emergency room.  If you have a medical emergency, please immediately call 911 or go to the emergency department.  Pager Numbers  - Dr. Gwen Pounds: [phone]  - Dr. Roseanne Reno: [phone]  - Dr. Katrinka Blazing: [phone]   In the event of inclement weather, please call our main line at [phone] for an update on the status of any delays or closures.  Dermatology Medication Tips: Please keep the boxes that topical medications come in in order to help keep track of the instructions about where and how to use these. Pharmacies typically print the medication instructions only on the boxes and not directly on the medication tubes.   If your medication  is too expensive, please contact our office at [phone] option 4 or send [place] a message through MyChart.   We are unable to tell what your co-pay for medications will be in advance as this is different depending on your insurance coverage. However, we may be able to find a substitute medication at lower cost or fill out paperwork to get insurance to cover a needed medication.   If a prior authorization is required to get your medication covered  by your insurance company, please allow [place] 1-2 business days to complete this process.  Drug prices often vary depending on where the prescription is filled and some pharmacies may offer cheaper prices.  The website www.goodrx.com contains coupons for medications through different pharmacies. The prices here do not account for what the cost may be with help from insurance (it may be cheaper with your insurance), but the website can give you the price if you did not use any insurance.  - You can print the associated coupon and take it with your prescription to the pharmacy.  - You may also stop by our office during regular business hours and pick up a GoodRx coupon card.  - If you need your prescription sent electronically to a different pharmacy, notify our office through [facility] or by phone at [phone] option 4.     Si Usted Necesita Algo Despus de Su Visita  Tambin puede enviarnos un mensaje a travs de Clinical cytogeneticist. Por lo general respondemos a los mensajes de MyChart en el transcurso de 1 a 2 das hbiles.  Para renovar recetas, por favor pida a su farmacia que se ponga en contacto con nuestra oficina. Annie Sable de fax es [place] [phone].  Si tiene un asunto urgente cuando la clnica est cerrada y que no puede esperar hasta el siguiente da hbil, puede llamar/localizar a su doctor(a) al nmero que aparece a continuacin.   Por favor, tenga en cuenta que aunque hacemos todo lo posible para estar disponibles para asuntos urgentes fuera del horario de [place], no estamos disponibles las 24 horas del da, los 7 [address]  [address] de la [place].   Si tiene un problema urgente y no puede comunicarse con nosotros, puede optar por buscar atencin mdica  en el consultorio de su doctor(a), en una clnica privada, en un centro de atencin urgente o en una sala de emergencias.  Si tiene Engineer, drilling, por favor llame inmediatamente al 911 o vaya a la sala de emergencias.  Nmeros de  bper  - Dr. Gwen Pounds: [phone]  - Dra. Roseanne Reno: [phone]  - Dr. Katrinka Blazing: [phone]   En caso de inclemencias del tiempo, por favor llame a Lacy Duverney principal al [phone] para una actualizacin sobre el [place] de cualquier retraso o cierre.  Consejos para la medicacin en dermatologa: Por favor, guarde las cajas en las que vienen los medicamentos de uso tpico para ayudarle a seguir las instrucciones sobre dnde y cmo usarlos. Las farmacias generalmente imprimen las instrucciones del medicamento slo en las cajas y no directamente en los tubos del [place].   Si su medicamento es muy caro, por favor, pngase en contacto con Rolm Gala llamando al [phone] y presione la opcin 4 o envenos un mensaje a travs de Clinical cytogeneticist.   No podemos decirle cul ser su copago por los medicamentos por adelantado ya que esto es diferente dependiendo de la cobertura de su seguro. Sin embargo, es posible que podamos encontrar un medicamento sustituto a Audiological scientist un formulario para  que el seguro cubra el medicamento que se considera necesario.   Si se requiere una autorizacin previa para que su compaa de seguros [place] su medicamento, por favor permtanos de 1 a 2 das hbiles para completar [address]  Los precios de los medicamentos varan con frecuencia dependiendo del Environmental consultant de dnde se surte la receta y alguna farmacias pueden ofrecer precios ms baratos.  El sitio web www.goodrx.com tiene cupones para medicamentos de Health and safety inspector. Los precios aqu no tienen en cuenta lo que podra costar con la ayuda del seguro (puede ser ms barato con su seguro), pero el sitio web puede darle el precio si no utiliz Tourist information centre manager.  - Puede imprimir el cupn correspondiente y llevarlo con su receta a la farmacia.  - Tambin puede pasar por nuestra oficina durante el horario de atencin regular y Education officer, museum una tarjeta de cupones de GoodRx.  - Si necesita que su receta se  enve electrnicamente a una farmacia diferente, informe a nuestra oficina a travs de MyChart de [place] o por telfono llamando al [phone] y presione la opcin 4.

## 2023-07-29 ENCOUNTER — Telehealth: Payer: BC Managed Care – PPO | Admitting: Nurse Practitioner

## 2023-07-29 DIAGNOSIS — R3989 Other symptoms and signs involving the genitourinary system: Secondary | ICD-10-CM | POA: Diagnosis not present

## 2023-07-29 MED ORDER — CEPHALEXIN 500 MG PO CAPS
500.0000 mg | ORAL_CAPSULE | Freq: Three times a day (TID) | ORAL | 0 refills | Status: AC
Start: 2023-07-29 — End: 2023-08-05

## 2023-07-29 NOTE — Patient Instructions (Signed)
For new or worsening symptoms:   [facility] Directions [phone] [address] [place], [place] [address]

## 2023-07-29 NOTE — Progress Notes (Signed)
Virtual Visit Consent   Yolanda Pace, you are scheduled for a virtual visit with a [facility] provider today. Just as with appointments in the office, your consent must be obtained to participate. Your consent will be active for this visit and any virtual visit you may have with one of our providers in the next 365 days. If you have a MyChart account, a copy of this consent can be sent to you electronically.  As this is a virtual visit, video technology does not allow for your provider to perform a traditional examination. This may limit your provider's ability to fully assess your condition. If your provider identifies any concerns that need to be evaluated in person or the need to arrange testing (such as labs, EKG, etc.), we will make arrangements to do so. Although advances in technology are sophisticated, we cannot ensure that it will always work on either your end or our end. If the connection with a video visit is poor, the visit may have to be switched to a telephone visit. With either a video or telephone visit, we are not always able to ensure that we have a secure connection.  By engaging in this virtual visit, you consent to the provision of healthcare and authorize for your insurance to be billed (if applicable) for the services provided during this visit. Depending on your insurance coverage, you may receive a charge related to this service.  I need to obtain your verbal consent now. Are you willing to proceed with your visit today? Yolanda Pace has provided verbal consent on 07/29/2023 for a virtual visit (video or telephone). Viviano Simas, FNP  Date: 07/29/2023 8:57 AM  Virtual Visit via Video Note   I, Viviano Simas, connected with  Yolanda Pace  ([id number], 03-Apr-1966) on 07/29/23 at  9:00 AM EDT by a video-enabled telemedicine application and verified that I am speaking with the correct person using two identifiers.  Location: Patient: Virtual Visit Location Patient: Home Provider:  Virtual Visit Location Provider: Home Office   I discussed the limitations of evaluation and management by telemedicine and the availability of in person appointments. The patient expressed understanding and agreed to proceed.    History of Present Illness: Yolanda Pace is a 57 y.o. who identifies as a female who was assigned female at birth, and is being seen today for burning with urination.  Symptom onset was 3 days ago  Denies fevers N/V  She had a UTI in the spring most recently treated at a walk in clinic  Most recent viewable culture was from 2020 though she notes that she had one recently at the UC. 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/2022   Major depression, recurrent, chronic (HCC) 04/08/2022   Symptomatic varicose veins, left 03/19/2022   Palpitations 07/13/2021   Diabetes mellitus with microalbuminuria (HCC) 02/23/2021   Fibromyalgia 02/20/2021   Connective tissue disease (HCC) 10/31/2020   Chronic shoulder pain 07/07/2020   Mild intermittent asthma without complication 07/07/2020   Traumatic incomplete tear of left rotator cuff 04/23/2020   Arthralgia 08/03/2019   Gastric polyp    Benign neoplasm  of transverse colon    Internal hemorrhoids    Malignant neoplasm of skin 06/19/2018   Chronic pain syndrome 06/19/2018   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   Kidney stone 02/20/2018   Renal lesion 02/20/2018   Benign hypertension 01/21/2018   Pelvic pain 12/26/2017   GERD (gastroesophageal reflux disease) 11/09/2017   Elevated beta-2 microglobulin 07/11/2017   Right thyroid nodule 05/26/2017    Mild hypercholesterolemia 05/06/2017   Generalized osteoarthritis of hand 12/13/2016   Impingement syndrome of shoulder region, left 11/29/2016   Anxiety    Allergic rhinitis    Vitamin D deficiency disease     Allergies:  Allergies  Allergen Reactions   Betadine [Povidone Iodine] Swelling, Dermatitis and Rash    Pt states site gets infected   Metrizamide Swelling    Noted in [facility] chart on 01/06/2012. Premedicate patient prior to contrast media injections.   Other     [place]cal[place]: vomiting    Salicylic Acid-Sulfur     Burst eye vessels   Bee Pollen     Sneezing, watery eyes, coughing.    Meloxicam Itching   Pollen Extract Other (See Comments)    Sneezing, watery eyes, coughing.    Sulindac Anxiety and Other (See Comments)   Compazine  [Prochlorperazine] Other (See Comments)    Jaws locked up   Contrast Media [Iodinated Contrast Media] Swelling    Noted in [facility] chart on 01/06/2012. Premedicate patient prior to contrast media injections.   Doxycycline Nausea Only    GI upset   Elemental Sulfur Other (See Comments)    Made all blood vessels in eyes burst   Gabapentin Nausea And Vomiting    Lip numbness    Keppra [Levetiracetam]     Pt was not herself    Lyrica [Pregabalin]     Tingling, depression, blister mouth    Rosuvastatin     Mental fogginess, confusion, fatigue    Codeine Nausea And Vomiting   Hydroxychloroquine Nausea And Vomiting and Nausea Only    Ear clicking   Iodine Swelling   Sulfa Antibiotics Nausea And Vomiting and Other (See Comments)   Medications:  Current Outpatient Medications:    acetaminophen (TYLENOL) 500 MG tablet, Take 1,000 mg by mouth every 8 (eight) hours as needed for moderate pain., Disp: , Rfl:    albuterol (PROVENTIL) (2.5 MG/3ML) 0.083% nebulizer solution, Take 3 mLs (2.5 mg total) by nebulization every 4 (four) hours as needed for wheezing or shortness of breath., Disp: 75 mL, Rfl: 1   albuterol (VENTOLIN HFA) 108 (90 Base)  MCG/ACT inhaler, Inhale 2 puffs into the lungs 4 (four) times daily as needed for shortness of breath or wheezing., Disp: , Rfl:    blood glucose meter kit and supplies, Dispense based on patient and insurance preference. Use up to four times daily as directed. (FOR ICD-10 E10.9, E11.9)., Disp: 1 each, Rfl: 0   budesonide-formoterol (SYMBICORT) 160-4.5 MCG/ACT inhaler, Inhale 2 puffs into the lungs 2 (two) times daily., Disp: 1 each, Rfl: 3   Cholecalciferol (VITAMIN D) 2000 units tablet, Take 2,000 Units by mouth daily. , Disp: , Rfl:    Crisaborole (EUCRISA) 2 % OINT, Apply twice daily to affected areas on face, Disp: 60 g, Rfl: 1   DULoxetine (CYMBALTA) 30 MG capsule, Take 1 capsule (30 mg total) by mouth daily., Disp: 90 capsule, Rfl: 1   ezetimibe (ZETIA) 10 MG tablet, Take 1 tablet (10 mg total) by mouth daily., Disp: 90 tablet,  Rfl: 3   fluticasone (FLONASE) 50 MCG/ACT nasal spray, Place into both nostrils., Disp: , Rfl:    hydrocortisone 2.5 % cream, Apply 1-2 times daily for up to 1 week to affected areas of rash., Disp: 30 g, Rfl: 1   ibuprofen (ADVIL) 200 MG tablet, Take 400-600 mg by mouth every 8 (eight) hours as needed for moderate pain., Disp: , Rfl:    Ivermectin 1 % CREA, Apply compounded mix to face QAM., Disp: 30 g, Rfl: 11   ketoconazole (NIZORAL) 2 % cream, Apply twice daily to affected areas on face., Disp: 30 g, Rfl: 2   levocetirizine (XYZAL) 5 MG tablet, Take 1 tablet (5 mg total) by mouth every evening., Disp: 90 tablet, Rfl: 1   montelukast (SINGULAIR) 10 MG tablet, Take 1 tablet (10 mg total) by mouth at bedtime., Disp: 90 tablet, Rfl: 1   olmesartan (BENICAR) 40 MG tablet, Take 1 tablet (40 mg total) by mouth daily., Disp: 90 tablet, Rfl: 1   omeprazole (PRILOSEC) 20 MG capsule, Take 1 capsule (20 mg total) by mouth every morning., Disp: 90 capsule, Rfl: 1   Roflumilast (ZORYVE) 0.3 % CREA, Apply to affected areas face once daily until improved., Disp: 60 g, Rfl: 1    Ruxolitinib Phosphate (OPZELURA) 1.5 % CREA, Apply to aa's eczema QD PRN., Disp: 60 g, Rfl: 1  Observations/Objective: Patient is well-developed, well-nourished in no acute distress.  Resting comfortably  at home.  Head is normocephalic, atraumatic.  No labored breathing.  Speech is clear and coherent with logical content.  Patient is alert and oriented at baseline.    Assessment and Plan:  1. Suspected UTI  Push fluids:  - cephALEXin (KEFLEX) 500 MG capsule; Take 1 capsule (500 mg total) by mouth 3 (three) times daily for 7 days.  Dispense: 21 capsule; Refill: 0    Strict follow up precautions for onset of fever, nausea or vomiting advised on local UC she may visit over the weekend if needed    Follow Up Instructions: I discussed the assessment and treatment plan with the patient. The patient was provided an opportunity to ask questions and all were answered. The patient agreed with the plan and demonstrated an understanding of the instructions.  A copy of instructions were sent to the patient via MyChart unless otherwise noted below.    The patient was advised to call back or seek an in-person evaluation if the symptoms worsen or if the condition fails to improve as anticipated.  Time:  I spent 11 minutes with the patient via telehealth technology discussing the above problems/concerns.    Viviano Simas, FNP

## 2023-08-14 ENCOUNTER — Ambulatory Visit
Admission: EM | Admit: 2023-08-14 | Discharge: 2023-08-14 | Disposition: A | Discharge status: Home / Self Care | Payer: BC Managed Care – PPO | Attending: Emergency Medicine | Admitting: Emergency Medicine

## 2023-08-14 ENCOUNTER — Other Ambulatory Visit: Payer: Self-pay

## 2023-08-14 ENCOUNTER — Encounter: Payer: Self-pay | Admitting: Emergency Medicine

## 2023-08-14 DIAGNOSIS — R102 Pelvic and perineal pain: Secondary | ICD-10-CM | POA: Diagnosis not present

## 2023-08-14 DIAGNOSIS — R300 Dysuria: Secondary | ICD-10-CM | POA: Insufficient documentation

## 2023-08-14 LAB — POCT URINALYSIS DIP (MANUAL ENTRY)
Bilirubin, UA: NEGATIVE
Glucose, UA: NEGATIVE mg/dL
Ketones, POC UA: NEGATIVE mg/dL
Nitrite, UA: NEGATIVE
Protein Ur, POC: 30 mg/dL — AB
Spec Grav, UA: >=1.03 — AB (ref 1.010–1.025)
Urobilinogen, UA: 0.2 U/dL
pH, UA: 5.5 (ref 5.0–8.0)

## 2023-08-14 MED ORDER — NITROFURANTOIN MONOHYD MACRO 100 MG PO CAPS: 100.0000 mg | ORAL_CAPSULE | Freq: Two times a day (BID) | ORAL | 0 refills | Status: AC

## 2023-08-14 MED ORDER — PHENAZOPYRIDINE HCL 200 MG PO TABS: 200.0000 mg | ORAL_TABLET | Freq: Three times a day (TID) | ORAL | 0 refills | Status: DC

## 2023-08-14 NOTE — Discharge Instructions (Addendum)
Your urinalysis shows Yolanda Pace blood cells but currently do not show any signs of infection which are indicative of infection, your urine will be sent to the lab to determine exactly which bacteria is present, if any changes need to be made to your medications you will be notified  Begin use of her bed every morning and every evening for 7 days  Use Pyridium every 8 hours as needed to help minimize urinary symptoms until antibiotic is able to remove bacteria , this medication will turn your urine orange  Increase your fluid intake through use of water  As always practice good hygiene, wiping front to back and avoidance of scented vaginal products to prevent further irritation  If symptoms continue to persist after use of medication or recur please follow-up with urgent care or your primary doctor as needed

## 2023-08-14 NOTE — ED Provider Notes (Signed)
[name]    CSN: [id number] Arrival date & time: 08/14/23  1331      History   Chief Complaint Chief Complaint  Patient presents with   Urinary Tract Infection    HPI Yolanda Pace is a 57 y.o. female.   Presents for evaluation of constant lower abdominal pain and constant lower bilateral back pain, urinary frequency, urgency and dysuria beginning 1 day ago.  Symptoms worsening.  Spirits hematuria 2 weeks ago, completed e-visit and was prescribed cephalexin for treatment of urinary infection, symptoms resolved after 3 days and was advised in person evaluation if she began to have reoccurring urinary symptoms.  Denies fever or vaginal symptoms or hematuria at this time.  Has not attempted treatment.  Past Medical History:  Diagnosis Date   Allergic rhinitis    Anxiety    Arthritis    Asthma    WELL CONTROLLED   Atypical mole 04/27/2022   left anterior flank at inframammary, mild atypia   Basal cell carcinoma 05/31/2014   left spinal mid back   Complication of anesthesia    PT STATES THAT SHE WAS GIVEN SOME TYPE OF ANESTHESIA THAT MADE HER HAVE A SEIZURE IN 2015-NOTE IS IN CHART FROM [place] ANESTHESIA FROM 2015   Cystic thyroid nodule 01/2014   3mm on u/s   Depression    Fibromyalgia    GERD (gastroesophageal reflux disease)    History of kidney stones    SMALL STONE CURRENTLY   Hypertension    Insomnia    Joint pain 04/2018   PT SEEING RHEUMATOLOGIST FOR JOINT PAIN THAT OCCURS APP ONCE A MONTH AND IS SO BAD THAT SHE CANT GET OUT OF BED   Obesity    PONV (postoperative nausea and vomiting)    Pre-diabetes    Right thyroid nodule 05/26/2017   Solid 1 cm RIGHT lobe July 2018   Seizures (HCC) 04/2014   provoked during cervical nerve block   Skin cancer 06/2014   removed from back   Squamous cell carcinoma of skin 08/11/2020   Right forearm, [facility]   Vitamin D deficiency disease     Patient Active Problem List   Diagnosis Date Noted   Depression,  major, recurrent, moderate (HCC) 06/27/2023   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/2022   Major depression, recurrent, chronic (HCC) 04/08/2022   Symptomatic varicose veins, left 03/19/2022   Palpitations 07/13/2021   Diabetes mellitus with microalbuminuria (HCC) 02/23/2021   Fibromyalgia 02/20/2021   Connective tissue disease (HCC) 10/31/2020   Chronic shoulder pain 07/07/2020   Mild intermittent asthma without complication 07/07/2020   Traumatic incomplete tear of left rotator cuff 04/23/2020   Arthralgia 08/03/2019   Gastric polyp    Benign neoplasm of transverse colon    Internal hemorrhoids    Malignant neoplasm of skin 06/19/2018   Chronic pain syndrome 06/19/2018   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   Kidney stone 02/20/2018   Renal lesion 02/20/2018   Benign hypertension 01/21/2018   Pelvic pain 12/26/2017   GERD (gastroesophageal reflux disease) 11/09/2017   Elevated beta-2 microglobulin 07/11/2017   Right thyroid nodule 05/26/2017   Mild hypercholesterolemia 05/06/2017   Generalized osteoarthritis of hand 12/13/2016   Impingement syndrome of shoulder region, left 11/29/2016   Anxiety    Allergic rhinitis    Vitamin D deficiency disease     Past Surgical History:  Procedure Laterality Date   ABDOMINAL HYSTERECTOMY  2005ish   fibroids, ovaries remain; along with  bladder tack   BACK SURGERY     Lower back   bladder sling mesh removal  08/2018   bladder tack  2005ish   CHOLECYSTECTOMY     COLONOSCOPY WITH PROPOFOL N/A 06/22/2018   Procedure: COLONOSCOPY WITH PROPOFOL;  Surgeon: Pasty Spillers, MD;  Location: [facility] ENDOSCOPY;  Service: Endoscopy;  Laterality: N/A;   CYSTO WITH HYDRODISTENSION N/A 05/02/2018   Procedure: CYSTOSCOPY/HYDRODISTENSION;  Surgeon: Orson Ape, MD;  Location: [facility] ORS;  Service: Urology;  Laterality: N/A;   ESOPHAGOGASTRODUODENOSCOPY (EGD) WITH PROPOFOL N/A 06/22/2018   Procedure:  ESOPHAGOGASTRODUODENOSCOPY (EGD) WITH PROPOFOL;  Surgeon: Pasty Spillers, MD;  Location: [facility] ENDOSCOPY;  Service: Endoscopy;  Laterality: N/A;   HEMI-MICRODISCECTOMY LUMBAR LAMINECTOMY LEVEL 1 Left 04/27/2021   Procedure: LEFT L4-5 HEMILAMINECTOMY;  Surgeon: Lucy Chris, MD;  Location: [facility] ORS;  Service: Neurosurgery;  Laterality: Left;   INCONTINENCE SURGERY     ROTATOR CUFF REPAIR Left 01/08/2019   rotator cuff revision Left 12/06/2019   SKIN CANCER EXCISION  06/2014   removed from back    OB History   No obstetric history on file.      Home Medications    Prior to Admission medications   Medication Sig Start Date End Date Taking? Authorizing Provider  nitrofurantoin, macrocrystal-monohydrate, (MACROBID) 100 MG capsule Take 1 capsule (100 mg total) by mouth 2 (two) times daily for 7 days. 08/14/23 08/21/23 Yes Bellanie Matthew, Elita Boone, NP  phenazopyridine (PYRIDIUM) 200 MG tablet Take 1 tablet (200 mg total) by mouth 3 (three) times daily. 08/14/23  Yes Zyan Mirkin, Elita Boone, NP  acetaminophen (TYLENOL) 500 MG tablet Take 1,000 mg by mouth every 8 (eight) hours as needed for moderate pain.    [provider]  albuterol (PROVENTIL) (2.5 MG/3ML) 0.083% nebulizer solution Take 3 mLs (2.5 mg total) by nebulization every 4 (four) hours as needed for wheezing or shortness of breath. 10/18/17   Lada, Janit Bern, MD  albuterol (VENTOLIN HFA) 108 (90 Base) MCG/ACT inhaler Inhale 2 puffs into the lungs 4 (four) times daily as needed for shortness of breath or wheezing.    Lorette Ang, MD  blood glucose meter kit and supplies Dispense based on patient and insurance preference. Use up to four times daily as directed. (FOR ICD-10 E10.9, E11.9). 02/23/21   Alba Cory, MD  budesonide-formoterol (SYMBICORT) 160-4.5 MCG/ACT inhaler Inhale 2 puffs into the lungs 2 (two) times daily. 12/27/22   Alba Cory, MD  Cholecalciferol (VITAMIN D) 2000 units tablet Take 2,000 Units by mouth daily.      [provider]  Crisaborole (EUCRISA) 2 % OINT Apply twice daily to affected areas on face 03/23/23   Willeen Niece, MD  DULoxetine (CYMBALTA) 30 MG capsule Take 1 capsule (30 mg total) by mouth daily. 06/27/23   Alba Cory, MD  ezetimibe (ZETIA) 10 MG tablet Take 1 tablet (10 mg total) by mouth daily. 08/24/22   Alba Cory, MD  fluticasone (FLONASE) 50 MCG/ACT nasal spray Place into both nostrils. 03/05/22   [provider]  hydrocortisone 2.5 % cream Apply 1-2 times daily for up to 1 week to affected areas of rash. 09/09/22   Moye, [place], MD  ibuprofen (ADVIL) 200 MG tablet Take 400-600 mg by mouth every 8 (eight) hours as needed for moderate pain.    [provider]  Ivermectin 1 % CREA Apply compounded mix to face QAM. 07/27/23   Willeen Niece, MD  ketoconazole (NIZORAL) 2 % cream Apply twice daily to affected  areas on face. 03/23/23   Willeen Niece, MD  levocetirizine (XYZAL) 5 MG tablet Take 1 tablet (5 mg total) by mouth every evening. 06/27/23   Alba Cory, MD  montelukast (SINGULAIR) 10 MG tablet Take 1 tablet (10 mg total) by mouth at bedtime. 06/27/23   Alba Cory, MD  olmesartan (BENICAR) 40 MG tablet Take 1 tablet (40 mg total) by mouth daily. 06/27/23   Alba Cory, MD  omeprazole (PRILOSEC) 20 MG capsule Take 1 capsule (20 mg total) by mouth every morning. 06/27/23   Alba Cory, MD  Roflumilast (ZORYVE) 0.3 % CREA Apply to affected areas face once daily until improved. 01/03/23   Willeen Niece, MD  Ruxolitinib Phosphate (OPZELURA) 1.5 % CREA Apply to aa's eczema QD PRN. 07/27/23   Willeen Niece, MD    Family History Family History  Problem Relation Age of Onset   Hyperlipidemia Mother    Diabetes Sister    Hyperlipidemia Sister    Hypertension Sister    Hypertension Brother    Hypertension Son    Kidney Stones Son    Pneumonia Maternal Grandmother    Heart attack Maternal Grandfather    Hypertension Sister    Epilepsy Sister     Hypertension Sister    Hypertension Daughter    Kidney Stones Daughter    Cancer Neg Hx    COPD Neg Hx    Stroke Neg Hx     Social History Social History   Tobacco Use   Smoking status: Never   Smokeless tobacco: Never  Vaping Use   Vaping status: Never Used  Substance Use Topics   Alcohol use: Yes    Comment: occ   Drug use: No     Allergies   Betadine [povidone iodine], Metrizamide, Other, Salicylic acid-sulfur, Bee pollen, Meloxicam, Pollen extract, Sulindac, Compazine  [prochlorperazine], Contrast media [iodinated contrast media], Doxycycline, Elemental sulfur, Gabapentin, Keppra [levetiracetam], Lyrica [pregabalin], Rosuvastatin, Codeine, Hydroxychloroquine, Iodine, and Sulfa antibiotics   Review of Systems Review of Systems   Physical Exam Triage Vital Signs ED Triage Vitals  Encounter Vitals Group     BP 08/14/23 1429 (!) 157/71     Systolic BP Percentile --      Diastolic BP Percentile --      Pulse Rate 08/14/23 1429 83     Resp 08/14/23 1429 20     Temp 08/14/23 1429 98.4 F (36.9 C)     Temp Source 08/14/23 1429 Oral     SpO2 08/14/23 1429 95 %     Weight --      Height --      Head Circumference --      Peak Flow --      Pain Score 08/14/23 1422 7     Pain Loc --      Pain Education --      Exclude from Growth Chart --    No data found.  Updated Vital Signs BP (!) 157/71 (BP Location: Left Arm)   Pulse 83   Temp 98.4 F (36.9 C) (Oral)   Resp 20   SpO2 95%   Visual Acuity Right Eye Distance:   Left Eye Distance:   Bilateral Distance:    Right Eye Near:   Left Eye Near:    Bilateral Near:     Physical Exam Constitutional:      Appearance: Normal appearance.  Eyes:     Extraocular Movements: Extraocular movements intact.  Pulmonary:     Effort: Pulmonary effort is normal.  Abdominal:     General: Abdomen is flat. Bowel sounds are normal.     Palpations: Abdomen is soft.     Tenderness: There is abdominal tenderness in the  suprapubic area. There is no right CVA tenderness or left CVA tenderness.  Neurological:     Mental Status: She is alert and oriented to person, place, and time. Mental status is at baseline.      UC Treatments / Results  Labs (all labs ordered are listed, but only abnormal results are displayed) Labs Reviewed  POCT URINALYSIS DIP (MANUAL ENTRY) - Abnormal; Notable for the following components:      Result Value   Clarity, UA cloudy (*)    Spec Grav, UA >=1.030 (*)    Blood, UA moderate (*)    Protein Ur, POC =30 (*)    Leukocytes, UA Small (1+) (*)    All other components within normal limits  URINE CULTURE    EKG   Radiology No results found.  Procedures Procedures (including critical care time)  Medications Ordered in UC Medications - No data to display  Initial Impression / Assessment and Plan / UC Course  I have reviewed the triage vital signs and the nursing notes.  Pertinent labs & imaging results that were available during my care of the patient were reviewed by me and considered in my medical decision making (see chart for details).  Acute suprapubic pain, dysuria  Urinalysis showing leukocytes, negative for nitrates, sent for culture, as patient is symptomatic started on antibiotic prophylactically, Macrobid sent to pharmacy as well as Pyridium for symptomatic management, recommended additional supportive measures with follow-up as needed if symptoms continue to persist or worsen Final Clinical Impressions(s) / UC Diagnoses   Final diagnoses:  Dysuria  Suprapubic pain, acute     Discharge Instructions      Your urinalysis shows Yolanda Pace blood cells but currently do not show any signs of infection which are indicative of infection, your urine will be sent to the lab to determine exactly which bacteria is present, if any changes need to be made to your medications you will be notified  Begin use of her bed every morning and every evening for 7 days  Use  Pyridium every 8 hours as needed to help minimize urinary symptoms until antibiotic is able to remove bacteria , this medication will turn your urine orange  Increase your fluid intake through use of water  As always practice good hygiene, wiping front to back and avoidance of scented vaginal products to prevent further irritation  If symptoms continue to persist after use of medication or recur please follow-up with urgent care or your primary doctor as needed    ED Prescriptions     Medication Sig Dispense Auth. Provider   nitrofurantoin, macrocrystal-monohydrate, (MACROBID) 100 MG capsule Take 1 capsule (100 mg total) by mouth 2 (two) times daily for 7 days. 14 capsule Shiven Junious R, NP   phenazopyridine (PYRIDIUM) 200 MG tablet Take 1 tablet (200 mg total) by mouth 3 (three) times daily. 6 tablet Yolanda Hoar, NP      PDMP not reviewed this encounter.   Yolanda Pace, [place] 08/17/23 [phone]

## 2023-08-14 NOTE — ED Triage Notes (Signed)
One week ago had blood in urine.  The televisit at that time told patient to come for a visit if not any better.  Patient states she felt better until yesterday.    Pain in back and genitalia.  Constant sense of having to urinate.

## 2023-08-16 DIAGNOSIS — N390 Urinary tract infection, site not specified: Secondary | ICD-10-CM | POA: Insufficient documentation

## 2023-08-16 DIAGNOSIS — N812 Incomplete uterovaginal prolapse: Secondary | ICD-10-CM | POA: Insufficient documentation

## 2023-08-16 DIAGNOSIS — N3946 Mixed incontinence: Secondary | ICD-10-CM | POA: Insufficient documentation

## 2023-08-16 LAB — URINE CULTURE: Culture: >=100000 — AB

## 2023-09-19 ENCOUNTER — Ambulatory Visit: Payer: BC Managed Care – PPO | Admitting: Dietician

## 2023-12-14 ENCOUNTER — Ambulatory Visit
Admission: EM | Admit: 2023-12-14 | Discharge: 2023-12-14 | Disposition: A | Discharge status: Home / Self Care | Payer: 59

## 2023-12-14 DIAGNOSIS — R110 Nausea: Secondary | ICD-10-CM

## 2023-12-14 DIAGNOSIS — R0789 Other chest pain: Secondary | ICD-10-CM

## 2023-12-14 DIAGNOSIS — R8290 Unspecified abnormal findings in urine: Secondary | ICD-10-CM | POA: Diagnosis not present

## 2023-12-14 DIAGNOSIS — R6883 Chills (without fever): Secondary | ICD-10-CM

## 2023-12-14 DIAGNOSIS — T50905A Adverse effect of unspecified drugs, medicaments and biological substances, initial encounter: Secondary | ICD-10-CM

## 2023-12-14 LAB — POCT URINALYSIS DIP (MANUAL ENTRY)
Bilirubin, UA: NEGATIVE
Blood, UA: NEGATIVE
Glucose, UA: NEGATIVE mg/dL
Ketones, POC UA: NEGATIVE mg/dL
Leukocytes, UA: NEGATIVE
Nitrite, UA: NEGATIVE
Protein Ur, POC: NEGATIVE mg/dL
Spec Grav, UA: >=1.03 — AB (ref 1.010–1.025)
Urobilinogen, UA: 0.2 U/dL
pH, UA: 5.5 (ref 5.0–8.0)

## 2023-12-14 NOTE — Discharge Instructions (Signed)
Stop taking the Macrobid.  Follow up with your primary care provider tomorrow.  Go to the emergency department if you have worsening symptoms.

## 2023-12-14 NOTE — ED Triage Notes (Signed)
Patient to Urgent Care with complaints of shortness of breath/ cough/ chills/ possible fevers after taking Macrobid.   Currently being treated w/ Macrobid for a UTI. First dose Friday night. Started developing some URI symptoms then when she woke up on Saturday felt worse. Did not take a dose on Sunday and started feeling better. Took an additional dose on Monday and stared feeling the same flu-like symptoms.   Unsure if she has ever been treated with Macrobid prior to this.

## 2023-12-14 NOTE — ED Provider Notes (Signed)
Yolanda Pace    CSN: [id number] Arrival date & time: 12/14/23  0907      History   Chief Complaint Chief Complaint  Patient presents with   Cough    HPI Yolanda Pace is a 58 y.o. female.  Patient presents with chest tightness, chills, nausea after taking Macrobid.  She took the first dose of Macrobid on 12/09/2023 and within a couple of hours developed chest tightness, chills, nausea.  She took a second dose the next morning and had similar symptoms.  She stopped the medication and felt better.  She was asymptomatic and feeling good on 12/11/2023 and 12/12/2023.  She resumed the Macrobid in the evening on 12/12/2023 and developed similar symptoms within a couple of hours.  She took the medication twice on 12/13/2023 and had similar symptoms.  She has not taken the Macrobid today.  She states she feels better today.  No nausea or chest pain.  No fever or chills.  No occultly swallowing or shortness of breath.  Patient was prescribed Macrobid on 12/07/2023 by her gynecologist.  She does not currently have any dysuria or hematuria.  The history is provided by the patient and medical records.    Past Medical History:  Diagnosis Date   Allergic rhinitis    Anxiety    Arthritis    Asthma    WELL CONTROLLED   Atypical mole 04/27/2022   left anterior flank at inframammary, mild atypia   Basal cell carcinoma 05/31/2014   left spinal mid back   Complication of anesthesia    PT STATES THAT SHE WAS GIVEN SOME TYPE OF ANESTHESIA THAT MADE HER HAVE A SEIZURE IN 2015-NOTE IS IN CHART FROM [place] [facility] FROM 2015   Cystic thyroid nodule 01/2014   3mm on u/s   Depression    Fibromyalgia    GERD (gastroesophageal reflux disease)    History of kidney stones    SMALL STONE CURRENTLY   Hypertension    Insomnia    Joint pain 04/2018   PT SEEING RHEUMATOLOGIST FOR JOINT PAIN THAT OCCURS APP ONCE A MONTH AND IS SO BAD THAT SHE CANT GET OUT OF BED   Obesity    PONV (postoperative  nausea and vomiting)    Pre-diabetes    Right thyroid nodule 05/26/2017   Solid 1 cm RIGHT lobe July 2018   Seizures (HCC) 04/2014   provoked during cervical nerve block   Skin cancer 06/2014   removed from back   Squamous cell carcinoma of skin 08/11/2020   Right forearm, [facility]   Vitamin D deficiency disease     Patient Active Problem List   Diagnosis Date Noted   Depression, major, recurrent, moderate (HCC) 06/27/2023   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/2022   Major depression, recurrent, chronic (HCC) 04/08/2022   Symptomatic varicose veins, left 03/19/2022   Palpitations 07/13/2021   Diabetes mellitus with microalbuminuria (HCC) 02/23/2021   Fibromyalgia 02/20/2021   Connective tissue disease (HCC) 10/31/2020   Chronic shoulder pain 07/07/2020   Mild intermittent asthma without complication 07/07/2020   Traumatic incomplete tear of left rotator cuff 04/23/2020   Arthralgia 08/03/2019   Gastric polyp    Benign neoplasm of transverse colon    Internal hemorrhoids    Malignant neoplasm of skin 06/19/2018   Chronic pain syndrome 06/19/2018   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   Kidney stone 02/20/2018   Renal lesion 02/20/2018   Benign hypertension 01/21/2018   Pelvic pain  12/26/2017   GERD (gastroesophageal reflux disease) 11/09/2017   Elevated beta-2 microglobulin 07/11/2017   Right thyroid nodule 05/26/2017   Mild hypercholesterolemia 05/06/2017   Generalized osteoarthritis of hand 12/13/2016   Impingement syndrome of shoulder region, left 11/29/2016   Anxiety    Allergic rhinitis    Vitamin D deficiency disease     Past Surgical History:  Procedure Laterality Date   ABDOMINAL HYSTERECTOMY  2005ish   fibroids, ovaries remain; along with bladder tack   BACK SURGERY     Lower back   bladder sling mesh removal  08/2018   bladder tack  2005ish   CHOLECYSTECTOMY     COLONOSCOPY WITH PROPOFOL N/A 06/22/2018   Procedure:  COLONOSCOPY WITH PROPOFOL;  Surgeon: Pasty Spillers, MD;  Location: [facility];  Service: Endoscopy;  Laterality: N/A;   CYSTO WITH HYDRODISTENSION N/A 05/02/2018   Procedure: CYSTOSCOPY/HYDRODISTENSION;  Surgeon: Orson Ape, MD;  Location: [facility];  Service: Urology;  Laterality: N/A;   ESOPHAGOGASTRODUODENOSCOPY (EGD) WITH PROPOFOL N/A 06/22/2018   Procedure: ESOPHAGOGASTRODUODENOSCOPY (EGD) WITH PROPOFOL;  Surgeon: Pasty Spillers, MD;  Location: [facility];  Service: Endoscopy;  Laterality: N/A;   HEMI-MICRODISCECTOMY LUMBAR LAMINECTOMY LEVEL 1 Left 04/27/2021   Procedure: LEFT L4-5 HEMILAMINECTOMY;  Surgeon: Lucy Chris, MD;  Location: [facility];  Service: Neurosurgery;  Laterality: Left;   INCONTINENCE SURGERY     ROTATOR CUFF REPAIR Left 01/08/2019   rotator cuff revision Left 12/06/2019   SKIN CANCER EXCISION  06/2014   removed from back    OB History   No obstetric history on file.      Home Medications    Prior to Admission medications   Medication Sig Start Date End Date Taking? Authorizing Provider  acetaminophen (TYLENOL) 500 MG tablet Take 1,000 mg by mouth every 8 (eight) hours as needed for moderate pain.    [provider]  albuterol (PROVENTIL) (2.5 MG/3ML) 0.083% nebulizer solution Take 3 mLs (2.5 mg total) by nebulization every 4 (four) hours as needed for wheezing or shortness of breath. 10/18/17   Lada, Janit Bern, MD  albuterol (VENTOLIN HFA) 108 (90 Base) MCG/ACT inhaler Inhale 2 puffs into the lungs 4 (four) times daily as needed for shortness of breath or wheezing.    Lorette Ang, MD  blood glucose meter kit and supplies Dispense based on patient and insurance preference. Use up to four times daily as directed. (FOR ICD-10 E10.9, E11.9). 02/23/21   Alba Cory, MD  budesonide-formoterol (SYMBICORT) 160-4.5 MCG/ACT inhaler Inhale 2 puffs into the lungs 2 (two) times daily. 12/27/22   Alba Cory, MD  Cholecalciferol  (VITAMIN D) 2000 units tablet Take 2,000 Units by mouth daily.     [provider]  Crisaborole (EUCRISA) 2 % OINT Apply twice daily to affected areas on face 03/23/23   Willeen Niece, MD  DULoxetine (CYMBALTA) 30 MG capsule Take 1 capsule (30 mg total) by mouth daily. 06/27/23   Alba Cory, MD  ezetimibe (ZETIA) 10 MG tablet Take 1 tablet (10 mg total) by mouth daily. 08/24/22   Alba Cory, MD  fluticasone (FLONASE) 50 MCG/ACT nasal spray Place into both nostrils. 03/05/22   [provider]  hydrocortisone 2.5 % cream Apply 1-2 times daily for up to 1 week to affected areas of rash. 09/09/22   Moye, [place], MD  ibuprofen (ADVIL) 200 MG tablet Take 400-600 mg by mouth every 8 (eight) hours as needed for moderate pain.    [provider]  Ivermectin  1 % CREA Apply compounded mix to face QAM. 07/27/23   Willeen Niece, MD  ketoconazole (NIZORAL) 2 % cream Apply twice daily to affected areas on face. 03/23/23   Willeen Niece, MD  levocetirizine (XYZAL) 5 MG tablet Take 1 tablet (5 mg total) by mouth every evening. 06/27/23   Alba Cory, MD  montelukast (SINGULAIR) 10 MG tablet Take 1 tablet (10 mg total) by mouth at bedtime. 06/27/23   Alba Cory, MD  olmesartan (BENICAR) 40 MG tablet Take 1 tablet (40 mg total) by mouth daily. 06/27/23   Alba Cory, MD  omeprazole (PRILOSEC) 20 MG capsule Take 1 capsule (20 mg total) by mouth every morning. 06/27/23   Alba Cory, MD  phenazopyridine (PYRIDIUM) 200 MG tablet Take 1 tablet (200 mg total) by mouth 3 (three) times daily. Patient not taking: Reported on 12/14/2023 08/14/23   Valinda Hoar, NP  Roflumilast (ZORYVE) 0.3 % CREA Apply to affected areas face once daily until improved. 01/03/23   Willeen Niece, MD  Ruxolitinib Phosphate (OPZELURA) 1.5 % CREA Apply to aa's eczema QD PRN. 07/27/23   Willeen Niece, MD    Family History Family History  Problem Relation Age of Onset   Hyperlipidemia Mother     Diabetes Sister    Hyperlipidemia Sister    Hypertension Sister    Hypertension Brother    Hypertension Son    Kidney Stones Son    Pneumonia Maternal Grandmother    Heart attack Maternal Grandfather    Hypertension Sister    Epilepsy Sister    Hypertension Sister    Hypertension Daughter    Kidney Stones Daughter    Cancer Neg Hx    COPD Neg Hx    Stroke Neg Hx     Social History Social History   Tobacco Use   Smoking status: Never   Smokeless tobacco: Never  Vaping Use   Vaping status: Never Used  Substance Use Topics   Alcohol use: Yes    Comment: occ   Drug use: No     Allergies   Betadine [povidone iodine], Metrizamide, Other, Salicylic acid-sulfur, Bee pollen, Meloxicam, Pollen extract, Sulindac, Compazine  [prochlorperazine], Contrast media [iodinated contrast media], Doxycycline, Elemental sulfur, Gabapentin, Keppra [levetiracetam], Lyrica [pregabalin], Macrobid [nitrofurantoin], Rosuvastatin, Codeine, Hydroxychloroquine, Iodine, and Sulfa antibiotics   Review of Systems Review of Systems  Constitutional:  Positive for chills. Negative for fever.  HENT:  Negative for sore throat, trouble swallowing and voice change.   Respiratory:  Positive for chest tightness. Negative for cough and shortness of breath.   Cardiovascular:  Negative for chest pain and palpitations.  Gastrointestinal:  Positive for nausea. Negative for diarrhea and vomiting.  Genitourinary:  Negative for dysuria and hematuria.     Physical Exam Triage Vital Signs ED Triage Vitals  Encounter Vitals Group     BP 12/14/23 1037 137/89     Systolic BP Percentile --      Diastolic BP Percentile --      Pulse Rate 12/14/23 1024 88     Resp 12/14/23 1024 18     Temp 12/14/23 1024 98.7 F (37.1 C)     Temp src --      SpO2 12/14/23 1024 96 %     Weight --      Height --      Head Circumference --      Peak Flow --      Pain Score 12/14/23 1030 4     Pain Loc --  Pain Education --       Exclude from Growth Chart --    No data found.  Updated Vital Signs BP 137/89   Pulse 88   Temp 98.7 F (37.1 C)   Resp 18   SpO2 96%   Visual Acuity Right Eye Distance:   Left Eye Distance:   Bilateral Distance:    Right Eye Near:   Left Eye Near:    Bilateral Near:     Physical Exam Constitutional:      General: She is not in acute distress. HENT:     Mouth/Throat:     Mouth: Mucous membranes are moist.     Pharynx: Oropharynx is clear.  Cardiovascular:     Rate and Rhythm: Normal rate and regular rhythm.     Heart sounds: Normal heart sounds.  Pulmonary:     Effort: Pulmonary effort is normal. No respiratory distress.     Breath sounds: Normal breath sounds.  Abdominal:     General: Bowel sounds are normal.     Palpations: Abdomen is soft.     Tenderness: There is no abdominal tenderness. There is no right CVA tenderness, left CVA tenderness, guarding or rebound.  Neurological:     Mental Status: She is alert.      UC Treatments / Results  Labs (all labs ordered are listed, but only abnormal results are displayed) Labs Reviewed  POCT URINALYSIS DIP (MANUAL ENTRY) - Abnormal; Notable for the following components:      Result Value   Spec Grav, UA >=1.030 (*)    All other components within normal limits    EKG   Radiology No results found.  Procedures Procedures (including critical care time)  Medications Ordered in UC Medications - No data to display  Initial Impression / Assessment and Plan / UC Course  I have reviewed the triage vital signs and the nursing notes.  Pertinent labs & imaging results that were available during my care of the patient were reviewed by me and considered in my medical decision making (see chart for details).   Adverse reaction to Macrobid.  Afebrile and vital signs are stable.  Lungs are clear and O2 sat is 96% on room air.  No difficulty swallowing or breathing.  Instructed patient to stop taking the Macrobid.   Added this medication to her list of allergies.  Her urine is negative for leukocytes and nitrites at this time.  Instructed her to increase her water intake.  Instructed her to schedule a follow-up appointment with her PCP.  ED precautions given.  Education provided on drug allergy.  She agrees to plan of care.   Final Clinical Impressions(s) / UC Diagnoses   Final diagnoses:  Adverse effect of drug, initial encounter     Discharge Instructions      Stop taking the Macrobid.  Follow up with your primary care provider tomorrow.  Go to the emergency department if you have worsening symptoms.        ED Prescriptions   None    PDMP not reviewed this encounter.   Mickie Bail, NP 12/14/23 1116

## 2024-01-10 ENCOUNTER — Ambulatory Visit: Payer: 59 | Admitting: Dermatology

## 2024-01-24 ENCOUNTER — Ambulatory Visit: Payer: BC Managed Care – PPO | Admitting: Family Medicine

## 2024-01-24 ENCOUNTER — Encounter: Payer: Self-pay | Admitting: Family Medicine

## 2024-01-24 ENCOUNTER — Telehealth: Payer: Self-pay

## 2024-01-24 VITALS — BP 148/96 | HR 81 | Resp 16 | Ht 63.0 in | Wt 181.1 lb

## 2024-01-24 DIAGNOSIS — E785 Hyperlipidemia, unspecified: Secondary | ICD-10-CM

## 2024-01-24 DIAGNOSIS — F339 Major depressive disorder, recurrent, unspecified: Secondary | ICD-10-CM

## 2024-01-24 DIAGNOSIS — E1169 Type 2 diabetes mellitus with other specified complication: Secondary | ICD-10-CM | POA: Diagnosis not present

## 2024-01-24 DIAGNOSIS — J302 Other seasonal allergic rhinitis: Secondary | ICD-10-CM

## 2024-01-24 DIAGNOSIS — G930 Cerebral cysts: Secondary | ICD-10-CM | POA: Insufficient documentation

## 2024-01-24 DIAGNOSIS — K219 Gastro-esophageal reflux disease without esophagitis: Secondary | ICD-10-CM

## 2024-01-24 DIAGNOSIS — M359 Systemic involvement of connective tissue, unspecified: Secondary | ICD-10-CM | POA: Diagnosis not present

## 2024-01-24 DIAGNOSIS — I10 Essential (primary) hypertension: Secondary | ICD-10-CM

## 2024-01-24 DIAGNOSIS — J4520 Mild intermittent asthma, uncomplicated: Secondary | ICD-10-CM

## 2024-01-24 DIAGNOSIS — J3089 Other allergic rhinitis: Secondary | ICD-10-CM

## 2024-01-24 DIAGNOSIS — M797 Fibromyalgia: Secondary | ICD-10-CM

## 2024-01-24 LAB — POCT GLYCOSYLATED HEMOGLOBIN (HGB A1C): Hemoglobin A1C: 6.4 % — AB (ref 4.0–5.6)

## 2024-01-24 MED ORDER — ACETAMINOPHEN 500 MG PO TABS: 500.0000 mg | ORAL_TABLET | Freq: Four times a day (QID) | ORAL | 1 refills | Status: AC | PRN

## 2024-01-24 MED ORDER — MONTELUKAST SODIUM 10 MG PO TABS: 10.0000 mg | ORAL_TABLET | Freq: Every day | ORAL | 1 refills | Status: DC

## 2024-01-24 MED ORDER — OMEPRAZOLE 20 MG PO CPDR: 20.0000 mg | DELAYED_RELEASE_CAPSULE | Freq: Every morning | ORAL | 1 refills | Status: DC

## 2024-01-24 MED ORDER — BUDESONIDE-FORMOTEROL FUMARATE 160-4.5 MCG/ACT IN AERO
2.0000 | INHALATION_SPRAY | Freq: Two times a day (BID) | RESPIRATORY_TRACT | 3 refills | Status: DC
Start: 2024-01-24 — End: 2024-09-19

## 2024-01-24 MED ORDER — RYBELSUS 3 MG PO TABS
3.0000 mg | ORAL_TABLET | Freq: Every day | ORAL | 0 refills | Status: AC
Start: 2024-01-24 — End: ?

## 2024-01-24 MED ORDER — OLMESARTAN MEDOXOMIL 40 MG PO TABS: 40.0000 mg | ORAL_TABLET | Freq: Every day | ORAL | 1 refills | Status: DC

## 2024-01-24 MED ORDER — AMLODIPINE BESYLATE 2.5 MG PO TABS: 2.5000 mg | ORAL_TABLET | Freq: Every evening | ORAL | 0 refills | Status: DC

## 2024-01-24 MED ORDER — DULOXETINE HCL 30 MG PO CPEP
30.0000 mg | ORAL_CAPSULE | Freq: Every day | ORAL | 1 refills | Status: DC
Start: 2024-01-24 — End: 2024-09-06

## 2024-01-24 MED ORDER — LEVOCETIRIZINE DIHYDROCHLORIDE 5 MG PO TABS: 5.0000 mg | ORAL_TABLET | Freq: Every evening | ORAL | 1 refills | Status: DC

## 2024-01-24 NOTE — Telephone Encounter (Signed)
 Cover My Meds Prior Auth on    Semaglutide (RYBELSUS) 3 MG TABS   KEY; [id number]

## 2024-01-24 NOTE — Progress Notes (Addendum)
 Name: Yolanda Pace   MRN: [id number]    DOB: [birth year]   Date:01/24/2024       Progress Note  Subjective  Chief Complaint  Chief Complaint  Patient presents with   Medical Management of Chronic Issues   HPI   MDD recurrent : she is currently taking Duloxetine 30 mg, she was doing very well but only responds to a specific brand that is no longer being manufactured. She switched brands two weeks ago and has noticed increase in pain also down mood, she does not want to adjust dose or medication since she had multiple side effects to medications in the past. She has not been sleeping lately due to pain.    Dizziness and abnormal Brain MRI: seen by ENT and saw neurologist had vestibular rehab and was doing well however mild flare recently    IMPRESSION: 1. No internal auditory canal or cerebellopontine angle mass lesion or focus of abnormal contrast enhancement. 2. 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 Mass effect is noted on the anterior left temporal lobe. 3. Minimal amount of nonspecific T2 hyperintense lesions of the white matter, may represent early chronic microangiopathy   FMS/elevated CRP/Connective Tissue Disease : she was seen by Dr. Gareth Morgan in the past She states x-rays were done and also labs , inflammatory markers improved and she was given reassurance that is it OA and to continue to monitor.  Takes Duloxetine  30 mg  to help with pain and also mood. She has daily pain, she also has multiple areas of OA and worse pain has been on her thumbs, she had more injection by ortho at [facility] - Dr. Landry Mellow . She states worse symptoms are on her wrists and knees at this time. She states change on the brand of duloxetine seems to have affected her pain level . Advised to try taking tylenol 500 mg up to four pills per day to control pain and ibuprofen only prn due to possible side  effects   DM in obese: glucose in urine, A1C was  6.4% , 6.1 % it went up to 7.5 % 5.9 % 5.5% down to 5.3 %, after that  6.3 % ,  6.7 %  and today is down to 6.4 % . Discussed importance of statin therapy but it caused her muscle pain in the past , gave her zetia Fall 2023 but she stopped taking it on her own. Reviewed her cardiac calcium score, explained even though the test says low risk having diabetes is cardiac risk equivalent . She went to see a dietician and we reviewed a healthy diet today . Discussed GLP-1 agonist and she is willing to try Rybelsus , she denies personal history of pancreatitis or family history of thyroid cancer   GERD: controlled with medication, denies heartburn or indigestion . Taking Omeprazole and symptoms are controlled   Eczema: seeing Dermatologist and symptoms are controlled at this time   Mild intermittent asthma: she used to see Dr. Welton Flakes. She is using Symbicort prn only Taking singulair, and symptoms are controlled   HTN: bp is high, we will add norvasc 2.5 mg at night and continue Olmasartan 40 mg daily . She denies chest pain or palpitation   Chronic back pain with radiculitis: had lumbar laminectomy done by Dr. Adriana Simas June 2022 currently taking low dose duloxetine  No bowel or bladder incontinence. She is under the care  of Dr. Council Mechanic , she had lumbar steroid injection 11/2022 and again in Sep. 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/2022   Major depression, recurrent, chronic (HCC) 04/08/2022   Symptomatic varicose veins, left 03/19/2022   Palpitations 07/13/2021   Diabetes mellitus with microalbuminuria (HCC) 02/23/2021   Fibromyalgia  02/20/2021   Connective tissue disease (HCC) 10/31/2020   Chronic shoulder pain 07/07/2020   Mild intermittent asthma without complication 07/07/2020   Traumatic incomplete tear of left rotator cuff 04/23/2020   Arthralgia 08/03/2019   Gastric polyp    Benign neoplasm of transverse colon    Internal hemorrhoids    Malignant neoplasm of skin 06/19/2018   Chronic pain syndrome 06/19/2018   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   Kidney stone 02/20/2018   Renal lesion 02/20/2018   Benign hypertension 01/21/2018   Pelvic pain 12/26/2017   GERD (gastroesophageal reflux disease) 11/09/2017   Elevated beta-2 microglobulin 07/11/2017   Right thyroid nodule 05/26/2017   Mild hypercholesterolemia 05/06/2017   Generalized osteoarthritis of hand 12/13/2016   Impingement syndrome of shoulder region, left 11/29/2016   Anxiety    Allergic rhinitis    Vitamin D deficiency disease     Past Surgical History:  Procedure Laterality Date   ABDOMINAL HYSTERECTOMY  2005ish   fibroids, ovaries remain; along with bladder tack   BACK SURGERY     Lower back   bladder sling mesh removal  08/2018   bladder tack  2005ish   CHOLECYSTECTOMY     COLONOSCOPY WITH PROPOFOL N/A 06/22/2018   Procedure: COLONOSCOPY WITH PROPOFOL;  Surgeon: Pasty Spillers, MD;  Location: [facility];  Service: Endoscopy;  Laterality: N/A;   CYSTO WITH HYDRODISTENSION N/A 05/02/2018   Procedure: CYSTOSCOPY/HYDRODISTENSION;  Surgeon: Orson Ape, MD;  Location: [facility];  Service: Urology;  Laterality: N/A;   ESOPHAGOGASTRODUODENOSCOPY (EGD) WITH PROPOFOL N/A 06/22/2018   Procedure: ESOPHAGOGASTRODUODENOSCOPY (EGD) WITH PROPOFOL;  Surgeon: Pasty Spillers, MD;  Location: [facility];  Service: Endoscopy;  Laterality: N/A;   HEMI-MICRODISCECTOMY LUMBAR LAMINECTOMY LEVEL 1 Left 04/27/2021   Procedure: LEFT L4-5 HEMILAMINECTOMY;  Surgeon: Lucy Chris, MD;  Location: [facility];  Service:  Neurosurgery;  Laterality: Left;   INCONTINENCE SURGERY     ROTATOR CUFF REPAIR Left 01/08/2019   rotator cuff revision Left 12/06/2019   SKIN CANCER EXCISION  06/2014   removed from back    Family History  Problem Relation Age of Onset   Hyperlipidemia Mother    Diabetes Sister    Hyperlipidemia Sister    Hypertension Sister    Hypertension Brother    Hypertension Son    Kidney Stones Son    Pneumonia Maternal Grandmother    Heart attack Maternal Grandfather    Hypertension Sister    Epilepsy Sister    Hypertension Sister    Hypertension Daughter    Kidney Stones Daughter    Cancer Neg Hx    COPD Neg Hx    Stroke Neg Hx     Social History   Tobacco Use   Smoking status: Never   Smokeless tobacco: Never  Substance Use Topics   Alcohol use: Yes    Comment: occ  Current Outpatient Medications:    albuterol (PROVENTIL) (2.5 MG/3ML) 0.083% nebulizer solution, Take 3 mLs (2.5 mg total) by nebulization every 4 (four) hours as needed for wheezing or shortness of breath., Disp: 75 mL, Rfl: 1   amLODipine (NORVASC) 2.5 MG tablet, Take 1 tablet (2.5 mg total) by mouth every evening., Disp: 90 tablet, Rfl: 0   blood glucose meter kit and supplies, Dispense based on patient and insurance preference. Use up to four times daily as directed. (FOR ICD-10 E10.9, E11.9)., Disp: 1 each, Rfl: 0   Cholecalciferol (VITAMIN D) 2000 units tablet, Take 2,000 Units by mouth daily. , Disp: , Rfl:    Crisaborole (EUCRISA) 2 % OINT, Apply twice daily to affected areas on face, Disp: 60 g, Rfl: 1   hydrocortisone 2.5 % cream, Apply 1-2 times daily for up to 1 week to affected areas of rash., Disp: 30 g, Rfl: 1   ibuprofen (ADVIL) 200 MG tablet, Take 400-600 mg by mouth every 8 (eight) hours as needed for moderate pain., Disp: , Rfl:    Ivermectin 1 % CREA, Apply compounded mix to face QAM., Disp: 30 g, Rfl: 11   ketoconazole (NIZORAL) 2 % cream, Apply twice daily to affected areas on face.,  Disp: 30 g, Rfl: 2   Roflumilast (ZORYVE) 0.3 % CREA, Apply to affected areas face once daily until improved., Disp: 60 g, Rfl: 1   Ruxolitinib Phosphate (OPZELURA) 1.5 % CREA, Apply to aa's eczema QD PRN., Disp: 60 g, Rfl: 1   Semaglutide (RYBELSUS) 3 MG TABS, Take 1 tablet (3 mg total) by mouth daily., Disp: 30 tablet, Rfl: 0   acetaminophen (TYLENOL) 500 MG tablet, Take 1 tablet (500 mg total) by mouth every 6 (six) hours as needed for moderate pain (pain score 4-6)., Disp: 120 tablet, Rfl: 1   budesonide-formoterol (SYMBICORT) 160-4.5 MCG/ACT inhaler, Inhale 2 puffs into the lungs 2 (two) times daily., Disp: 1 each, Rfl: 3   DULoxetine (CYMBALTA) 30 MG capsule, Take 1 capsule (30 mg total) by mouth daily., Disp: 90 capsule, Rfl: 1   levocetirizine (XYZAL) 5 MG tablet, Take 1 tablet (5 mg total) by mouth every evening., Disp: 90 tablet, Rfl: 1   montelukast (SINGULAIR) 10 MG tablet, Take 1 tablet (10 mg total) by mouth at bedtime., Disp: 90 tablet, Rfl: 1   olmesartan (BENICAR) 40 MG tablet, Take 1 tablet (40 mg total) by mouth daily., Disp: 90 tablet, Rfl: 1   omeprazole (PRILOSEC) 20 MG capsule, Take 1 capsule (20 mg total) by mouth every morning., Disp: 90 capsule, Rfl: 1  Allergies  Allergen Reactions   Betadine [Povidone Iodine] Swelling, Dermatitis and Rash    Pt states site gets infected   Metrizamide Swelling    Noted in [facility] chart on 01/06/2012. Premedicate patient prior to contrast media injections.   Other     Mescalor: vomiting    Salicylic Acid-Sulfur     Burst eye vessels   Bee Pollen     Sneezing, watery eyes, coughing.    Meloxicam Itching   Pollen Extract Other (See Comments)    Sneezing, watery eyes, coughing.    Sulindac Anxiety and Other (See Comments)   Compazine  [Prochlorperazine] Other (See Comments)    Jaws locked up   Contrast Media [Iodinated Contrast Media] Swelling    Noted in [facility] chart on 01/06/2012. Premedicate patient prior to contrast  media injections.   Doxycycline Nausea Only    GI upset   Elemental Sulfur  Other (See Comments)    Made all blood vessels in eyes burst   Gabapentin Nausea And Vomiting    Lip numbness    Keppra [Levetiracetam]     Pt was not herself    Lyrica [Pregabalin]     Tingling, depression, blister mouth    Macrobid [Nitrofurantoin] Other (See Comments)    Chest tightness, chills, nausea   Rosuvastatin     Mental fogginess, confusion, fatigue    Codeine Nausea And Vomiting   Hydroxychloroquine Nausea And Vomiting and Nausea Only    Ear clicking   Iodine Swelling   Sulfa Antibiotics Nausea And Vomiting and Other (See Comments)    I personally reviewed active problem list, medication list, allergies, family history with the patient/caregiver today.   ROS  Ten systems reviewed and is negative except as mentioned in HPI    Objective  Vitals:   01/24/24 0857 01/24/24 0940  BP: (!) 162/90 (!) 148/96  Pulse: 81   Resp: 16   SpO2: 96%   Weight: 181 lb 1.6 oz (82.1 kg)   Height: 5\' 3"  (1.6 m)     Body mass index is 32.08 kg/m.  Physical Exam  Constitutional: Patient appears well-developed and well-nourished. Obese  No distress.  HEENT: head atraumatic, normocephalic, pupils equal and reactive to light, neck supple Cardiovascular: Normal rate, regular rhythm and normal heart sounds.  No murmur heard. No BLE edema. Pulmonary/Chest: Effort normal and breath sounds normal. No respiratory distress. Abdominal: Soft.  There is no tenderness. Psychiatric: Patient has a normal mood and affect. behavior is normal. Judgment and thought content normal.   Recent Results (from the past 2160 hours)  POCT urinalysis dipstick     Status: Abnormal   Collection Time: 12/14/23 10:59 AM  Result Value Ref Range   Color, UA yellow yellow   Clarity, UA clear clear   Glucose, UA negative negative mg/dL   Bilirubin, UA negative negative   Ketones, POC UA negative negative mg/dL   Spec Grav, UA  >=0.981 (A) 1.010 - 1.025   Blood, UA negative negative   pH, UA 5.5 5.0 - 8.0   Protein Ur, POC negative negative mg/dL   Urobilinogen, UA 0.2 0.2 or 1.0 E.U./dL   Nitrite, UA Negative Negative   Leukocytes, UA Negative Negative  POCT glycosylated hemoglobin (Hb A1C)     Status: Abnormal   Collection Time: 01/24/24  9:00 AM  Result Value Ref Range   Hemoglobin A1C 6.4 (A) 4.0 - 5.6 %   HbA1c POC (<> result, manual entry)     HbA1c, POC (prediabetic range)     HbA1c, POC (controlled diabetic range)      Diabetic Foot Exam:     PHQ2/9:    01/24/2024    8:55 AM 07/25/2023    9:28 AM 06/27/2023    9:13 AM 03/28/2023    8:43 AM 12/27/2022    9:10 AM  Depression screen PHQ 2/9  Decreased Interest 1 2 1 2 2   Down, Depressed, Hopeless 1 2 2 2 2   PHQ - 2 Score 2 4 3 4 4   Altered sleeping 1  1 3 2   Tired, decreased energy 1  1 3 1   Change in appetite 1  1 1 3   Feeling bad or failure about yourself  1  3 2 2   Trouble concentrating 0  0 2 3  Moving slowly or fidgety/restless 0  0 0 0  Suicidal thoughts 0 1 0 2 2  PHQ-9 Score  6  9 17 17   Difficult doing work/chores Very difficult        phq 9 is positive  Fall Risk:    07/25/2023    9:27 AM 06/27/2023    9:13 AM 03/28/2023    8:43 AM 12/27/2022    9:09 AM 08/24/2022    8:44 AM  Fall Risk   Falls in the past year? 1 0 0 0 0  Number falls in past yr: 0 0 0 0   Injury with Fall? 1 0 0 0   Risk for fall due to :  No Fall Risks No Fall Risks No Fall Risks No Fall Risks  Follow up  Falls prevention discussed Falls prevention discussed Falls prevention discussed Falls prevention discussed     Assessment & Plan  1. Dyslipidemia associated with type 2 diabetes mellitus (HCC) (Primary)  - POCT glycosylated hemoglobin (Hb A1C) - Microalbumin / creatinine urine ratio - Semaglutide (RYBELSUS) 3 MG TABS; Take 1 tablet (3 mg total) by mouth daily.  Dispense: 30 tablet; Refill: 0  2. Connective tissue disease (HCC)  Worse since change  in duloxetine brand, try taking tylenol four times daily   3. Major depression, recurrent, chronic (HCC)  - DULoxetine (CYMBALTA) 30 MG capsule; Take 1 capsule (30 mg total) by mouth daily.  Dispense: 90 capsule; Refill: 1  4. GERD without esophagitis  - omeprazole (PRILOSEC) 20 MG capsule; Take 1 capsule (20 mg total) by mouth every morning.  Dispense: 90 capsule; Refill: 1  5. Benign hypertension  - olmesartan (BENICAR) 40 MG tablet; Take 1 tablet (40 mg total) by mouth daily.  Dispense: 90 tablet; Refill: 1  6. Asthma, mild intermittent, well-controlled  - montelukast (SINGULAIR) 10 MG tablet; Take 1 tablet (10 mg total) by mouth at bedtime.  Dispense: 90 tablet; Refill: 1 - budesonide-formoterol (SYMBICORT) 160-4.5 MCG/ACT inhaler; Inhale 2 puffs into the lungs 2 (two) times daily.  Dispense: 1 each; Refill: 3  7. Fibromyalgia  - DULoxetine (CYMBALTA) 30 MG capsule; Take 1 capsule (30 mg total) by mouth daily.  Dispense: 90 capsule; Refill: 1  8. Perennial allergic rhinitis with seasonal variation  - levocetirizine (XYZAL) 5 MG tablet; Take 1 tablet (5 mg total) by mouth every evening.  Dispense: 90 tablet; Refill: 1 - montelukast (SINGULAIR) 10 MG tablet; Take 1 tablet (10 mg total) by mouth at bedtime.  Dispense: 90 tablet; Refill: 1

## 2024-01-24 NOTE — Telephone Encounter (Signed)
 Started through covermymeds.

## 2024-02-13 ENCOUNTER — Ambulatory Visit: Payer: 59 | Admitting: Dermatology

## 2024-04-25 ENCOUNTER — Ambulatory Visit: Admitting: Family Medicine

## 2024-04-30 ENCOUNTER — Other Ambulatory Visit: Payer: Self-pay | Admitting: Otolaryngology

## 2024-04-30 DIAGNOSIS — E041 Nontoxic single thyroid nodule: Secondary | ICD-10-CM

## 2024-05-31 ENCOUNTER — Telehealth: Admitting: Physician Assistant

## 2024-05-31 DIAGNOSIS — R3989 Other symptoms and signs involving the genitourinary system: Secondary | ICD-10-CM

## 2024-05-31 MED ORDER — SULFAMETHOXAZOLE-TRIMETHOPRIM 800-160 MG PO TABS
1.0000 | ORAL_TABLET | Freq: Two times a day (BID) | ORAL | 0 refills | Status: DC
Start: 2024-05-31 — End: 2024-09-19

## 2024-05-31 NOTE — Progress Notes (Signed)
 Virtual Visit Consent   Yolanda Pace, you are scheduled for a virtual visit with a [facility] provider today. Just as with appointments in the office, your consent must be obtained to participate. Your consent will be active for this visit and any virtual visit you may have with one of our providers in the next 365 days. If you have a MyChart account, a copy of this consent can be sent to you electronically.  As this is a virtual visit, video technology does not allow for your provider to perform a traditional examination. This may limit your provider's ability to fully assess your condition. If your provider identifies any concerns that need to be evaluated in person or the need to arrange testing (such as labs, EKG, etc.), we will make arrangements to do so. Although advances in technology are sophisticated, we cannot ensure that it will always work on either your end or our end. If the connection with a video visit is poor, the visit may have to be switched to a telephone visit. With either a video or telephone visit, we are not always able to ensure that we have a secure connection.  By engaging in this virtual visit, you consent to the provision of healthcare and authorize for your insurance to be billed (if applicable) for the services provided during this visit. Depending on your insurance coverage, you may receive a charge related to this service.  I need to obtain your verbal consent now. Are you willing to proceed with your visit today? Yolanda Pace has provided verbal consent on 05/31/2024 for a virtual visit (video or telephone). Yolanda Pace, [place]  Date: 05/31/2024 12:07 PM   Virtual Visit via Video Note   I, Yolanda Pace, connected with  Yolanda Pace  ([id number], 1966/07/24) on 05/31/24 at 12:30 PM EDT by a video-enabled telemedicine application and verified that I am speaking with the correct person using two identifiers.  Location: Patient: Virtual Visit Location Patient:  Home Provider: Virtual Visit Location Provider: Home Office   I discussed the limitations of evaluation and management by telemedicine and the availability of in person appointments. The patient expressed understanding and agreed to proceed.    History of Present Illness: Yolanda Pace is a 58 y.o. who identifies as a female who was assigned female at birth, and is being seen today for possible UTI. Patient endorses symptoms starting in the past few days after returning from the beach. Endorses dysuria, urgency/frequency, nausea and low back pain. Denies fever or flank pain. Has upcoming appointment with a UroGynecologist for further evaluation. Notes last UTI a few months ago she was treated with Bactrim  which worked well for her.    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   Major depression, recurrent, chronic (HCC) 04/08/2022   Symptomatic varicose veins, left 03/19/2022   Palpitations 07/13/2021   Diabetes mellitus with microalbuminuria (HCC) 02/23/2021   Fibromyalgia 02/20/2021   Connective tissue disease (HCC) 10/31/2020   Chronic shoulder pain 07/07/2020   Mild intermittent asthma without complication 07/07/2020   Traumatic incomplete tear of left rotator cuff 04/23/2020   Arthralgia 08/03/2019   Gastric polyp    Benign neoplasm of transverse colon    Internal  hemorrhoids    Malignant neoplasm of skin 06/19/2018   Chronic pain syndrome 06/19/2018   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   Kidney stone 02/20/2018   Renal lesion 02/20/2018   Benign hypertension 01/21/2018   Pelvic pain 12/26/2017   GERD (gastroesophageal reflux disease) 11/09/2017   Elevated beta-2  microglobulin 07/11/2017   Right thyroid  nodule 05/26/2017   Mild hypercholesterolemia 05/06/2017   Generalized osteoarthritis of hand 12/13/2016   Impingement syndrome of shoulder region, left 11/29/2016   Anxiety    Allergic rhinitis    Vitamin D  deficiency disease     Allergies:  Allergies  Allergen Reactions   Betadine [Povidone Iodine] Swelling, Dermatitis and Rash    Pt states site gets infected   Metrizamide Swelling    Noted in [facility] chart on 01/06/2012. Premedicate patient prior to contrast media injections.   Other     [place]: vomiting    Salicylic Acid-Sulfur     Burst eye vessels   Bee Pollen     Sneezing, watery eyes, coughing.    Meloxicam  Itching   Pollen Extract Other (See Comments)    Sneezing, watery eyes, coughing.    Sulindac Anxiety and Other (See Comments)   Compazine  [Prochlorperazine] Other (See Comments)    Jaws locked up   Contrast Media [Iodinated Contrast Media] Swelling    Noted in [facility] chart on 01/06/2012. Premedicate patient prior to contrast media injections.   Doxycycline Nausea Only    GI upset   Elemental Sulfur Other (See Comments)    Made all blood vessels in eyes burst   Gabapentin Nausea And Vomiting    Lip numbness    Keppra  [Levetiracetam ]     Pt was not herself    Lyrica  [Pregabalin ]     Tingling, depression, blister mouth    Macrobid  [Nitrofurantoin ] Other (See Comments)    Chest tightness, chills, nausea   Rosuvastatin      Mental fogginess, confusion, fatigue    Codeine Nausea And Vomiting   Hydroxychloroquine Nausea And Vomiting and Nausea Only    Ear clicking   Iodine Swelling   Sulfa  Antibiotics Nausea And Vomiting and Other (See Comments)   Medications:  Current Outpatient Medications:    sulfamethoxazole -trimethoprim  (BACTRIM  DS) 800-160 MG tablet, Take 1 tablet by mouth 2 (two) times daily., Disp: 14 tablet, Rfl: 0   acetaminophen  (TYLENOL ) 500 MG tablet, Take 1 tablet (500 mg total) by mouth every 6  (six) hours as needed for moderate pain (pain score 4-6)., Disp: 120 tablet, Rfl: 1   albuterol  (PROVENTIL ) (2.5 MG/3ML) 0.083% nebulizer solution, Take 3 mLs (2.5 mg total) by nebulization every 4 (four) hours as needed for wheezing or shortness of breath., Disp: 75 mL, Rfl: 1   amLODipine  (NORVASC ) 2.5 MG tablet, Take 1 tablet (2.5 mg total) by mouth every evening., Disp: 90 tablet, Rfl: 0   blood glucose meter kit and supplies, Dispense based on patient and insurance preference. Use up to four times daily as directed. (FOR ICD-10 E10.9, E11.9)., Disp: 1 each, Rfl: 0   budesonide -formoterol  (SYMBICORT ) 160-4.5 MCG/ACT inhaler, Inhale 2 puffs into the lungs 2 (two) times daily., Disp: 1 each, Rfl: 3   Cholecalciferol (VITAMIN D ) 2000 units tablet, Take 2,000 Units by mouth daily. , Disp: , Rfl:    Crisaborole  (EUCRISA ) 2 % OINT, Apply twice daily to affected areas on face, Disp: 60 g, Rfl: 1   DULoxetine  (CYMBALTA ) 30 MG capsule, Take 1 capsule (30 mg total) by  mouth daily., Disp: 90 capsule, Rfl: 1   hydrocortisone  2.5 % cream, Apply 1-2 times daily for up to 1 week to affected areas of rash., Disp: 30 g, Rfl: 1   ibuprofen (ADVIL) 200 MG tablet, Take 400-600 mg by mouth every 8 (eight) hours as needed for moderate pain., Disp: , Rfl:    Ivermectin  1 % CREA, Apply compounded mix to face QAM., Disp: 30 g, Rfl: 11   ketoconazole  (NIZORAL ) 2 % cream, Apply twice daily to affected areas on face., Disp: 30 g, Rfl: 2   levocetirizine (XYZAL ) 5 MG tablet, Take 1 tablet (5 mg total) by mouth every evening., Disp: 90 tablet, Rfl: 1   montelukast  (SINGULAIR ) 10 MG tablet, Take 1 tablet (10 mg total) by mouth at bedtime., Disp: 90 tablet, Rfl: 1   olmesartan  (BENICAR ) 40 MG tablet, Take 1 tablet (40 mg total) by mouth daily., Disp: 90 tablet, Rfl: 1   omeprazole  (PRILOSEC) 20 MG capsule, Take 1 capsule (20 mg total) by mouth every morning., Disp: 90 capsule, Rfl: 1   Roflumilast  (ZORYVE ) 0.3 % CREA, Apply to  affected areas face once daily until improved., Disp: 60 g, Rfl: 1   Ruxolitinib Phosphate  (OPZELURA ) 1.5 % CREA, Apply to aa's eczema QD PRN., Disp: 60 g, Rfl: 1   Semaglutide  (RYBELSUS ) 3 MG TABS, Take 1 tablet (3 mg total) by mouth daily., Disp: 30 tablet, Rfl: 0  Observations/Objective: Patient is well-developed, well-nourished in no acute distress.  Resting comfortably at home.  Head is normocephalic, atraumatic.  No labored breathing.  Speech is clear and coherent with logical content.  Patient is alert and oriented at baseline.   Assessment and Plan: 1. Suspected UTI (Primary) - sulfamethoxazole -trimethoprim  (BACTRIM  DS) 800-160 MG tablet; Take 1 tablet by mouth 2 (two) times daily.  Dispense: 14 tablet; Refill: 0  Classic UTI symptoms with absence of alarm signs or symptoms. Prior history of UTI. Will treat empirically with Bactrim  per patient preference for suspected uncomplicated cystitis. Supportive measures and OTC medications reviewed. Strict in-person evaluation precautions discussed.    Follow Up Instructions: I discussed the assessment and treatment plan with the patient. The patient was provided an opportunity to ask questions and all were answered. The patient agreed with the plan and demonstrated an understanding of the instructions.  A copy of instructions were sent to the patient via MyChart unless otherwise noted below.   The patient was advised to call back or seek an in-person evaluation if the symptoms worsen or if the condition fails to improve as anticipated.    Yolanda Velma Lunger, PA-C

## 2024-05-31 NOTE — Patient Instructions (Signed)
 Zaray D Delangel, thank you for joining [name], PA-C for today's virtual visit.  While this provider is not your primary care provider (PCP), if your PCP is located in our provider database this encounter information will be shared with them immediately following your visit.   A [facility] MyChart account gives you access to today's visit and all your visits, tests, and labs performed at [facility]  click here if you don't have a [facility] MyChart account or go to [email or web address]  Consent: (Patient) Yolanda Pace provided verbal consent for this virtual visit at the beginning of the encounter.  Current Medications:  Current Outpatient Medications:    acetaminophen  (TYLENOL ) 500 MG tablet, Take 1 tablet (500 mg total) by mouth every 6 (six) hours as needed for moderate pain (pain score 4-6)., Disp: 120 tablet, Rfl: 1   albuterol  (PROVENTIL ) (2.5 MG/3ML) 0.083% nebulizer solution, Take 3 mLs (2.5 mg total) by nebulization every 4 (four) hours as needed for wheezing or shortness of breath., Disp: 75 mL, Rfl: 1   amLODipine  (NORVASC ) 2.5 MG tablet, Take 1 tablet (2.5 mg total) by mouth every evening., Disp: 90 tablet, Rfl: 0   blood glucose meter kit and supplies, Dispense based on patient and insurance preference. Use up to four times daily as directed. (FOR ICD-10 E10.9, E11.9)., Disp: 1 each, Rfl: 0   budesonide -formoterol  (SYMBICORT ) 160-4.5 MCG/ACT inhaler, Inhale 2 puffs into the lungs 2 (two) times daily., Disp: 1 each, Rfl: 3   Cholecalciferol (VITAMIN D ) 2000 units tablet, Take 2,000 Units by mouth daily. , Disp: , Rfl:    Crisaborole  (EUCRISA ) 2 % OINT, Apply twice daily to affected areas on face, Disp: 60 g, Rfl: 1   DULoxetine  (CYMBALTA ) 30 MG capsule, Take 1 capsule (30 mg total) by mouth daily., Disp: 90 capsule, Rfl: 1   hydrocortisone  2.5 % cream, Apply 1-2 times daily for up to 1 week to affected areas of rash., Disp: 30 g, Rfl: 1   ibuprofen (ADVIL)  200 MG tablet, Take 400-600 mg by mouth every 8 (eight) hours as needed for moderate pain., Disp: , Rfl:    Ivermectin  1 % CREA, Apply compounded mix to face QAM., Disp: 30 g, Rfl: 11   ketoconazole  (NIZORAL ) 2 % cream, Apply twice daily to affected areas on face., Disp: 30 g, Rfl: 2   levocetirizine (XYZAL ) 5 MG tablet, Take 1 tablet (5 mg total) by mouth every evening., Disp: 90 tablet, Rfl: 1   montelukast  (SINGULAIR ) 10 MG tablet, Take 1 tablet (10 mg total) by mouth at bedtime., Disp: 90 tablet, Rfl: 1   olmesartan  (BENICAR ) 40 MG tablet, Take 1 tablet (40 mg total) by mouth daily., Disp: 90 tablet, Rfl: 1   omeprazole  (PRILOSEC) 20 MG capsule, Take 1 capsule (20 mg total) by mouth every morning., Disp: 90 capsule, Rfl: 1   Roflumilast  (ZORYVE ) 0.3 % CREA, Apply to affected areas face once daily until improved., Disp: 60 g, Rfl: 1   Ruxolitinib Phosphate  (OPZELURA ) 1.5 % CREA, Apply to aa's eczema QD PRN., Disp: 60 g, Rfl: 1   Semaglutide  (RYBELSUS ) 3 MG TABS, Take 1 tablet (3 mg total) by mouth daily., Disp: 30 tablet, Rfl: 0   Medications ordered in this encounter:  No orders of the defined types were placed in this encounter.    *If you need refills on other medications prior to your next appointment, please contact your pharmacy*  Follow-Up: Call back or seek an in-person evaluation if  the symptoms worsen or if the condition fails to improve as anticipated.  [facility] [phone]  Other Instructions Your symptoms are consistent with a bladder infection, also called acute cystitis. Please take your antibiotic (Bactrim ) as directed until all pills are gone.  Stay very well hydrated.  Consider a daily probiotic (Align, Culturelle, or Activia) to help prevent stomach upset caused by the antibiotic.  Taking a probiotic daily may also help prevent recurrent UTIs.  Also consider taking AZO (Phenazopyridine ) tablets to help decrease pain with urination.     Urinary Tract  Infection A urinary tract infection (UTI) can occur any place along the urinary tract. The tract includes the kidneys, ureters, bladder, and urethra. A type of germ called bacteria often causes a UTI. UTIs are often helped with antibiotic medicine.  HOME CARE  If given, take antibiotics as told by your doctor. Finish them even if you start to feel better. Drink enough fluids to keep your pee (urine) clear or pale yellow. Avoid tea, drinks with caffeine, and bubbly (carbonated) drinks. Pee often. Avoid holding your pee in for a long time. Pee before and after having sex (intercourse). Wipe from front to back after you poop (bowel movement) if you are a woman. Use each tissue only once. GET HELP RIGHT AWAY IF:  You have back pain. You have lower belly (abdominal) pain. You have chills. You feel sick to your stomach (nauseous). You throw up (vomit). Your burning or discomfort with peeing does not go away. You have a fever. Your symptoms are not better in 3 days. MAKE SURE YOU:  Understand these instructions. Will watch your condition. Will get help right away if you are not doing well or get worse. Document Released: 04/19/2008 Document Revised: 07/26/2012 Document Reviewed: 06/01/2012 [facility] Patient Information 2015 [place], [place]. This information is not intended to replace advice given to you by your health care provider. Make sure you discuss any questions you have with your health care provider.    If you have been instructed to have an in-person evaluation today at a local Urgent Care facility, please use the link below. It will take you to a list of all of our available [facility] [facility], including address, phone number and hours of operation. Please do not delay care.  [facility]  If you or a family member do not have a primary care provider, use the link below to schedule a visit and establish care. When you choose a [facility] primary care physician or  advanced practice provider, you gain a long-term partner in health. Find a Primary Care Provider  Learn more about [facility]'s in-office and virtual care options: [facility] - Get Care Now

## 2024-06-05 ENCOUNTER — Inpatient Hospital Stay
Admission: RE | Admit: 2024-06-05 | Discharge: 2024-06-05 | Discharge status: Home / Self Care | Source: Ambulatory Visit | Attending: Otolaryngology | Admitting: Otolaryngology

## 2024-06-05 DIAGNOSIS — E041 Nontoxic single thyroid nodule: Secondary | ICD-10-CM

## 2024-06-07 ENCOUNTER — Encounter: Payer: Self-pay | Admitting: Family Medicine

## 2024-06-07 ENCOUNTER — Ambulatory Visit: Admitting: Family Medicine

## 2024-06-07 VITALS — BP 128/76 | HR 88 | Temp 98.0°F | Resp 16 | Ht 63.0 in | Wt 181.0 lb

## 2024-06-07 DIAGNOSIS — R109 Unspecified abdominal pain: Secondary | ICD-10-CM

## 2024-06-07 LAB — POCT URINALYSIS DIPSTICK
Appearance: NORMAL
Bilirubin, UA: NEGATIVE
Blood, UA: NEGATIVE
Glucose, UA: NEGATIVE
Ketones, UA: NEGATIVE
Leukocytes, UA: NEGATIVE
Nitrite, UA: NEGATIVE
Protein, UA: NEGATIVE
Spec Grav, UA: >=1.03 — AB (ref 1.010–1.025)
Urobilinogen, UA: 0.2 U/dL
pH, UA: 6 (ref 5.0–8.0)

## 2024-06-07 MED ORDER — LIDOCAINE HCL (PF) 1 % IJ SOLN
2.0000 mL | Freq: Once | INTRAMUSCULAR | Status: AC
  Administered 2024-06-07: 2 mL via INTRADERMAL

## 2024-06-07 MED ORDER — CEFTRIAXONE SODIUM 500 MG IJ SOLR: 1000.0000 mg | Freq: Once | INTRAMUSCULAR | Status: DC

## 2024-06-07 MED ORDER — CEFTRIAXONE SODIUM 1 G IJ SOLR
1.0000 g | Freq: Once | INTRAMUSCULAR | Status: AC
  Administered 2024-06-07: 1 g via INTRAMUSCULAR

## 2024-06-07 MED ORDER — CIPROFLOXACIN HCL 250 MG PO TABS: 250.0000 mg | ORAL_TABLET | Freq: Two times a day (BID) | ORAL | 0 refills | Status: AC

## 2024-06-07 NOTE — Progress Notes (Signed)
 Name: Yolanda Pace   MRN: [id number]    DOB: 1965/11/22   Date:06/07/2024       Progress Note  Subjective  Chief Complaint  Chief Complaint  Patient presents with   Flank Pain    X1 week   HPI   Patient has recurrent UTI, she was recently treated through virtual visit and finished last dose of Septra  this morning. She continues to have dysuria , now have chills, nausea  and also left flank pain. Denies hematuria or diarrhea. She has a personal history of kidney stones also.    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   Mixed incontinence 08/16/2023   Prolapsed, uterovaginal, incomplete 08/16/2023   Recurrent UTI 08/16/2023   Depression, major, recurrent, moderate (HCC) 06/27/2023   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/2022   Major depression, recurrent, chronic (HCC) 04/08/2022   Symptomatic varicose veins, left 03/19/2022   Palpitations 07/13/2021   Diabetes mellitus with microalbuminuria (HCC) 02/23/2021   Fibromyalgia 02/20/2021   Connective tissue disease (HCC) 10/31/2020   Chronic shoulder pain 07/07/2020   Mild intermittent asthma without complication 07/07/2020   Traumatic incomplete tear of left rotator cuff 04/23/2020   Arthralgia 08/03/2019   Gastric polyp    Benign neoplasm of transverse colon    Internal hemorrhoids    Malignant neoplasm of skin 06/19/2018   Chronic pain syndrome 06/19/2018   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   Kidney stone 02/20/2018   Renal lesion 02/20/2018   Benign hypertension 01/21/2018   Pelvic pain 12/26/2017   GERD (gastroesophageal reflux disease) 11/09/2017   Elevated beta-2 microglobulin 07/11/2017   Right thyroid  nodule 05/26/2017   Mild hypercholesterolemia 05/06/2017   Generalized osteoarthritis of hand 12/13/2016   Impingement syndrome of shoulder region, left 11/29/2016   Anxiety    Allergic rhinitis    Vitamin D  deficiency disease      Past Surgical History:  Procedure Laterality Date   ABDOMINAL HYSTERECTOMY  2005ish   fibroids, ovaries remain; along with bladder tack   BACK SURGERY     Lower back   bladder sling mesh removal  08/2018   bladder tack  2005ish   CHOLECYSTECTOMY     COLONOSCOPY WITH PROPOFOL  N/A 06/22/2018   Procedure: COLONOSCOPY WITH PROPOFOL ;  Surgeon: Janalyn Keene NOVAK, MD;  Location: [facility];  Service: Endoscopy;  Laterality: N/A;   CYSTO WITH HYDRODISTENSION N/A 05/02/2018   Procedure: CYSTOSCOPY/HYDRODISTENSION;  Surgeon: Kassie Ozell SAUNDERS, MD;  Location: [facility];  Service: Urology;  Laterality: N/A;   ESOPHAGOGASTRODUODENOSCOPY (EGD) WITH PROPOFOL  N/A 06/22/2018   Procedure: ESOPHAGOGASTRODUODENOSCOPY (EGD) WITH PROPOFOL ;  Surgeon: Janalyn Keene NOVAK, MD;  Location: [facility];  Service: Endoscopy;  Laterality: N/A;   HEMI-MICRODISCECTOMY LUMBAR LAMINECTOMY LEVEL 1 Left 04/27/2021   Procedure: LEFT L4-5 HEMILAMINECTOMY;  Surgeon: Bluford Standing, MD;  Location: [facility];  Service: Neurosurgery;  Laterality: Left;   INCONTINENCE SURGERY     ROTATOR CUFF REPAIR Left 01/08/2019   rotator cuff revision Left 12/06/2019   SKIN CANCER EXCISION  06/2014   removed from back    Family History  Problem Relation Age of Onset   Hyperlipidemia Mother    Diabetes Sister    Hyperlipidemia Sister    Hypertension Sister    Hypertension Brother    Hypertension Son    Kidney Stones Son    Pneumonia Maternal Grandmother    Heart attack Maternal Grandfather    Hypertension Sister  Epilepsy Sister    Hypertension Sister    Hypertension Daughter    Kidney Stones Daughter    Cancer Neg Hx    COPD Neg Hx    Stroke Neg Hx     Social History   Tobacco Use   Smoking status: Never   Smokeless tobacco: Never  Substance Use Topics   Alcohol use: Yes    Comment: occ     Current Outpatient Medications:    acetaminophen  (TYLENOL ) 500 MG tablet, Take 1 tablet (500 mg total) by mouth  every 6 (six) hours as needed for moderate pain (pain score 4-6)., Disp: 120 tablet, Rfl: 1   albuterol  (PROVENTIL ) (2.5 MG/3ML) 0.083% nebulizer solution, Take 3 mLs (2.5 mg total) by nebulization every 4 (four) hours as needed for wheezing or shortness of breath., Disp: 75 mL, Rfl: 1   amLODipine  (NORVASC ) 2.5 MG tablet, Take 1 tablet (2.5 mg total) by mouth every evening., Disp: 90 tablet, Rfl: 0   blood glucose meter kit and supplies, Dispense based on patient and insurance preference. Use up to four times daily as directed. (FOR ICD-10 E10.9, E11.9)., Disp: 1 each, Rfl: 0   budesonide -formoterol  (SYMBICORT ) 160-4.5 MCG/ACT inhaler, Inhale 2 puffs into the lungs 2 (two) times daily., Disp: 1 each, Rfl: 3   Cholecalciferol (VITAMIN D ) 2000 units tablet, Take 2,000 Units by mouth daily. , Disp: , Rfl:    ciprofloxacin  (CIPRO ) 250 MG tablet, Take 1 tablet (250 mg total) by mouth 2 (two) times daily for 3 days., Disp: 6 tablet, Rfl: 0   Crisaborole  (EUCRISA ) 2 % OINT, Apply twice daily to affected areas on face, Disp: 60 g, Rfl: 1   DULoxetine  (CYMBALTA ) 30 MG capsule, Take 1 capsule (30 mg total) by mouth daily., Disp: 90 capsule, Rfl: 1   hydrocortisone  2.5 % cream, Apply 1-2 times daily for up to 1 week to affected areas of rash., Disp: 30 g, Rfl: 1   ibuprofen (ADVIL) 200 MG tablet, Take 400-600 mg by mouth every 8 (eight) hours as needed for moderate pain., Disp: , Rfl:    Ivermectin  1 % CREA, Apply compounded mix to face QAM., Disp: 30 g, Rfl: 11   ketoconazole  (NIZORAL ) 2 % cream, Apply twice daily to affected areas on face., Disp: 30 g, Rfl: 2   levocetirizine (XYZAL ) 5 MG tablet, Take 1 tablet (5 mg total) by mouth every evening., Disp: 90 tablet, Rfl: 1   montelukast  (SINGULAIR ) 10 MG tablet, Take 1 tablet (10 mg total) by mouth at bedtime., Disp: 90 tablet, Rfl: 1   olmesartan  (BENICAR ) 40 MG tablet, Take 1 tablet (40 mg total) by mouth daily., Disp: 90 tablet, Rfl: 1   omeprazole   (PRILOSEC) 20 MG capsule, Take 1 capsule (20 mg total) by mouth every morning., Disp: 90 capsule, Rfl: 1   Roflumilast  (ZORYVE ) 0.3 % CREA, Apply to affected areas face once daily until improved., Disp: 60 g, Rfl: 1   Ruxolitinib Phosphate  (OPZELURA ) 1.5 % CREA, Apply to aa's eczema QD PRN., Disp: 60 g, Rfl: 1   Semaglutide  (RYBELSUS ) 3 MG TABS, Take 1 tablet (3 mg total) by mouth daily., Disp: 30 tablet, Rfl: 0   sulfamethoxazole -trimethoprim  (BACTRIM  DS) 800-160 MG tablet, Take 1 tablet by mouth 2 (two) times daily., Disp: 14 tablet, Rfl: 0  Allergies  Allergen Reactions   Betadine [Povidone Iodine] Swelling, Dermatitis and Rash    Pt states site gets infected   Metrizamide Swelling    Noted in [facility] chart  on 01/06/2012. Premedicate patient prior to contrast media injections.   Other     Mescalor: vomiting    Salicylic Acid-Sulfur     Burst eye vessels   Bee Pollen     Sneezing, watery eyes, coughing.    Meloxicam  Itching   Pollen Extract Other (See Comments)    Sneezing, watery eyes, coughing.    Sulindac Anxiety and Other (See Comments)   Compazine  [Prochlorperazine] Other (See Comments)    Jaws locked up   Contrast Media [Iodinated Contrast Media] Swelling    Noted in [facility] chart on 01/06/2012. Premedicate patient prior to contrast media injections.   Doxycycline Nausea Only    GI upset   Gabapentin Nausea And Vomiting    Lip numbness    Keppra  [Levetiracetam ]     Pt was not herself    Lyrica  [Pregabalin ]     Tingling, depression, blister mouth    Macrobid  [Nitrofurantoin ] Other (See Comments)    Chest tightness, chills, nausea   Rosuvastatin      Mental fogginess, confusion, fatigue    Codeine Nausea And Vomiting   Hydroxychloroquine Nausea And Vomiting and Nausea Only    Ear clicking   Iodine Swelling   Sulfa  Antibiotics Nausea And Vomiting and Other (See Comments)    I personally reviewed active problem list, medication list, allergies with the  patient/caregiver today.   ROS  Ten systems reviewed and is negative except as mentioned in HPI    Objective Physical Exam CONSTITUTIONAL: Patient appears well-developed and well-nourished.  No distress. HEENT: Head atraumatic, normocephalic, neck supple. CARDIOVASCULAR: Normal rate, regular rhythm and normal heart sounds.  No murmur heard. No BLE edema. PULMONARY: Effort normal and breath sounds normal. No respiratory distress. ABDOMINAL: normal bowel sounds, abdomen soft, positive CVA tenderness on left side  MUSCULOSKELETAL: Normal gait. Without gross motor or sensory deficit. PSYCHIATRIC: Patient has a normal mood and affect. behavior is normal. Judgment and thought content normal.  Vitals:   06/07/24 0905  BP: 128/76  Pulse: 88  Resp: 16  Temp: 98 F (36.7 C)  SpO2: 98%  Weight: 181 lb (82.1 kg)  Height: 5' 3 (1.6 m)    Body mass index is 32.06 kg/m.  Recent Results (from the past 2160 hours)  POCT urinalysis dipstick     Status: Abnormal   Collection Time: 06/07/24  9:10 AM  Result Value Ref Range   Color, UA Yellow    Clarity, UA Clear    Glucose, UA Negative Negative   Bilirubin, UA Negative    Ketones, UA Negative    Spec Grav, UA >=1.030 (A) 1.010 - 1.025   Blood, UA Negative    pH, UA 6.0 5.0 - 8.0   Protein, UA Negative Negative   Urobilinogen, UA 0.2 0.2 or 1.0 E.U./dL   Nitrite, UA Negative    Leukocytes, UA Negative Negative   Appearance Normal    Odor None     Diabetic Foot Exam:     PHQ2/9:    06/07/2024    9:08 AM 01/24/2024    8:55 AM 07/25/2023    9:28 AM 06/27/2023    9:13 AM 03/28/2023    8:43 AM  Depression screen PHQ 2/9  Decreased Interest 0 1 2 1 2   Down, Depressed, Hopeless 0 1 2 2 2   PHQ - 2 Score 0 2 4 3 4   Altered sleeping 1 1  1 3   Tired, decreased energy 0 1  1 3   Change in appetite 1  1  1 1   Feeling bad or failure about yourself  0 1  3 2   Trouble concentrating 0 0  0 2  Moving slowly or fidgety/restless 0 0  0 0   Suicidal thoughts 0 0 1 0 2  PHQ-9 Score 2 6  9 17   Difficult doing work/chores  Very difficult       phq 9 is negative  Fall Risk:    07/25/2023    9:27 AM 06/27/2023    9:13 AM 03/28/2023    8:43 AM 12/27/2022    9:09 AM 08/24/2022    8:44 AM  Fall Risk   Falls in the past year? 1 0 0 0 0  Number falls in past yr: 0 0 0 0   Injury with Fall? 1 0 0 0   Risk for fall due to :  No Fall Risks No Fall Risks No Fall Risks No Fall Risks  Follow up  Falls prevention discussed Falls prevention discussed Falls prevention discussed Falls prevention discussed      Data saved with a previous flowsheet row definition     Assessment & Plan  1. Flank pain (Primary)  Finished septra  this am, on her chart she is allergic to sulfa  , causes nausea and vomiting. She has been nauseated, but also having chills , feeling tired and has flank pain with personal history of kidney stone. Explained she needs to have CT abdomen to rule out stone but also rule out pyelonephritis but she states she cannot afford it. She is willing to get Rocephin  shot, try Cipro  and hydration today , get CBC and BMP and depending on results and or if she feels worse she will go to [facility] and or get CT scan done . Discussed risk of kidney failure if untreated   - Urine Culture - POCT urinalysis dipstick - CBC with Differential/Platelet - Basic metabolic panel with GFR - CULTURE, URINE COMPREHENSIVE - ciprofloxacin  (CIPRO ) 250 MG tablet; Take 1 tablet (250 mg total) by mouth 2 (two) times daily for 3 days.  Dispense: 6 tablet; Refill: 0 - lidocaine  (PF) (XYLOCAINE ) 1 % injection 2 mL - cefTRIAXone  (ROCEPHIN ) injection 1 g

## 2024-06-09 LAB — CBC WITH DIFFERENTIAL/PLATELET
Absolute Lymphocytes: 2416 {cells}/uL (ref 850–3900)
Absolute Monocytes: 488 {cells}/uL (ref 200–950)
Basophils Absolute: 79 {cells}/uL (ref 0–200)
Basophils Relative: 1.2 %
Eosinophils Absolute: 172 {cells}/uL (ref 15–500)
Eosinophils Relative: 2.6 %
HCT: 44.5 % (ref 35.0–45.0)
Hemoglobin: 14.5 g/dL (ref 11.7–15.5)
MCH: 29.1 pg (ref 27.0–33.0)
MCHC: 32.6 g/dL (ref 32.0–36.0)
MCV: 89.2 fL (ref 80.0–100.0)
MPV: 9.6 fL (ref 7.5–12.5)
Monocytes Relative: 7.4 %
Neutro Abs: 3445 {cells}/uL (ref 1500–7800)
Neutrophils Relative %: 52.2 %
Platelets: 375 Thousand/uL (ref 140–400)
RBC: 4.99 Million/uL (ref 3.80–5.10)
RDW: 12.2 % (ref 11.0–15.0)
Total Lymphocyte: 36.6 %
WBC: 6.6 Thousand/uL (ref 3.8–10.8)

## 2024-06-09 LAB — CULTURE, URINE COMPREHENSIVE
MICRO NUMBER:: 16742549
RESULT:: NO GROWTH
SPECIMEN QUALITY:: ADEQUATE

## 2024-06-09 LAB — BASIC METABOLIC PANEL WITH GFR
BUN: 18 mg/dL (ref 7–25)
CO2: 23 mmol/L (ref 20–32)
Calcium: 10.1 mg/dL (ref 8.6–10.4)
Chloride: 103 mmol/L (ref 98–110)
Creat: 0.73 mg/dL (ref 0.50–1.03)
Glucose, Bld: 165 mg/dL — ABNORMAL HIGH (ref 65–99)
Potassium: 5 mmol/L (ref 3.5–5.3)
Sodium: 138 mmol/L (ref 135–146)
eGFR: 95 mL/min/1.73m2 (ref >=60)

## 2024-06-12 ENCOUNTER — Ambulatory Visit: Payer: Self-pay | Admitting: Family Medicine

## 2024-07-02 LAB — HM DIABETES EYE EXAM

## 2024-07-04 ENCOUNTER — Encounter: Payer: Self-pay | Admitting: Obstetrics and Gynecology

## 2024-07-04 DIAGNOSIS — R928 Other abnormal and inconclusive findings on diagnostic imaging of breast: Secondary | ICD-10-CM

## 2024-07-05 ENCOUNTER — Other Ambulatory Visit: Payer: Self-pay | Admitting: Obstetrics and Gynecology

## 2024-07-05 DIAGNOSIS — N6489 Other specified disorders of breast: Secondary | ICD-10-CM

## 2024-07-13 ENCOUNTER — Ambulatory Visit
Admission: RE | Admit: 2024-07-13 | Discharge: 2024-07-13 | Disposition: A | Discharge status: Home / Self Care | Source: Ambulatory Visit | Attending: Obstetrics and Gynecology | Admitting: Obstetrics and Gynecology

## 2024-07-13 DIAGNOSIS — N6489 Other specified disorders of breast: Secondary | ICD-10-CM | POA: Diagnosis present

## 2024-07-24 ENCOUNTER — Ambulatory Visit: Admitting: Family Medicine

## 2024-08-06 ENCOUNTER — Ambulatory Visit: Payer: BC Managed Care – PPO | Admitting: Dermatology

## 2024-08-09 ENCOUNTER — Ambulatory Visit: Admitting: Dermatology

## 2024-08-21 ENCOUNTER — Other Ambulatory Visit: Payer: Self-pay | Admitting: Family Medicine

## 2024-08-21 DIAGNOSIS — J4520 Mild intermittent asthma, uncomplicated: Secondary | ICD-10-CM

## 2024-08-21 DIAGNOSIS — J302 Other seasonal allergic rhinitis: Secondary | ICD-10-CM

## 2024-08-21 DIAGNOSIS — I10 Essential (primary) hypertension: Secondary | ICD-10-CM

## 2024-08-24 ENCOUNTER — Other Ambulatory Visit: Payer: Self-pay | Admitting: Family Medicine

## 2024-08-24 DIAGNOSIS — J302 Other seasonal allergic rhinitis: Secondary | ICD-10-CM

## 2024-09-06 ENCOUNTER — Other Ambulatory Visit: Payer: Self-pay | Admitting: Family Medicine

## 2024-09-06 DIAGNOSIS — F339 Major depressive disorder, recurrent, unspecified: Secondary | ICD-10-CM

## 2024-09-06 DIAGNOSIS — M797 Fibromyalgia: Secondary | ICD-10-CM

## 2024-09-12 ENCOUNTER — Other Ambulatory Visit: Payer: Self-pay | Admitting: Dermatology

## 2024-09-12 ENCOUNTER — Ambulatory Visit: Admitting: Dermatology

## 2024-09-12 DIAGNOSIS — L814 Other melanin hyperpigmentation: Secondary | ICD-10-CM | POA: Diagnosis not present

## 2024-09-12 DIAGNOSIS — D2271 Melanocytic nevi of right lower limb, including hip: Secondary | ICD-10-CM

## 2024-09-12 DIAGNOSIS — W908XXA Exposure to other nonionizing radiation, initial encounter: Secondary | ICD-10-CM

## 2024-09-12 DIAGNOSIS — L719 Rosacea, unspecified: Secondary | ICD-10-CM

## 2024-09-12 DIAGNOSIS — L578 Other skin changes due to chronic exposure to nonionizing radiation: Secondary | ICD-10-CM | POA: Diagnosis not present

## 2024-09-12 DIAGNOSIS — Z86007 Personal history of in-situ neoplasm of skin: Secondary | ICD-10-CM

## 2024-09-12 DIAGNOSIS — L853 Xerosis cutis: Secondary | ICD-10-CM

## 2024-09-12 DIAGNOSIS — D485 Neoplasm of uncertain behavior of skin: Secondary | ICD-10-CM

## 2024-09-12 DIAGNOSIS — L209 Atopic dermatitis, unspecified: Secondary | ICD-10-CM

## 2024-09-12 DIAGNOSIS — L820 Inflamed seborrheic keratosis: Secondary | ICD-10-CM | POA: Diagnosis not present

## 2024-09-12 DIAGNOSIS — D225 Melanocytic nevi of trunk: Secondary | ICD-10-CM

## 2024-09-12 DIAGNOSIS — D1801 Hemangioma of skin and subcutaneous tissue: Secondary | ICD-10-CM

## 2024-09-12 DIAGNOSIS — L980 Pyogenic granuloma: Secondary | ICD-10-CM | POA: Diagnosis not present

## 2024-09-12 DIAGNOSIS — Z1283 Encounter for screening for malignant neoplasm of skin: Secondary | ICD-10-CM

## 2024-09-12 DIAGNOSIS — L821 Other seborrheic keratosis: Secondary | ICD-10-CM

## 2024-09-12 DIAGNOSIS — Z86018 Personal history of other benign neoplasm: Secondary | ICD-10-CM

## 2024-09-12 DIAGNOSIS — D229 Melanocytic nevi, unspecified: Secondary | ICD-10-CM

## 2024-09-12 DIAGNOSIS — Z85828 Personal history of other malignant neoplasm of skin: Secondary | ICD-10-CM

## 2024-09-12 MED ORDER — OPZELURA 1.5 % EX CREA: TOPICAL_CREAM | CUTANEOUS | 1 refills | Status: DC

## 2024-09-12 NOTE — Progress Notes (Signed)
 Follow-Up Visit   Subjective  Yolanda Pace is a 58 y.o. female who presents for the following: Skin Cancer Screening and Full Body Skin Exam  The patient presents for Total-Body Skin Exam (TBSE) for skin cancer screening and mole check. The patient has spots, moles and lesions to be evaluated, some may be new or changing, including chin, eyebrow, R 4th finger. Patient has a list of several things to check. She has eczema on the face and uses Opzelura  cream, but still has a few areas that won't go away. She has rosacea and uses SM50 Oxymetazoline  1% / Ivermectin  1% / Niacinamide 2% Cream, but not regularly. History of DN, BCC, SCC.   The following portions of the chart were reviewed this encounter and updated as appropriate: medications, allergies, medical history  Review of Systems:  No other skin or systemic complaints except as noted in HPI or Assessment and Plan.  Objective  Well appearing patient in no apparent distress; mood and affect are within normal limits.  A full examination was performed including scalp, head, eyes, ears, nose, lips, neck, chest, axillae, abdomen, back, buttocks, bilateral upper extremities, bilateral lower extremities, hands, feet, fingers, toes, fingernails, and toenails. All findings within normal limits unless otherwise noted below.   Relevant physical exam findings are noted in the Assessment and Plan.  Right palmar 4th finger 5 mm pink red crusted papule     R forehead x 1, L chin x 1, R temple x 1, R zygoma x 1, L infraocular x 1, L abdomen x 1, R spinal mid back x 1 (7) Erythematous stuck-on, waxy papule or plaque. R temporal hairline with 3 mm waxy brown macule.  Assessment & Plan   SKIN CANCER SCREENING PERFORMED TODAY.  ACTINIC DAMAGE - Chronic condition, secondary to cumulative UV/sun exposure - diffuse scaly erythematous macules with underlying dyspigmentation - Recommend daily broad spectrum sunscreen SPF 30+ to sun-exposed areas,  reapply every 2 hours as needed.  - Staying in the shade or wearing long sleeves, sun glasses (UVA+UVB protection) and wide brim hats (4-inch brim around the entire circumference of the hat) are also recommended for sun protection.  - Call for new or changing lesions.  LENTIGINES, SEBORRHEIC KERATOSES, HEMANGIOMAS - Benign normal skin lesions - Benign-appearing - Call for any changes  LENTIGO 2.0 medium dark brown macule of the L lower lip, stable compared to photo from 06/09/2021.   Benign-appearing.  Observation.  Call clinic for new or changing lesions.  Recommend daily use of broad spectrum spf 30+ sunscreen to sun-exposed areas.    MELANOCYTIC NEVI - Tan-brown and/or pink-flesh-colored symmetric macules and papules - 5.0 mm medium brown macule, darker edge at right buttock, stable - 8.0 x 5.0 mm medium brown macule with lighter edge at right upper anterior thigh, stable  - Benign appearing on exam today, Stable - Observation - Call clinic for new or changing moles - Recommend daily use of broad spectrum spf 30+ sunscreen to sun-exposed areas.    HISTORY OF BASAL CELL CARCINOMA OF THE SKIN - No evidence of recurrence today - Recommend regular full body skin exams - Recommend daily broad spectrum sunscreen SPF 30+ to sun-exposed areas, reapply every 2 hours as needed.  - Call if any new or changing lesions are noted between office visits   HISTORY OF SQUAMOUS CELL CARCINOMA IN SITU OF THE SKIN - No evidence of recurrence today - Recommend regular full body skin exams - Recommend daily broad spectrum sunscreen SPF  30+ to sun-exposed areas, reapply every 2 hours as needed.  - Call if any new or changing lesions are noted between office visits   HISTORY OF DYSPLASTIC NEVUS No evidence of recurrence today Recommend regular full body skin exams Recommend daily broad spectrum sunscreen SPF 30+ to sun-exposed areas, reapply every 2 hours as needed.  Call if any new or changing  lesions are noted between office visits  ATOPIC DERMATITIS Exam: Pink scaly patches at the forehead.   Chronic and persistent condition with duration or expected duration over one year. Condition is improving with treatment but not currently at goal.    Atopic dermatitis (eczema) is a chronic, relapsing, pruritic condition that can significantly affect quality of life. It is often associated with allergic rhinitis and/or asthma and can require treatment with topical medications, phototherapy, or in severe cases biologic injectable medication (Dupixent; Adbry) or Oral JAK inhibitors.   Treatment Plan: Opzelura  cream to aa's every day/bid PRN.  Patient has tried and failed Eucrisa  and HC 2.5% cream.    Recommend gentle skin care.   ROSACEA Exam Mid face erythema with telangiectasias   Chronic and persistent condition with duration or expected duration over one year. Condition is symptomatic/ bothersome to patient. Not currently at goal.   Rosacea is a chronic progressive skin condition usually affecting the face of adults, causing redness and/or acne bumps. It is treatable but not curable. It sometimes affects the eyes (ocular rosacea) as well. It may respond to topical and/or systemic medication and can flare with stress, sun exposure, alcohol, exercise, topical steroids (including hydrocortisone /cortisone 10) and some foods.  Daily application of broad spectrum spf 30+ sunscreen to face is recommended to reduce flares.  Treatment Plan Continue SM50 Oxymetazoline  1% / Ivermectin  1% / Niacinamide 2% Cream apply QAM, as needed.   Xerosis - diffuse xerotic patches feet - recommend gentle, hydrating skin care - gentle skin care handout given Recommend starting moisturizer with exfoliant (Urea, Salicylic acid, or Lactic acid) one to two times daily to help smooth rough and bumpy skin.  OTC options include Cetaphil Rough and Bumpy lotion (Urea), Eucerin Roughness Relief lotion or spot  treatment cream (Urea), CeraVe SA lotion/cream for Rough and Bumpy skin (Sal Acid), Gold Bond Rough and Bumpy cream (Sal Acid), and AmLactin 12% lotion/cream (Lactic Acid).  If applying in morning, also apply sunscreen to sun-exposed areas, since these exfoliating moisturizers can increase sensitivity to sun.  NEOPLASM OF UNCERTAIN BEHAVIOR OF SKIN Right palmar 4th finger Epidermal / dermal shaving  Lesion diameter (cm):  0.6 Informed consent: discussed and consent obtained   Patient was prepped and draped in usual sterile fashion: Area prepped with alcohol. Anesthesia: the lesion was anesthetized in a standard fashion   Anesthetic:  1% lidocaine  w/ epinephrine  1-100,000 buffered w/ 8.4% NaHCO3 Instrument used: flexible razor blade   Hemostasis achieved with: pressure, aluminum chloride and electrodesiccation   Outcome: patient tolerated procedure well   Post-procedure details: wound care instructions given   Post-procedure details comment:  Ointment and small bandage applied  Specimen 1 - Surgical pathology Differential Diagnosis: Irritated Wart vs Pyogenic Granuloma Check Margins: No INFLAMED SEBORRHEIC KERATOSIS (7) R forehead x 1, L chin x 1, R temple x 1, R zygoma x 1, L infraocular x 1, L abdomen x 1, R spinal mid back x 1 (7) Symptomatic, irritating, patient would like treated. Destruction of lesion - R forehead x 1, L chin x 1, R temple x 1, R zygoma x 1, L  infraocular x 1, L abdomen x 1, R spinal mid back x 1 (7)  Destruction method: cryotherapy   Informed consent: discussed and consent obtained   Lesion destroyed using liquid nitrogen: Yes   Region frozen until ice ball extended beyond lesion: Yes   Outcome: patient tolerated procedure well with no complications   Post-procedure details: wound care instructions given   Additional details:  Prior to procedure, discussed risks of blister formation, small wound, skin dyspigmentation, or rare scar following cryotherapy. Recommend  Vaseline ointment to treated areas while healing.   Return in about 1 year (around 09/12/2025) for TBSE, Hx SCC, Hx BCC, Hx Dysplastic Nevus.  IAndrea Kerns, CMA, am acting as scribe for Rexene Rattler, MD .   Documentation: I have reviewed the above documentation for accuracy and completeness, and I agree with the above.  Rexene Rattler, MD

## 2024-09-12 NOTE — Patient Instructions (Addendum)
 pros[facility]  Wound Care Instructions  Cleanse wound gently with soap and water once a day then pat dry with clean gauze. Apply a thin coat of Petrolatum (petroleum jelly, Vaseline) over the wound (unless you have an allergy  to this). We recommend that you use a new, sterile tube of Vaseline. Do not pick or remove scabs. Do not remove the yellow or white healing tissue from the base of the wound.  Cover the wound with fresh, clean, nonstick gauze and secure with paper tape. You may use Band-Aids in place of gauze and tape if the wound is small enough, but would recommend trimming much of the tape off as there is often too much. Sometimes Band-Aids can irritate the skin.  You should call the office for your biopsy report after 1 week if you have not already been contacted.  If you experience any problems, such as abnormal amounts of bleeding, swelling, significant bruising, significant pain, or evidence of infection, please call the office immediately.  FOR ADULT SURGERY PATIENTS: If you need something for pain relief you may take 1 extra strength Tylenol  (acetaminophen ) AND 2 Ibuprofen (200mg  each) together every 4 hours as needed for pain. (do not take these if you are allergic to them or if you have a reason you should not take them.) Typically, you may only need pain medication for 1 to 3 days.     Recommend starting moisturizer with exfoliant (Urea, Salicylic acid, or Lactic acid) one to two times daily to help smooth rough and bumpy skin.  OTC options include Cetaphil Rough and Bumpy lotion (Urea), Eucerin Roughness Relief lotion or spot treatment cream (Urea), CeraVe SA lotion/cream for Rough and Bumpy skin (Sal Acid), Gold Bond Rough and Bumpy cream (Sal Acid), and AmLactin 12% lotion/cream (Lactic Acid).  If applying in morning, also apply sunscreen to sun-exposed areas, since these exfoliating moisturizers can increase sensitivity to sun.  Cryotherapy Aftercare  Wash gently with soap and  water everyday.   Apply Vaseline and Band-Aid daily until healed.    Due to recent changes in healthcare laws, you may see results of your pathology and/or laboratory studies on MyChart before the doctors have had a chance to review them. We understand that in some cases there may be results that are confusing or concerning to you. Please understand that not all results are received at the same time and often the doctors may need to interpret multiple results in order to provide you with the best plan of care or course of treatment. Therefore, we ask that you please give us  2 business days to thoroughly review all your results before contacting the office for clarification. Should we see a critical lab result, you will be contacted sooner.   If You Need Anything After Your Visit  If you have any questions or concerns for your doctor, please call our main line at [phone] and press option 4 to reach your doctor's medical assistant. If no one answers, please leave a voicemail as directed and we will return your call as soon as possible. Messages left after 4 pm will be answered the following business day.   You may also send us  a message via MyChart. We typically respond to MyChart messages within 1-2 business days.  For prescription refills, please ask your pharmacy to contact our office. Our fax number is [phone].  If you have an urgent issue when the clinic is closed that cannot wait until the next business day, you can page your doctor  at the number below.    Please note that while we do our best to be available for urgent issues outside of office hours, we are not available 24/7.   If you have an urgent issue and are unable to reach us , you may choose to seek medical care at your doctor's office, retail clinic, urgent care center, or emergency room.  If you have a medical emergency, please immediately call 911 or go to the emergency department.  Pager Numbers  - Dr. Hester:  [phone]  - Dr. Jackquline: [phone]  - Dr. Claudene: [phone]   - Dr. Raymund: [phone]  In the event of inclement weather, please call our main line at [phone] for an update on the status of any delays or closures.  Dermatology Medication Tips: Please keep the boxes that topical medications come in in order to help keep track of the instructions about where and how to use these. Pharmacies typically print the medication instructions only on the boxes and not directly on the medication tubes.   If your medication is too expensive, please contact our office at [phone] option 4 or send us  a message through MyChart.   We are unable to tell what your co-pay for medications will be in advance as this is different depending on your insurance coverage. However, we may be able to find a substitute medication at lower cost or fill out paperwork to get insurance to cover a needed medication.   If a prior authorization is required to get your medication covered by your insurance company, please allow us  1-2 business days to complete this process.  Drug prices often vary depending on where the prescription is filled and some pharmacies may offer cheaper prices.  The website www.goodrx.com contains coupons for medications through different pharmacies. The prices here do not account for what the cost may be with help from insurance (it may be cheaper with your insurance), but the website can give you the price if you did not use any insurance.  - You can print the associated coupon and take it with your prescription to the pharmacy.  - You may also stop by our office during regular business hours and pick up a GoodRx coupon card.  - If you need your prescription sent electronically to a different pharmacy, notify our office through [facility] or by phone at [phone] option 4.     Si Usted Necesita Algo Despus de Su Visita  Tambin puede enviarnos un mensaje a travs  de Clinical Cytogeneticist. Por lo general respondemos a los mensajes de MyChart en el transcurso de 1 a 2 das hbiles.  Para renovar recetas, por favor pida a su farmacia que se ponga en contacto con nuestra oficina. Randi lakes de fax es [place] [phone].  Si tiene un asunto urgente cuando la clnica est cerrada y que no puede esperar hasta el siguiente da hbil, puede llamar/localizar a su doctor(a) al nmero que aparece a continuacin.   Por favor, tenga en cuenta que aunque hacemos todo lo posible para estar disponibles para asuntos urgentes fuera del horario de [place], no estamos disponibles las 24 horas del da, los 7 [address]  [address] de la [place].   Si tiene un problema urgente y no puede comunicarse con nosotros, puede optar por buscar atencin mdica  en el consultorio de su doctor(a), en una clnica privada, en un centro de atencin urgente o en una sala de emergencias.  Si tiene una emergencia mdica, por favor llame inmediatamente al 911 o vaya  a la sala de sports administrator.  Nmeros de bper  - Dr. Hester: [phone]  - Dra. Jackquline: [phone]  - Dr. Claudene: [phone]  - Dra. Kitts: [phone]  En caso de inclemencias del [place], por favor llame a nuestra lnea principal al [phone] para una actualizacin sobre el estado de cualquier retraso o cierre.  Consejos para la medicacin en dermatologa: Por favor, guarde las cajas en las que vienen los medicamentos de uso tpico para ayudarle a seguir las instrucciones sobre dnde y cmo usarlos. Las farmacias generalmente imprimen las instrucciones del medicamento slo en las cajas y no directamente en los tubos del [place].   Si su medicamento es muy caro, por favor, pngase en contacto con landry rieger llamando al [phone] y presione la opcin 4 o envenos un mensaje a travs de Clinical Cytogeneticist.   No podemos decirle cul ser su copago por los medicamentos por adelantado ya que esto es diferente dependiendo de la cobertura de su seguro.  Sin embargo, es posible que podamos encontrar un medicamento sustituto a audiological scientist un formulario para que el seguro cubra el medicamento que se considera necesario.   Si se requiere una autorizacin previa para que su compaa de seguros cubra su medicamento, por favor permtanos de 1 a 2 das hbiles para completar este proceso.  Los precios de los medicamentos varan con frecuencia dependiendo del environmental consultant de dnde se surte la receta y alguna farmacias pueden ofrecer precios ms baratos.  El sitio web www.goodrx.com tiene cupones para medicamentos de health and safety inspector. Los precios aqu no tienen en cuenta lo que podra costar con la ayuda del seguro (puede ser ms barato con su seguro), pero el sitio web puede darle el precio si no utiliz tourist information centre manager.  - Puede imprimir el cupn correspondiente y llevarlo con su receta a la farmacia.  - Tambin puede pasar por nuestra oficina durante el horario de atencin regular y education officer, museum una tarjeta de cupones de GoodRx.  - Si necesita que su receta se enve electrnicamente a una farmacia diferente, informe a nuestra oficina a travs de MyChart de [place] o por telfono llamando al [phone] y presione la opcin 4.

## 2024-09-14 LAB — SURGICAL PATHOLOGY

## 2024-09-17 ENCOUNTER — Ambulatory Visit: Payer: Self-pay | Admitting: Dermatology

## 2024-09-17 NOTE — Telephone Encounter (Signed)
-----   Message from Rexene Rattler sent at 09/17/2024 11:17 AM EST ----- 1. 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

## 2024-09-17 NOTE — Telephone Encounter (Signed)
 Advised pt of bx results/sh ?

## 2024-09-18 NOTE — Patient Instructions (Signed)
 Preventive Care 58-58 Years Old, Female  Preventive care refers to lifestyle choices and visits with your health care provider that can promote health and wellness. Preventive care visits are also called wellness exams.  What can I expect for my preventive care visit?  Counseling  Your health care provider may ask you questions about your:  Medical history, including:  Past medical problems.  Family medical history.  Pregnancy history.  Current health, including:  Menstrual cycle.  Method of birth control.  Emotional well-being.  Home life and relationship well-being.  Sexual activity and sexual health.  Lifestyle, including:  Alcohol, nicotine or tobacco, and drug use.  Access to firearms.  Diet, exercise, and sleep habits.  Work and work Astronomer.  Sunscreen use.  Safety issues such as seatbelt and bike helmet use.  Physical exam  Your health care provider will check your:  Height and weight. These may be used to calculate your BMI (body mass index). BMI is a measurement that tells if you are at a healthy weight.  Waist circumference. This measures the distance around your waistline. This measurement also tells if you are at a healthy weight and may help predict your risk of certain diseases, such as type 2 diabetes and high blood pressure.  Heart rate and blood pressure.  Body temperature.  Skin for abnormal spots.  What immunizations do I need?    Vaccines are usually given at various ages, according to a schedule. Your health care provider will recommend vaccines for you based on your age, medical history, and lifestyle or other factors, such as travel or where you work.  What tests do I need?  Screening  Your health care provider may recommend screening tests for certain conditions. This may include:  Lipid and cholesterol levels.  Diabetes screening. This is done by checking your blood sugar (glucose) after you have not eaten for a while (fasting).  Pelvic exam and Pap test.  Hepatitis B test.  Hepatitis C  test.  HIV (human immunodeficiency virus) test.  STI (sexually transmitted infection) testing, if you are at risk.  Lung cancer screening.  Colorectal cancer screening.  Mammogram. Talk with your health care provider about when you should start having regular mammograms. This may depend on whether you have a family history of breast cancer.  BRCA-related cancer screening. This may be done if you have a family history of breast, ovarian, tubal, or peritoneal cancers.  Bone density scan. This is done to screen for osteoporosis.  Talk with your health care provider about your test results, treatment options, and if necessary, the need for more tests.  Follow these instructions at home:  Eating and drinking    Eat a diet that includes fresh fruits and vegetables, whole grains, lean protein, and low-fat dairy products.  Take vitamin and mineral supplements as recommended by your health care provider.  Do not drink alcohol if:  Your health care provider tells you not to drink.  You are pregnant, may be pregnant, or are planning to become pregnant.  If you drink alcohol:  Limit how much you have to 0-1 drink a day.  Know how much alcohol is in your drink. In the [place] one drink equals one 12 oz bottle of beer (355 mL), one 5 oz glass of wine (148 mL), or one 1 oz glass of hard liquor (44 mL).  Lifestyle  Brush your teeth every morning and night with fluoride toothpaste. Floss one time each day.  Exercise for at least  30 minutes 5 or more days each week.  Do not use any products that contain nicotine or tobacco. These products include cigarettes, chewing tobacco, and vaping devices, such as e-cigarettes. If you need help quitting, ask your health care provider.  Do not use drugs.  If you are sexually active, practice safe sex. Use a condom or other form of protection to prevent STIs.  If you do not wish to become pregnant, use a form of birth control. If you plan to become pregnant, see your health care provider for a  prepregnancy visit.  Take aspirin only as told by your health care provider. Make sure that you understand how much to take and what form to take. Work with your health care provider to find out whether it is safe and beneficial for you to take aspirin daily.  Find healthy ways to manage stress, such as:  Meditation, yoga, or listening to music.  Journaling.  Talking to a trusted person.  Spending time with friends and family.  Minimize exposure to UV radiation to reduce your risk of skin cancer.  Safety  Always wear your seat belt while driving or riding in a vehicle.  Do not drive:  If you have been drinking alcohol. Do not ride with someone who has been drinking.  When you are tired or distracted.  While texting.  If you have been using any mind-altering substances or drugs.  Wear a helmet and other protective equipment during sports activities.  If you have firearms in your house, make sure you follow all gun safety procedures.  Seek help if you have been physically or sexually abused.  What's next?  Visit your health care provider once a year for an annual wellness visit.  Ask your health care provider how often you should have your eyes and teeth checked.  Stay up to date on all vaccines.  This information is not intended to replace advice given to you by your health care provider. Make sure you discuss any questions you have with your health care provider.  Document Revised: 04/29/2021 Document Reviewed: 04/29/2021  Elsevier Patient Education  2024 [employer].

## 2024-09-19 ENCOUNTER — Ambulatory Visit: Admitting: Family Medicine

## 2024-09-19 ENCOUNTER — Encounter: Payer: Self-pay | Admitting: Family Medicine

## 2024-09-19 VITALS — BP 128/74 | HR 84 | Resp 16 | Ht 63.0 in | Wt 181.0 lb

## 2024-09-19 DIAGNOSIS — M150 Primary generalized (osteo)arthritis: Secondary | ICD-10-CM

## 2024-09-19 DIAGNOSIS — M5442 Lumbago with sciatica, left side: Secondary | ICD-10-CM

## 2024-09-19 DIAGNOSIS — E1159 Type 2 diabetes mellitus with other circulatory complications: Secondary | ICD-10-CM

## 2024-09-19 DIAGNOSIS — E1169 Type 2 diabetes mellitus with other specified complication: Secondary | ICD-10-CM

## 2024-09-19 DIAGNOSIS — I152 Hypertension secondary to endocrine disorders: Secondary | ICD-10-CM

## 2024-09-19 DIAGNOSIS — M359 Systemic involvement of connective tissue, unspecified: Secondary | ICD-10-CM

## 2024-09-19 DIAGNOSIS — Z0001 Encounter for general adult medical examination with abnormal findings: Secondary | ICD-10-CM | POA: Diagnosis not present

## 2024-09-19 DIAGNOSIS — Z1211 Encounter for screening for malignant neoplasm of colon: Secondary | ICD-10-CM

## 2024-09-19 DIAGNOSIS — K219 Gastro-esophageal reflux disease without esophagitis: Secondary | ICD-10-CM | POA: Diagnosis not present

## 2024-09-19 DIAGNOSIS — J3089 Other allergic rhinitis: Secondary | ICD-10-CM

## 2024-09-19 DIAGNOSIS — J4520 Mild intermittent asthma, uncomplicated: Secondary | ICD-10-CM

## 2024-09-19 DIAGNOSIS — M797 Fibromyalgia: Secondary | ICD-10-CM | POA: Diagnosis not present

## 2024-09-19 DIAGNOSIS — G8929 Other chronic pain: Secondary | ICD-10-CM

## 2024-09-19 DIAGNOSIS — F339 Major depressive disorder, recurrent, unspecified: Secondary | ICD-10-CM

## 2024-09-19 DIAGNOSIS — Z0000 Encounter for general adult medical examination without abnormal findings: Secondary | ICD-10-CM

## 2024-09-19 DIAGNOSIS — J302 Other seasonal allergic rhinitis: Secondary | ICD-10-CM

## 2024-09-19 MED ORDER — BUDESONIDE-FORMOTEROL FUMARATE 160-4.5 MCG/ACT IN AERO: 2.0000 | INHALATION_SPRAY | Freq: Two times a day (BID) | RESPIRATORY_TRACT | 3 refills | Status: AC

## 2024-09-19 MED ORDER — MONTELUKAST SODIUM 10 MG PO TABS: 10.0000 mg | ORAL_TABLET | Freq: Every day | ORAL | 1 refills | Status: AC

## 2024-09-19 MED ORDER — DULOXETINE HCL 30 MG PO CPEP: 30.0000 mg | ORAL_CAPSULE | Freq: Every day | ORAL | 1 refills | Status: AC

## 2024-09-19 MED ORDER — IBUPROFEN 600 MG PO TABS: 600.0000 mg | ORAL_TABLET | Freq: Three times a day (TID) | ORAL | 1 refills | Status: AC | PRN

## 2024-09-19 MED ORDER — OMEPRAZOLE 20 MG PO CPDR: 20.0000 mg | DELAYED_RELEASE_CAPSULE | Freq: Every morning | ORAL | 1 refills | Status: AC

## 2024-09-19 MED ORDER — LEVOCETIRIZINE DIHYDROCHLORIDE 5 MG PO TABS: 5.0000 mg | ORAL_TABLET | Freq: Every evening | ORAL | 1 refills | Status: AC

## 2024-09-19 MED ORDER — OLMESARTAN MEDOXOMIL 40 MG PO TABS: 40.0000 mg | ORAL_TABLET | Freq: Every day | ORAL | 1 refills | Status: AC

## 2024-09-19 NOTE — Progress Notes (Signed)
 Name: Yolanda Pace   MRN: [id number]    DOB: 07/03/1966   Date:09/19/2024       Progress Note  Subjective  Chief Complaint  Chief Complaint  Patient presents with   Annual Exam    Pt refused breast exam    HPI  Patient presents for annual CPE split visit .  Discussed the use of AI scribe software for clinical note transcription with the patient, who gave verbal consent to proceed.  History of Present Illness Yolanda Pace is a 58 year old female who presents for a physical exam and follow-up on multiple health issues.  She has mixed urinary incontinence and an overactive bladder. She was prescribed medication but has not started it due to apprehension. She completed a course of antibiotics for recurrent urinary tract infections and has been advised to use vaginal cream for atrophy and dryness, which she has not yet started.  She has a history of polyps found during a previous colonoscopy and is reluctant to undergo another due to a negative past experience. She is willing to try Cologuard as an alternative screening method.  She has back issues, including a slipped and ruptured disc. She reports that her most recent MRI was done last month at [facility]. She experiences pain radiating from her back to her legs. She previously tried gabapentin but discontinued it due to side effects. She is currently on duloxetine .  She has type 2 diabetes, initially diagnosed with an A1c of 6.7% in August of last year, which improved to 6.4% in March. She was prescribed Rybelsus  but has not started it. She manages her blood pressure with olmesartan  and has discontinued amlodipine . She is not currently taking medication for dyslipidemia, preferring dietary management.  She reports a stable social situation, living with her husband who recently retired but is returning to work. She has a supportive relationship with her daughter and granddaughter and is active in her church community. No issues with housing,  transportation, or utilities.  No postmenopausal bleeding or recent sexual activity. She experiences seasonal allergies and takes Xyzal  and montelukast  for management. She has a history of asthma but does not currently require albuterol .      Diet: currently eating more since husband has been home and cooking  Exercise: discussed regular physical activity  Last Eye Exam: completed Last Dental Exam: completed  Flowsheet Row Office Visit from 09/19/2024 in [facility]  AUDIT-C Score 0   Depression: Phq 9 is  negative    09/19/2024    8:56 AM 06/07/2024    9:08 AM 01/24/2024    8:55 AM 07/25/2023    9:28 AM 06/27/2023    9:13 AM  Depression screen PHQ 2/9  Decreased Interest 0 0 1 2 1   Down, Depressed, Hopeless 0 0 1 2 2   PHQ - 2 Score 0 0 2 4 3   Altered sleeping 0 1 1  1   Tired, decreased energy 0 0 1  1  Change in appetite 0 1 1  1   Feeling bad or failure about yourself  0 0 1  3  Trouble concentrating 0 0 0  0  Moving slowly or fidgety/restless 0 0 0  0  Suicidal thoughts 0 0 0 1 0  PHQ-9 Score 0 2 6  9   Difficult doing work/chores Not difficult at all  Very difficult     Hypertension: BP Readings from Last 3 Encounters:  09/19/24 128/74  06/07/24 128/76  01/24/24 (!) 148/96  Obesity: Wt Readings from Last 3 Encounters:  09/19/24 181 lb (82.1 kg)  06/07/24 181 lb (82.1 kg)  01/24/24 181 lb 1.6 oz (82.1 kg)   BMI Readings from Last 3 Encounters:  09/19/24 32.06 kg/m  06/07/24 32.06 kg/m  01/24/24 32.08 kg/m     Vaccines: reviewed with the patient.   Hep C Screening: completed STD testing and prevention (HIV/chl/gon/syphilis): N/A Intimate partner violence: negative screen  Sexual History : not in a while Menstrual History/LMP/Abnormal Bleeding:  Discussed importance of follow up if any post-menopausal bleeding: not applicable  Incontinence Symptoms: positive for symptoms , seen by urogynecologist   Breast cancer:  - Last  Mammogram: up to date  - BRCA gene screening: N/A  Osteoporosis Prevention : Discussed high calcium  and vitamin D  supplementation, weight bearing exercises Bone density :yes , we will order it for next year   Cervical cancer screening: not applicable due to hysterectomy  Skin cancer: Discussed monitoring for atypical lesions  Colorectal cancer: refuses colonoscopy, has a history of polyps but willing to try cologuard and if positive she will go for colonoscopy    Lung cancer:  Low Dose CT Chest recommended if Age 58-80 years, 20 pack-year currently smoking OR have quit w/in 15years. Patient does not qualify for screen   ECG: 08/2021  Advanced Care Planning: A voluntary discussion about advance care planning including the explanation and discussion of advance directives.  Discussed health care proxy and Living will, and the patient was able to identify a health care proxy as husband .  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/2022   Major depression, recurrent, chronic 04/08/2022   Symptomatic varicose veins, left 03/19/2022   Palpitations 07/13/2021   Diabetes mellitus with microalbuminuria (HCC) 02/23/2021   Fibromyalgia 02/20/2021   Connective tissue disease 10/31/2020   Chronic shoulder pain 07/07/2020   Mild intermittent asthma without complication 07/07/2020   Traumatic incomplete tear of left rotator cuff 04/23/2020   Arthralgia 08/03/2019   Gastric polyp    Benign neoplasm of transverse colon    Internal hemorrhoids    Malignant neoplasm of skin 06/19/2018   Chronic pain syndrome 06/19/2018   Cyclic citrullinated peptide (CCP) antibody  positive 05/12/2018   Kidney stone 02/20/2018   Renal lesion 02/20/2018   Benign hypertension 01/21/2018   Pelvic pain 12/26/2017   GERD (gastroesophageal reflux disease) 11/09/2017   Elevated beta-2 microglobulin 07/11/2017   Right thyroid  nodule 05/26/2017   Mild hypercholesterolemia 05/06/2017   Generalized osteoarthritis of hand 12/13/2016   Impingement syndrome of shoulder region, left 11/29/2016   Anxiety    Allergic rhinitis    Vitamin D  deficiency disease     Past Surgical History:  Procedure Laterality Date   ABDOMINAL HYSTERECTOMY  2005ish   fibroids, ovaries remain; along with bladder tack   BACK SURGERY     Lower back   bladder sling mesh removal  08/2018   bladder tack  2005ish   CHOLECYSTECTOMY     COLONOSCOPY WITH PROPOFOL  N/A 06/22/2018   Procedure: COLONOSCOPY WITH PROPOFOL ;  Surgeon: Janalyn Keene NOVAK, MD;  Location: [facility] ENDOSCOPY;  Service: Endoscopy;  Laterality: N/A;   CYSTO WITH HYDRODISTENSION N/A  05/02/2018   Procedure: CYSTOSCOPY/HYDRODISTENSION;  Surgeon: Kassie Ozell SAUNDERS, MD;  Location: [facility] ORS;  Service: Urology;  Laterality: N/A;   ESOPHAGOGASTRODUODENOSCOPY (EGD) WITH PROPOFOL  N/A 06/22/2018   Procedure: ESOPHAGOGASTRODUODENOSCOPY (EGD) WITH PROPOFOL ;  Surgeon: Janalyn Keene NOVAK, MD;  Location: [facility] ENDOSCOPY;  Service: Endoscopy;  Laterality: N/A;   HEMI-MICRODISCECTOMY LUMBAR LAMINECTOMY LEVEL 1 Left 04/27/2021   Procedure: LEFT L4-5 HEMILAMINECTOMY;  Surgeon: Bluford Standing, MD;  Location: [facility] ORS;  Service: Neurosurgery;  Laterality: Left;   INCONTINENCE SURGERY     ROTATOR CUFF REPAIR Left 01/08/2019   rotator cuff revision Left 12/06/2019   SKIN CANCER EXCISION  06/2014   removed from back    Family History  Problem Relation Age of Onset   Hyperlipidemia Mother    Diabetes Sister    Hyperlipidemia Sister    Hypertension Sister    Hypertension Brother    Hypertension Son    Kidney Stones Son    Pneumonia Maternal Grandmother     Heart attack Maternal Grandfather    Hypertension Sister    Epilepsy Sister    Hypertension Sister    Hypertension Daughter    Kidney Stones Daughter    Cancer Neg Hx    COPD Neg Hx    Stroke Neg Hx     Social History   Socioeconomic History   Marital status: Married    Spouse name: Not on file   Number of children: 3   Years of education: Not on file   Highest education level: Some college, no degree  Occupational History   Not on file  Tobacco Use   Smoking status: Never   Smokeless tobacco: Never  Vaping Use   Vaping status: Never Used  Substance and Sexual Activity   Alcohol use: Not Currently    Comment: occ   Drug use: No   Sexual activity: Not Currently  Other Topics Concern   Not on file  Social History Narrative   Not on file   Social Drivers of Health   Financial Resource Strain: Low Risk  (09/19/2024)   Overall Financial Resource Strain (CARDIA)    Difficulty of Paying Living Expenses: Not hard at all  Food Insecurity: No Food Insecurity (09/19/2024)   Hunger Vital Sign    Worried About Running Out of Food in the Last Year: Never true    Ran Out of Food in the Last Year: Never true  Transportation Needs: No Transportation Needs (09/19/2024)   PRAPARE - Administrator, Civil Service (Medical): No    Lack of Transportation (Non-Medical): No  Physical Activity: Insufficiently Active (09/19/2024)   Exercise Vital Sign    Days of Exercise per Week: 3 days    Minutes of Exercise per Session: 30 min  Stress: Stress Concern Present (09/19/2024)   [employer] of Occupational Health - Occupational Stress Questionnaire    Feeling of Stress: To some extent  Social Connections: Socially Integrated (09/19/2024)   Social Connection and Isolation Panel    Frequency of Communication with Friends and Family: More than three times a week    Frequency of Social Gatherings with Friends and Family: More than three times a week    Attends Religious  Services: More than 4 times per year    Active Member of [employer] or Organizations: Yes    Attends Banker Meetings: 1 to 4 times per year    Marital Status: Married  Catering Manager Violence: Not At Risk (09/19/2024)   Humiliation, Afraid, Rape,  and Kick questionnaire    Fear of Current or Ex-Partner: No    Emotionally Abused: No    Physically Abused: No    Sexually Abused: No     Current Outpatient Medications:    acetaminophen  (TYLENOL ) 500 MG tablet, Take 1 tablet (500 mg total) by mouth every 6 (six) hours as needed for moderate pain (pain score 4-6)., Disp: 120 tablet, Rfl: 1   albuterol  (PROVENTIL ) (2.5 MG/3ML) 0.083% nebulizer solution, Take 3 mLs (2.5 mg total) by nebulization every 4 (four) hours as needed for wheezing or shortness of breath., Disp: 75 mL, Rfl: 1   blood glucose meter kit and supplies, Dispense based on patient and insurance preference. Use up to four times daily as directed. (FOR ICD-10 E10.9, E11.9)., Disp: 1 each, Rfl: 0   Cholecalciferol (VITAMIN D ) 2000 units tablet, Take 2,000 Units by mouth daily. , Disp: , Rfl:    Crisaborole  (EUCRISA ) 2 % OINT, Apply twice daily to affected areas on face, Disp: 60 g, Rfl: 1   hydrocortisone  2.5 % cream, Apply 1-2 times daily for up to 1 week to affected areas of rash., Disp: 30 g, Rfl: 1   ibuprofen (ADVIL) 600 MG tablet, Take 1 tablet (600 mg total) by mouth every 8 (eight) hours as needed., Disp: 90 tablet, Rfl: 1   Ivermectin  1 % CREA, Apply compounded mix to face QAM., Disp: 30 g, Rfl: 11   ketoconazole  (NIZORAL ) 2 % cream, Apply twice daily to affected areas on face., Disp: 30 g, Rfl: 2   Roflumilast  (ZORYVE ) 0.3 % CREA, Apply to affected areas face once daily until improved., Disp: 60 g, Rfl: 1   Ruxolitinib Phosphate  (OPZELURA ) 1.5 % CREA, APPLY TO AFFECTED AREAS OF ECZEMA ONCE DAILY AS NEEDED. 60 gm for 30 day supply, Disp: 60 g, Rfl: 1   Semaglutide  (RYBELSUS ) 3 MG TABS, Take 1 tablet (3 mg total) by  mouth daily., Disp: 30 tablet, Rfl: 0   budesonide -formoterol  (SYMBICORT ) 160-4.5 MCG/ACT inhaler, Inhale 2 puffs into the lungs 2 (two) times daily., Disp: 1 each, Rfl: 3   DULoxetine  (CYMBALTA ) 30 MG capsule, Take 1 capsule (30 mg total) by mouth daily., Disp: 90 capsule, Rfl: 1   estradiol (ESTRACE) 0.01 % CREA vaginal cream, Insert fingertip unit amount (0.5) into the vagina and around the urethra nightly x 2 weeks, then 2-3 times weekly for maintenance (Patient not taking: Reported on 09/19/2024), Disp: , Rfl:    levocetirizine (XYZAL ) 5 MG tablet, Take 1 tablet (5 mg total) by mouth every evening., Disp: 90 tablet, Rfl: 1   montelukast  (SINGULAIR ) 10 MG tablet, Take 1 tablet (10 mg total) by mouth at bedtime., Disp: 90 tablet, Rfl: 1   olmesartan  (BENICAR ) 40 MG tablet, Take 1 tablet (40 mg total) by mouth daily., Disp: 90 tablet, Rfl: 1   omeprazole  (PRILOSEC) 20 MG capsule, Take 1 capsule (20 mg total) by mouth every morning., Disp: 90 capsule, Rfl: 1  Allergies  Allergen Reactions   Betadine [Povidone Iodine] Swelling, Dermatitis and Rash    Pt states site gets infected   Metrizamide Swelling    Noted in [facility] chart on 01/06/2012. Premedicate patient prior to contrast media injections.   Other     Mescalor: vomiting    Salicylic Acid-Sulfur     Burst eye vessels   Bee Pollen     Sneezing, watery eyes, coughing.    Meloxicam  Itching   Pollen Extract Other (See Comments)    Sneezing, watery eyes,  coughing.    Sulindac Anxiety and Other (See Comments)   Compazine  [Prochlorperazine] Other (See Comments)    Jaws locked up   Contrast Media [Iodinated Contrast Media] Swelling    Noted in [facility] chart on 01/06/2012. Premedicate patient prior to contrast media injections.   Doxycycline Nausea Only    GI upset   Gabapentin Nausea And Vomiting    Lip numbness    Keppra  [Levetiracetam ]     Pt was not herself    Lyrica  [Pregabalin ]     Tingling, depression, blister mouth     Macrobid  [Nitrofurantoin ] Other (See Comments)    Chest tightness, chills, nausea   Rosuvastatin      Mental fogginess, confusion, fatigue    Codeine Nausea And Vomiting   Hydroxychloroquine Nausea And Vomiting and Nausea Only    Ear clicking   Iodine Swelling   Sulfa  Antibiotics Nausea And Vomiting and Other (See Comments)     ROS  Ten systems reviewed and is negative except as mentioned in HPI    Objective  Vitals:   09/19/24 0859  BP: 128/74  Pulse: 84  Resp: 16  SpO2: 95%  Weight: 181 lb (82.1 kg)  Height: 5' 3 (1.6 m)    Body mass index is 32.06 kg/m.  Physical Exam  Constitutional: Patient appears well-developed and well-nourished. No distress.  HENT: Head: Normocephalic and atraumatic. Ears: B TMs ok, no erythema or effusion; Nose: Nose normal. Mouth/Throat: Oropharynx is clear and moist. No oropharyngeal exudate.  Eyes: Conjunctivae and EOM are normal. Pupils are equal, round, and reactive to light. No scleral icterus.  Neck: Normal range of motion. Neck supple. No JVD present. No thyromegaly present.  Cardiovascular: Normal rate, regular rhythm and normal heart sounds.  No murmur heard. No BLE edema. Pulmonary/Chest: Effort normal and breath sounds normal. No respiratory distress. Abdominal: Soft. Bowel sounds are normal, no distension. There is no tenderness. no masses Breast: not interested  FEMALE GENITALIA:  Not done  RECTAL: not done  Musculoskeletal: Normal range of motion, no joint effusions. No gross deformities Neurological: he is alert and oriented to person, place, and time. No cranial nerve deficit. Coordination, balance, strength, speech and gait are normal.  Skin: Skin is warm and dry. No rash noted. No erythema.  Psychiatric: Patient has a normal mood and affect. behavior is normal. Judgment and thought content normal.      Assessment & Plan Adult Wellness Visit Routine wellness visit with health maintenance discussion. Up to date with  mammogram and eye exam. Declined colonoscopy, agreed to Cologuard. Discussed bone density screening with mammogram next year. - Scheduled follow-up in six months for diabetes management. - Ordered Cologuard for colon cancer screening. - Plan for bone density screening with mammogram next year.  Type 2 diabetes mellitus with hypertension and dyslipidemia Type 2 diabetes with A1c improved to 6.4. Hypertension controlled with olmesartan . Dyslipidemia managed with diet. Discussed Rybelsus  for weight loss and diabetes management. - Ordered A1c test. - Prescribed Rybelsus  3 mg. - Continue olmesartan  40 mg. - Discontinued amlodipine . - Ordered lipid panel. - Ordered comprehensive metabolic panel. - Ordered urine microalbumin.  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 Gabapentin discontinued due to side effects. Duloxetine  used for pain and depression. Physical therapy recommended. - Continue duloxetine . - Prescribed ibuprofen 600 mg as needed. - Recommended physical therapy for strengthening and stabilization. - Provided handicap placard for mobility assistance.  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 Left knee worse, but right knee has more pain. Physical therapy recommended. - Recommended physical therapy for knee strengthening. - Prescribed ibuprofen 600 mg as needed.  Major depressive disorder, recurrent Major depressive disorder managed with duloxetine  30 mg. PHQ-9 score improved. She reports feeling better but not completely well. - Continue duloxetine  30 mg.  Asthma, mild intermittent and allergic rhinitis Mild intermittent asthma and allergic rhinitis managed with Xyzal  and montelukast . No current need for albuterol . - Continue Xyzal  and montelukast .  Urinary incontinence, mixed type Mixed urinary  incontinence diagnosed as overactive bladder. Medication not started due to fear of side effects. Discussed vaginal estrogen cream benefits. - Encouraged starting prescribed medication for overactive bladder. - Discussed vaginal estrogen cream for urinary symptoms.  Vaginal atrophy Symptoms of vaginal dryness and discomfort. Premarin cream prescribed but not started. Discussed benefits of vaginal estrogen cream. - Encouraged starting Premarin cream for vaginal atrophy. - Educated on application of vaginal estrogen cream for urinary symptoms.  Screening for malignant neoplasm of colon Screening for colon cancer discussed. Declined colonoscopy, agreed to Cologuard. Discussed necessity of colonoscopy if Cologuard is positive. - Ordered Cologuard for colon cancer screening.       -USPSTF grade A and B recommendations reviewed with patient; age-appropriate recommendations, preventive care, screening tests, etc discussed and encouraged; healthy living encouraged; see AVS for patient education given to patient -Discussed importance of 150 minutes of physical activity weekly, eat two servings of fish weekly, eat one serving of tree nuts ( cashews, pistachios, pecans, almonds.SABRA) every other day, eat 6 servings of fruit/vegetables daily and drink plenty of water and avoid sweet beverages.   -Reviewed Health Maintenance: Yes.

## 2024-09-20 ENCOUNTER — Ambulatory Visit: Payer: Self-pay | Admitting: Family Medicine

## 2024-09-20 LAB — CBC WITH DIFFERENTIAL/PLATELET
Absolute Lymphocytes: 2263 {cells}/uL (ref 850–3900)
Absolute Monocytes: 431 {cells}/uL (ref 200–950)
Basophils Absolute: 58 {cells}/uL (ref 0–200)
Basophils Relative: 0.8 %
Eosinophils Absolute: 248 {cells}/uL (ref 15–500)
Eosinophils Relative: 3.4 %
HCT: 44.3 % (ref 35.0–45.0)
Hemoglobin: 14.4 g/dL (ref 11.7–15.5)
MCH: 28.8 pg (ref 27.0–33.0)
MCHC: 32.5 g/dL (ref 32.0–36.0)
MCV: 88.6 fL (ref 80.0–100.0)
MPV: 10 fL (ref 7.5–12.5)
Monocytes Relative: 5.9 %
Neutro Abs: 4300 {cells}/uL (ref 1500–7800)
Neutrophils Relative %: 58.9 %
Platelets: 324 Thousand/uL (ref 140–400)
RBC: 5 Million/uL (ref 3.80–5.10)
RDW: 12 % (ref 11.0–15.0)
Total Lymphocyte: 31 %
WBC: 7.3 Thousand/uL (ref 3.8–10.8)

## 2024-09-20 LAB — COMPREHENSIVE METABOLIC PANEL WITH GFR
AG Ratio: 2 (calc) (ref 1.0–2.5)
ALT: 24 U/L (ref 6–29)
AST: 17 U/L (ref 10–35)
Albumin: 4.5 g/dL (ref 3.6–5.1)
Alkaline phosphatase (APISO): 86 U/L (ref 37–153)
BUN: 16 mg/dL (ref 7–25)
CO2: 28 mmol/L (ref 20–32)
Calcium: 9.7 mg/dL (ref 8.6–10.4)
Chloride: 104 mmol/L (ref 98–110)
Creat: 0.62 mg/dL (ref 0.50–1.03)
Globulin: 2.2 g/dL (ref 1.9–3.7)
Glucose, Bld: 127 mg/dL — ABNORMAL HIGH (ref 65–99)
Potassium: 4.7 mmol/L (ref 3.5–5.3)
Sodium: 139 mmol/L (ref 135–146)
Total Bilirubin: 0.5 mg/dL (ref 0.2–1.2)
Total Protein: 6.7 g/dL (ref 6.1–8.1)
eGFR: 103 mL/min/1.73m2 (ref >=60)

## 2024-09-20 LAB — HEMOGLOBIN A1C
Hgb A1c MFr Bld: 6.6 % — ABNORMAL HIGH (ref <5.7)
Mean Plasma Glucose: 143 mg/dL
eAG (mmol/L): 7.9 mmol/L

## 2024-09-20 LAB — LIPID PANEL
Cholesterol: 225 mg/dL — ABNORMAL HIGH (ref <200)
HDL: 50 mg/dL (ref >=50)
LDL Cholesterol (Calc): 140 mg/dL — ABNORMAL HIGH
Non-HDL Cholesterol (Calc): 175 mg/dL — ABNORMAL HIGH (ref <130)
Total CHOL/HDL Ratio: 4.5 (calc) (ref <5.0)
Triglycerides: 211 mg/dL — ABNORMAL HIGH (ref <150)

## 2024-09-20 LAB — HEPATITIS B SURFACE ANTIBODY,QUALITATIVE: Hep B S Ab: REACTIVE — AB

## 2024-09-20 LAB — MICROALBUMIN / CREATININE URINE RATIO
Creatinine, Urine: 125 mg/dL (ref 20–275)
Microalb Creat Ratio: 2 mg/g{creat} (ref <30)
Microalb, Ur: 0.3 mg/dL

## 2024-09-28 ENCOUNTER — Telehealth: Admitting: Physician Assistant

## 2024-09-28 ENCOUNTER — Encounter: Payer: Self-pay | Admitting: Family Medicine

## 2024-09-28 DIAGNOSIS — J069 Acute upper respiratory infection, unspecified: Secondary | ICD-10-CM | POA: Diagnosis not present

## 2024-09-28 DIAGNOSIS — B9689 Other specified bacterial agents as the cause of diseases classified elsewhere: Secondary | ICD-10-CM | POA: Diagnosis not present

## 2024-09-28 DIAGNOSIS — J4531 Mild persistent asthma with (acute) exacerbation: Secondary | ICD-10-CM | POA: Diagnosis not present

## 2024-09-28 MED ORDER — PREDNISONE 20 MG PO TABS: 40.0000 mg | ORAL_TABLET | Freq: Every day | ORAL | 0 refills | Status: AC

## 2024-09-28 MED ORDER — PROMETHAZINE-DM 6.25-15 MG/5ML PO SYRP: 5.0000 mL | ORAL_SOLUTION | Freq: Four times a day (QID) | ORAL | 0 refills | Status: AC | PRN

## 2024-09-28 MED ORDER — AZITHROMYCIN 250 MG PO TABS: ORAL_TABLET | ORAL | 0 refills | Status: AC

## 2024-09-28 NOTE — Progress Notes (Signed)
 Virtual Visit Consent   Yolanda Pace, you are scheduled for a virtual visit with a [facility] provider today. Just as with appointments in the office, your consent must be obtained to participate. Your consent will be active for this visit and any virtual visit you may have with one of our providers in the next 365 days. If you have a MyChart account, a copy of this consent can be sent to you electronically.  As this is a virtual visit, video technology does not allow for your provider to perform a traditional examination. This may limit your provider's ability to fully assess your condition. If your provider identifies any concerns that need to be evaluated in person or the need to arrange testing (such as labs, EKG, etc.), we will make arrangements to do so. Although advances in technology are sophisticated, we cannot ensure that it will always work on either your end or our end. If the connection with a video visit is poor, the visit may have to be switched to a telephone visit. With either a video or telephone visit, we are not always able to ensure that we have a secure connection.  By engaging in this virtual visit, you consent to the provision of healthcare and authorize for your insurance to be billed (if applicable) for the services provided during this visit. Depending on your insurance coverage, you may receive a charge related to this service.  I need to obtain your verbal consent now. Are you willing to proceed with your visit today? Yolanda Pace has provided verbal consent on 09/28/2024 for a virtual visit (video or telephone). Yolanda [name], PA-C  Date: 09/28/2024 12:40 PM   Virtual Visit via Video Note   I, Yolanda Pace, connected with  Yolanda Pace  ([id number], 06/03/1966) on 09/28/24 at 12:30 PM EST by a video-enabled telemedicine application and verified that I am speaking with the correct person using two identifiers.  Location: Patient: Virtual Visit Location  Patient: Home Provider: Virtual Visit Location Provider: Home Office   I discussed the limitations of evaluation and management by telemedicine and the availability of in person appointments. The patient expressed understanding and agreed to proceed.    History of Present Illness: Yolanda Pace is a 58 y.o. who identifies as a female who was assigned female at birth, and is being seen today for cough and congestion.  HPI: URI  This is a new problem. The current episode started 1 to 4 weeks ago (over 1.5 weeks). The problem has been gradually worsening. Maximum temperature: subjective. Associated symptoms include congestion, coughing (productive), diarrhea, headaches, rhinorrhea (mostly post nasal drainage), sinus pain, a sore throat (initial symptom) and wheezing. Pertinent negatives include no ear pain, nausea, plugged ear sensation or vomiting. She has tried inhaler use (dayquil) for the symptoms. The treatment provided no relief.     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   Major depression, recurrent, chronic 04/08/2022   Symptomatic varicose veins, left 03/19/2022   Palpitations 07/13/2021   Diabetes mellitus with microalbuminuria (HCC) 02/23/2021   Fibromyalgia 02/20/2021   Connective tissue disease 10/31/2020   Chronic shoulder pain 07/07/2020   Mild intermittent asthma without complication 07/07/2020   Traumatic incomplete tear of left rotator  cuff 04/23/2020   Arthralgia 08/03/2019   Gastric polyp    Benign neoplasm of transverse colon    Internal hemorrhoids    Malignant neoplasm of skin 06/19/2018   Chronic pain syndrome 06/19/2018   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    Kidney stone 02/20/2018   Renal lesion 02/20/2018   Benign hypertension 01/21/2018   Pelvic pain 12/26/2017   GERD (gastroesophageal reflux disease) 11/09/2017   Elevated beta-2 microglobulin 07/11/2017   Right thyroid  nodule 05/26/2017   Mild hypercholesterolemia 05/06/2017   Generalized osteoarthritis of hand 12/13/2016   Impingement syndrome of shoulder region, left 11/29/2016   Anxiety    Allergic rhinitis    Vitamin D  deficiency disease     Allergies:  Allergies  Allergen Reactions   Betadine [Povidone Iodine] Swelling, Dermatitis and Rash    Pt states site gets infected   Metrizamide Swelling    Noted in [facility] on 01/06/2012. Premedicate patient prior to contrast media injections.   Other     [place]: vomiting    Salicylic Acid-Sulfur     Burst eye vessels   Bee Pollen     Sneezing, watery eyes, coughing.    Meloxicam  Itching   Pollen Extract Other (See Comments)    Sneezing, watery eyes, coughing.    Sulindac Anxiety and Other (See Comments)   Compazine  [Prochlorperazine] Other (See Comments)    Jaws locked up   Contrast Media [Iodinated Contrast Media] Swelling    Noted in [facility] chart on 01/06/2012. Premedicate patient prior to contrast media injections.   Doxycycline Nausea Only    GI upset   Gabapentin Nausea And Vomiting    Lip numbness    Keppra  [Levetiracetam ]     Pt was not herself    Lyrica  [Pregabalin ]     Tingling, depression, blister mouth    Macrobid  [Nitrofurantoin ] Other (See Comments)    Chest tightness, chills, nausea   Rosuvastatin      Mental fogginess, confusion, fatigue    Codeine Nausea And Vomiting   Hydroxychloroquine Nausea And Vomiting and Nausea Only    Ear clicking   Iodine Swelling   Sulfa  Antibiotics Nausea And Vomiting and Other (See Comments)   Medications:  Current Outpatient Medications:    azithromycin (ZITHROMAX) 250 MG tablet, Take 2 tablets on day 1, then 1 tablet daily on days 2 through 5, Disp: 6  tablet, Rfl: 0   predniSONE  (DELTASONE ) 20 MG tablet, Take 2 tablets (40 mg total) by mouth daily with breakfast., Disp: 14 tablet, Rfl: 0   promethazine -dextromethorphan (PROMETHAZINE -DM) 6.25-15 MG/5ML syrup, Take 5 mLs by mouth 4 (four) times daily as needed., Disp: 118 mL, Rfl: 0   acetaminophen  (TYLENOL ) 500 MG tablet, Take 1 tablet (500 mg total) by mouth every 6 (six) hours as needed for moderate pain (pain score 4-6)., Disp: 120 tablet, Rfl: 1   albuterol  (PROVENTIL ) (2.5 MG/3ML) 0.083% nebulizer solution, Take 3 mLs (2.5 mg total) by nebulization every 4 (four) hours as needed for wheezing or shortness of breath., Disp: 75 mL, Rfl: 1   blood glucose meter kit and supplies, Dispense based on patient and insurance preference. Use up to four times daily as directed. (FOR ICD-10 E10.9, E11.9)., Disp: 1 each, Rfl: 0   budesonide -formoterol  (SYMBICORT ) 160-4.5 MCG/ACT inhaler, Inhale 2 puffs into the lungs 2 (two) times daily., Disp: 1 each, Rfl: 3   Cholecalciferol (VITAMIN D ) 2000 units tablet, Take 2,000 Units by mouth daily. , Disp: , Rfl:  Crisaborole  (EUCRISA ) 2 % OINT, Apply twice daily to affected areas on face, Disp: 60 g, Rfl: 1   DULoxetine  (CYMBALTA ) 30 MG capsule, Take 1 capsule (30 mg total) by mouth daily., Disp: 90 capsule, Rfl: 1   estradiol (ESTRACE) 0.01 % CREA vaginal cream, Insert fingertip unit amount (0.5) into the vagina and around the urethra nightly x 2 weeks, then 2-3 times weekly for maintenance (Patient not taking: Reported on 09/19/2024), Disp: , Rfl:    hydrocortisone  2.5 % cream, Apply 1-2 times daily for up to 1 week to affected areas of rash., Disp: 30 g, Rfl: 1   ibuprofen (ADVIL) 600 MG tablet, Take 1 tablet (600 mg total) by mouth every 8 (eight) hours as needed., Disp: 90 tablet, Rfl: 1   Ivermectin  1 % CREA, Apply compounded mix to face QAM., Disp: 30 g, Rfl: 11   ketoconazole  (NIZORAL ) 2 % cream, Apply twice daily to affected areas on face., Disp: 30 g, Rfl:  2   levocetirizine (XYZAL ) 5 MG tablet, Take 1 tablet (5 mg total) by mouth every evening., Disp: 90 tablet, Rfl: 1   montelukast  (SINGULAIR ) 10 MG tablet, Take 1 tablet (10 mg total) by mouth at bedtime., Disp: 90 tablet, Rfl: 1   olmesartan  (BENICAR ) 40 MG tablet, Take 1 tablet (40 mg total) by mouth daily., Disp: 90 tablet, Rfl: 1   omeprazole  (PRILOSEC) 20 MG capsule, Take 1 capsule (20 mg total) by mouth every morning., Disp: 90 capsule, Rfl: 1   Roflumilast  (ZORYVE ) 0.3 % CREA, Apply to affected areas face once daily until improved., Disp: 60 g, Rfl: 1   Ruxolitinib Phosphate  (OPZELURA ) 1.5 % CREA, APPLY TO AFFECTED AREAS OF ECZEMA ONCE DAILY AS NEEDED. 60 gm for 30 day supply, Disp: 60 g, Rfl: 1   Semaglutide  (RYBELSUS ) 3 MG TABS, Take 1 tablet (3 mg total) by mouth daily., Disp: 30 tablet, Rfl: 0  Observations/Objective: Patient is well-developed, well-nourished in no acute distress.  Resting comfortably at home.  Head is normocephalic, atraumatic.  No labored breathing.  Speech is clear and coherent with logical content.  Patient is alert and oriented at baseline.    Assessment and Plan: 1. Bacterial upper respiratory infection (Primary) - promethazine -dextromethorphan (PROMETHAZINE -DM) 6.25-15 MG/5ML syrup; Take 5 mLs by mouth 4 (four) times daily as needed.  Dispense: 118 mL; Refill: 0 - predniSONE  (DELTASONE ) 20 MG tablet; Take 2 tablets (40 mg total) by mouth daily with breakfast.  Dispense: 14 tablet; Refill: 0 - azithromycin (ZITHROMAX) 250 MG tablet; Take 2 tablets on day 1, then 1 tablet daily on days 2 through 5  Dispense: 6 tablet; Refill: 0  2. Mild persistent asthma with exacerbation - predniSONE  (DELTASONE ) 20 MG tablet; Take 2 tablets (40 mg total) by mouth daily with breakfast.  Dispense: 14 tablet; Refill: 0  - Worsening over a week despite OTC medications - Will treat with Z-pack and Promethazine  DM - Can continue Mucinex (PLAIN) during the daytime - Continue  Albuterol  as needed - Add Prednisone  for asthma exacerbation - Push fluids.  - Rest.  - Steam and humidifier can help - Seek in person evaluation if worsening or symptoms fail to improve    Follow Up Instructions: I discussed the assessment and treatment plan with the patient. The patient was provided an opportunity to ask questions and all were answered. The patient agreed with the plan and demonstrated an understanding of the instructions.  A copy of instructions were sent to the patient via MyChart unless  otherwise noted below.    The patient was advised to call back or seek an in-person evaluation if the symptoms worsen or if the condition fails to improve as anticipated.    Yolanda [name], PA-C

## 2024-09-28 NOTE — Patient Instructions (Signed)
 Yolanda Pace, thank you for joining [facility] Yolanda Dickinson, PA-C for today's virtual visit.  While this provider is not your primary care provider (PCP), if your PCP is located in our provider database this encounter information will be shared with them immediately following your visit.   A [facility] MyChart account gives you access to today's visit and all your visits, tests, and labs performed at [facility]  click here if you don't have a [facility] MyChart account or go to [email or web address]  Consent: (Patient) Yolanda Pace provided verbal consent for this virtual visit at the beginning of the encounter.  Current Medications:  Current Outpatient Medications:    azithromycin (ZITHROMAX) 250 MG tablet, Take 2 tablets on day 1, then 1 tablet daily on days 2 through 5, Disp: 6 tablet, Rfl: 0   predniSONE  (DELTASONE ) 20 MG tablet, Take 2 tablets (40 mg total) by mouth daily with breakfast., Disp: 14 tablet, Rfl: 0   promethazine -dextromethorphan (PROMETHAZINE -DM) 6.25-15 MG/5ML syrup, Take 5 mLs by mouth 4 (four) times daily as needed., Disp: 118 mL, Rfl: 0   acetaminophen  (TYLENOL ) 500 MG tablet, Take 1 tablet (500 mg total) by mouth every 6 (six) hours as needed for moderate pain (pain score 4-6)., Disp: 120 tablet, Rfl: 1   albuterol  (PROVENTIL ) (2.5 MG/3ML) 0.083% nebulizer solution, Take 3 mLs (2.5 mg total) by nebulization every 4 (four) hours as needed for wheezing or shortness of breath., Disp: 75 mL, Rfl: 1   blood glucose meter kit and supplies, Dispense based on patient and insurance preference. Use up to four times daily as directed. (FOR ICD-10 E10.9, E11.9)., Disp: 1 each, Rfl: 0   budesonide -formoterol  (SYMBICORT ) 160-4.5 MCG/ACT inhaler, Inhale 2 puffs into the lungs 2 (two) times daily., Disp: 1 each, Rfl: 3   Cholecalciferol (VITAMIN D ) 2000 units tablet, Take 2,000 Units by mouth daily. , Disp: , Rfl:    Crisaborole  (EUCRISA ) 2 % OINT, Apply twice daily to  affected areas on face, Disp: 60 g, Rfl: 1   DULoxetine  (CYMBALTA ) 30 MG capsule, Take 1 capsule (30 mg total) by mouth daily., Disp: 90 capsule, Rfl: 1   estradiol (ESTRACE) 0.01 % CREA vaginal cream, Insert fingertip unit amount (0.5) into the vagina and around the urethra nightly x 2 weeks, then 2-3 times weekly for maintenance (Patient not taking: Reported on 09/19/2024), Disp: , Rfl:    hydrocortisone  2.5 % cream, Apply 1-2 times daily for up to 1 week to affected areas of rash., Disp: 30 g, Rfl: 1   ibuprofen (ADVIL) 600 MG tablet, Take 1 tablet (600 mg total) by mouth every 8 (eight) hours as needed., Disp: 90 tablet, Rfl: 1   Ivermectin  1 % CREA, Apply compounded mix to face QAM., Disp: 30 g, Rfl: 11   ketoconazole  (NIZORAL ) 2 % cream, Apply twice daily to affected areas on face., Disp: 30 g, Rfl: 2   levocetirizine (XYZAL ) 5 MG tablet, Take 1 tablet (5 mg total) by mouth every evening., Disp: 90 tablet, Rfl: 1   montelukast  (SINGULAIR ) 10 MG tablet, Take 1 tablet (10 mg total) by mouth at bedtime., Disp: 90 tablet, Rfl: 1   olmesartan  (BENICAR ) 40 MG tablet, Take 1 tablet (40 mg total) by mouth daily., Disp: 90 tablet, Rfl: 1   omeprazole  (PRILOSEC) 20 MG capsule, Take 1 capsule (20 mg total) by mouth every morning., Disp: 90 capsule, Rfl: 1   Roflumilast  (ZORYVE ) 0.3 % CREA, Apply to affected areas face once daily until improved.,  Disp: 60 g, Rfl: 1   Ruxolitinib Phosphate  (OPZELURA ) 1.5 % CREA, APPLY TO AFFECTED AREAS OF ECZEMA ONCE DAILY AS NEEDED. 60 gm for 30 day supply, Disp: 60 g, Rfl: 1   Semaglutide  (RYBELSUS ) 3 MG TABS, Take 1 tablet (3 mg total) by mouth daily., Disp: 30 tablet, Rfl: 0   Medications ordered in this encounter:  Meds ordered this encounter  Medications   promethazine -dextromethorphan (PROMETHAZINE -DM) 6.25-15 MG/5ML syrup    Sig: Take 5 mLs by mouth 4 (four) times daily as needed.    Dispense:  118 mL    Refill:  0    Supervising Provider:   BLAISE ALEENE KIDD  [[id number]]   predniSONE  (DELTASONE ) 20 MG tablet    Sig: Take 2 tablets (40 mg total) by mouth daily with breakfast.    Dispense:  14 tablet    Refill:  0    Supervising Provider:   LAMPTEY, PHILIP O [[id number]]   azithromycin (ZITHROMAX) 250 MG tablet    Sig: Take 2 tablets on day 1, then 1 tablet daily on days 2 through 5    Dispense:  6 tablet    Refill:  0    Supervising Provider:   LAMPTEY, PHILIP O [[id number]]     *If you need refills on other medications prior to your next appointment, please contact your pharmacy*  Follow-Up: Call back or seek an in-person evaluation if the symptoms worsen or if the condition fails to improve as anticipated.  [facility] [phone]  Other Instructions Upper Respiratory Infection, Adult An upper respiratory infection (URI) is a common viral infection of the nose, throat, and upper air passages that lead to the lungs. The most common type of URI is the common cold. URIs usually get better on their own, without medical treatment. What are the causes? A URI is caused by a virus. You may catch a virus by: Breathing in droplets from an infected person's cough or sneeze. Touching something that has been exposed to the virus (is contaminated) and then touching your mouth, nose, or eyes. What increases the risk? You are more likely to get a URI if: You are very young or very old. You have close contact with others, such as at work, school, or a health care facility. You smoke. You have long-term (chronic) heart or lung disease. You have a weakened disease-fighting system (immune system). You have nasal allergies or asthma. You are experiencing a lot of stress. You have poor nutrition. What are the signs or symptoms? A URI usually involves some of the following symptoms: Runny or stuffy (congested) nose. Cough. Sneezing. Sore throat. Headache. Fatigue. Fever. Loss of appetite. Pain in your forehead, behind your eyes, and over  your cheekbones (sinus pain). Muscle aches. Redness or irritation of the eyes. Pressure in the ears or face. How is this diagnosed? This condition may be diagnosed based on your medical history and symptoms, and a physical exam. Your health care provider may use a swab to take a mucus sample from your nose (nasal swab). This sample can be tested to determine what virus is causing the illness. How is this treated? URIs usually get better on their own within 7-10 days. Medicines cannot cure URIs, but your health care provider may recommend certain medicines to help relieve symptoms, such as: Over-the-counter cold medicines. Cough suppressants. Coughing is a type of defense against infection that helps to clear the respiratory system, so take these medicines only as recommended by your health  care provider. Fever-reducing medicines. Follow these instructions at home: Activity Rest as needed. If you have a fever, stay home from work or school until your fever is gone or until your health care provider says your URI cannot spread to other people (is no longer contagious). Your health care provider may have you wear a face mask to prevent your infection from spreading. Relieving symptoms Gargle with a mixture of salt and water 3-4 times a day or as needed. To make salt water, completely dissolve -1 tsp (3-6 g) of salt in 1 cup (237 mL) of warm water. Use a cool-mist humidifier to add moisture to the air. This can help you breathe more easily. Eating and drinking  Drink enough fluid to keep your urine pale yellow. Eat soups and other clear broths. General instructions  Take over-the-counter and prescription medicines only as told by your health care provider. These include cold medicines, fever reducers, and cough suppressants. Do not use any products that contain nicotine or tobacco. These products include cigarettes, chewing tobacco, and vaping devices, such as e-cigarettes. If you need help  quitting, ask your health care provider. Stay away from secondhand smoke. Stay up to date on all immunizations, including the yearly (annual) flu vaccine. Keep all follow-up visits. This is important. How to prevent the spread of infection to others URIs can be contagious. To prevent the infection from spreading: Wash your hands with soap and water for at least 20 seconds. If soap and water are not available, use hand sanitizer. Avoid touching your mouth, face, eyes, or nose. Cough or sneeze into a tissue or your sleeve or elbow instead of into your hand or into the air.  Contact a health care provider if: You are getting worse instead of better. You have a fever or chills. Your mucus is brown or red. You have yellow or brown discharge coming from your nose. You have pain in your face, especially when you bend forward. You have swollen neck glands. You have pain while swallowing. You have white areas in the back of your throat. Get help right away if: You have shortness of breath that gets worse. You have severe or persistent: Headache. Ear pain. Sinus pain. Chest pain. You have chronic lung disease along with any of the following: Making high-pitched whistling sounds when you breathe, most often when you breathe out (wheezing). Prolonged cough (more than 14 days). Coughing up blood. A change in your usual mucus. You have a stiff neck. You have changes in your: Vision. Hearing. Thinking. Mood. These symptoms may be an emergency. Get help right away. Call 911. Do not wait to see if the symptoms will go away. Do not drive yourself to the hospital. Summary An upper respiratory infection (URI) is a common infection of the nose, throat, and upper air passages that lead to the lungs. A URI is caused by a virus. URIs usually get better on their own within 7-10 days. Medicines cannot cure URIs, but your health care provider may recommend certain medicines to help relieve  symptoms. This information is not intended to replace advice given to you by your health care provider. Make sure you discuss any questions you have with your health care provider. Document Revised: 06/03/2021 Document Reviewed: 06/03/2021 Elsevier Patient Education  2024 [employer]   If you have been instructed to have an in-person evaluation today at a local Urgent Care facility, please use the link below. It will take you to a list of all of our  available [facility], including address, phone number and hours of operation. Please do not delay care.  [facility]  If you or a family member do not have a primary care provider, use the link below to schedule a visit and establish care. When you choose a [facility] primary care physician or advanced practice provider, you gain a long-term partner in health. Find a Primary Care Provider  Learn more about [facility]'s in-office and virtual care options:  - Get Care Now

## 2024-12-10 ENCOUNTER — Ambulatory Visit

## 2024-12-13 ENCOUNTER — Ambulatory Visit

## 2024-12-17 ENCOUNTER — Ambulatory Visit

## 2025-01-01 ENCOUNTER — Ambulatory Visit

## 2025-03-19 ENCOUNTER — Ambulatory Visit: Admitting: Family Medicine

## 2025-09-09 ENCOUNTER — Ambulatory Visit: Admitting: Dermatology

## 2025-09-23 ENCOUNTER — Encounter: Admitting: Family Medicine
